# Patient Record
Sex: Male | Born: 1975 | Race: Black or African American | Hispanic: No | Marital: Single | Smoking: Never smoker
Health system: Southern US, Community
[De-identification: ages and names within clinical notes are randomized; demographics above are authoritative.]

## PROBLEM LIST (undated history)

## (undated) DIAGNOSIS — E119 Type 2 diabetes mellitus without complications: Secondary | ICD-10-CM

## (undated) DIAGNOSIS — K8020 Calculus of gallbladder without cholecystitis without obstruction: Secondary | ICD-10-CM

## (undated) DIAGNOSIS — E66813 Obesity, class 3: Secondary | ICD-10-CM

## (undated) DIAGNOSIS — F329 Major depressive disorder, single episode, unspecified: Secondary | ICD-10-CM

## (undated) DIAGNOSIS — F32A Depression, unspecified: Secondary | ICD-10-CM

## (undated) DIAGNOSIS — I10 Essential (primary) hypertension: Secondary | ICD-10-CM

## (undated) HISTORY — DX: Depression, unspecified: F32.A

## (undated) HISTORY — DX: Morbid (severe) obesity due to excess calories: E66.01

## (undated) HISTORY — DX: Calculus of gallbladder without cholecystitis without obstruction: K80.20

## (undated) HISTORY — DX: Obesity, class 3: E66.813

## (undated) HISTORY — DX: Essential (primary) hypertension: I10

## (undated) HISTORY — DX: Major depressive disorder, single episode, unspecified: F32.9

---

## 2007-06-15 ENCOUNTER — Emergency Department (HOSPITAL_COMMUNITY): Admission: EM | Admit: 2007-06-15 | Discharge: 2007-06-15 | Payer: Self-pay | Admitting: *Deleted

## 2012-09-20 ENCOUNTER — Encounter (HOSPITAL_COMMUNITY): Payer: Self-pay | Admitting: Emergency Medicine

## 2012-09-20 ENCOUNTER — Emergency Department (HOSPITAL_COMMUNITY)
Admission: EM | Admit: 2012-09-20 | Discharge: 2012-09-20 | Disposition: A | Discharge status: Home / Self Care | Payer: Self-pay | Source: Home / Self Care | Attending: Family Medicine | Admitting: Family Medicine

## 2012-09-20 DIAGNOSIS — IMO0001 Reserved for inherently not codable concepts without codable children: Secondary | ICD-10-CM

## 2012-09-20 DIAGNOSIS — J111 Influenza due to unidentified influenza virus with other respiratory manifestations: Secondary | ICD-10-CM

## 2012-09-20 MED ORDER — IPRATROPIUM BROMIDE 0.06 % NA SOLN: 2.0000 | Freq: Four times a day (QID) | NASAL | Status: DC

## 2012-09-20 MED ORDER — AZITHROMYCIN 250 MG PO TABS: ORAL_TABLET | ORAL | Status: DC

## 2012-09-20 NOTE — ED Notes (Signed)
Sore throat, hoarse, cough and no fever.  Light yellow phlegm with flakes of blood per patient.

## 2012-09-20 NOTE — ED Provider Notes (Signed)
History     CSN: [id number]  Arrival date & time 09/20/12  1538   First MD Initiated Contact with Patient 09/20/12 1551      Chief Complaint  Patient presents with  . Sore Throat    (Consider location/radiation/quality/duration/timing/severity/associated sxs/prior treatment) Patient is a 37 y.o. male presenting with pharyngitis. The history is provided by the patient.  Sore Throat This is a new problem. The current episode started 2 days ago. The problem has been gradually improving. Pertinent negatives include no chest pain and no abdominal pain. The symptoms are aggravated by swallowing (talking.).    History reviewed. No pertinent past medical history.  History reviewed. No pertinent past surgical history.  No family history on file.  History  Substance Use Topics  . Smoking status: Never Smoker   . Smokeless tobacco: Not on file  . Alcohol Use: Yes      Review of Systems  Constitutional: Negative.  Negative for fever.  HENT: Positive for congestion, sore throat, rhinorrhea, trouble swallowing and voice change. Negative for sinus pressure.   Respiratory: Negative for cough.   Cardiovascular: Negative for chest pain.  Gastrointestinal: Negative for abdominal pain.    Allergies  Keflex  Home Medications   Current Outpatient Rx  Name  Route  Sig  Dispense  Refill  . ibuprofen (ADVIL,MOTRIN) 200 MG tablet   Oral   Take 200 mg by mouth every 6 (six) hours as needed for pain.         Marland Kitchen OVER THE COUNTER MEDICATION      Throat lozenges         . azithromycin (ZITHROMAX Z-PAK) 250 MG tablet      Take as directed on pack   6 each   0   . ipratropium (ATROVENT) 0.06 % nasal spray   Nasal   Place 2 sprays into the nose 4 (four) times daily.   15 mL   1     BP 129/82  Pulse 66  Temp(Src) 98 F (36.7 C) (Oral)  Resp 20  SpO2 98%  Physical Exam  Nursing note and vitals reviewed. Constitutional: He is oriented to person, place, and time. He  appears well-developed and well-nourished.  HENT:  Head: Normocephalic.  Right Ear: External ear normal.  Left Ear: External ear normal.  Nose: Nose normal.  Mouth/Throat: Oropharynx is clear and moist.  Eyes: Conjunctivae are normal. Pupils are equal, round, and reactive to light.  Neck: Normal range of motion. Neck supple.  Lymphadenopathy:    He has no cervical adenopathy.  Neurological: He is alert and oriented to person, place, and time.  Skin: Skin is warm and dry.    ED Course  Procedures (including critical care time)  Labs Reviewed - No data to display No results found.   1. Influenza, laryngitis       MDM         Linna Hoff, MD 09/20/12 1735

## 2015-08-25 ENCOUNTER — Ambulatory Visit (INDEPENDENT_AMBULATORY_CARE_PROVIDER_SITE_OTHER): Payer: Managed Care, Other (non HMO)

## 2015-08-25 ENCOUNTER — Other Ambulatory Visit: Payer: Self-pay | Admitting: Family Medicine

## 2015-08-25 ENCOUNTER — Ambulatory Visit (INDEPENDENT_AMBULATORY_CARE_PROVIDER_SITE_OTHER): Payer: Managed Care, Other (non HMO) | Admitting: Family Medicine

## 2015-08-25 VITALS — BP 128/80 | HR 91 | Temp 98.8°F | Resp 16 | Ht 73.0 in | Wt 319.0 lb

## 2015-08-25 DIAGNOSIS — R1032 Left lower quadrant pain: Secondary | ICD-10-CM

## 2015-08-25 DIAGNOSIS — K5900 Constipation, unspecified: Secondary | ICD-10-CM

## 2015-08-25 DIAGNOSIS — E860 Dehydration: Secondary | ICD-10-CM

## 2015-08-25 LAB — POC MICROSCOPIC URINALYSIS (UMFC): MUCUS RE: ABSENT

## 2015-08-25 LAB — POCT CBC
GRANULOCYTE PERCENT: 67.1 % (ref 37–80)
HEMATOCRIT: 42.6 % — AB (ref 43.5–53.7)
Hemoglobin: 14.9 g/dL (ref 14.1–18.1)
Lymph, poc: 2.1 (ref 0.6–3.4)
MCH, POC: 29.7 pg (ref 27–31.2)
MCHC: 34.9 g/dL (ref 31.8–35.4)
MCV: 85.1 fL (ref 80–97)
MID (cbc): 0.6 (ref 0–0.9)
MPV: 7.1 fL (ref 0–99.8)
POC GRANULOCYTE: 5.4 (ref 2–6.9)
POC LYMPH %: 25.4 % (ref 10–50)
POC MID %: 7.5 % (ref 0–12)
Platelet Count, POC: 311 10*3/uL (ref 142–424)
RBC: 5 M/uL (ref 4.69–6.13)
RDW, POC: 13.9 %
WBC: 8.1 10*3/uL (ref 4.6–10.2)

## 2015-08-25 LAB — POCT URINALYSIS DIP (MANUAL ENTRY)
BILIRUBIN UA: NEGATIVE
Blood, UA: NEGATIVE
GLUCOSE UA: NEGATIVE
Leukocytes, UA: NEGATIVE
Nitrite, UA: NEGATIVE
Urobilinogen, UA: 0.2
pH, UA: 5.5

## 2015-08-25 LAB — POC HEMOCCULT BLD/STL (OFFICE/1-CARD/DIAGNOSTIC): FECAL OCCULT BLD: NEGATIVE

## 2015-08-25 MED ORDER — POLYETHYLENE GLYCOL 3350 17 GM/SCOOP PO POWD: 17.0000 g | Freq: Two times a day (BID) | ORAL | Status: DC

## 2015-08-25 NOTE — Patient Instructions (Addendum)
IF you received an x-ray today, you will receive an invoice from [facility]. Please contact [facility] at [phone] with questions or concerns regarding your invoice.   IF you received labwork today, you will receive an invoice from [employer]. Please contact [facility] at [phone] with questions or concerns regarding your invoice.   Our billing staff will not be able to assist you with questions regarding bills from these companies.  You will be contacted with the lab results as soon as they are available. The fastest way to get your results is to activate your My Chart account. Instructions are located on the last page of this paperwork. If you have not heard from [place] regarding the results in 2 weeks, please contact this office.  Dehydration, Adult Dehydration is a condition in which you do not have enough fluid or water in your body. It happens when you take in less fluid than you lose. Vital organs such as the kidneys, brain, and heart cannot function without a proper amount of fluids. Any loss of fluids from the body can cause dehydration.  Dehydration can range from mild to severe. This condition should be treated right away to help prevent it from becoming severe. CAUSES  This condition may be caused by:  Vomiting.  Diarrhea.  Excessive sweating, such as when exercising in hot or humid weather.  Not drinking enough fluid during strenuous exercise or during an illness.  Excessive urine output.  Fever.  Certain medicines. RISK FACTORS This condition is more likely to develop in:  People who are taking certain medicines that cause the body to lose excess fluid (diuretics).   People who have a chronic illness, such as diabetes, that may increase urination.  Older adults.   People who live at high altitudes.   People who participate in endurance sports.  SYMPTOMS  Mild Dehydration  Thirst.  Dry lips.  Slightly dry  mouth.  Dry, warm skin. Moderate Dehydration  Very dry mouth.   Muscle cramps.   Dark urine and decreased urine production.   Decreased tear production.   Headache.   Light-headedness, especially when you stand up from a sitting position.  Severe Dehydration  Changes in skin.   Cold and clammy skin.   Skin does not spring back quickly when lightly pinched and released.   Changes in body fluids.   Extreme thirst.   No tears.   Not able to sweat when body temperature is high, such as in hot weather.   Minimal urine production.   Changes in vital signs.   Rapid, weak pulse (more than 100 beats per minute when you are sitting still).   Rapid breathing.   Low blood pressure.   Other changes.   Sunken eyes.   Cold hands and feet.   Confusion.  Lethargy and difficulty being awakened.  Fainting (syncope).   Short-term weight loss.   Unconsciousness. DIAGNOSIS  This condition may be diagnosed based on your symptoms. You may also have tests to determine how severe your dehydration is. These tests may include:   Urine tests.   Blood tests.  TREATMENT  Treatment for this condition depends on the severity. Mild or moderate dehydration can often be treated at home. Treatment should be started right away. Do not wait until dehydration becomes severe. Severe dehydration needs to be treated at the hospital. Treatment for Mild Dehydration  Drinking plenty of water to replace the fluid you have lost.   Replacing minerals in your blood (  electrolytes) that you may have lost.  Treatment for Moderate Dehydration  Consuming oral rehydration solution (ORS). Treatment for Severe Dehydration  Receiving fluid through an IV tube.   Receiving electrolyte solution through a feeding tube that is passed through your nose and into your stomach (nasogastric tube or NG tube).  Correcting any abnormalities in electrolytes. HOME CARE INSTRUCTIONS    Drink enough fluid to keep your urine clear or pale yellow.   Drink water or fluid slowly by taking small sips. You can also try sucking on ice cubes.  Have food or beverages that contain electrolytes. Examples include bananas and sports drinks.  Take over-the-counter and prescription medicines only as told by your health care provider.   Prepare ORS according to the manufacturer's instructions. Take sips of ORS every 5 minutes until your urine returns to normal.  If you have vomiting or diarrhea, continue to try to drink water, ORS, or both.   If you have diarrhea, avoid:   Beverages that contain caffeine.   Fruit juice.   Milk.   Carbonated soft drinks.  Do not take salt tablets. This can lead to the condition of having too much sodium in your body (hypernatremia).  SEEK MEDICAL CARE IF:  You cannot eat or drink without vomiting.  You have had moderate diarrhea during a period of more than 24 hours.  You have a fever. SEEK IMMEDIATE MEDICAL CARE IF:   You have extreme thirst.  You have severe diarrhea.  You have not urinated in 6-8 hours, or you have urinated only a small amount of very dark urine.  You have shriveled skin.  You are dizzy, confused, or both.   This information is not intended to replace advice given to you by your health care provider. Make sure you discuss any questions you have with your health care provider.   Document Released: 06/02/2005 Document Revised: 02/21/2015 Document Reviewed: 10/18/2014 Elsevier Interactive Patient Education 2016 [employer]. Constipation, Adult Constipation is when a person has fewer than three bowel movements a week, has difficulty having a bowel movement, or has stools that are dry, hard, or larger than normal. As people grow older, constipation is more common. A low-fiber diet, not taking in enough fluids, and taking certain medicines may make constipation worse.  CAUSES   Certain medicines, such  as antidepressants, pain medicine, iron supplements, antacids, and water pills.   Certain diseases, such as diabetes, irritable bowel syndrome (IBS), thyroid disease, or depression.   Not drinking enough water.   Not eating enough fiber-rich foods.   Stress or travel.   Lack of physical activity or exercise.   Ignoring the urge to have a bowel movement.   Using laxatives too much.  SIGNS AND SYMPTOMS   Having fewer than three bowel movements a week.   Straining to have a bowel movement.   Having stools that are hard, dry, or larger than normal.   Feeling full or bloated.   Pain in the lower abdomen.   Not feeling relief after having a bowel movement.  DIAGNOSIS  Your health care provider will take a medical history and perform a physical exam. Further testing may be done for severe constipation. Some tests may include:  A barium enema X-ray to examine your rectum, colon, and, sometimes, your small intestine.   A sigmoidoscopy to examine your lower colon.   A colonoscopy to examine your entire colon. TREATMENT  Treatment will depend on the severity of your constipation and what is causing  it. Some dietary treatments include drinking more fluids and eating more fiber-rich foods. Lifestyle treatments may include regular exercise. If these diet and lifestyle recommendations do not help, your health care provider may recommend taking over-the-counter laxative medicines to help you have bowel movements. Prescription medicines may be prescribed if over-the-counter medicines do not work.  HOME CARE INSTRUCTIONS   Eat foods that have a lot of fiber, such as fruits, vegetables, whole grains, and beans.  Limit foods high in fat and processed sugars, such as french fries, hamburgers, cookies, candies, and soda.   A fiber supplement may be added to your diet if you cannot get enough fiber from foods.   Drink enough fluids to keep your urine clear or pale yellow.    Exercise regularly or as directed by your health care provider.   Go to the restroom when you have the urge to go. Do not hold it.   Only take over-the-counter or prescription medicines as directed by your health care provider. Do not take other medicines for constipation without talking to your health care provider first.  Estelle IF:   You have bright red blood in your stool.   Your constipation lasts for more than 4 days or gets worse.   You have abdominal or rectal pain.   You have thin, pencil-like stools.   You have unexplained weight loss. MAKE SURE YOU:   Understand these instructions.  Will watch your condition.  Will get help right away if you are not doing well or get worse.   This information is not intended to replace advice given to you by your health care provider. Make sure you discuss any questions you have with your health care provider.   Document Released: 02/29/2004 Document Revised: 06/23/2014 Document Reviewed: 03/14/2013 Elsevier Interactive Patient Education [employer].

## 2015-08-25 NOTE — Progress Notes (Signed)
Subjective:  By signing my name below, I, Marc Long, attest that this documentation has been prepared under the direction and in the presence of [name], MD.  Electronically Signed: [name], ED Scribe. 08/25/2015. 4:46 PM.   Patient ID: Marc Long, male    DOB: 1976-02-20, 40 y.o.   MRN: [id number]  HPI Chief Complaint  Patient presents with  . Abdominal Pain    x4 days  . Depression    HPI Comments: Marc Long is a 40 y.o. male who presents to the [facility] complaining of LLQ abdominal pain that began 4 days ago. He reports initially oily, now darker colored stools. He has associated left flank pain with deep breathes. Pt takes probiotic, multivitamins and a dietary supplement, but no medication. He denies hx of abdominal surgery,  He denies urinary symptoms, nausea, vomiting,  decrease appetite, indigestion, heart burn and constipation.    Past Medical History  Diagnosis Date  . Depression    History reviewed. No pertinent past surgical history. Allergies  Allergen Reactions  . Keflex [Cephalexin]   . Relafen [Nabumetone]     Blood in stool   Prior to Admission medications   Medication Sig Start Date End Date Taking? Authorizing Provider  ibuprofen (ADVIL,MOTRIN) 200 MG tablet Take 200 mg by mouth every 6 (six) hours as needed for pain.    Historical Provider, MD  OVER THE COUNTER MEDICATION Throat lozenges    Historical Provider, MD   Social History   Social History  . Marital Status: Single    Spouse Name: N/A  . Number of Children: N/A  . Years of Education: N/A   Occupational History  . Not on file.   Social History Main Topics  . Smoking status: Never Smoker   . Smokeless tobacco: Not on file  . Alcohol Use: Yes  . Drug Use: No  . Sexual Activity: Not on file   Other Topics Concern  . Not on file   Social History Narrative   Review of Systems  Constitutional: Positive for fatigue. Negative for fever, chills and appetite  change.  Respiratory: Positive for chest tightness. Negative for cough, shortness of breath and wheezing.   Gastrointestinal: Positive for abdominal pain. Negative for nausea, vomiting, diarrhea, constipation, blood in stool, abdominal distention, anal bleeding and rectal pain.  Genitourinary: Positive for flank pain. Negative for dysuria, urgency, frequency, hematuria, difficulty urinating and genital sores.  Skin: Negative for color change and rash.  Hematological: Negative for adenopathy.  Psychiatric/Behavioral: Negative for sleep disturbance.   Objective:   Physical Exam  Constitutional: He is oriented to person, place, and time. He appears well-developed and well-nourished. No distress.  HENT:  Head: Normocephalic and atraumatic.  Eyes: Conjunctivae and EOM are normal.  Neck: Neck supple.  Cardiovascular: Normal rate, regular rhythm and normal heart sounds.   Pulmonary/Chest: Effort normal.  Abdominal: Bowel sounds are normal. He exhibits mass. There is tenderness in the left upper quadrant and left lower quadrant.  Poorly defined mobile masses.   Genitourinary: Rectum normal and prostate normal. Rectal exam shows no external hemorrhoid, no tenderness and anal tone normal. Guaiac negative stool. Prostate is not enlarged and not tender.  Rectal exam normal. Normal prostates. No stool in vault. Normal rectal tone.   Musculoskeletal: Normal range of motion.  No CVA tenderness.   Neurological: He is alert and oriented to person, place, and time.  Skin: Skin is warm and dry.  Psychiatric: He has a normal  mood and affect. His behavior is normal.  Nursing note and vitals reviewed.  Filed Vitals:   08/25/15 1631  BP: 128/80  Pulse: 91  Temp: 98.8 F (37.1 C)  TempSrc: Oral  Resp: 16  Height: 6\' 1"  (1.854 m)  Weight: 319 lb (144.697 kg)  SpO2: 97%   Results for orders placed or performed in visit on 08/25/15  Comprehensive metabolic panel  Result Value Ref Range   Sodium 143  135 - 146 mmol/L   Potassium 4.6 3.5 - 5.3 mmol/L   Chloride 104 98 - 110 mmol/L   CO2 29 20 - 31 mmol/L   Glucose, Bld 80 65 - 99 mg/dL   BUN 12 7 - 25 mg/dL   Creat 1.29 0.60 - 1.35 mg/dL   Total Bilirubin 0.6 0.2 - 1.2 mg/dL   Alkaline Phosphatase 56 40 - 115 U/L   AST 22 10 - 40 U/L   ALT 29 9 - 46 U/L   Total Protein 7.7 6.1 - 8.1 g/dL   Albumin 4.3 3.6 - 5.1 g/dL   Calcium 9.6 8.6 - 10.3 mg/dL  POCT CBC  Result Value Ref Range   WBC 8.1 4.6 - 10.2 K/uL   Lymph, poc 2.1 0.6 - 3.4   POC LYMPH PERCENT 25.4 10 - 50 %L   MID (cbc) 0.6 0 - 0.9   POC MID % 7.5 0 - 12 %M   POC Granulocyte 5.4 2 - 6.9   Granulocyte percent 67.1 37 - 80 %G   RBC 5.00 4.69 - 6.13 M/uL   Hemoglobin 14.9 14.1 - 18.1 g/dL   HCT, POC 42.6 (A) 43.5 - 53.7 %   MCV 85.1 80 - 97 fL   MCH, POC 29.7 27 - 31.2 pg   MCHC 34.9 31.8 - 35.4 g/dL   RDW, POC 13.9 %   Platelet Count, POC 311 142 - 424 K/uL   MPV 7.1 0 - 99.8 fL  POCT urinalysis dipstick  Result Value Ref Range   Color, UA yellow yellow   Clarity, UA cloudy (A) clear   Glucose, UA negative negative   Bilirubin, UA Long (A) negative   Ketones, POC UA negative negative   Spec Grav, UA >=1.030    Blood, UA negative negative   pH, UA 5.5    Protein Ur, POC =30 (A) negative   Urobilinogen, UA 0.2    Nitrite, UA Negative Negative   Leukocytes, UA Negative Negative  POCT Microscopic Urinalysis ([facility])  Result Value Ref Range   WBC,UR,HPF,POC Few (A) None WBC/hpf   RBC,UR,HPF,POC None None RBC/hpf   Bacteria Few (A) None, Too numerous to count   Mucus Absent Absent   Epithelial Cells, UR Per Microscopy Few (A) None, Too numerous to count cells/hpf  POC Hemoccult Bld/Stl (1-Cd Office Dx)  Result Value Ref Range   Card #1 Date 08/25/2015    Fecal Occult Blood, POC Negative Negative   Dg Abd 1 View  08/25/2015  CLINICAL DATA:  Abdominal pain EXAM: ABDOMEN - 1 VIEW COMPARISON:  None. FINDINGS: Nonobstructive bowel gas pattern. Visualized  osseous structures are within normal limits. IMPRESSION: Unremarkable abdominal radiograph. Electronically Signed   By: Julian Hy M.D.   On: 08/25/2015 17:15    Assessment & Plan:   1. Abdominal pain, left lower quadrant   2. Constipation, unspecified constipation type   3. Dehydration   UA, HOC, cbc, cmp, and KUB all normal/neg.  Urine does appear very concentrated so I am suspicious  that symptoms maybe coming from some underlying constipation and dehydration. However, pt states he usually drinks much more water than he has today alone. Pt is taking an unknown supplement for weightloss which maybe contributing. Increase fluids and treat constipation, recheck in 3d, sooner if worse. If sxs cont, may need CT  Orders Placed This Encounter  Procedures  . Comprehensive metabolic panel  . POCT CBC  . POCT urinalysis dipstick  . POCT Microscopic Urinalysis ([facility])  . POC Hemoccult Bld/Stl (1-Cd Office Dx)    Meds ordered this encounter  Medications  . polyethylene glycol powder (GLYCOLAX/MIRALAX) powder    Sig: Take 17 g by mouth 2 (two) times daily.    Dispense:  225 g    Refill:  1    I personally performed the services described in this documentation, which was scribed in my presence. The recorded information has been reviewed and considered, and addended by me as needed.  [name], MD MPH

## 2015-08-26 LAB — COMPREHENSIVE METABOLIC PANEL
ALT: 29 U/L (ref 9–46)
AST: 22 U/L (ref 10–40)
Albumin: 4.3 g/dL (ref 3.6–5.1)
Alkaline Phosphatase: 56 U/L (ref 40–115)
BUN: 12 mg/dL (ref 7–25)
CHLORIDE: 104 mmol/L (ref 98–110)
CO2: 29 mmol/L (ref 20–31)
CREATININE: 1.29 mg/dL (ref 0.60–1.35)
Calcium: 9.6 mg/dL (ref 8.6–10.3)
GLUCOSE: 80 mg/dL (ref 65–99)
Potassium: 4.6 mmol/L (ref 3.5–5.3)
SODIUM: 143 mmol/L (ref 135–146)
Total Bilirubin: 0.6 mg/dL (ref 0.2–1.2)
Total Protein: 7.7 g/dL (ref 6.1–8.1)

## 2015-08-27 ENCOUNTER — Encounter: Payer: Self-pay | Admitting: Family Medicine

## 2015-08-28 ENCOUNTER — Ambulatory Visit (INDEPENDENT_AMBULATORY_CARE_PROVIDER_SITE_OTHER): Payer: Managed Care, Other (non HMO) | Admitting: Family Medicine

## 2015-08-28 ENCOUNTER — Ambulatory Visit (INDEPENDENT_AMBULATORY_CARE_PROVIDER_SITE_OTHER): Payer: Managed Care, Other (non HMO)

## 2015-08-28 VITALS — BP 126/82 | HR 82 | Temp 97.9°F | Resp 17 | Ht 72.0 in | Wt 324.0 lb

## 2015-08-28 DIAGNOSIS — R1032 Left lower quadrant pain: Secondary | ICD-10-CM

## 2015-08-28 DIAGNOSIS — R1013 Epigastric pain: Secondary | ICD-10-CM | POA: Diagnosis not present

## 2015-08-28 DIAGNOSIS — M25561 Pain in right knee: Secondary | ICD-10-CM | POA: Diagnosis not present

## 2015-08-28 LAB — POCT URINALYSIS DIP (MANUAL ENTRY)
BILIRUBIN UA: NEGATIVE
BILIRUBIN UA: NEGATIVE
Blood, UA: NEGATIVE
Glucose, UA: NEGATIVE
Leukocytes, UA: NEGATIVE
Nitrite, UA: NEGATIVE
PROTEIN UA: NEGATIVE
SPEC GRAV UA: 1.025
Urobilinogen, UA: 0.2
pH, UA: 5.5

## 2015-08-28 LAB — POC MICROSCOPIC URINALYSIS (UMFC): MUCUS RE: ABSENT

## 2015-08-28 MED ORDER — OMEPRAZOLE 40 MG PO CPDR: 40.0000 mg | DELAYED_RELEASE_CAPSULE | Freq: Every day | ORAL | Status: DC

## 2015-08-28 MED ORDER — GI COCKTAIL ~~LOC~~
30.0000 mL | Freq: Once | ORAL | Status: AC
  Administered 2015-08-28: 30 mL via ORAL

## 2015-08-28 MED ORDER — MELOXICAM 7.5 MG PO TABS: 7.5000 mg | ORAL_TABLET | Freq: Two times a day (BID) | ORAL | Status: DC | PRN

## 2015-08-28 NOTE — Patient Instructions (Addendum)
IF you received an x-ray today, you will receive an invoice from [facility]. Please contact [facility] at [phone] with questions or concerns regarding your invoice.   IF you received labwork today, you will receive an invoice from [employer]. Please contact [facility] at [phone] with questions or concerns regarding your invoice.   Our billing staff will not be able to assist you with questions regarding bills from these companies.  You will be contacted with the lab results as soon as they are available. The fastest way to get your results is to activate your My Chart account. Instructions are located on the last page of this paperwork. If you have not heard from [place] regarding the results in 2 weeks, please contact this office.  Full Liquid Diet A full liquid diet may be used:   To help you transition from a clear liquid diet to a soft diet.   When your body is healing and can only tolerate foods that are easy to digest.  Before or after certain a procedure, test, or surgery (such as stomach or intestinal surgeries).   If you have trouble swallowing or chewing.  A full liquid diet includes fluids and foods that are liquid or will become liquid at room temperature. The full liquid diet gives you the proteins, fluids, salts, and minerals that you need for energy. If you continue this diet for more than 72 hours, talk to your health care provider about how many calories you need to consume. If you continue the diet for more than 5 days, talk to your health care provider about taking a multivitamin or a nutritional supplement. WHAT DO I NEED TO KNOW ABOUT A FULL LIQUID DIET?  You may have any liquid.  You may have any food that becomes a liquid at room temperature. The food is considered a liquid if it can be poured off a spoon at room temperature.  Drink one serving of citrus or vitamin C-enriched fruit juice daily. WHAT FOODS CAN I  EAT? Grains Any grain food that can be pureed in soup (such as crackers, pasta, and rice). Hot cereal (such as farina or oatmeal) that has been blended. Talk to your health care provider or dietitian about these foods. Vegetables Pulp-free tomato or vegetable juice. Vegetables pureed in soup.  Fruits Fruit juice, including nectars and juices with pulp. Meats and Other Protein Sources Eggs in custard, eggnog mix, and eggs used in ice cream or pudding. Strained meats, like in baby food, may be allowed. Consult your health care provider.  Dairy Milk and milk-based beverages, including milk shakes and instant breakfast mixes. Smooth yogurt. Pureed cottage cheese. Avoid these foods if they are not well tolerated. Beverages All beverages, including liquid nutritional supplements. Ask your health care provider if you can have carbonated beverages. They may not be well tolerated. Condiments Iodized salt, pepper, spices, and flavorings. Cocoa powder. Vinegar, ketchup, yellow mustard, smooth sauces (such as hollandaise, cheese sauce, or white sauce), and soy sauce. Sweets and Desserts Custard, smooth pudding. Flavored gelatin. Tapioca, junket. Plain ice cream, sherbet, fruit ices. Frozen ice pops, frozen fudge pops, pudding pops, and other frozen bars with cream. Syrups, including chocolate syrup. Sugar, honey, jelly.  Fats and Oils Margarine, butter, cream, sour cream, and oils. Other Broth and cream soups. Strained, broth-based soups. The items listed above may not be a complete list of recommended foods or beverages. Contact your dietitian for more options.  WHAT FOODS CAN I NOT EAT? Grains  All breads. Grains are not allowed unless they are pureed into soup. Vegetables Vegetables are not allowed unless they are juiced, or cooked and pureed into soup. Fruits Fruits are not allowed unless they are juiced. Meats and Other Protein Sources Any meat or fish. Cooked or raw eggs. Nut butters.   Dairy Cheese.  Condiments Stone ground mustards. Fats and Oils Fats that are coarse or chunky. Sweets and Desserts Ice cream or other frozen desserts that have any solids in them or on top, such as nuts, chocolate chips, and pieces of cookies. Cakes. Cookies. Candy. Others Soups with chunks or pieces in them. The items listed above may not be a complete list of foods and beverages to avoid. Contact your dietitian for more information.   This information is not intended to replace advice given to you by your health care provider. Make sure you discuss any questions you have with your health care provider.   Document Released: 06/02/2005 Document Revised: 06/07/2013 Document Reviewed: 04/07/2013 Elsevier Interactive Patient Education 2016 Elsevier Inc. Low-Fiber Diet Fiber is found in fruits, vegetables, and whole grains. A low-fiber diet restricts fibrous foods that are not digested in the small intestine. A diet containing about 10-15 grams of fiber per day is considered low fiber. Low-fiber diets may be used to:  Promote healing and rest the bowel during intestinal flare-ups.  Prevent blockage of a partially obstructed or narrowed gastrointestinal tract.  Reduce fecal weight and volume.  Slow the movement of feces. You may be on a low-fiber diet as a transitional diet following surgery, after an injury (trauma), or because of a short (acute) or lifelong (chronic) illness. Your health care provider will determine the length of time you need to stay on this diet.  WHAT DO I NEED TO KNOW ABOUT A LOW-FIBER DIET? Always check the fiber content on the packaging's Nutrition Facts label, especially on foods from the grains list. Ask your dietitian if you have questions about specific foods that are related to your condition, especially if the food is not listed below. In general, a low-fiber food will have less than 2 g of fiber. WHAT FOODS CAN I EAT? Grains All breads and crackers made  with white flour. Sweet rolls, doughnuts, waffles, pancakes, [place] toast, bagels. Pretzels, Melba toast, zwieback. Well-cooked cereals, such as cornmeal, farina, or cream cereals. Dry cereals that do not contain whole grains, fruit, or nuts, such as refined corn, wheat, rice, and oat cereals. Potatoes prepared any way without skins, plain pastas and noodles, refined white rice. Use white flour for baking and making sauces. Use allowed list of grains for casseroles, dumplings, and puddings.  Vegetables Strained tomato and vegetable juices. Fresh lettuce, cucumber, spinach. Well-cooked (no skin or pulp) or canned vegetables, such as asparagus, bean sprouts, beets, carrots, green beans, mushrooms, potatoes, pumpkin, spinach, yellow squash, tomato sauce/puree, turnips, yams, and zucchini. Keep servings limited to  cup.  Fruits All fruit juices except prune juice. Cooked or canned fruits without skin and seeds, such as applesauce, apricots, cherries, fruit cocktail, grapefruit, grapes, mandarin oranges, melons, peaches, pears, pineapple, and plums. Fresh fruits without skin, such as apricots, avocados, bananas, melons, pineapple, nectarines, and peaches. Keep servings limited to  cup or 1 piece.  Meat and Other Protein Sources Ground or well-cooked tender beef, ham, veal, lamb, pork, or poultry. Eggs, plain cheese. Fish, oysters, shrimp, lobster, and other seafood. Liver, organ meats. Smooth nut butters. Dairy All milk products and alternative dairy substitutes, such as soy, rice,  almond, and coconut, not containing added whole nuts, seeds, or added fruit. Beverages Decaf coffee, fruit, and vegetable juices or smoothies (small amounts, with no pulp or skins, and with fruits from allowed list), sports drinks, herbal tea. Condiments Ketchup, mustard, vinegar, cream sauce, cheese sauce, cocoa powder. Spices in moderation, such as allspice, basil, bay leaves, celery powder or leaves, cinnamon, cumin powder,  curry powder, ginger, mace, marjoram, onion or garlic powder, oregano, paprika, parsley flakes, ground pepper, rosemary, sage, savory, tarragon, thyme, and turmeric. Sweets and Desserts Plain cakes and cookies, pie made with allowed fruit, pudding, custard, cream pie. Gelatin, fruit, ice, sherbet, frozen ice pops. Ice cream, ice milk without nuts. Plain hard candy, honey, jelly, molasses, syrup, sugar, chocolate syrup, gumdrops, marshmallows. Limit overall sugar intake.  Fats and Oil Margarine, butter, cream, mayonnaise, salad oils, plain salad dressings made from allowed foods. Choose healthy fats such as olive oil, canola oil, and omega-3 fatty acids (such as found in salmon or tuna) when possible.  Other Bouillon, broth, or cream soups made from allowed foods. Any strained soup. Casseroles or mixed dishes made with allowed foods. The items listed above may not be a complete list of recommended foods or beverages. Contact your dietitian for more options.  WHAT FOODS ARE NOT RECOMMENDED? Grains All whole wheat and whole grain breads and crackers. Multigrains, rye, bran seeds, nuts, or coconut. Cereals containing whole grains, multigrains, bran, coconut, nuts, raisins. Cooked or dry oatmeal, steel-cut oats. Coarse wheat cereals, granola. Cereals advertised as high fiber. Potato skins. Whole grain pasta, wild or brown rice. Popcorn. Coconut flour. Bran, buckwheat, corn bread, multigrains, rye, wheat germ.  Vegetables Fresh, cooked or canned vegetables, such as artichokes, asparagus, beet greens, broccoli, Brussels sprouts, cabbage, celery, cauliflower, corn, eggplant, kale, legumes or beans, okra, peas, and tomatoes. Avoid large servings of any vegetables, especially raw vegetables.  Fruits Fresh fruits, such as apples with or without skin, berries, cherries, figs, grapes, grapefruit, guavas, kiwis, mangoes, oranges, papayas, pears, persimmons, pineapple, and pomegranate. Prune juice and juices with  pulp, stewed or dried prunes. Dried fruits, dates, raisins. Fruit seeds or skins. Avoid large servings of all fresh fruits. Meats and Other Protein Sources Tough, fibrous meats with gristle. Chunky nut butter. Cheese made with seeds, nuts, or other foods not recommended. Nuts, seeds, legumes (beans, including baked beans), dried peas, beans, lentils.  Dairy Yogurt or cheese that contains nuts, seeds, or added fruit.  Beverages Fruit juices with high pulp, prune juice. Caffeinated coffee and teas.  Condiments Coconut, maple syrup, pickles, olives. Sweets and Desserts Desserts, cookies, or candies that contain nuts or coconut, chunky peanut butter, dried fruits. Jams, preserves with seeds, marmalade. Large amounts of sugar and sweets. Any other dessert made with fruits from the not recommended list.  Other Soups made from vegetables that are not recommended or that contain other foods not recommended.  The items listed above may not be a complete list of foods and beverages to avoid. Contact your dietitian for more information.   This information is not intended to replace advice given to you by your health care provider. Make sure you discuss any questions you have with your health care provider.   Document Released: 11/22/2001 Document Revised: 06/07/2013 Document Reviewed: 04/25/2013 Elsevier Interactive Patient Education [employer].

## 2015-08-28 NOTE — Progress Notes (Signed)
Subjective:    Patient ID: Marc Long, male    DOB: Mar 30, 1976, 40 y.o.   MRN: [id number] By signing my name below, I, Marc Long, attest that this documentation has been prepared under the direction and in the presence of [name], MD.  Electronically Signed: Zola Long, Medical Scribe. 08/28/2015. 3:56 PM.  Chief Complaint  Patient presents with  . Follow-up    Same pain from previously, feeling of fullness  . Knee Pain    Right, X 1 month    HPI HPI Comments: Marc Long is a 40 y.o. male who presents to the [facility] for a follow-up for abdominal pain/fullness. Patient was last seen by me 3 days ago for LLQ abdominal pain and oily, dark colored stools. The pain has improved some, but has not resolved. His stools are not watery anymore, but they are still abnormal, described as "light brown and squishy." He is currently feeling gassy. He ate some pizza yesterday and states he was still feeling full 7 hours later. Patient has also been belching and notes he was coughing up bile this morning. Patient denies abdominal surgeries.  Patient also complains of right knee pain that started about 1 month ago. He has had problems in the knee before, but notes the pain worsened after he jumped from a small cliff-like area. When he gets down, he feels like he is putting weight on a nerve in the knee. He reports having associated numbness/tingling. The pain does not shoot when he is standing. Patient denies weakness. He also denies prior knee injuries. The knee has not locked up or given out.   Past Medical History  Diagnosis Date  . Depression    No past surgical history on file. Current Outpatient Prescriptions on File Prior to Visit  Medication Sig Dispense Refill  . polyethylene glycol powder (GLYCOLAX/MIRALAX) powder Take 17 g by mouth 2 (two) times daily. 225 g 1   No current facility-administered medications on file prior to visit.   Allergies  Allergen Reactions   . Keflex [Cephalexin]   . Relafen [Nabumetone]     Blood in stool   No family history on file. Social History   Social History  . Marital Status: Single    Spouse Name: N/A  . Number of Children: N/A  . Years of Education: N/A   Social History Main Topics  . Smoking status: Never Smoker   . Smokeless tobacco: None  . Alcohol Use: Yes  . Drug Use: No  . Sexual Activity: Not Asked   Other Topics Concern  . None   Social History Narrative   Depression screen [facility] 2/9 08/25/2015  Decreased Interest 2  Down, Depressed, Hopeless 2  PHQ - 2 Score 4  Altered sleeping 0  Tired, decreased energy 2  Change in appetite 2  Feeling bad or failure about yourself  3  Trouble concentrating 0  Moving slowly or fidgety/restless 1  Suicidal thoughts 3  PHQ-9 Score 15  Difficult doing work/chores Not difficult at all      Review of Systems  Constitutional: Positive for activity change, appetite change and fatigue.  Respiratory: Positive for cough.   Gastrointestinal: Positive for nausea, vomiting, abdominal pain, diarrhea, constipation, blood in stool and abdominal distention. Negative for anal bleeding.  Genitourinary: Negative for dysuria, urgency, decreased urine volume, difficulty urinating and testicular pain.  Musculoskeletal: Positive for arthralgias.  Neurological: Positive for numbness. Negative for weakness.  Psychiatric/Behavioral: Positive for suicidal ideas and  dysphoric mood. Negative for sleep disturbance and decreased concentration. The patient is nervous/anxious.        Objective:  BP 126/82 mmHg  Pulse 82  Temp(Src) 97.9 F (36.6 C) (Oral)  Resp 17  Ht 6' (1.829 m)  Wt 324 lb (146.965 kg)  BMI 43.93 kg/m2  SpO2 97%  Physical Exam  Constitutional: He is oriented to person, place, and time. He appears well-developed and well-nourished. No distress.  HENT:  Head: Normocephalic and atraumatic.  Mouth/Throat: Oropharynx is clear and moist. No oropharyngeal  exudate.  Eyes: Pupils are equal, round, and reactive to light.  Neck: Neck supple.  Cardiovascular: Normal rate.   Pulmonary/Chest: Effort normal.  Abdominal: There is tenderness.  Bowel sounds normal except for RUQ, which sounds tympanitic. Tenderness to LLQ and epigastric area.  Musculoskeletal: He exhibits no edema.  No pain along medial joint line. Some mild pain at lateral joint line. Patella intact. No crepitus, full ROM.  Neurological: He is alert and oriented to person, place, and time. No cranial nerve deficit.  Skin: Skin is warm and dry. No rash noted.  Psychiatric: He has a normal mood and affect. His behavior is normal.  Nursing note and vitals reviewed.  Results for orders placed or performed in visit on 08/28/15  POCT urinalysis dipstick  Result Value Ref Range   Color, UA yellow yellow   Clarity, UA clear clear   Glucose, UA negative negative   Bilirubin, UA negative negative   Ketones, POC UA negative negative   Spec Grav, UA 1.025    Blood, UA negative negative   pH, UA 5.5    Protein Ur, POC negative negative   Urobilinogen, UA 0.2    Nitrite, UA Negative Negative   Leukocytes, UA Negative Negative  POCT Microscopic Urinalysis ([facility])  Result Value Ref Range   WBC,UR,HPF,POC None None WBC/hpf   RBC,UR,HPF,POC None None RBC/hpf   Bacteria Few (A) None, Too numerous to count   Mucus Absent Absent   Epithelial Cells, UR Per Microscopy Few (A) None, Too numerous to count cells/hpf   Dg Knee 1-2 Views Right  08/28/2015  CLINICAL DATA:  Pain over lateral joint line after jumping injury several months ago. EXAM: RIGHT KNEE - 1-2 VIEW COMPARISON:  None. FINDINGS: AP and lateral views. No acute fracture or dislocation. Joint spaces are maintained for age. No joint effusion. IMPRESSION: No acute osseous abnormality. Electronically Signed   By: Abigail Miyamoto M.D.   On: 08/28/2015 17:17   Dg Abd 1 View  08/28/2015  CLINICAL DATA:  Follow-up for left lower quadrant  abdominal pain. EXAM: ABDOMEN - 1 VIEW COMPARISON:  Plain film of the abdomen dated 08/25/2015. FINDINGS: Visualized bowel gas pattern is nonobstructive. No evidence of soft tissue mass or abnormal fluid collection. No evidence of free intraperitoneal air. No pathologic- appearing calcifications seen. Osseous structures are unremarkable. IMPRESSION: Negative. Electronically Signed   By: Franki Cabot M.D.   On: 08/28/2015 16:36   Dg Abd 1 View  08/25/2015  CLINICAL DATA:  Abdominal pain EXAM: ABDOMEN - 1 VIEW COMPARISON:  None. FINDINGS: Nonobstructive bowel gas pattern. Visualized osseous structures are within normal limits. IMPRESSION: Unremarkable abdominal radiograph. Electronically Signed   By: Julian Hy M.D.   On: 08/25/2015 17:15       Assessment & Plan:   1. Abdominal pain, left lower quadrant - minimal sx improvement with miralax cleanout despite it being technically successful.  Same with GI cocktail in office today. Need CT  scan asap to eval for poss diverticulitis as cause of sxs. In the meantime, cont on full liquid diet and may slowly progress to low residue diet if sxs resolve.  2. Abdominal pain, epigastric   3. Knee pain, acute, right - mild OA/overuse - placed in brace with meloxicam    PT admits to SI on routine questioning - I did not see this while pt was in the office - need to discuss at f/u.  Orders Placed This Encounter  Procedures  . DG Knee 1-2 Views Right    Standing Status: Future     Number of Occurrences: 1     Standing Expiration Date: 08/27/2016    Order Specific Question:  Reason for Exam (SYMPTOM  OR DIAGNOSIS REQUIRED)    Answer:  pain over lateral joint line after jumping off of a small wall sev mos ago    Order Specific Question:  Preferred imaging location?    Answer:  External  . DG Abd 1 View    Standing Status: Future     Number of Occurrences: 1     Standing Expiration Date: 08/27/2016    Order Specific Question:  Reason for Exam (SYMPTOM   OR DIAGNOSIS REQUIRED)    Answer:  continued early satiety, regurg, bloating    Order Specific Question:  Preferred imaging location?    Answer:  External  . CT Abdomen Pelvis W Contrast    [facility]-323/NOT DIAB/NO KIDNEY PROBLEMS/NO NEEDS/NKDA TO CT IV [facility],EPIC ORDER     Standing Status: Future     Number of Occurrences:      Standing Expiration Date: 11/27/2016    Order Specific Question:  If indicated for the ordered procedure, I authorize the administration of contrast media per Radiology protocol    Answer:  Yes    Order Specific Question:  Reason for Exam (SYMPTOM  OR DIAGNOSIS REQUIRED)    Answer:  persistent left lower quandrant abdominal pain    Order Specific Question:  Preferred imaging location?    Answer:  [facility]  . POCT urinalysis dipstick  . POCT Microscopic Urinalysis ([facility])    Meds ordered this encounter  Medications  . gi cocktail (Maalox,Lidocaine,Donnatal)    Sig:   . meloxicam (MOBIC) 7.5 MG tablet    Sig: Take 1 tablet (7.5 mg total) by mouth 2 (two) times daily as needed for pain.    Dispense:  60 tablet    Refill:  1  . omeprazole (PRILOSEC) 40 MG capsule    Sig: Take 1 capsule (40 mg total) by mouth daily.    Dispense:  30 capsule    Refill:  1    I personally performed the services described in this documentation, which was scribed in my presence. The recorded information has been reviewed and considered, and addended by me as needed.  [name], MD MPH

## 2015-08-30 ENCOUNTER — Telehealth: Payer: Self-pay

## 2015-08-30 NOTE — Telephone Encounter (Signed)
I am out of the office and will not have the papers done until Mon 3/27 a.m. - if they are needed before then, please see if perhaps one of my colleagues can complete them on my behalf, thanks.

## 2015-08-30 NOTE — Telephone Encounter (Signed)
Patient needs FMLA forms completed by Dr Brigitte Pulse. I have filled out what I can from the [place] notes and highlighted the areas that need to completed. I will place in your box on 08/30/15 please fill out and return to the FMLA/Disability box at the [employer] desk within 5-7 business days. Thank You!!

## 2015-08-31 NOTE — Telephone Encounter (Signed)
The 27th will be fine, the papers are in your box in a folder!!

## 2015-09-05 ENCOUNTER — Ambulatory Visit
Admission: RE | Admit: 2015-09-05 | Discharge: 2015-09-05 | Disposition: A | Discharge status: Home / Self Care | Payer: Managed Care, Other (non HMO) | Source: Ambulatory Visit | Attending: Family Medicine | Admitting: Family Medicine

## 2015-09-05 DIAGNOSIS — R1032 Left lower quadrant pain: Secondary | ICD-10-CM

## 2015-09-05 DIAGNOSIS — R1013 Epigastric pain: Secondary | ICD-10-CM

## 2015-09-05 MED ORDER — IOPAMIDOL (ISOVUE-300) INJECTION 61%
100.0000 mL | Freq: Once | INTRAVENOUS | Status: AC | PRN
  Administered 2015-09-05: 100 mL via INTRAVENOUS

## 2015-09-07 ENCOUNTER — Ambulatory Visit (INDEPENDENT_AMBULATORY_CARE_PROVIDER_SITE_OTHER): Payer: Managed Care, Other (non HMO) | Admitting: Family Medicine

## 2015-09-07 VITALS — BP 114/80 | HR 72 | Temp 97.6°F | Resp 16 | Ht 72.0 in | Wt 323.0 lb

## 2015-09-07 DIAGNOSIS — K5730 Diverticulosis of large intestine without perforation or abscess without bleeding: Secondary | ICD-10-CM | POA: Diagnosis not present

## 2015-09-07 DIAGNOSIS — K8021 Calculus of gallbladder without cholecystitis with obstruction: Secondary | ICD-10-CM | POA: Diagnosis not present

## 2015-09-07 DIAGNOSIS — R1032 Left lower quadrant pain: Secondary | ICD-10-CM

## 2015-09-07 LAB — POCT CBC
Granulocyte percent: 71 %G (ref 37–80)
HEMATOCRIT: 43.5 % (ref 43.5–53.7)
HEMOGLOBIN: 14.9 g/dL (ref 14.1–18.1)
LYMPH, POC: 1.6 (ref 0.6–3.4)
MCH: 29.7 pg (ref 27–31.2)
MCHC: 34.3 g/dL (ref 31.8–35.4)
MCV: 86.7 fL (ref 80–97)
MID (cbc): 0.4 (ref 0–0.9)
MPV: 7.6 fL (ref 0–99.8)
POC GRANULOCYTE: 5.1 (ref 2–6.9)
POC LYMPH PERCENT: 22.8 %L (ref 10–50)
POC MID %: 6.2 % (ref 0–12)
Platelet Count, POC: 302 10*3/uL (ref 142–424)
RBC: 5.02 M/uL (ref 4.69–6.13)
RDW, POC: 12.9 %
WBC: 7.2 10*3/uL (ref 4.6–10.2)

## 2015-09-07 LAB — POCT URINALYSIS DIP (MANUAL ENTRY)
Bilirubin, UA: NEGATIVE
Glucose, UA: NEGATIVE
NITRITE UA: NEGATIVE
PH UA: 5.5
RBC UA: NEGATIVE
Spec Grav, UA: 1.025
Urobilinogen, UA: 0.2

## 2015-09-07 LAB — POC MICROSCOPIC URINALYSIS (UMFC)

## 2015-09-07 LAB — POCT SEDIMENTATION RATE: POCT SED RATE: 36 mm/h — AB (ref 0–22)

## 2015-09-07 MED ORDER — TRAMADOL HCL 50 MG PO TABS
50.0000 mg | ORAL_TABLET | Freq: Four times a day (QID) | ORAL | Status: DC | PRN
Start: 2015-09-07 — End: 2015-09-15

## 2015-09-07 MED ORDER — CIPROFLOXACIN HCL 750 MG PO TABS: 750.0000 mg | ORAL_TABLET | Freq: Two times a day (BID) | ORAL | Status: DC

## 2015-09-07 MED ORDER — METRONIDAZOLE 500 MG PO TABS: 500.0000 mg | ORAL_TABLET | Freq: Four times a day (QID) | ORAL | Status: DC

## 2015-09-07 NOTE — Telephone Encounter (Signed)
Completed while pt is in office

## 2015-09-07 NOTE — Progress Notes (Signed)
Subjective:    Patient ID: Marc Long, male    DOB: 06-03-1976, 40 y.o.   MRN: [id number] By signing my name below, I, [name], attest that this documentation has been prepared under the direction and in the presence of [name], MD.  Electronically Signed: [name], Medical Scribe. 09/07/2015. 3:39 PM.  Chief Complaint  Patient presents with  . Abdominal Pain  . Follow-up    HPI HPI Comments: Marc Long is a 40 y.o. male who presents to the [facility] for a follow-up for intermittent, sharp, LLQ abdominal pain that began 17 days ago. The pain has not improved and is worse when sitting up or laying down. The pain is worse during the day than at night and sometimes radiates to his back. Episodes of pain can last for 1 minute up to 45-60 minutes (waxing and waning). The pain is improved after bowel movements. Patient had a CT Abdomen Pelvis 2 days ago which revealed cholelithiasis (3.7 cm lamellated single gallstone) and colon diverticulosis without acute diverticulitis. He is taking Miralax once a day now. Patient denies blood in stool, fever, chills, nausea, vomiting, and urinary problems. His stool has been a light brown color. He has been out of work since 3/12. His appetite has been fine.  Past Medical History  Diagnosis Date  . Depression    History reviewed. No pertinent past surgical history. Current Outpatient Prescriptions on File Prior to Visit  Medication Sig Dispense Refill  . meloxicam (MOBIC) 7.5 MG tablet Take 1 tablet (7.5 mg total) by mouth 2 (two) times daily as needed for pain. 60 tablet 1  . omeprazole (PRILOSEC) 40 MG capsule Take 1 capsule (40 mg total) by mouth daily. 30 capsule 1  . polyethylene glycol powder (GLYCOLAX/MIRALAX) powder Take 17 g by mouth 2 (two) times daily. 225 g 1   No current facility-administered medications on file prior to visit.   Allergies  Allergen Reactions  . Keflex [Cephalexin]   . Relafen [Nabumetone]       Blood in stool   History reviewed. No pertinent family history. Social History   Social History  . Marital Status: Single    Spouse Name: N/A  . Number of Children: N/A  . Years of Education: N/A   Social History Main Topics  . Smoking status: Never Smoker   . Smokeless tobacco: Never Used  . Alcohol Use: 0.0 oz/week    0 Standard drinks or equivalent per week  . Drug Use: No  . Sexual Activity: Not Asked   Other Topics Concern  . None   Social History Narrative    Review of Systems  Constitutional: Positive for activity change and appetite change. Negative for fever, chills, fatigue and unexpected weight change.  Respiratory: Negative for cough, chest tightness and shortness of breath.   Gastrointestinal: Positive for abdominal pain and constipation. Negative for nausea, vomiting, diarrhea, blood in stool, abdominal distention, anal bleeding and rectal pain.  Genitourinary: Negative.   Musculoskeletal: Positive for back pain.  Skin: Negative for rash and wound.  Allergic/Immunologic: Negative for immunocompromised state.  Neurological: Negative for dizziness, light-headedness and headaches.  Hematological: Negative for adenopathy.  Psychiatric/Behavioral: Positive for sleep disturbance.       Objective:  BP 114/80 mmHg  Pulse 72  Temp(Src) 97.6 F (36.4 C) (Oral)  Resp 16  Ht 6' (1.829 m)  Wt 323 lb (146.512 kg)  BMI 43.80 kg/m2  SpO2 98%  Physical Exam  Constitutional: He  is oriented to person, place, and time. He appears well-developed and well-nourished. No distress.  HENT:  Head: Normocephalic and atraumatic.  Mouth/Throat: Oropharynx is clear and moist. No oropharyngeal exudate.  Eyes: Pupils are equal, round, and reactive to light.  Neck: Neck supple.  Cardiovascular: Normal rate.   Pulmonary/Chest: Effort normal and breath sounds normal. No respiratory distress. He has no wheezes. He has no rales.  Clear to auscultation bilaterally. Good air  movement.   Abdominal: Bowel sounds are normal. There is tenderness in the left lower quadrant. There is no tenderness at McBurney's point and negative Murphy's sign.  TTP localizes to left lower quadrant.  Musculoskeletal: He exhibits no edema.  Neurological: He is alert and oriented to person, place, and time. No cranial nerve deficit.  Skin: Skin is warm and dry. No rash noted.  Psychiatric: He has a normal mood and affect. His behavior is normal.  Nursing note and vitals reviewed.       Assessment & Plan:   1. Calculus of gallbladder with biliary obstruction but without cholecystitis - refer to gen surg - unlikely that 3.7 cm gallstone has anything to do w/ pt's presenting and continuing c/o LLQ pain.    2. Abdominal pain, left lower quadrant - no etiology seen on CT scan so will refer to GI and start empiric trial for infectious colitis.  3. Diverticulosis of colon without hemorrhage     Orders Placed This Encounter  Procedures  . Urine culture  . Comprehensive metabolic panel  . Ambulatory referral to General Surgery    Referral Priority:  Urgent    Referral Type:  Surgical    Referral Reason:  Specialty Services Required    Requested Specialty:  General Surgery    Number of Visits Requested:  1  . POCT urinalysis dipstick  . POCT Microscopic Urinalysis ([facility])  . POCT CBC  . POCT SEDIMENTATION RATE    Meds ordered this encounter  Medications  . Probiotic Product (PROBIOTIC PO)    Sig: Take by mouth.  .  traMADol (ULTRAM) 50 MG tablet    Sig: Take 1 tablet (50 mg total) by mouth every 6 (six) hours as needed.    Dispense:  30 tablet    Refill:  1  . ciprofloxacin (CIPRO) 750 MG tablet    Sig: Take 1 tablet (750 mg total) by mouth 2 (two) times daily.    Dispense:  14 tablet    Refill:  0  . metroNIDAZOLE (FLAGYL) 500 MG tablet    Sig: Take 1 tablet (500 mg total) by mouth 4 (four) times daily.    Dispense:  28 tablet    Refill:  0    I personally performed  the services described in this documentation, which was scribed in my presence. The recorded information has been reviewed and considered, and addended by me as needed.  [name], MD MPH  Results for orders placed or performed in visit on 09/07/15  Urine culture  Result Value Ref Range   Colony Count NO GROWTH    Organism ID, Bacteria NO GROWTH   Comprehensive metabolic panel  Result Value Ref Range   Sodium 141 135 - 146 mmol/L   Potassium 4.3 3.5 - 5.3 mmol/L   Chloride 104 98 - 110 mmol/L   CO2 23 20 - 31 mmol/L   Glucose, Bld 91 65 - 99 mg/dL   BUN 10 7 - 25 mg/dL   Creat 1.19 0.60 - 1.35 mg/dL  Total Bilirubin 0.5 0.2 - 1.2 mg/dL   Alkaline Phosphatase 54 40 - 115 U/L   AST 29 10 - 40 U/L   ALT 35 9 - 46 U/L   Total Protein 7.6 6.1 - 8.1 g/dL   Albumin 4.3 3.6 - 5.1 g/dL   Calcium 9.0 8.6 - 10.3 mg/dL  POCT urinalysis dipstick  Result Value Ref Range   Color, UA yellow yellow   Clarity, UA hazy (A) clear   Glucose, UA negative negative   Bilirubin, UA negative negative   Ketones, POC UA trace (5) (A) negative   Spec Grav, UA 1.025    Blood, UA negative negative   pH, UA 5.5    Protein Ur, POC =30 (A) negative   Urobilinogen, UA 0.2    Nitrite, UA Negative Negative   Leukocytes, UA Trace (A) Negative  POCT Microscopic Urinalysis ([facility])  Result Value Ref Range   WBC,UR,HPF,POC Moderate (A) None WBC/hpf   RBC,UR,HPF,POC None None RBC/hpf   Bacteria Few (A) None, Too numerous to count   Mucus Present (A) Absent   Epithelial Cells, UR Per Microscopy Few (A) None, Too numerous to count cells/hpf  POCT CBC  Result Value Ref Range   WBC 7.2 4.6 - 10.2 K/uL   Lymph, poc 1.6 0.6 - 3.4   POC LYMPH PERCENT 22.8 10 - 50 %L   MID (cbc) 0.4 0 - 0.9   POC MID % 6.2 0 - 12 %M   POC Granulocyte 5.1 2 - 6.9   Granulocyte percent 71.0 37 - 80 %G   RBC 5.02 4.69 - 6.13 M/uL   Hemoglobin 14.9 14.1 - 18.1 g/dL   HCT, POC 43.5 43.5 - 53.7 %   MCV 86.7 80 - 97 fL   MCH, POC  29.7 27 - 31.2 pg   MCHC 34.3 31.8 - 35.4 g/dL   RDW, POC 12.9 %   Platelet Count, POC 302 142 - 424 K/uL   MPV 7.6 0 - 99.8 fL  POCT SEDIMENTATION RATE  Result Value Ref Range   POCT SED RATE 36 (A) 0 - 22 mm/hr

## 2015-09-07 NOTE — Patient Instructions (Addendum)
IF you received an x-ray today, you will receive an invoice from [facility]. Please contact [facility] at [phone] with questions or concerns regarding your invoice.   IF you received labwork today, you will receive an invoice from [employer]. Please contact [facility] at [phone] with questions or concerns regarding your invoice.   Our billing staff will not be able to assist you with questions regarding bills from these companies.  You will be contacted with the lab results as soon as they are available. The fastest way to get your results is to activate your My Chart account. Instructions are located on the last page of this paperwork. If you have not heard from [place] regarding the results in 2 weeks, please contact this office.    Cholelithiasis Cholelithiasis (also called gallstones) is a form of gallbladder disease in which gallstones form in your gallbladder. The gallbladder is an organ that stores bile made in the liver, which helps digest fats. Gallstones begin as small crystals and slowly grow into stones. Gallstone pain occurs when the gallbladder spasms and a gallstone is blocking the duct. Pain can also occur when a stone passes out of the duct.  RISK FACTORS  Being male.   Having multiple pregnancies. Health care providers sometimes advise removing diseased gallbladders before future pregnancies.   Being obese.  Eating a diet heavy in fried foods and fat.   Being older than 75 years and increasing age.   Prolonged use of medicines containing male hormones.   Having diabetes mellitus.   Rapidly losing weight.   Having a family history of gallstones (heredity).  SYMPTOMS  Nausea.   Vomiting.  Abdominal pain.   Yellowing of the skin (jaundice).   Sudden pain. It may persist from several minutes to several hours.  Fever.   Tenderness to the touch. In some cases, when gallstones do not move  into the bile duct, people have no pain or symptoms. These are called "silent" gallstones.  TREATMENT Silent gallstones do not need treatment. In severe cases, emergency surgery may be required. Options for treatment include:  Surgery to remove the gallbladder. This is the most common treatment.  Medicines. These do not always work and may take 6-12 months or more to work.  Shock wave treatment (extracorporeal biliary lithotripsy). In this treatment an ultrasound machine sends shock waves to the gallbladder to break gallstones into smaller pieces that can pass into the intestines or be dissolved by medicine. HOME CARE INSTRUCTIONS   Only take over-the-counter or prescription medicines for pain, discomfort, or fever as directed by your health care provider.   Follow a low-fat diet until seen again by your health care provider. Fat causes the gallbladder to contract, which can result in pain.   Follow up with your health care provider as directed. Attacks are almost always recurrent and surgery is usually required for permanent treatment.  SEEK IMMEDIATE MEDICAL CARE IF:   Your pain increases and is not controlled by medicines.   You have a fever or persistent symptoms for more than 2-3 days.   You have a fever and your symptoms suddenly get worse.   You have persistent nausea and vomiting.  MAKE SURE YOU:   Understand these instructions.  Will watch your condition.  Will get help right away if you are not doing well or get worse.   This information is not intended to replace advice given to you by your health care provider. Make sure you discuss any  questions you have with your health care provider.   Document Released: 05/29/2005 Document Revised: 02/02/2013 Document Reviewed: 11/24/2012 Elsevier Interactive Patient Education 2016 [place] Diet for Pancreatitis or Gallbladder Conditions A low-fat diet can be helpful if you have pancreatitis or a gallbladder  condition. With these conditions, your pancreas and gallbladder have trouble digesting fats. A healthy eating plan with less fat will help rest your pancreas and gallbladder and reduce your symptoms. WHAT DO I NEED TO KNOW ABOUT THIS DIET?  Eat a low-fat diet.  Reduce your fat intake to less than 20-30% of your total daily calories. This is less than 50-60 g of fat per day.  Remember that you need some fat in your diet. Ask your dietician what your daily goal should be.  Choose nonfat and low-fat healthy foods. Look for the words "nonfat," "low fat," or "fat free."  As a guide, look on the label and choose foods with less than 3 g of fat per serving. Eat only one serving.  Avoid alcohol.  Do not smoke. If you need help quitting, talk with your health care provider.  Eat small frequent meals instead of three large heavy meals. WHAT FOODS CAN I EAT? Grains Include healthy grains and starches such as potatoes, wheat bread, fiber-rich cereal, and brown rice. Choose whole grain options whenever possible. In adults, whole grains should account for 45-65% of your daily calories.  Fruits and Vegetables Eat plenty of fruits and vegetables. Fresh fruits and vegetables add fiber to your diet. Meats and Other Protein Sources Eat lean meat such as chicken and pork. Trim any fat off of meat before cooking it. Eggs, fish, and beans are other sources of protein. In adults, these foods should account for 10-35% of your daily calories. Dairy Choose low-fat milk and dairy options. Dairy includes fat and protein, as well as calcium.  Fats and Oils Limit high-fat foods such as fried foods, sweets, baked goods, sugary drinks.  Other Creamy sauces and condiments, such as mayonnaise, can add extra fat. Think about whether or not you need to use them, or use smaller amounts or low fat options. WHAT FOODS ARE NOT RECOMMENDED?  High fat foods, such as:  [employer].  Ice cream.  [place] toast.  Sweet  rolls.  Pizza.  Cheese bread.  Foods covered with batter, butter, creamy sauces, or cheese.  Fried foods.  Sugary drinks and desserts.  Foods that cause gas or bloating   This information is not intended to replace advice given to you by your health care provider. Make sure you discuss any questions you have with your health care provider.   Document Released: 06/07/2013 Document Reviewed: 06/07/2013 Elsevier Interactive Patient Education [employer].

## 2015-09-08 LAB — COMPREHENSIVE METABOLIC PANEL
ALK PHOS: 54 U/L (ref 40–115)
ALT: 35 U/L (ref 9–46)
AST: 29 U/L (ref 10–40)
Albumin: 4.3 g/dL (ref 3.6–5.1)
BILIRUBIN TOTAL: 0.5 mg/dL (ref 0.2–1.2)
BUN: 10 mg/dL (ref 7–25)
CALCIUM: 9 mg/dL (ref 8.6–10.3)
CO2: 23 mmol/L (ref 20–31)
Chloride: 104 mmol/L (ref 98–110)
Creat: 1.19 mg/dL (ref 0.60–1.35)
GLUCOSE: 91 mg/dL (ref 65–99)
POTASSIUM: 4.3 mmol/L (ref 3.5–5.3)
Sodium: 141 mmol/L (ref 135–146)
TOTAL PROTEIN: 7.6 g/dL (ref 6.1–8.1)

## 2015-09-08 LAB — URINE CULTURE
COLONY COUNT: NO GROWTH
Organism ID, Bacteria: NO GROWTH

## 2015-09-10 ENCOUNTER — Encounter: Payer: Self-pay | Admitting: Family Medicine

## 2015-09-11 NOTE — Telephone Encounter (Signed)
Paperwork completed scanned and faxed to company on 09/11/15

## 2015-09-15 ENCOUNTER — Ambulatory Visit (INDEPENDENT_AMBULATORY_CARE_PROVIDER_SITE_OTHER): Payer: Managed Care, Other (non HMO) | Admitting: Family Medicine

## 2015-09-15 VITALS — BP 120/83 | HR 87 | Temp 98.3°F | Resp 20 | Ht 72.0 in | Wt 316.0 lb

## 2015-09-15 DIAGNOSIS — R1032 Left lower quadrant pain: Secondary | ICD-10-CM

## 2015-09-15 DIAGNOSIS — R700 Elevated erythrocyte sedimentation rate: Secondary | ICD-10-CM

## 2015-09-15 DIAGNOSIS — R5383 Other fatigue: Secondary | ICD-10-CM | POA: Diagnosis not present

## 2015-09-15 LAB — C-REACTIVE PROTEIN: CRP: 0.7 mg/dL — ABNORMAL HIGH (ref <0.60)

## 2015-09-15 LAB — SEDIMENTATION RATE: Sed Rate: 11 mm/hr (ref 0–15)

## 2015-09-15 MED ORDER — DICYCLOMINE HCL 10 MG PO CAPS: 10.0000 mg | ORAL_CAPSULE | Freq: Three times a day (TID) | ORAL | Status: DC

## 2015-09-15 MED ORDER — HYDROCODONE-ACETAMINOPHEN 5-325 MG PO TABS: 1.0000 | ORAL_TABLET | Freq: Four times a day (QID) | ORAL | Status: DC | PRN

## 2015-09-15 NOTE — Progress Notes (Signed)
Subjective:  By signing my name below, I, Stann Ore, attest that this documentation has been prepared under the direction and in the presence of Norberto Sorenson, MD. Electronically Signed: Stann Ore, Scribe. 09/15/2015 , 2:44 PM .  Patient was seen in Room 12 .   Patient ID: Marc Long, male    DOB: 1975-07-14, 40 y.o.   MRN: [id number] Chief Complaint  Patient presents with  . Follow-up    Stomach Issues   HPI Marc Long is a 40 y.o. male who presents to [facility] for follow up for intermittent, sharp LLQ abdominal pain that started almost a month ago (08/21/15). 4th visit in past 3 weeks for his LLQ abdominal pain. It began 3/7 and was in clinic after discomfort for 4 days. Associated with darker stools and left flank pain exacerbated by deep breathing, CMP CBC was normal, urine was consistent with dehydration, hemoccult was negative, KUB was was unremarkable except large stool burden, suspected symptoms due to constipation and dehydration; however after starting miralax and pushing fluids, his symptoms were unchanged. His stool had returned to normal but he was complaining of abdominal distention and early satiety, increased belching and regurgitation of bile. Urine was normal, repeat KUB was still normal. Did not have any improvement with GI cocktail, so advised liquid only diet and sent for CT abdomen. Also started on prilosec. CT scan 1 week ago showed a large 3.7cm gall stone with diffused hepatic steatosis; also noted to have colon diverticulosis though no diverticulitis; CBC and CMP still remained normal. UA consistent with dehydration and mildly sed rate at 36. Patient was referred to general surgery for evaluation of the large gall stone. And put on a course of flagyl and cipro to see if any colon infection could be contributing.   Patient returns today stating the intermittent, sharp LLQ pain has not improved. He still feels an intermittent sharp pain daily, sometimes multiple episodes with  a "pulsing" sensation. He informs flagyl has made his stomach ache more. His stools have been dark-green to regular brown. He's also been feeling nauseated with decreased appetite. He denies any black tarry stools, blood in stools, or vomiting. He has surgery planned on 09/19/15.   He's also been complaining of fatigue and diarrhea.  Here w/ his mother again today.  Past Medical History  Diagnosis Date  . Depression    Prior to Admission medications   Medication Sig Start Date End Date Taking? Authorizing Provider  ciprofloxacin (CIPRO) 750 MG tablet Take 1 tablet (750 mg total) by mouth 2 (two) times daily. 09/07/15   Sherren Mocha, MD  meloxicam (MOBIC) 7.5 MG tablet Take 1 tablet (7.5 mg total) by mouth 2 (two) times daily as needed for pain. 08/28/15   Sherren Mocha, MD  metroNIDAZOLE (FLAGYL) 500 MG tablet Take 1 tablet (500 mg total) by mouth 4 (four) times daily. 09/07/15   Sherren Mocha, MD  omeprazole (PRILOSEC) 40 MG capsule Take 1 capsule (40 mg total) by mouth daily. 08/28/15   Sherren Mocha, MD  polyethylene glycol powder (GLYCOLAX/MIRALAX) powder Take 17 g by mouth 2 (two) times daily. 08/25/15   Sherren Mocha, MD  Probiotic Product (PROBIOTIC PO) Take by mouth.    Historical Provider, MD  traMADol (ULTRAM) 50 MG tablet Take 1 tablet (50 mg total) by mouth every 6 (six) hours as needed. 09/07/15   Sherren Mocha, MD   Allergies  Allergen Reactions  . Keflex [Cephalexin]   . Relafen [  Nabumetone]     Blood in stool    Review of Systems  Constitutional: Positive for activity change, appetite change and fatigue. Negative for fever, chills and unexpected weight change.  Respiratory: Negative for cough, shortness of breath and wheezing.   Gastrointestinal: Positive for nausea, abdominal pain and diarrhea. Negative for vomiting, constipation, blood in stool, abdominal distention and anal bleeding.  Genitourinary: Negative for dysuria, urgency, frequency and hematuria.  Skin: Negative for color change,  pallor and rash.  Hematological: Negative for adenopathy.  Psychiatric/Behavioral: Negative for sleep disturbance.       Objective:   Physical Exam  Constitutional: He is oriented to person, place, and time. He appears well-developed and well-nourished. No distress.  HENT:  Head: Normocephalic and atraumatic.  Eyes: EOM are normal. Pupils are equal, round, and reactive to light.  Neck: Neck supple.  Cardiovascular: Normal rate, regular rhythm, S1 normal, S2 normal and normal heart sounds.   No murmur heard. Pulmonary/Chest: Effort normal and breath sounds normal. No respiratory distress.  Abdominal: Soft. Bowel sounds are normal. He exhibits no distension. There is generalized tenderness. There is no rebound, no guarding and no CVA tenderness.  generalized tenderness to palpation mostly in LLQ, some in RUQ  Musculoskeletal: Normal range of motion.  Neurological: He is alert and oriented to person, place, and time.  Skin: Skin is warm and dry.  Psychiatric: He has a normal mood and affect. His behavior is normal.  Nursing note and vitals reviewed.   BP 120/83 mmHg  Pulse 87  Temp(Src) 98.3 F (36.8 C) (Oral)  Resp 20  Ht 6' (1.829 m)  Wt 316 lb (143.337 kg)  BMI 42.85 kg/m2  SpO2 97%    Assessment & Plan:   1. Abdominal pain, left lower quadrant   2. Elevated sed rate   3. Other fatigue   Has surgery eval in 4d to see if he is a candidate for acholecystectomy due to 3.7 cm gallstone with int RUQ pain - no colesystitis.  Large gallstone was found incidentally on CT which was obtained to eval pt's LLQ pain -CT was otherwise normal w/o any findings to explain his intermittent cramping LLQ pain.  Suspect cholelithiasis is likely unrelated to persistent LLQ pain.  LLQ pain is not sig changed after empiric course of flagyl and cipro last wk so refer to GI for further eval - may need colonscopy.  No sig lab abnormalities - ( has had sev normal UAs, cbc, cmp, neg HOC) other than  mildly elevated esr last wk so recheck inflammatory labs. Start therapeutic trial of bentyl.  Pt stating tramadol not adequately reducing pain and requests more potent pain med.  Orders Placed This Encounter  Procedures  . Sedimentation Rate  . C-reactive protein  . Ambulatory referral to Gastroenterology    Referral Priority:  Urgent    Referral Type:  Consultation    Referral Reason:  Specialty Services Required    Number of Visits Requested:  1  . POC Hemoccult Bld/Stl (3-Cd Home Screen)    Standing Status: Future     Number of Occurrences:      Standing Expiration Date: 09/14/2016    Meds ordered this encounter  Medications  . HYDROcodone-acetaminophen (NORCO/VICODIN) 5-325 MG tablet    Sig: Take 1 tablet by mouth every 6 (six) hours as needed for moderate pain.    Dispense:  20 tablet    Refill:  0  . dicyclomine (BENTYL) 10 MG capsule    Sig: Take  1 capsule (10 mg total) by mouth 4 (four) times daily -  before meals and at bedtime. As needed for bowel cramping    Dispense:  40 capsule    Refill:  0    I personally performed the services described in this documentation, which was scribed in my presence. The recorded information has been reviewed and considered, and addended by me as needed.  [name], MD MPH

## 2015-09-15 NOTE — Patient Instructions (Signed)

## 2015-09-19 ENCOUNTER — Encounter: Payer: Self-pay | Admitting: Family Medicine

## 2015-10-03 ENCOUNTER — Ambulatory Visit (INDEPENDENT_AMBULATORY_CARE_PROVIDER_SITE_OTHER): Payer: Managed Care, Other (non HMO) | Admitting: Family Medicine

## 2015-10-03 ENCOUNTER — Ambulatory Visit (INDEPENDENT_AMBULATORY_CARE_PROVIDER_SITE_OTHER): Payer: Managed Care, Other (non HMO) | Admitting: Gastroenterology

## 2015-10-03 ENCOUNTER — Encounter: Payer: Self-pay | Admitting: Gastroenterology

## 2015-10-03 VITALS — BP 136/90 | HR 69 | Temp 97.3°F | Resp 16 | Ht 72.0 in | Wt 314.0 lb

## 2015-10-03 VITALS — BP 122/88 | HR 72 | Ht 72.5 in | Wt 314.5 lb

## 2015-10-03 DIAGNOSIS — R1032 Left lower quadrant pain: Secondary | ICD-10-CM

## 2015-10-03 MED ORDER — NA SULFATE-K SULFATE-MG SULF 17.5-3.13-1.6 GM/177ML PO SOLN: 1.0000 | Freq: Once | ORAL | Status: DC

## 2015-10-03 MED ORDER — HYDROCODONE-ACETAMINOPHEN 5-325 MG PO TABS: 1.0000 | ORAL_TABLET | Freq: Four times a day (QID) | ORAL | Status: DC | PRN

## 2015-10-03 NOTE — Patient Instructions (Signed)
You will be set up for a colonoscopy for LLQ pains ([place]).

## 2015-10-03 NOTE — Progress Notes (Signed)
Subjective:  By signing my name below, I, Marc Long, attest that this documentation has been prepared under the direction and in the presence of Marc Cheadle, MD.  Electronically Signed: Thea Long, ED Scribe. 10/03/2015. 12:37 PM.   Patient ID: Marc Long, male    DOB: [birth year], 40 y.o.   MRN: [id number]  HPI   Chief Complaint  Patient presents with  . Follow-up    Blood pressure/ abdominal pain  . Diarrhea   HPI Comments: Marc Long is a 40 y.o. male who presents to the [facility] complaining of diarrhea. He developed acute onset of LLQ pain 5 weeks ago. He had stool changes initially seem to be due to constipation which was treated with miralax, however symptoms continued to worsen despite successful treatment of constipation as well as decreased appetite, abdominal distention, belching and regurgitation. He was sent for an abdominal CT which showed a large 3.7cm gallstone and hepatic steatosis as well as colon diverticulosis. Pain was persisting treated empirically for diverticulitis with cipro and flagyl. Once again there was no improvement with pain and continued having nausea and abdominal pain. He saw general surgery, pt was symptomatic to gallstone. He saw GI, Marc Long today. He has been out of work for over 5 weeks due to pain and over the past 2 weeks required narcotic treatment. He was advised to start low fat diet and lost 9 lbs in the past several weeks. Recommended colonoscopy for possible colitis vs IBS. He request narcotics from GI doctor but  was unwilling to prescribed this medication.   Pt reports new sporadic, watery loose that began a couple days ago.  He has 1-2 episodes of loose stools a day. He denies relief with bentyl and has not taken miralax. He still has intermittent dull abdominal pain but sharp pains with changing position. Pain tends to last 20 minutes.  He reports having 1-2 good days with very minimum pain. Pt has been taking tramadol but  will take Vicodin as needed when having severe abdominal pain. He has 1 pill of Vicodin left. He reports being consistent with low fat diet consisting of low fat waffle with honey and [place] sausage or an instant breakfast and  [place], bacon and egg sandwich on wheat toast. He eats 2-3 times a day. He reports hx of depression and anxiety, diagnosed about 10 years ago. It has been several years since he's met with a counselor. He had been on xanax, lithium and 2 other medications he is unable to recall but stopped medication on his own.  He denies nausea and emesis.  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  Colonoscopy scheduled for Monday, 5 days from today.  Pt is still taking Prilosec and has 1 pill left.   There are no active problems to display for this patient.  Past Medical History  Diagnosis Date  . Depression   . Gallstones   . Obesity, Class III, BMI 40-49.9 (morbid obesity) ([place])    History reviewed. No pertinent past surgical history. Allergies  Allergen Reactions  . Keflex [Cephalexin]   . Relafen [Nabumetone]     Blood in stool   Prior to Admission medications   Medication Sig Start Date End Date Taking? Authorizing Provider  dicyclomine (BENTYL) 10 MG capsule Take 1 capsule (10 mg total) by mouth 4 (four) times daily -  before meals and at bedtime. As needed for bowel cramping 09/15/15  Yes Harmon Pier  Ethlyn Daniels, MD  HYDROcodone-acetaminophen (NORCO/VICODIN) 5-325 MG tablet Take 1 tablet by mouth every 6 (six) hours as needed for moderate pain. 09/15/15  Yes [name], MD  polyethylene glycol powder (GLYCOLAX/MIRALAX) powder Take 17 g by mouth 2 (two) times daily. 08/25/15  Yes [name], MD  Probiotic Product (PROBIOTIC PO) Take by mouth.   Yes Historical Provider, MD  Na Sulfate-K Sulfate-Mg Sulf SOLN Take 1 kit by mouth once. Patient not taking: Reported on 10/03/2015 10/03/15   [name], MD  omeprazole (PRILOSEC) 40 MG capsule Take 1 capsule (40 mg total)  by mouth daily. Patient not taking: Reported on 10/03/2015 08/28/15   [name], MD   Family History  Problem Relation Age of Onset  . Heart attack Maternal Grandfather   . Lymphoma Maternal Grandmother   . Diabetes Maternal Aunt    Social History   Social History  . Marital Status: Single    Spouse Name: N/A  . Number of Children: 0  . Years of Education: N/A   Occupational History  . call center    Social History Main Topics  . Smoking status: Never Smoker   . Smokeless tobacco: Never Used  . Alcohol Use: 0.0 oz/week    0 Standard drinks or equivalent per week     Comment: once every 2 weeks. Wine coolers  . Drug Use: No  . Sexual Activity: Not Asked   Other Topics Concern  . None   Social History Narrative   Depression screen [facility] 2/9 10/12/2015 10/03/2015 08/25/2015  Decreased Interest 2 0 2  Down, Depressed, Hopeless 2 0 2  PHQ - 2 Score 4 0 4  Altered sleeping 2 - 0  Tired, decreased energy 2 - 2  Change in appetite 2 - 2  Feeling bad or failure about yourself  2 - 3  Trouble concentrating 0 - 0  Moving slowly or fidgety/restless 0 - 1  Suicidal thoughts 2 - 3  PHQ-9 Score 14 - 15  Difficult doing work/chores Very difficult - Not difficult at all     Review of Systems  Constitutional: Positive for diaphoresis, activity change, appetite change, fatigue and unexpected weight change. Negative for fever and chills.  Gastrointestinal: Positive for abdominal pain and diarrhea. Negative for nausea, vomiting, constipation, blood in stool, abdominal distention, anal bleeding and rectal pain.  Genitourinary: Negative for dysuria, frequency, hematuria, flank pain and difficulty urinating.  Psychiatric/Behavioral: Positive for suicidal ideas, sleep disturbance and dysphoric mood. Negative for self-injury and decreased concentration. The patient is not nervous/anxious.     Objective:   Physical Exam  Constitutional: He is oriented to person, place, and time. He appears  well-developed and well-nourished. No distress.  HENT:  Head: Normocephalic and atraumatic.  Eyes: Conjunctivae and EOM are normal.  Neck: Neck supple.  Cardiovascular: Normal rate.   Pulmonary/Chest: Effort normal.  Musculoskeletal: Normal range of motion.  Neurological: He is alert and oriented to person, place, and time.  Skin: Skin is warm and dry.  Psychiatric: He has a normal mood and affect. His behavior is normal.  Nursing note and vitals reviewed.   Filed Vitals:   10/03/15 1139  BP: 136/90  Pulse: 69  Temp: 97.3 F (36.3 C)  TempSrc: Oral  Resp: 16  Height: 6' (1.829 m)  Weight: 314 lb (142.429 kg)  SpO2: 96%    Assessment & Plan:   1. Abdominal pain, left lower quadrant   Has colonoscopy in a few days -  all other extensive w/u and imaging neg/normal and no sig improvement with prior empiric antibiotic trx so etiology of pain unknown.  Encouraged pt to consider seeing psych again as maybe would get improvement in IBS type sxs if mood is treated. Warned pt of dangers of chronic opiate use inc making his sx worse with constipation. Pt only using 1 hydrocodone/d and 1 tramadol qd and understands that he will need to stop using opiates for pain control if this turns out to be a chronic condition.   Meds ordered this encounter  Medications  . HYDROcodone-acetaminophen (NORCO/VICODIN) 5-325 MG tablet    Sig: Take 1 tablet by mouth every 6 (six) hours as needed for moderate pain.    Dispense:  20 tablet    Refill:  0    I personally performed the services described in this documentation, which was scribed in my presence. The recorded information has been reviewed and considered, and addended by me as needed.  Marc Cheadle, MD MPH

## 2015-10-03 NOTE — Progress Notes (Signed)
HPI: This is a  very pleasant 40 year old man    who was referred to me by [name], MD  to evaluate  left lower quadrant abdominal pains .    Chief complaint is left lower quadrant abdominal pains, recent loose stools  When standing up he has pain in left l quadrant, left groin.  The pain is a sharp then changes to dull.  Does not radiate.  The pains have been going on for past 6 weeks or so.  No preceding injury.    Eating does not effect it.  Sometimes having a BM will help.  Recently in past 2 days, very loose stools.  He's been taking vicodin for the pain for the past 2 weeks.  Prior to these changes, he had BM normally once daily without much issue.  No blood in his stools.  Nausea once recently, no vomiting.  Recently on low fat diet.  Weight in past year is up about 30 pounds, but lost 9 pounds in past few weeks.    CT scan 08/2015: Cholelithiasis, Colon diverticulosis without acute diverticulitis.  She was put on cipro/flagyl after the CT scan. Labs 2017: cbc. cmet were normal.  ESR normal, CRP was very slightly elevated.  He hurt his knee slightly before the abd pains.  Doesn't sound like abd injury at that time.  Review of systems: Pertinent positive and negative review of systems were noted in the above HPI section. Complete review of systems was performed and was otherwise normal.   Past Medical History  Diagnosis Date  . Depression   . Gallstones   . Obesity, Class III, BMI 40-49.9 (morbid obesity) ([place])     History reviewed. No pertinent past surgical history.  Current Outpatient Prescriptions  Medication Sig Dispense Refill  . dicyclomine (BENTYL) 10 MG capsule Take 1 capsule (10 mg total) by mouth 4 (four) times daily -  before meals and at bedtime. As needed for bowel cramping 40 capsule 0  . HYDROcodone-acetaminophen (NORCO/VICODIN) 5-325 MG tablet Take 1 tablet by mouth every 6 (six) hours as needed for moderate pain. 20 tablet 0  . Probiotic  Product (PROBIOTIC PO) Take by mouth.    Marland Kitchen omeprazole (PRILOSEC) 40 MG capsule Take 1 capsule (40 mg total) by mouth daily. (Patient not taking: Reported on 10/03/2015) 30 capsule 1  . polyethylene glycol powder (GLYCOLAX/MIRALAX) powder Take 17 g by mouth 2 (two) times daily. (Patient not taking: Reported on 10/03/2015) 225 g 1   No current facility-administered medications for this visit.    Allergies as of 10/03/2015 - Review Complete 10/03/2015  Allergen Reaction Noted  . Keflex [cephalexin]  09/20/2012  . Relafen [nabumetone]  08/25/2015    Family History  Problem Relation Age of Onset  . Heart attack Maternal Grandfather   . Lymphoma Maternal Grandmother   . Diabetes Maternal Aunt     Social History   Social History  . Marital Status: Single    Spouse Name: N/A  . Number of Children: 0  . Years of Education: N/A   Occupational History  . call center    Social History Main Topics  . Smoking status: Never Smoker   . Smokeless tobacco: Never Used  . Alcohol Use: 0.0 oz/week    0 Standard drinks or equivalent per week     Comment: once every 2 weeks. Wine coolers  . Drug Use: No  . Sexual Activity: Not on file   Other Topics Concern  . Not  on file   Social History Narrative     Physical Exam: BP 122/88 mmHg  Pulse 72  Ht 6' 0.5" (1.842 m)  Wt 314 lb 8 oz (142.656 kg)  BMI 42.04 kg/m2 Constitutional: Orally obese otherwise well-appearing Psychiatric: alert and oriented x3 Eyes: extraocular movements intact Mouth: oral pharynx moist, no lesions Neck: supple no lymphadenopathy Cardiovascular: heart regular rate and rhythm Lungs: clear to auscultation bilaterally Abdomen: soft, mild left lower quadrant tenderness, nondistended, no obvious ascites, no peritoneal signs, normal bowel sounds Extremities: no lower extremity edema bilaterally Skin: no lesions on visible extremities   Assessment and plan: 40 y.o. male with  left lower quadrant pains  Labs and  imaging studies have failed to show clear diagnosis. I recommended we proceed with colonoscopy since CT scan does not get a great evaluation of the colon lumen. He is also had some loose stools recently. Perhaps he has underlying mild colitis. Seems that this may be mild IBS as well. He asked for a note to excuse him from work until colonoscopy can be done, he also asked for more narcotic pain medicines. I am not willing to give either of those in this circumstance.   Owens Loffler, MD [facility] 10/03/2015, 9:16 AM  Cc: [name], MD

## 2015-10-03 NOTE — Patient Instructions (Signed)
     IF you received an x-ray today, you will receive an invoice from [facility]. Please contact [facility] at [phone] with questions or concerns regarding your invoice.   IF you received labwork today, you will receive an invoice from [facility]. Please contact [facility] at [phone] with questions or concerns regarding your invoice.   Our billing staff will not be able to assist you with questions regarding bills from these companies.  You will be contacted with the lab results as soon as they are available. The fastest way to get your results is to activate your My Chart account. Instructions are located on the last page of this paperwork. If you have not heard from us regarding the results in 2 weeks, please contact this office.      

## 2015-10-08 ENCOUNTER — Ambulatory Visit (AMBULATORY_SURGERY_CENTER): Payer: Managed Care, Other (non HMO) | Admitting: Gastroenterology

## 2015-10-08 ENCOUNTER — Encounter: Payer: Self-pay | Admitting: Gastroenterology

## 2015-10-08 VITALS — BP 116/76 | HR 81 | Temp 97.5°F | Resp 10 | Ht 72.0 in | Wt 314.0 lb

## 2015-10-08 DIAGNOSIS — R1032 Left lower quadrant pain: Secondary | ICD-10-CM

## 2015-10-08 DIAGNOSIS — K5730 Diverticulosis of large intestine without perforation or abscess without bleeding: Secondary | ICD-10-CM

## 2015-10-08 MED ORDER — SODIUM CHLORIDE 0.9 % IV SOLN: 500.0000 mL | INTRAVENOUS | Status: DC

## 2015-10-08 MED ORDER — HYOSCYAMINE SULFATE ER 0.375 MG PO TB12: 0.3750 mg | ORAL_TABLET | Freq: Two times a day (BID) | ORAL | Status: DC

## 2015-10-08 NOTE — Progress Notes (Signed)
Called to room to assist during endoscopic procedure.  Patient ID and intended procedure confirmed with present staff. Received instructions for my participation in the procedure from the performing physician.  

## 2015-10-08 NOTE — Op Note (Signed)
[place] Patient Name: Marc Long Procedure Date: 10/08/2015 2:14 PM MRN: [id number] Endoscopist: [name] , MD Age: 40 Date of Birth: 26-Feb-1976 Gender: Male Procedure:                Colonoscopy Indications:              Abdominal pain in the left lower quadrant, Diarrhea Medicines:                Monitored Anesthesia Care Procedure:                Pre-Anesthesia Assessment:                           - Prior to the procedure, a History and Physical                            was performed, and patient medications and                            allergies were reviewed. The patient's tolerance of                            previous anesthesia was also reviewed. The risks                            and benefits of the procedure and the sedation                            options and risks were discussed with the patient.                            All questions were answered, and informed consent                            was obtained. Prior Anticoagulants: The patient has                            taken no previous anticoagulant or antiplatelet                            agents. ASA Grade Assessment: II - A patient with                            mild systemic disease. After reviewing the risks                            and benefits, the patient was deemed in                            satisfactory condition to undergo the procedure.                           After obtaining informed consent, the colonoscope  was passed under direct vision. Throughout the                            procedure, the patient's blood pressure, pulse, and                            oxygen saturations were monitored continuously. The                            Model CF-HQ190L [id number]) scope was introduced                            through the anus and advanced to the the terminal                            ileum. The colonoscopy was performed without                           difficulty. The patient tolerated the procedure                            well. The quality of the bowel preparation was                            good. The terminal ileum, ileocecal valve,                            appendiceal orifice, and rectum were photographed. Scope In: 2:25:31 PM Scope Out: 2:34:13 PM Scope Withdrawal Time: 0 hours 6 minutes 57 seconds  Total Procedure Duration: 0 hours 8 minutes 42 seconds  Findings:                 The terminal ileum appeared normal.                           Multiple small and large-mouthed diverticula were                            found in the entire colon.                           The entire examined colon appeared otherwise                            normal. Biopsies for histology were taken randomly                            from the colon with a cold forceps for evaluation                            of microscopic colitis. Complications:            No immediate complications. Estimated blood loss:  None. Estimated Blood Loss:     Estimated blood loss: none. Impression:               - The examined portion of the ileum was normal.                           - Diverticulosis in the entire examined colon.                           - The entire examined colon is otherwise normal.                            Biopsied. Recommendation:           - Patient has a contact number available for                            emergencies. The signs and symptoms of potential                            delayed complications were discussed with the                            patient. Return to normal activities tomorrow.                            Written discharge instructions were provided to the                            patient.                           - Resume previous diet.                           - Continue present medications.                           - Await pathology results.                            - Repeat colonoscopy at age 33 for screening                            purposes.                           - Please try twice daily antispasm medicine; this                            MAY help your LLQ if they are related to your                            colon. This was called into your pharmacy today. [name], MD 10/08/2015 2:39:30 PM This report has been signed electronically.

## 2015-10-08 NOTE — Patient Instructions (Signed)
Discharge instructions given. Biopsies taken. Handouts on diverticulosis and a high fiber diet. Resume previous medications. YOU HAD AN ENDOSCOPIC PROCEDURE TODAY AT THE [facility]:   Refer to the procedure report that was given to you for any specific questions about what was found during the examination.  If the procedure report does not answer your questions, please call your gastroenterologist to clarify.  If you requested that your care partner not be given the details of your procedure findings, then the procedure report has been included in a sealed envelope for you to review at your convenience later.  YOU SHOULD EXPECT: Some feelings of bloating in the abdomen. Passage of more gas than usual.  Walking can help get rid of the air that was put into your GI tract during the procedure and reduce the bloating. If you had a lower endoscopy (such as a colonoscopy or flexible sigmoidoscopy) you may notice spotting of blood in your stool or on the toilet paper. If you underwent a bowel prep for your procedure, you may not have a normal bowel movement for a few days.  Please Note:  You might notice some irritation and congestion in your nose or some drainage.  This is from the oxygen used during your procedure.  There is no need for concern and it should clear up in a day or so.  SYMPTOMS TO REPORT IMMEDIATELY:   Following lower endoscopy (colonoscopy or flexible sigmoidoscopy):  Excessive amounts of blood in the stool  Significant tenderness or worsening of abdominal pains  Swelling of the abdomen that is new, acute  Fever of 100F or higher   For urgent or emergent issues, a gastroenterologist can be reached at any hour by calling [phone].   DIET: Your first meal following the procedure should be a small meal and then it is ok to progress to your normal diet. Heavy or fried foods are harder to digest and may make you feel nauseous or bloated.  Likewise, meals heavy in  dairy and vegetables can increase bloating.  Drink plenty of fluids but you should avoid alcoholic beverages for 24 hours.  ACTIVITY:  You should plan to take it easy for the rest of today and you should NOT DRIVE or use heavy machinery until tomorrow (because of the sedation medicines used during the test).    FOLLOW UP: Our staff will call the number listed on your records the next business day following your procedure to check on you and address any questions or concerns that you may have regarding the information given to you following your procedure. If we do not reach you, we will leave a message.  However, if you are feeling well and you are not experiencing any problems, there is no need to return our call.  We will assume that you have returned to your regular daily activities without incident.  If any biopsies were taken you will be contacted by phone or by letter within the next 1-3 weeks.  Please call us at [phone] if you have not heard about the biopsies in 3 weeks.    SIGNATURES/CONFIDENTIALITY: You and/or your care partner have signed paperwork which will be entered into your electronic medical record.  These signatures attest to the fact that that the information above on your After Visit Summary has been reviewed and is understood.  Full responsibility of the confidentiality of this discharge information lies with you and/or your care-partner.  

## 2015-10-08 NOTE — Progress Notes (Signed)
Report to PACU, RN, vss, BBS= Clear.  

## 2015-10-09 ENCOUNTER — Telehealth: Payer: Self-pay | Admitting: *Deleted

## 2015-10-09 NOTE — Telephone Encounter (Signed)
No answer. Number identifier. Message left to call if questions or concerns. 

## 2015-10-12 ENCOUNTER — Ambulatory Visit (INDEPENDENT_AMBULATORY_CARE_PROVIDER_SITE_OTHER): Payer: Managed Care, Other (non HMO) | Admitting: Family Medicine

## 2015-10-12 VITALS — BP 132/88 | HR 89 | Temp 98.0°F | Resp 20 | Ht 72.0 in | Wt 311.0 lb

## 2015-10-12 DIAGNOSIS — R1031 Right lower quadrant pain: Secondary | ICD-10-CM

## 2015-10-12 DIAGNOSIS — R197 Diarrhea, unspecified: Secondary | ICD-10-CM | POA: Diagnosis not present

## 2015-10-12 DIAGNOSIS — R1032 Left lower quadrant pain: Secondary | ICD-10-CM

## 2015-10-12 NOTE — Progress Notes (Signed)
   Subjective:    Patient ID: Marc Long, male    DOB: 1975/07/13, 40 y.o.   MRN: [id number]  HPI  Pt had colonoscopy early this week.  The cleanout was unremarkable and successful - initially did spike his pain.  Colonoscopy went well and pathology came back today showing it was normal.  He is asking for more detailed information for his disability insurance on why I kept him out of work from 4/21 to  on keeping him out of work 4/30.  He did have a colonoscopy on 4/24.  GI started him on hyoscyamine - had tried bentyl prior which also helped - pt states that this combined with the hydrocodone has helped control his symptoms to a tolerable point.  He is now  Having pain in the RLQ rather than the LLQ. He has restarted having some diarrhea that is occ - not everyday but still more freq than baseline.  He doesn't deswcribe pain as cramping.  Pain does not seem to vary based upon bowel movements. Poor appetite  - it is 5 pm and he has not eaten yet today.   For sometime now he has been thinking about seeing a psychiatrist. Has not been onmeds for about 13-15 years - was on risperidal, lithium, xanx, and something else from about 40 yo to 40 yo.  He doesn't remember anyitng in particular being helpful  Review of Systems     Objective:   Physical Exam        Assessment & Plan:

## 2015-10-12 NOTE — Patient Instructions (Addendum)
Try to wean yourself off of the levsin/bentyl as you are able and off the tramadol/hydrocodone as you are able - since we don't know what is going on, noticing your pain - its time course, severity, exacerbating and relieving factors in a diary along with your diet will be helpful.  Consider trying Altoids - sometimes mint (maybe spearamint is even better?) can help soothe IBS - mint tea can also help.  Consider trying the IB Guard supplement.    IF you received an x-ray today, you will receive an invoice from [facility]. Please contact [facility] Rad[facility] at [phone] with questions or concerns regarding your invoice.   IF you received labwork today, you will receive an invoice from [employer]. Please contact [facility] at [phone] with questions or concerns regarding your invoice.   Our billing staff will not be able to assist you with questions regarding bills from these companies.  You will be contacted with the lab results as soon as they are available. The fastest way to get your results is to activate your My Chart account. Instructions are located on the last page of this paperwork. If you have not heard from [place] regarding the results in 2 weeks, please contact this office.     Diet for Irritable Bowel Syndrome When you have irritable bowel syndrome (IBS), the foods you eat and your eating habits are very important. IBS may cause various symptoms, such as abdominal pain, constipation, or diarrhea. Choosing the right foods can help ease discomfort caused by these symptoms. Work with your health care provider and dietitian to find the best eating plan to help control your symptoms. WHAT GENERAL GUIDELINES DO I NEED TO FOLLOW?  Keep a food diary. This will help you identify foods that cause symptoms. Write down:  What you eat and when.  What symptoms you have.  When symptoms occur in relation to your meals.  Avoid foods that cause symptoms.  Talk with your dietitian about other ways to get the same nutrients that are in these foods.  Eat more foods that contain fiber. Take a fiber supplement if directed by your dietitian.  Eat your meals slowly, in a relaxed setting.  Aim to eat 5-6 small meals per day. Do not skip meals.  Drink enough fluids to keep your urine clear or pale yellow.  Ask your health care provider if you should take an over-the-counter probiotic during flare-ups to help restore healthy gut bacteria.  If you have cramping or diarrhea, try making your meals low in fat and high in carbohydrates. Examples of carbohydrates are pasta, rice, whole grain breads and cereals, fruits, and vegetables.  If dairy products cause your symptoms to flare up, try eating less of them. You might be able to handle yogurt better than other dairy products because it contains bacteria that help with digestion. WHAT FOODS ARE NOT RECOMMENDED? The following are some foods and drinks that may worsen your symptoms:  Fatty foods, such as [place] fries.  Milk products, such as cheese or ice cream.  Chocolate.  Alcohol.  Products with caffeine, such as coffee.  Carbonated drinks, such as soda. The items listed above may not be a complete list of foods and beverages to avoid. Contact your dietitian for more information. WHAT FOODS ARE GOOD SOURCES OF FIBER? Your health care provider or dietitian may recommend that you eat more foods that contain fiber. Fiber can help reduce constipation and other IBS symptoms. Add foods with fiber to your  diet a little at a time so that your body can get used to them. Too much fiber at once might cause gas and swelling of your abdomen. The following are some foods that are good sources of fiber:  Apples.  Peaches.  Pears.  Berries.  Figs.  Broccoli (raw).  Cabbage.  Carrots.  Raw peas.  Kidney beans.  Lima beans.  Whole grain bread.  Whole grain cereal. FOR MORE INFORMATION   International Foundation for Functional Gastrointestinal Disorders: www.iffgd.[employer] of Diabetes and Digestive and Kidney Diseases: NetworkAffair.co.za.aspx   This information is not intended to replace advice given to you by your health care provider. Make sure you discuss any questions you have with your health care provider.   Document Released: 08/23/2003 Document Revised: 06/23/2014 Document Reviewed: 09/02/2013 Elsevier Interactive Patient Education 2016 [place] - here is the list that I give most people. They are all good but for you I would recommend the first and the last that are highlighted in bold in particular.  You can call the 24-hour [facility] at [phone] or [phone] for immediate assistance. Among several different types of services, they offer an Intensive oupatient program for mood disorders - which is a group type setting Monday-Friday 9-noon.  You can schedule an assessment by calling the above numbers during which the costs for the program and insurance benefits will be reviewed.    No psychological or psychiatric services take physician referrals - they always want the patient to call. Some excellent private psychiatrists for individual counseling are:  [place]  [address], [place], [place] [address]  Phone:(336) [place] [address], [place], [place] [address]  Phone:(336) Greenlee Catlett [place], [place] Phone: [place]  [address] Carolynne Edouard [place], [place] [address]  Phone:(336) [place], [place], [place] [address], [place], [place] [address] Phone: [phone]  You can always go to [facility] if suicidal or there are walk-in crisis  services available at: [facility] [address] [place], [place] [address] [phone]  Hour of operations: Monday-Friday, 8:30-5 p.m.  They take medicare/medicaid and the patient can contact [facility] referral department at [phone] or [email or web address] for more information.

## 2015-10-15 ENCOUNTER — Encounter: Payer: Self-pay | Admitting: Gastroenterology

## 2015-11-02 ENCOUNTER — Ambulatory Visit (INDEPENDENT_AMBULATORY_CARE_PROVIDER_SITE_OTHER): Payer: Managed Care, Other (non HMO) | Admitting: Family Medicine

## 2015-11-02 VITALS — BP 132/80 | HR 85 | Temp 98.1°F | Resp 16 | Ht 72.0 in | Wt 309.0 lb

## 2015-11-02 DIAGNOSIS — R1032 Left lower quadrant pain: Secondary | ICD-10-CM | POA: Diagnosis not present

## 2015-11-02 MED ORDER — TRAMADOL HCL 50 MG PO TABS: 50.0000 mg | ORAL_TABLET | Freq: Four times a day (QID) | ORAL | Status: DC | PRN

## 2015-11-02 MED ORDER — HYDROCODONE-ACETAMINOPHEN 5-325 MG PO TABS: 1.0000 | ORAL_TABLET | Freq: Four times a day (QID) | ORAL | Status: DC | PRN

## 2015-11-02 NOTE — Progress Notes (Signed)
Subjective:  This chart was scribed for Marc Cheadle MD,  by Marc Long, at [facility].  This patient was seen in room  8 and the patient's care was started at 12:21 PM.    Patient ID: Marc Long, male    DOB: 10/03/1975, 40 y.o.   MRN: [id number] Chief Complaint  Patient presents with  . Forms    pt. needs to talk about forms e-mailed to Dr. Brigitte Long     HPI  HPI Comments: Marc Long is a 40 y.o. male who presents to the [facility] regarding paperwork. Patients work would like clarification on what his restrictions are for his job accomodation.  Patient has been back to work and has been taking his bathroom breaks as needed. He has been taking 4- 30 minute breaks per shift and goes into a quiet room where he relaxes and meditates. His abdominal pain has been unchanged.  He is still eating 3 meals per day and has lost 2 pounds since his last visit. He is still straining at times during his periods of constipation.  He denies any difficulty sleeping and has increased pain where he first wakes up. There was one day where he had to miss work due to the pain and noticed that the moment he ate something, it went away. Patient is following a food map and went to a psychologist on Wednesday. He has an appointment for the digestive health center at [facility] and plans on attending. He denies any abnormal urinary symptoms, nausea/ vomiting.    Patient would like a refill on his Vicodin and Ultram. He has stretched out the time period of his Vicodin use. He is compliant with his Hyoscyamine.    Past Medical History  Diagnosis Date  . Depression   . Gallstones   . Obesity, Class III, BMI 40-49.9 (morbid obesity) ([place])    Current Outpatient Prescriptions on File Prior to Visit  Medication Sig Dispense Refill  . HYDROcodone-acetaminophen (NORCO/VICODIN) 5-325 MG tablet Take 1 tablet by mouth every 6 (six) hours as needed for moderate pain. 20 tablet 0  .  hyoscyamine (LEVBID) 0.375 MG 12 hr tablet Take 1 tablet (0.375 mg total) by mouth 2 (two) times daily. 60 tablet 6  . Multiple Vitamins-Minerals (MULTIVITAMIN WITH MINERALS) tablet Take 1 tablet by mouth daily.    . Probiotic Product (PROBIOTIC PO) Take by mouth.    . traMADol (ULTRAM) 50 MG tablet TK 1 T PO Q 6 H PRN  1  . UNABLE TO FIND Take 1,500 mg by mouth. Med Name: cinnamon     No current facility-administered medications on file prior to visit.    Allergies  Allergen Reactions  . Keflex [Cephalexin]   . Relafen [Nabumetone]     Blood in stool     Review of Systems  Constitutional: Negative for fever and chills.  Eyes: Negative for pain, redness and itching.  Gastrointestinal: Positive for abdominal pain. Negative for nausea and vomiting.  Genitourinary: Negative for dysuria.  Musculoskeletal: Negative for gait problem, neck pain and neck stiffness.  Neurological: Negative for seizures, syncope and speech difficulty.       Objective:   Physical Exam  Constitutional: He is oriented to person, place, and time. No distress.  HENT:  Head: Normocephalic and atraumatic.  Eyes: Pupils are equal, round, and reactive to light.  Cardiovascular: Normal rate, regular rhythm, S1 normal, S2 normal and normal heart sounds.  Exam reveals no  gallop and no friction rub.   No murmur heard. Pulmonary/Chest: Effort normal and breath sounds normal. No respiratory distress. He has no wheezes. He has no rales.  Abdominal: Soft. He exhibits no distension. There is tenderness. There is guarding. There is no rebound.  Hypoactive bowel sounds.  Tenderness to palpation in suprapubic and bilateral lower quadrant- worse in left lower quadrant.     Neurological: He is alert and oriented to person, place, and time.  Skin: Skin is warm and dry.  Psychiatric: He has a normal mood and affect. His behavior is normal.   Filed Vitals:   11/02/15 1123  BP: 132/80  Long: 85  Temp: 98.1 F (36.7 C)    TempSrc: Oral  Resp: 16  Height: 6' (1.829 m)  Weight: 309 lb (140.161 kg)  SpO2: 96%       Assessment & Plan:   1. Abdominal pain, left lower quadrant   Has appt w/ [facility] in 1 wk for second opinion as to possible etiology of sudden onset of LLQ abd pain which started 2 mos ago - has been unresponsive to treatment for diverticulitis, IBS-type sxs, and abd wall strain.  Was out of work >`1 mo for this- now back at work but here today to get letter clarifying job restrictions needed for exacerbations of pain several times/d.    Cont to use pain meds very sparingly - pt knows he cannot cont this chronically. Cont FODMAP diet with prn levsin and altoids.  Pt has established with psychologist.  See letter below.  It is my medical opinion that Marc Long may return to light duty immediately with the following restrictions: He may need up to 4 additional 15 minute washroom breaks per shfit. Pt should be able to perform job functions when taking the sedating medications. He should not need to leave work when taking this but he may need to lay down for up to 45 minutes for up to 4 time per shift. He should not need to use the medication more than one time per shift. I do expect the need for these restrictions to be temporary. I will provide a release note when he is able to return to work full duty. Hopefully this will be around August 2017.  Meds ordered this encounter  Medications  . HYDROcodone-acetaminophen (NORCO/VICODIN) 5-325 MG tablet    Sig: Take 1 tablet by mouth every 6 (six) hours as needed for moderate pain.    Dispense:  20 tablet    Refill:  0  . traMADol (ULTRAM) 50 MG tablet    Sig: Take 1 tablet (50 mg total) by mouth every 6 (six) hours as needed.    Dispense:  30 tablet    Refill:  1   Over 25 min spent in face-to-face evaluation of and consultation with patient and coordination of care.  Over 50% of this time was spent counseling this patient.  I  personally performed the services described in this documentation, which was scribed in my presence. The recorded information has been reviewed and considered, and addended by me as needed.  Marc Cheadle, MD MPH

## 2015-11-07 DIAGNOSIS — K8020 Calculus of gallbladder without cholecystitis without obstruction: Secondary | ICD-10-CM | POA: Insufficient documentation

## 2015-11-07 DIAGNOSIS — K5730 Diverticulosis of large intestine without perforation or abscess without bleeding: Secondary | ICD-10-CM | POA: Insufficient documentation

## 2016-02-16 ENCOUNTER — Ambulatory Visit (HOSPITAL_COMMUNITY)
Admission: EM | Admit: 2016-02-16 | Discharge: 2016-02-16 | Disposition: A | Discharge status: Home / Self Care | Payer: Managed Care, Other (non HMO) | Attending: Family Medicine | Admitting: Family Medicine

## 2016-02-16 ENCOUNTER — Encounter (HOSPITAL_COMMUNITY): Payer: Self-pay | Admitting: Emergency Medicine

## 2016-02-16 DIAGNOSIS — R1030 Lower abdominal pain, unspecified: Secondary | ICD-10-CM

## 2016-02-16 LAB — POCT URINALYSIS DIP (DEVICE)
Bilirubin Urine: NEGATIVE
GLUCOSE, UA: NEGATIVE mg/dL
Hgb urine dipstick: NEGATIVE
KETONES UR: 15 mg/dL — AB
LEUKOCYTES UA: NEGATIVE
Nitrite: NEGATIVE
Protein, ur: NEGATIVE mg/dL
SPECIFIC GRAVITY, URINE: 1.02 (ref 1.005–1.030)
Urobilinogen, UA: 0.2 mg/dL (ref 0.0–1.0)
pH: 6 (ref 5.0–8.0)

## 2016-02-16 NOTE — Discharge Instructions (Signed)
THERE ARE NO SIGNS OF OBSTRUCTION OR INFECTION IN YOUR URINE TODAY  IF YOUR ABDOMINAL PAIN BECOMES MORE IN THE LEFT LOWER QUAD, YOU MAY NEED TO BE SEEN IN THE ER FOR EVALUATION OF DIVERTICULITIS. WHICH MAY CALL FOR CT SCAN OF YOUR ABDOMEN.  SUGGEST TYLENOL FOR DISCOMFORT AND A LIGHT DIET.

## 2016-02-16 NOTE — ED Triage Notes (Signed)
The patient presented to the [facility] with a complaint of abdominal pain x 1 day that he described as sharp. The patient stated that he had a fever yesterday.

## 2016-02-16 NOTE — ED Provider Notes (Signed)
CSN: [id number]     Arrival date & time 02/16/16  1656 History   None    Chief Complaint  Patient presents with  . Abdominal Pain   (Consider location/radiation/quality/duration/timing/severity/associated sxs/prior Treatment) HPI 40 y/o male with 2 day hx of sharp abdo pain. Some nausea, no vomiting. No diarrhea. Hx of diverticulosis  Past Medical History:  Diagnosis Date  . Depression   . Gallstones   . Obesity, Class III, BMI 40-49.9 (morbid obesity) ([place])    History reviewed. No pertinent surgical history. Family History  Problem Relation Age of Onset  . Heart attack Maternal Grandfather   . Lymphoma Maternal Grandmother   . Diabetes Maternal Aunt    Social History  Substance Use Topics  . Smoking status: Never Smoker  . Smokeless tobacco: Never Used  . Alcohol use 0.0 oz/week     Comment: once every 2 weeks. Wine coolers    Review of Systems  Denies: HEADACHE, NAUSEA,  CHEST PAIN, CONGESTION, DYSURIA, SHORTNESS OF BREATH  Allergies  Keflex [cephalexin] and Relafen [nabumetone]  Home Medications   Prior to Admission medications   Medication Sig Start Date End Date Taking? Authorizing Provider  lamoTRIgine (LAMICTAL) 100 MG tablet Take 100 mg by mouth daily.   Yes Historical Provider, MD  HYDROcodone-acetaminophen (NORCO/VICODIN) 5-325 MG tablet Take 1 tablet by mouth every 6 (six) hours as needed for moderate pain. 11/02/15   [name], MD  hyoscyamine (LEVBID) 0.375 MG 12 hr tablet Take 1 tablet (0.375 mg total) by mouth 2 (two) times daily. 10/08/15   [name], MD  Multiple Vitamins-Minerals (MULTIVITAMIN WITH MINERALS) tablet Take 1 tablet by mouth daily.    Historical Provider, MD  Probiotic Product (PROBIOTIC PO) Take by mouth.    Historical Provider, MD  traMADol (ULTRAM) 50 MG tablet Take 1 tablet (50 mg total) by mouth every 6 (six) hours as needed. 11/02/15   [name], MD  UNABLE TO FIND Take 1,500 mg by mouth. Med Name: cinnamon    Historical  Provider, MD   Meds Ordered and Administered this Visit  Medications - No data to display  BP 116/81 (BP Location: Left Arm)   Pulse 69   Temp 98.5 F (36.9 C) (Oral)   Resp 16   SpO2 97%  No data found.   Physical Exam NURSES NOTES AND VITAL SIGNS REVIEWED. CONSTITUTIONAL: Well developed, well nourished, no acute distress HEENT: normocephalic, atraumatic EYES: Conjunctiva normal NECK:normal ROM, supple, no adenopathy PULMONARY:No respiratory distress, normal effort ABDOMINAL: Soft, ND, NT BS+, No CVAT MUSCULOSKELETAL: Normal ROM of all extremities,  SKIN: warm and dry without rash PSYCHIATRIC: Mood and affect, behavior are normal  Urgent Care Course   Clinical Course    Procedures (including critical care time)  Labs Review Labs Reviewed  POCT URINALYSIS DIP (DEVICE) - Abnormal; Notable for the following:       Result Value   Ketones, ur 15 (*)    All other components within normal limits   Discussed with patient prior to discharge Imaging Review No results found.   Visual Acuity Review  Right Eye Distance:   Left Eye Distance:   Bilateral Distance:    Right Eye Near:   Left Eye Near:    Bilateral Near:         MDM   1. Lower abdominal pain     Patient is reassured that there are no issues that require transfer to higher level of care at this time or  additional tests. Patient is advised to continue home symptomatic treatment. Patient is advised that if there are new or worsening symptoms to attend the emergency department, contact primary care provider, or return to UC. Instructions of care provided discharged home in stable condition.    THIS NOTE WAS GENERATED USING A VOICE RECOGNITION SOFTWARE PROGRAM. ALL REASONABLE EFFORTS  WERE MADE TO PROOFREAD THIS DOCUMENT FOR ACCURACY.  I have verbally reviewed the discharge instructions with the patient. A printed AVS was given to the patient.  All questions were answered prior to discharge.       Konrad Felix, PA 02/16/16 2104

## 2016-02-21 ENCOUNTER — Ambulatory Visit (INDEPENDENT_AMBULATORY_CARE_PROVIDER_SITE_OTHER): Payer: Managed Care, Other (non HMO) | Admitting: Physician Assistant

## 2016-02-21 VITALS — BP 120/82 | HR 86 | Temp 98.4°F | Resp 17 | Ht 72.0 in | Wt 301.0 lb

## 2016-02-21 DIAGNOSIS — R39198 Other difficulties with micturition: Secondary | ICD-10-CM | POA: Diagnosis not present

## 2016-02-21 DIAGNOSIS — S61206A Unspecified open wound of right little finger without damage to nail, initial encounter: Secondary | ICD-10-CM

## 2016-02-21 DIAGNOSIS — S61219A Laceration without foreign body of unspecified finger without damage to nail, initial encounter: Secondary | ICD-10-CM

## 2016-02-21 DIAGNOSIS — Z23 Encounter for immunization: Secondary | ICD-10-CM

## 2016-02-21 DIAGNOSIS — S61209A Unspecified open wound of unspecified finger without damage to nail, initial encounter: Secondary | ICD-10-CM

## 2016-02-21 LAB — POCT URINALYSIS DIP (MANUAL ENTRY)
Bilirubin, UA: NEGATIVE
Glucose, UA: NEGATIVE
Ketones, POC UA: NEGATIVE
NITRITE UA: NEGATIVE
PH UA: 8.5
Spec Grav, UA: 1.015
UROBILINOGEN UA: 0.2

## 2016-02-21 LAB — POC MICROSCOPIC URINALYSIS (UMFC): MUCUS RE: ABSENT

## 2016-02-21 LAB — PSA: PSA: 1.2 ng/mL (ref <=4.0)

## 2016-02-21 NOTE — Patient Instructions (Addendum)
   I will contact you with lab results.  In the meantime, I have made a referral for urology.  Thank you for letting me participate in your health and well being.   IF you received an x-ray today, you will receive an invoice from [facility] Radiology. Please contact [facility] Radiology at [phone] with questions or concerns regarding your invoice.   IF you received labwork today, you will receive an invoice from [employer]. Please contact [facility] at [phone] with questions or concerns regarding your invoice.   Our billing staff will not be able to assist you with questions regarding bills from these companies.  You will be contacted with the lab results as soon as they are available. The fastest way to get your results is to activate your My Chart account. Instructions are located on the last page of this paperwork. If you have not heard from [place] regarding the results in 2 weeks, please contact this office.

## 2016-02-21 NOTE — Progress Notes (Signed)
Marc Long  MRN: [id number] DOB: [birth year]  Subjective:  Marc Long is a 40 y.o. male seen in office today for a chief complaint of difficulty urinating. Pt states that he become more aware of this problem one week ago when he had a stomach bug because his stomach was hurting so when he was having to push hard to urinate it was uncomfortable. But after thinking about it, he realized that he has been having to push harder to urinate for the past year.  Notes that he also has two streams when he pees and often has dribbling. Has associated urinary frequency, urinary urgency, and suprapubic pain. Denies hematuria, flank pain, burning with urination, testicular pain/swelling, penile discharge, and rectal pressure/pain. Pt is not currently sexually active. Last sexual intercourse was 11/2015.   Pt also has right pinky injury. He cut it yesterday with a clean knife while chopping food. It bled for 20 minutes but stopped after direct pressure was applied. Pt has cleaned the wound very well and had no issues with bleeding since. He denies any decreased ROM, pain, bruising, decreased sensation, numbness, or tingling. Pt does not know when his last tetanus was.   Review of Systems  Constitutional: Negative for fatigue and fever.  Gastrointestinal: Negative for diarrhea and vomiting.  Musculoskeletal: Negative for back pain.  Neurological: Negative for numbness.  Psychiatric/Behavioral: Negative for agitation.   There are no active problems to display for this patient.   Current Outpatient Prescriptions on File Prior to Visit  Medication Sig Dispense Refill  . lamoTRIgine (LAMICTAL) 100 MG tablet Take 100 mg by mouth daily.    . traMADol (ULTRAM) 50 MG tablet Take 1 tablet (50 mg total) by mouth every 6 (six) hours as needed. 30 tablet 1  . HYDROcodone-acetaminophen (NORCO/VICODIN) 5-325 MG tablet Take 1 tablet by mouth every 6 (six) hours as needed for moderate pain. (Patient not taking: Reported  on 02/21/2016) 20 tablet 0  . hyoscyamine (LEVBID) 0.375 MG 12 hr tablet Take 1 tablet (0.375 mg total) by mouth 2 (two) times daily. (Patient not taking: Reported on 02/21/2016) 60 tablet 6  . Multiple Vitamins-Minerals (MULTIVITAMIN WITH MINERALS) tablet Take 1 tablet by mouth daily.    . Probiotic Product (PROBIOTIC PO) Take by mouth.    Marland Kitchen UNABLE TO FIND Take 1,500 mg by mouth. Med Name: cinnamon     No current facility-administered medications on file prior to visit.     Allergies  Allergen Reactions  . Keflex [Cephalexin]   . Relafen [Nabumetone]     Blood in stool    Objective:  BP 120/82 (BP Location: Right Arm, Patient Position: Sitting, Cuff Size: Large)   Pulse 86   Temp 98.4 F (36.9 C) (Oral)   Resp 17   Ht 6' (1.829 m)   Wt (!) 301 lb (136.5 kg)   SpO2 97%   BMI 40.82 kg/m   Physical Exam  Constitutional: He is oriented to person, place, and time and well-developed, well-nourished, and in no distress.  HENT:  Head: Normocephalic and atraumatic.  Eyes: Conjunctivae are normal.  Neck: Normal range of motion.  Pulmonary/Chest: Effort normal and breath sounds normal.  Abdominal: Soft. Normal aorta and bowel sounds are normal. There is tenderness in the suprapubic area. There is no CVA tenderness.  Genitourinary:  Genitourinary Comments: Prostate exam deferred  Musculoskeletal:       Hands: Neurological: He is alert and oriented to person, place, and time. Gait normal.  Skin: Skin is  warm and dry.  Psychiatric: Affect normal.  Vitals reviewed.    Results for orders placed or performed in visit on 02/21/16 (from the past 24 hour(s))  POCT urinalysis dipstick     Status: Abnormal   Collection Time: 02/21/16  5:14 PM  Result Value Ref Range   Color, UA yellow yellow   Clarity, UA clear clear   Glucose, UA negative negative   Bilirubin, UA negative negative   Ketones, POC UA negative negative   Spec Grav, UA 1.015    Blood, UA trace-intact (A) negative   pH,  UA 8.5    Protein Ur, POC trace (A) negative   Urobilinogen, UA 0.2    Nitrite, UA Negative Negative   Leukocytes, UA Trace (A) Negative  POCT Microscopic Urinalysis ([facility])     Status: None   Collection Time: 02/21/16  5:22 PM  Result Value Ref Range   WBC,UR,HPF,POC None None WBC/hpf   RBC,UR,HPF,POC None None RBC/hpf   Bacteria None None, Too numerous to count   Mucus Absent Absent   Epithelial Cells, UR Per Microscopy None None, Too numerous to count cells/hpf     Assessment and Plan :  1. Difficulty urinating - PSA - POCT urinalysis dipstick - POCT Microscopic Urinalysis ([facility]) - GC/Chlamydia Probe Amp - Urine culture -Pt deferred prostate exam due to [place] giving him a referral to urology, he states he will wait for them to perform one - Ambulatory referral to Urology  2. Cut of finger -Healing well, no need for steri strips or sutures, recommend bandaid - Tdap vaccine greater than or equal to 7yo IM    Tenna Delaine PA-C  [facility] 02/21/2016 5:28 PM

## 2016-02-22 LAB — GC/CHLAMYDIA PROBE AMP
CT Probe RNA: NOT DETECTED
GC Probe RNA: NOT DETECTED

## 2016-02-22 LAB — URINE CULTURE: Organism ID, Bacteria: NO GROWTH

## 2016-03-12 ENCOUNTER — Encounter: Payer: Self-pay | Admitting: Physician Assistant

## 2016-03-12 ENCOUNTER — Ambulatory Visit (INDEPENDENT_AMBULATORY_CARE_PROVIDER_SITE_OTHER): Payer: Managed Care, Other (non HMO) | Admitting: Family Medicine

## 2016-03-12 VITALS — BP 132/80 | HR 96 | Temp 98.5°F | Resp 18 | Ht 72.0 in | Wt 301.0 lb

## 2016-03-12 DIAGNOSIS — R1032 Left lower quadrant pain: Secondary | ICD-10-CM | POA: Diagnosis not present

## 2016-03-12 MED ORDER — PB-HYOSCY-ATROPINE-SCOPOLAMINE 16.2 MG PO TABS: 1.0000 | ORAL_TABLET | Freq: Three times a day (TID) | ORAL | 1 refills | Status: DC | PRN

## 2016-03-12 MED ORDER — TRAMADOL HCL 50 MG PO TABS: 50.0000 mg | ORAL_TABLET | Freq: Four times a day (QID) | ORAL | 1 refills | Status: DC | PRN

## 2016-03-12 NOTE — Patient Instructions (Addendum)
I have refilled your medications today.    I'll fill out the [facility] paperwork as well.    It was good to meet you today!    IF you received an x-ray today, you will receive an invoice from [facility]. Please contact [facility] Radiology at [phone] with questions or concerns regarding your invoice.   IF you received labwork today, you will receive an invoice from [employer]. Please contact [facility] at [phone] with questions or concerns regarding your invoice.   Our billing staff will not be able to assist you with questions regarding bills from these companies.  You will be contacted with the lab results as soon as they are available. The fastest way to get your results is to activate your My Chart account. Instructions are located on the last page of this paperwork. If you have not heard from [place] regarding the results in 2 weeks, please contact this office.

## 2016-03-12 NOTE — Progress Notes (Signed)
Marc Long is a 40 y.o. male who presents to [facility] today for FMLA completion for recurrent diverticulitis:  1.  Recurrent diverticulitis: Patient diagnosed with this back in March of this year. Has fairly frequent attacks during which she describes left lower quadrant abdominal pain. Pain is sharp and stabbing but as it worsens or progresses to dull constant pain. Somewhat relieved with tramadol. He also takes Donnatal for relief.    Had 2 attacks this month. Most recently Sunday which lasted until yesterday. Better. Still with very mild pain today. During his attacks he describes no nausea vomiting but left lower quadrant pain as described above. Often accompanied with constipation but sometimes with diarrhea.  Last attack was back in June he has had several months without attack.  He has to miss work during these attacks and would like paper work completed to ensure he does not lose his job.  ROS as above.    PMH reviewed. Patient is a nonsmoker.   Past Medical History:  Diagnosis Date  . Depression   . Gallstones   . Obesity, Class III, BMI 40-49.9 (morbid obesity) ([place])    History reviewed. No pertinent surgical history.  Medications reviewed. Current Outpatient Prescriptions  Medication Sig Dispense Refill  . hyoscyamine (LEVBID) 0.375 MG 12 hr tablet Take 1 tablet (0.375 mg total) by mouth 2 (two) times daily. 60 tablet 6  . lamoTRIgine (LAMICTAL) 100 MG tablet Take 100 mg by mouth daily.    . Multiple Vitamins-Minerals (MULTIVITAMIN WITH MINERALS) tablet Take 1 tablet by mouth daily.    . Probiotic Product (PROBIOTIC PO) Take by mouth.    . traMADol (ULTRAM) 50 MG tablet Take 1 tablet (50 mg total) by mouth every 6 (six) hours as needed. 30 tablet 1  . UNABLE TO FIND Take 1,500 mg by mouth. Med Name: cinnamon    . belladona alk-PHENObarbital (DONNATAL) 16.2 MG tablet Take 1 tablet by mouth as needed.    Marland Kitchen HYDROcodone-acetaminophen (NORCO/VICODIN)  5-325 MG tablet Take 1 tablet by mouth every 6 (six) hours as needed for moderate pain. (Patient not taking: Reported on 03/12/2016) 20 tablet 0   No current facility-administered medications for this visit.      Physical Exam:  BP 132/80 (BP Location: Right Arm, Patient Position: Sitting, Cuff Size: Small)   Pulse 96   Temp 98.5 F (36.9 C) (Oral)   Resp 18   Ht 6' (1.829 m)   Wt (!) 301 lb (136.5 kg)   SpO2 97%   BMI 40.82 kg/m  Gen:  Alert, cooperative patient who appears stated age in no acute distress.  Vital signs reviewed. HEENT: EOMI,  MMM Pulm:  Clear to auscultation bilaterally with good air movement.  No wheezes or rales noted.   Cardiac:  Regular rate and rhythm without murmur auscultated.  Good S1/S2. Abd:  Soft/nondistended.  Minimal tenderness to left lower quadrant palpation. Exts: Non edematous BL  LE, warm and well perfused.   Assessment and Plan:  1.  FMLA paperwork: -Patient has chronic abdominal pain. Has episodes that occur anywhere from 1-2 times per month. He does have stretches where he does not have any pain. -I is followed by gastroenterology as well. -I have refilled his tramadol and Donnatal today.  #2. Questionable history of diverticulitis: -His white count is within normal in the past. Looks like he has not had a fever during any of his abdominal pain episodes.. He had a CT scan which showed  diverticulosis without clear indications for diverticulitis. -His ESR/sedimentation rate is always also been normal. -I wonder if this is not more irritable bowel or another perhaps inflammatory process. -Previously diagnosed with diverticulitis but above laboratories and imaging perhaps point to something else. - FU with PCP.   3.  Hematuria: - has appt scheduled with urology.  - States symptoms have resolved from prior visit.

## 2016-04-07 ENCOUNTER — Encounter: Payer: Self-pay | Admitting: Family Medicine

## 2016-04-07 ENCOUNTER — Ambulatory Visit (INDEPENDENT_AMBULATORY_CARE_PROVIDER_SITE_OTHER): Payer: Managed Care, Other (non HMO) | Admitting: Family Medicine

## 2016-04-07 VITALS — BP 124/90 | HR 78 | Temp 98.4°F | Resp 17 | Ht 72.0 in | Wt 303.0 lb

## 2016-04-07 DIAGNOSIS — K5730 Diverticulosis of large intestine without perforation or abscess without bleeding: Secondary | ICD-10-CM | POA: Diagnosis not present

## 2016-04-07 DIAGNOSIS — R1032 Left lower quadrant pain: Secondary | ICD-10-CM | POA: Diagnosis not present

## 2016-04-07 DIAGNOSIS — F321 Major depressive disorder, single episode, moderate: Secondary | ICD-10-CM

## 2016-04-07 NOTE — Progress Notes (Signed)
Chief Complaint  Patient presents with  . Diverticulosis    Flare up, needs note for work. PHQ9 score of 11    HPI   Pt has a history of diverticulosis He had a colonoscopy in April 2017 where he was evaluated for diarrhea and LLQ pain.  Colonoscopy showed divertulosis in the entire colon.  He reports that he gets intense pain in the LLQ that has been happening for 10/20 He reports that this is his 4th flare in the last couple of months He states that he took the Bentyl which did not help. He currently take Donnatal and Hycosamine He has changed his medications to include more fiber.  He denies blood per rectum, fevers or chills.   He would like to get a work note to get an accomodation at his job to allow him to get treatment during his flare up.    Pt reports that he is on Lamictal and recently had a dosage increase.  He is seeing Psychiatry and Psychology.  Depression screen [facility] 2/9 04/07/2016 03/12/2016 02/21/2016 11/02/2015 10/12/2015  Decreased Interest 3 0 0 1 2  Down, Depressed, Hopeless 2 0 0 3 2  PHQ - 2 Score 5 0 0 4 4  Altered sleeping 0 - - 0 2  Tired, decreased energy 1 - - 1 2  Change in appetite 1 - - 1 2  Feeling bad or failure about yourself  3 - - 1 2  Trouble concentrating 0 - - 0 0  Moving slowly or fidgety/restless 0 - - 0 0  Suicidal thoughts 1 - - 1 2  PHQ-9 Score 11 - - 8 14  Difficult doing work/chores - - - Not difficult at all Very difficult       Past Medical History:  Diagnosis Date  . Depression   . Gallstones   . Obesity, Class III, BMI 40-49.9 (morbid obesity) ([place])     Current Outpatient Prescriptions  Medication Sig Dispense Refill  . belladona alk-PHENObarbital (DONNATAL) 16.2 MG tablet Take 1 tablet by mouth every 8 (eight) hours as needed. 90 tablet 1  . lamoTRIgine (LAMICTAL) 100 MG tablet Take 100 mg by mouth daily.    . Multiple Vitamins-Minerals (MULTIVITAMIN WITH MINERALS) tablet Take 1 tablet by mouth daily.    . Probiotic  Product (PROBIOTIC PO) Take by mouth.    . traMADol (ULTRAM) 50 MG tablet Take 1 tablet (50 mg total) by mouth every 6 (six) hours as needed. 30 tablet 1  . UNABLE TO FIND Take 1,500 mg by mouth. Med Name: cinnamon    . HYDROcodone-acetaminophen (NORCO/VICODIN) 5-325 MG tablet Take 1 tablet by mouth every 6 (six) hours as needed for moderate pain. (Patient not taking: Reported on 04/07/2016) 20 tablet 0  . hyoscyamine (LEVBID) 0.375 MG 12 hr tablet Take 1 tablet (0.375 mg total) by mouth 2 (two) times daily. (Patient not taking: Reported on 04/07/2016) 60 tablet 6   No current facility-administered medications for this visit.     Allergies:  Allergies  Allergen Reactions  . Keflex [Cephalexin]   . Relafen [Nabumetone]     Blood in stool    No past surgical history on file.  Social History   Social History  . Marital status: Single    Spouse name: N/A  . Number of children: 0  . Years of education: N/A   Occupational History  . call center    Social History Main Topics  . Smoking status: Never Smoker  .  Smokeless tobacco: Never Used  . Alcohol use 0.0 oz/week     Comment: once every 2 weeks. Wine coolers  . Drug use: No  . Sexual activity: Not Asked   Other Topics Concern  . None   Social History Narrative  . None    ROS  Objective: Vitals:   04/07/16 1532  BP: 124/90  Pulse: 78  Resp: 17  Temp: 98.4 F (36.9 C)  TempSrc: Oral  SpO2: 98%  Weight: (!) 303 lb (137.4 kg)  Height: 6' (1.829 m)    Physical Exam  Constitutional: He is oriented to person, place, and time. He appears well-developed and well-nourished.  HENT:  Head: Atraumatic.  Eyes: Conjunctivae and EOM are normal.  Cardiovascular: Normal rate, regular rhythm and normal heart sounds.   No murmur heard. Pulmonary/Chest: Effort normal and breath sounds normal. No respiratory distress. He has no wheezes.  Abdominal: Normal appearance and bowel sounds are normal. He exhibits no distension.  There is tenderness in the left lower quadrant. There is no rigidity, no rebound, no guarding, no CVA tenderness, no tenderness at McBurney's point and negative Murphy's sign.  Neurological: He is alert and oriented to person, place, and time.  Psychiatric: He has a normal mood and affect. His behavior is normal. Judgment and thought content normal.     Assessment and Plan Marc Long was seen today for diverticulosis.  Diagnoses and all orders for this visit:  LLQ abdominal pain Diverticulosis of colon without hemorrhage -  Gave work note. Advised follow up if there is blood per rectum, fevers, worsening abdominal pain So far pt is improving   Moderate Depression - continue to follow up with Behavioral Health, improved on meds   [place]

## 2016-04-07 NOTE — Patient Instructions (Signed)
     IF you received an x-ray today, you will receive an invoice from [facility]. Please contact [facility] at [phone] with questions or concerns regarding your invoice.   IF you received labwork today, you will receive an invoice from [facility]. Please contact [facility] at [phone] with questions or concerns regarding your invoice.   Our billing staff will not be able to assist you with questions regarding bills from these companies.  You will be contacted with the lab results as soon as they are available. The fastest way to get your results is to activate your My Chart account. Instructions are located on the last page of this paperwork. If you have not heard from us regarding the results in 2 weeks, please contact this office.      

## 2016-04-14 ENCOUNTER — Ambulatory Visit (INDEPENDENT_AMBULATORY_CARE_PROVIDER_SITE_OTHER): Payer: Managed Care, Other (non HMO)

## 2016-04-14 ENCOUNTER — Ambulatory Visit (INDEPENDENT_AMBULATORY_CARE_PROVIDER_SITE_OTHER): Payer: Managed Care, Other (non HMO) | Admitting: Physician Assistant

## 2016-04-14 DIAGNOSIS — M25511 Pain in right shoulder: Secondary | ICD-10-CM | POA: Diagnosis not present

## 2016-04-14 DIAGNOSIS — M25519 Pain in unspecified shoulder: Secondary | ICD-10-CM | POA: Insufficient documentation

## 2016-04-14 NOTE — Progress Notes (Signed)
Patient ID: Marc Long, male    DOB: [birth year], 40 y.o.   MRN: [id number]  PCP: [name], MD  Subjective:   Chief Complaint  Patient presents with  . Fall    Down stairs this am and injured Rt shoulder  . Paper work filled out    For stomach issues    HPI Presents for evaluation of RIGHT shoulder pain.  This morning about 7:50 am he was descending the stairs when he over stepped the first step and his foot slid off. He tumbled to the bottom of the stairs amnd his RIGHT arm was behind him, pulled backward. The shoulder was painful (sore and tight) and so he applied ice, took ibuprofen and tramadol immediately, with some relief.    He notes a history of RIGHT shoulder subluxation and dislocation from boxing and martial arts injuries.  Mild swelling. No redness, increased warmth.  He is RIGHT hand dominant.    Review of Systems As above. No head injury or other injury. No numbness or tingling. No weakness.    Patient Active Problem List   Diagnosis Date Noted  . Pain in joint, shoulder region 04/14/2016     Prior to Admission medications   Medication Sig Start Date End Date Taking? Authorizing Provider  belladona alk-PHENObarbital (DONNATAL) 16.2 MG tablet Take 1 tablet by mouth every 8 (eight) hours as needed. 03/12/16  Yes Alveda Reasons, MD  lamoTRIgine (LAMICTAL) 100 MG tablet Take 100 mg by mouth daily.   Yes Historical Provider, MD  Multiple Vitamins-Minerals (MULTIVITAMIN WITH MINERALS) tablet Take 1 tablet by mouth daily.   Yes Historical Provider, MD  Probiotic Product (PROBIOTIC PO) Take by mouth.   Yes Historical Provider, MD  traMADol (ULTRAM) 50 MG tablet Take 1 tablet (50 mg total) by mouth every 6 (six) hours as needed. 03/12/16  Yes Alveda Reasons, MD  UNABLE TO FIND Take 1,500 mg by mouth. Med Name: cinnamon   Yes Historical Provider, MD  HYDROcodone-acetaminophen (NORCO/VICODIN) 5-325 MG tablet Take 1 tablet by mouth every 6 (six) hours as  needed for moderate pain. Patient not taking: Reported on 04/14/2016 11/02/15   [name], MD     Allergies  Allergen Reactions  . Bee Venom Swelling  . Keflex [Cephalexin]   . Relafen [Nabumetone]     Blood in stool  . Shellfish Allergy Nausea Only       Objective:  Physical Exam  Constitutional: He is oriented to person, place, and time. He appears well-developed and well-nourished. He is active and cooperative. No distress.  BP 110/86 (BP Location: Left Arm, Patient Position: Sitting, Cuff Size: Large)   Pulse 69   Temp 97.9 F (36.6 C) (Oral)   Resp 17   Ht 6' (1.829 m)   Wt (!) 304 lb (137.9 kg)   SpO2 97%   BMI 41.23 kg/m   HENT:  Head: Normocephalic and atraumatic.  Right Ear: Hearing normal.  Left Ear: Hearing normal.  Eyes: Conjunctivae are normal. No scleral icterus.  Neck: Normal range of motion. Neck supple. No thyromegaly present.  Cardiovascular: Normal rate, regular rhythm and normal heart sounds.   Pulses:      Radial pulses are 2+ on the right side, and 2+ on the left side.  Pulmonary/Chest: Effort normal and breath sounds normal.  Musculoskeletal:       Right shoulder: He exhibits tenderness, bony tenderness and pain. He exhibits normal range of motion, no swelling, no effusion, no crepitus, no  deformity, no laceration, no spasm, normal pulse and normal strength.       Left shoulder: Normal.       Right elbow: Normal.      Right wrist: Normal.  RIGHT shoulder is diffusely tender. FROM, but with pain.  Lymphadenopathy:       Head (right side): No tonsillar, no preauricular, no posterior auricular and no occipital adenopathy present.       Head (left side): No tonsillar, no preauricular, no posterior auricular and no occipital adenopathy present.    He has no cervical adenopathy.       Right: No supraclavicular adenopathy present.       Left: No supraclavicular adenopathy present.  Neurological: He is alert and oriented to person, place, and time. No  sensory deficit.  Skin: Skin is warm, dry and intact. No rash noted. No cyanosis or erythema. Nails show no clubbing.  Psychiatric: He has a normal mood and affect. His speech is normal and behavior is normal.      Dg Shoulder Right  Result Date: 04/14/2016 CLINICAL DATA:  Right shoulder pain.  History of prior dislocation. EXAM: RIGHT SHOULDER - 2+ VIEW COMPARISON:  None. FINDINGS: The joint spaces are maintained. No acute bony findings or bone lesion. No abnormal soft tissue calcifications. The visualized lung is clear and the visualized ribs are intact. IMPRESSION: Normal right shoulder radiographs. Electronically Signed   By: Marijo Sanes M.D.   On: 04/14/2016 17:13        Assessment & Plan:   1. Pain in joint of right shoulder Reassuring radiographs, but internal derangement still possible. Continue ibuprofen, Tramadol and ice. Rest. Once the acute injury is improved, re-evaluate to ascertain tendinous tear, etc. May warrant orthopedics referral. - DG Shoulder Right; Future   Fara Chute, PA-C Physician Assistant-Certified [facility]

## 2016-04-14 NOTE — Patient Instructions (Addendum)
You can use the ibuprofen and tramadol that you have. Rest your shoulder, and apply ice for the next several days. Once the acute pain has resolved, we'll see where you have persistent pain and determine the next step.    IF you received an x-ray today, you will receive an invoice from [facility]. Please contact [facility] at [phone] with questions or concerns regarding your invoice.   IF you received labwork today, you will receive an invoice from [facility]. Please contact [facility] at [phone] with questions or concerns regarding your invoice.   Our billing staff will not be able to assist you with questions regarding bills from these companies.  You will be contacted with the lab results as soon as they are available. The fastest way to get your results is to activate your My Chart account. Instructions are located on the last page of this paperwork. If you have not heard from [place] regarding the results in 2 weeks, please contact this office.

## 2016-04-14 NOTE — Progress Notes (Signed)
Subjective:    Patient ID: Marc Long, male    DOB: [birth year], 40 y.o.   MRN: [id number]  HPI: Presents for injury to right shoulder that occurred this morning around 7:50 am then he stepped too far off the first step and slid down the stairs. States he twisted and turned a few times (started out facing away from steps and ended up facing toward steps) and tried to use his right arm to stop him along the way which resulted in his right arm/shoulder being pulled backward behind him. Notes the right shoulder felt very sore and tight afterwards. Notes a little bit of swelling, denies redness or warmth. States he took Ultram and Ibuprofen and applied ice immediately after it happened which provided some relief. States he has a history of subluxation and dislocation of his right shoulder in the past several times with boxing and martial arts injuries.    Allergies  Allergen Reactions  . Bee Venom Swelling  . Keflex [Cephalexin]   . Relafen [Nabumetone]     Blood in stool  . Shellfish Allergy Nausea Only   Prior to Admission medications   Medication Sig Start Date End Date Taking? Authorizing Provider  belladona alk-PHENObarbital (DONNATAL) 16.2 MG tablet Take 1 tablet by mouth every 8 (eight) hours as needed. 03/12/16  Yes Alveda Reasons, MD  lamoTRIgine (LAMICTAL) 100 MG tablet Take 100 mg by mouth daily.   Yes Historical Provider, MD  Multiple Vitamins-Minerals (MULTIVITAMIN WITH MINERALS) tablet Take 1 tablet by mouth daily.   Yes Historical Provider, MD  Probiotic Product (PROBIOTIC PO) Take by mouth.   Yes Historical Provider, MD  traMADol (ULTRAM) 50 MG tablet Take 1 tablet (50 mg total) by mouth every 6 (six) hours as needed. 03/12/16  Yes Alveda Reasons, MD  UNABLE TO FIND Take 1,500 mg by mouth. Med Name: cinnamon   Yes Historical Provider, MD  HYDROcodone-acetaminophen (NORCO/VICODIN) 5-325 MG tablet Take 1 tablet by mouth every 6 (six) hours as needed for moderate pain. Patient  not taking: Reported on 04/14/2016 11/02/15   [name], MD   Patient Active Problem List   Diagnosis Date Noted  . Pain in joint, shoulder region 04/14/2016    Review of Systems  Constitutional: Negative for appetite change, chills, diaphoresis and fever.  HENT: Negative for congestion, ear pain, postnasal drip, rhinorrhea and sore throat.   Respiratory: Negative for cough, chest tightness and shortness of breath.   Cardiovascular: Negative for chest pain and palpitations.  Gastrointestinal: Negative for abdominal pain, blood in stool, constipation, diarrhea, nausea and vomiting.  Genitourinary: Negative for dysuria, frequency, hematuria and urgency.  Musculoskeletal: Negative for back pain.  Neurological: Negative for weakness and headaches.      Objective: Blood pressure 110/86, pulse 69, temperature 97.9 F (36.6 C), temperature source Oral, resp. rate 17, height 6' (1.829 m), weight (!) 304 lb (137.9 kg), SpO2 97 %.   Physical Exam  Constitutional: He is oriented to person, place, and time. He appears well-developed and well-nourished.  HENT:  Head: Normocephalic and atraumatic.  Neck: Normal range of motion. Neck supple. No tracheal deviation present. No thyromegaly present.  Cardiovascular: Intact distal pulses.   Musculoskeletal:       Right shoulder: He exhibits tenderness and pain. He exhibits normal range of motion, no bony tenderness, no swelling, no effusion, no deformity, normal pulse and normal strength.       Left shoulder: He exhibits normal range of motion, no tenderness, no  bony tenderness, no swelling, no effusion, no deformity, no laceration, no pain, normal pulse and normal strength.       Right elbow: Normal.      Left elbow: Normal.       Cervical back: Normal. He exhibits normal range of motion, no tenderness and no swelling.       Arms: Pain with overhead extension of right shoulder actively and against resistance.  No pain with or inability to perform  Apley scratch test. Pain in right shoulder with empty can test but able to perform appropriately.  Pain noted in right shoulder with elbow flexion and extension against resistance. Strength 4/5 in right shoulder compared to 5/5 in left shoulder due to pain with movements.   Neurological: He is alert and oriented to person, place, and time.  Skin: Skin is warm and dry.  Psychiatric: He has a normal mood and affect. His behavior is normal.   Dg Shoulder Right  Result Date: 04/14/2016 CLINICAL DATA:  Right shoulder pain.  History of prior dislocation. EXAM: RIGHT SHOULDER - 2+ VIEW COMPARISON:  None. FINDINGS: The joint spaces are maintained. No acute bony findings or bone lesion. No abnormal soft tissue calcifications. The visualized lung is clear and the visualized ribs are intact. IMPRESSION: Normal right shoulder radiographs. Electronically Signed   By: Marijo Sanes M.D.   On: 04/14/2016 17:13         Assessment & Plan:  1. Pain in joint of right shoulder Right shoulder x-ray without acute abnormalities. Continue using Ibuprofen and Tramadol you have as needed for the pain and inflammation in the right shoulder. Rest shoulder and apply ice as needed for pain and inflammation. RTC if persistent pain once acute pain has resolved and next steps will be determined from symptoms still present. - DG Shoulder Right; Future

## 2016-04-15 ENCOUNTER — Telehealth: Payer: Self-pay

## 2016-04-15 ENCOUNTER — Encounter: Payer: Self-pay | Admitting: Physician Assistant

## 2016-04-15 ENCOUNTER — Ambulatory Visit (INDEPENDENT_AMBULATORY_CARE_PROVIDER_SITE_OTHER): Payer: Managed Care, Other (non HMO) | Admitting: Physician Assistant

## 2016-04-15 VITALS — BP 118/84 | HR 66 | Temp 98.6°F | Resp 16 | Ht 71.75 in | Wt 302.4 lb

## 2016-04-15 DIAGNOSIS — R202 Paresthesia of skin: Secondary | ICD-10-CM

## 2016-04-15 DIAGNOSIS — R42 Dizziness and giddiness: Secondary | ICD-10-CM | POA: Diagnosis not present

## 2016-04-15 DIAGNOSIS — F321 Major depressive disorder, single episode, moderate: Secondary | ICD-10-CM | POA: Insufficient documentation

## 2016-04-15 LAB — COMPREHENSIVE METABOLIC PANEL
ALK PHOS: 50 U/L (ref 40–115)
ALT: 28 U/L (ref 9–46)
AST: 25 U/L (ref 10–40)
Albumin: 4.3 g/dL (ref 3.6–5.1)
BUN: 11 mg/dL (ref 7–25)
CHLORIDE: 106 mmol/L (ref 98–110)
CO2: 29 mmol/L (ref 20–31)
Calcium: 9.4 mg/dL (ref 8.6–10.3)
Creat: 1.31 mg/dL (ref 0.60–1.35)
GLUCOSE: 72 mg/dL (ref 65–99)
POTASSIUM: 4.2 mmol/L (ref 3.5–5.3)
Sodium: 143 mmol/L (ref 135–146)
Total Bilirubin: 0.4 mg/dL (ref 0.2–1.2)
Total Protein: 7.1 g/dL (ref 6.1–8.1)

## 2016-04-15 LAB — CBC WITH DIFFERENTIAL/PLATELET
BASOS PCT: 1 %
Basophils Absolute: 63 cells/uL (ref 0–200)
EOS ABS: 504 {cells}/uL — AB (ref 15–500)
Eosinophils Relative: 8 %
HCT: 43.4 % (ref 38.5–50.0)
Hemoglobin: 14.1 g/dL (ref 13.2–17.1)
LYMPHS PCT: 28 %
Lymphs Abs: 1764 cells/uL (ref 850–3900)
MCH: 28.5 pg (ref 27.0–33.0)
MCHC: 32.5 g/dL (ref 32.0–36.0)
MCV: 87.7 fL (ref 80.0–100.0)
MONOS PCT: 8 %
MPV: 9.5 fL (ref 7.5–12.5)
Monocytes Absolute: 504 cells/uL (ref 200–950)
Neutro Abs: 3465 cells/uL (ref 1500–7800)
Neutrophils Relative %: 55 %
PLATELETS: 339 10*3/uL (ref 140–400)
RBC: 4.95 MIL/uL (ref 4.20–5.80)
RDW: 14.1 % (ref 11.0–15.0)
WBC: 6.3 10*3/uL (ref 3.8–10.8)

## 2016-04-15 LAB — POCT URINALYSIS DIP (MANUAL ENTRY)
BILIRUBIN UA: NEGATIVE
Blood, UA: NEGATIVE
GLUCOSE UA: NEGATIVE
LEUKOCYTES UA: NEGATIVE
NITRITE UA: NEGATIVE
Protein Ur, POC: NEGATIVE
Spec Grav, UA: 1.025
Urobilinogen, UA: 0.2
pH, UA: 5.5

## 2016-04-15 LAB — TSH: TSH: 2.8 mIU/L (ref 0.40–4.50)

## 2016-04-15 NOTE — Patient Instructions (Addendum)
So far, everything is normal. This may be a combination of being out of lamotrigine (Lamictal) and taking the tramadol. Be sure to get plenty of rest and drink plenty of water. If your symptoms worsen, go to the emergency department.    IF you received an x-ray today, you will receive an invoice from [facility]. Please contact [facility] at [phone] with questions or concerns regarding your invoice.   IF you received labwork today, you will receive an invoice from [facility]. Please contact [facility] at [phone] with questions or concerns regarding your invoice.   Our billing staff will not be able to assist you with questions regarding bills from these companies.  You will be contacted with the lab results as soon as they are available. The fastest way to get your results is to activate your My Chart account. Instructions are located on the last page of this paperwork. If you have not heard from [place] regarding the results in 2 weeks, please contact this office.

## 2016-04-15 NOTE — Telephone Encounter (Signed)
Spoke with patient today. He clarified that the FMLA forms are related to the diverticulitis, not the shoulder injury. Please put new/blank forms in Dr. Cindra Presume box!

## 2016-04-15 NOTE — Progress Notes (Signed)
Patient ID: Marc Long, male    DOB: 1975/12/10, 40 y.o.   MRN: [id number]  PCP: [name], MD  Subjective:   Chief Complaint  Patient presents with  . Follow-up    RIGHT SHOULDER INJURY  . Depression    POSITIVE ANSWERS IN TRIAGE **SEE LAST ANSWER**    HPI Presents for evaluation of dizziness.  He was seen yesterday after a fall down stairs, in which he injured his RIGHT shoulder. Immediately following the injury, he applied ice, took ibuprofen and Tramadol.  He then presented here yesterday afternoon. At that time he reported no dizziness or HA, and denied having injured his head, lost consciousness, etc.  Today he reports that dizziness began shortly after he took the tramadol yesterday, and he took a nap before he came into the office. He reports that he didn't mention the symptom, thinking it would resolve.  When it remained today, he decided he should let us know, and we advised him to come back in for evaluation.  Earlier today he brought in FMLA forms to be completed regarding diverticulitis. He is employed at a local call center.  He reports milder dizziness with tramadol sometimes in the past.  He also notes that he has not taken lamotrigine in the past week, but has found the printed prescription he was given and plans to fill it today  He again denies injuring his head yesterday.  The dizziness is really a lightheadedness, a slightly off balance feeling. No associated HA, vision changes, CP, SOB. No nausea/vomiting/diarrhea. No urinary urgency, frequency, burning, hematuria. No increased thirst.  He does describe feeling tingling on the RIGHT side of his face, then on the LEFT side of the face during exam.  No weakness of the upper or lower extremities. The shoulder pain is essentially unchanged.    Review of Systems As above. His psychiatrist is treating his depression. Lamotrigine is being used as a mood stabilizer. He has suicidal thoughts, but no  plan.   Depression screen [facility] 2/9 04/15/2016 04/14/2016 04/07/2016 03/12/2016 02/21/2016  Decreased Interest '3 1 3 '$ 0 0  Down, Depressed, Hopeless '2 2 2 '$ 0 0  PHQ - 2 Score '5 3 5 '$ 0 0  Altered sleeping 0 0 0 - -  Tired, decreased energy '1 1 1 '$ - -  Change in appetite '2 1 1 '$ - -  Feeling bad or failure about yourself  '2 2 3 '$ - -  Trouble concentrating 0 0 0 - -  Moving slowly or fidgety/restless 0 0 0 - -  Suicidal thoughts '2 1 1 '$ - -  PHQ-9 Score '12 8 11 '$ - -  Difficult doing work/chores - Somewhat difficult - - -     Patient Active Problem List   Diagnosis Date Noted  . Moderate single current episode of major depressive disorder ([place]) 04/15/2016  . Pain in joint, shoulder region 04/14/2016     Prior to Admission medications   Medication Sig Start Date End Date Taking? Authorizing Provider  belladona alk-PHENObarbital (DONNATAL) 16.2 MG tablet Take 1 tablet by mouth every 8 (eight) hours as needed. 03/12/16  Yes Alveda Reasons, MD  lamoTRIgine (LAMICTAL) 100 MG tablet Take 100 mg by mouth daily.   Yes Historical Provider, MD  Multiple Vitamins-Minerals (MULTIVITAMIN WITH MINERALS) tablet Take 1 tablet by mouth daily.   Yes Historical Provider, MD  Probiotic Product (PROBIOTIC PO) Take by mouth.   Yes Historical Provider, MD  traMADol (ULTRAM) 50 MG tablet Take 1 tablet (50  mg total) by mouth every 6 (six) hours as needed. 03/12/16  Yes Alveda Reasons, MD  UNABLE TO FIND Take 1,500 mg by mouth. Med Name: cinnamon   Yes Historical Provider, MD  HYDROcodone-acetaminophen (NORCO/VICODIN) 5-325 MG tablet Take 1 tablet by mouth every 6 (six) hours as needed for moderate pain. Patient not taking: Reported on 04/15/2016 11/02/15   [name], MD     Allergies  Allergen Reactions  . Bee Venom Swelling  . Keflex [Cephalexin]   . Relafen [Nabumetone]     Blood in stool  . Shellfish Allergy Nausea Only       Objective:  Physical Exam  Constitutional: He is oriented to person, place, and  time. He appears well-developed and well-nourished. He is active and cooperative. No distress.  BP 118/84 (BP Location: Left Arm, Patient Position: Sitting, Cuff Size: Large)   Pulse 66   Temp 98.6 F (37 C) (Oral)   Resp 16   Ht 5' 11.75" (1.822 m)   Wt (!) 302 lb 6.4 oz (137.2 kg)   SpO2 96%   BMI 41.30 kg/m   HENT:  Head: Normocephalic and atraumatic.  Right Ear: Hearing normal.  Left Ear: Hearing normal.  Eyes: Conjunctivae are normal. No scleral icterus.  Neck: Normal range of motion. Neck supple. No thyromegaly present.  Cardiovascular: Normal rate, regular rhythm and normal heart sounds.   Pulses:      Radial pulses are 2+ on the right side, and 2+ on the left side.  Pulmonary/Chest: Effort normal and breath sounds normal.  Lymphadenopathy:       Head (right side): No tonsillar, no preauricular, no posterior auricular and no occipital adenopathy present.       Head (left side): No tonsillar, no preauricular, no posterior auricular and no occipital adenopathy present.    He has no cervical adenopathy.       Right: No supraclavicular adenopathy present.       Left: No supraclavicular adenopathy present.  Neurological: He is alert and oriented to person, place, and time. He has normal strength. No cranial nerve deficit or sensory deficit. He displays a negative Romberg sign.  Reflex Scores:      Patellar reflexes are 1+ on the right side and 1+ on the left side.      Achilles reflexes are 1+ on the right side and 1+ on the left side. Reports feeling as though he is leaning back when both sitting and standing  Skin: Skin is warm, dry and intact. No rash noted. No cyanosis or erythema. Nails show no clubbing.  Psychiatric: He has a normal mood and affect. His speech is normal and behavior is normal.    EKG reviewed with Dr. Tamala Julian. NSR. No arrhythmia, ischemia.   Orthostatic VS for the past 24 hrs:  BP- Lying Pulse- Lying BP- Sitting Pulse- Sitting BP- Standing at 0 minutes  Pulse- Standing at 0 minutes  04/15/16 1552 120/88 72 120/86 76 124/88 78    Results for orders placed or performed in visit on 04/15/16  POCT urinalysis dipstick  Result Value Ref Range   Color, UA yellow yellow   Clarity, UA clear clear   Glucose, UA negative negative   Bilirubin, UA negative negative   Ketones, POC UA trace (5) (A) negative   Spec Grav, UA 1.025    Blood, UA negative negative   pH, UA 5.5    Protein Ur, POC negative negative   Urobilinogen, UA 0.2  Nitrite, UA Negative Negative   Leukocytes, UA Negative Negative        Assessment & Plan:   1. Lightheadedness 2. Paresthesia Suspect that these symptoms represent a combination of being off the lamotrigine for the past week and taking the tramadol. Rest. Hydrate. Await remaining labs. EKG, orthostatics and UA dip today are reassuring. If symptoms persist, plan CT brain. - CBC with Differential/Platelet - Comprehensive metabolic panel - TSH - POCT urinalysis dipstick - Urine Microscopic - EKG 12-Lead - Orthostatic vital signs    Fara Chute, PA-C Physician Assistant-Certified [facility]

## 2016-04-15 NOTE — Telephone Encounter (Signed)
Ok that is fine if I need to shred those and get new blank ones I can I have them on file. Just let me know. Thank you

## 2016-04-15 NOTE — Telephone Encounter (Signed)
Patient asked about disability forms related to diverticulitis at his visit yesterday for shoulder pain related to a fall. I advised him to discuss the forms with the clerical staff who could advise him on the procedure for having these forms completed by Dr. Mingo Amber, who saw him to the diverticulitis.  It is of note that he called today reporting that he is still dizzy, though he did not report any dizziness at the visit yesterday. He was advised to RTC for evaluation of the dizziness, and is scheduled today at 2:15.  The FMLA papers will be addressed at that time.

## 2016-04-15 NOTE — Telephone Encounter (Signed)
I spoke to pt after consulting with Chelle and she said that she had not evaluated him for dizziness yesterday, that hadn't been a complaint. Pt reported that he thought the dizziness was due to the tramadol he took for pain, but now doesn't think it was that since he stopped the medication. Advised pt that he needs to come in for eval today and transferred to clerical to sch appt.

## 2016-04-15 NOTE — Telephone Encounter (Signed)
Patient needs FMLA forms completed by [facility] for his Right shoulder injury. I have completed what I could from the [place] notes and highlighted the areas that need to be completed. I will place the forms in your box on 04/15/16 if your could please return them to the FMLA/Disability box at the 102 checkout desk within 5-7 business days. Thank you!

## 2016-04-15 NOTE — Telephone Encounter (Signed)
Patient wanted to let Chelle know that his dizzy spells were not from his medicine, he stated that he has not taken any of the medication today or last night and he is still having them so he isn't sure what made him fall yesterday. He wants to know if he needs to come back in and see her again today or what she would like for him to do  His call back number is [phone]

## 2016-04-15 NOTE — Progress Notes (Signed)
Subjective:    Patient ID: Marc Long, male    DOB: Jul 05, 1975, 40 y.o.   MRN: [id number]  Chief Complaint  Patient presents with  . Follow-up    RIGHT SHOULDER INJURY  . Depression    POSITIVE ANSWERS IN TRIAGE **SEE LAST ANSWER**   HPI: Presents today for follow-up after he has noticed continued weakness, feeling unbalanced and dizziness after visit yesterday. Patient was seen in clinic yesterday after a fall down the steps where he injured his right shoulder. Denied dizziness, injury to head, or LOC at that time.   Pt states he had some weakness and lightheadedness he first noticed about 30 minutes after his fall yesterday but attributed this to the Tramadol he took immediately after his fall because he had felt a little "off" from that medication before. Today notes that after the fall he felt a little "off-balance" and "wobbly" but again, attributed this to the Tramadol. However, he states that on his way home after the visit yesterday, he continued to have these symptoms which made him realize it was not the medication that was causing it. States since leaving yesterday evening he has noticed his legs buckling more quickly.   Also states he had some tingling in his right cheek and face which he also first noticed yesterday evening and has had it off and on since then. Tingling moved to left side of face/cheek while I was in room. Denies pain, facial drooping, speech difficulty, or numbness. Denies headache, vision changes, blurry vision, or tingling in other parts of bodies except his face. Again denies injury to head or LOC yesterday during his fall and seems confident in this. States the steps he fell down were carpeted steps. Denies pre-syncopal events or syncope, hearing changes, trouble concentrating.   States he took his Tramadol again this morning around 8-8:30 am. Shoulder pain is the same as yesterday with some relief noted with Tramadol.  Currently seen by a psychiatrist for  his depression. States "because of a mix up" he ran out of his Lamictal and has been off of it for one week. Answered yes to several of the triage depression screening questions. However, denies current suicidal ideation or plan during interview.  Review of Systems  Constitutional: Positive for fatigue. Negative for appetite change, chills, diaphoresis and fever.  HENT: Negative for congestion, drooling, ear discharge, ear pain, hearing loss, nosebleeds, postnasal drip, rhinorrhea, sinus pressure, sneezing, sore throat, tinnitus, trouble swallowing and voice change.   Eyes: Negative for photophobia, pain, discharge, redness, itching and visual disturbance.  Respiratory: Negative for cough, chest tightness, shortness of breath and wheezing.   Cardiovascular: Negative for chest pain and palpitations.  Gastrointestinal: Negative for abdominal pain, blood in stool, constipation, diarrhea, nausea and vomiting.  Genitourinary: Negative for difficulty urinating, dysuria, frequency, hematuria and urgency.  Musculoskeletal: Positive for gait problem and myalgias. Negative for back pain, joint swelling, neck pain and neck stiffness.  Skin: Negative for rash.  Neurological: Positive for tremors and weakness. Negative for dizziness, seizures, syncope, facial asymmetry, speech difficulty, light-headedness, numbness and headaches.       Objective:   Physical Exam  Constitutional: He is oriented to person, place, and time. He appears well-developed and well-nourished.  HENT:  Head: Normocephalic and atraumatic.  Right Ear: External ear normal.  Left Ear: External ear normal.  Neck: Normal range of motion. Neck supple. No tracheal deviation present. No thyromegaly present.  Cardiovascular: Normal rate, regular rhythm, normal heart sounds and intact distal  pulses.  Exam reveals no gallop and no friction rub.   No murmur heard. Pulmonary/Chest: Effort normal and breath sounds normal. No respiratory distress.  He has no wheezes. He has no rales.  Musculoskeletal:       Arms: Lymphadenopathy:    He has no cervical adenopathy.  Neurological: He is alert and oriented to person, place, and time. He has normal strength. He is not disoriented. He displays no atrophy and no tremor. No cranial nerve deficit or sensory deficit. He exhibits normal muscle tone. He displays a negative Romberg sign. He displays no seizure activity. Coordination and gait normal. GCS eye subscore is 4. GCS verbal subscore is 5. GCS motor subscore is 6.  Reflex Scores:      Tricep reflexes are 1+ on the right side and 1+ on the left side.      Bicep reflexes are 1+ on the right side and 1+ on the left side.      Brachioradialis reflexes are 1+ on the right side and 1+ on the left side.      Patellar reflexes are 1+ on the right side and 1+ on the left side.      Achilles reflexes are 1+ on the right side and 1+ on the left side. Appropriate 3-word recall x 3. Facial sharp-dull sensation intact bilaterally. No TTP of both sides of face and scalp.  Cerebellar finger-to-nose, heel-to-shin, and alternating hand movement tests performed appropriately. Mildly unbalanced on left foot with one-foot stand test and with eyes closed and hands out in front of him.  Reflexes symmetric but diminished bilaterally.   Depression screen [facility] 2/9 04/15/2016 04/14/2016 04/07/2016 03/12/2016 02/21/2016  Decreased Interest 3 1 3  0 0  Down, Depressed, Hopeless 2 2 2  0 0  PHQ - 2 Score 5 3 5  0 0  Altered sleeping 0 0 0 - -  Tired, decreased energy 1 1 1  - -  Change in appetite 2 1 1  - -  Feeling bad or failure about yourself  2 2 3  - -  Trouble concentrating 0 0 0 - -  Moving slowly or fidgety/restless 0 0 0 - -  Suicidal thoughts 2 1 1  - -  PHQ-9 Score 12 8 11  - -  Difficult doing work/chores - Somewhat difficult - - -   Results for orders placed or performed in visit on 04/15/16  POCT urinalysis dipstick  Result Value Ref Range   Color, UA  yellow yellow   Clarity, UA clear clear   Glucose, UA negative negative   Bilirubin, UA negative negative   Ketones, POC UA trace (5) (A) negative   Spec Grav, UA 1.025    Blood, UA negative negative   pH, UA 5.5    Protein Ur, POC negative negative   Urobilinogen, UA 0.2    Nitrite, UA Negative Negative   Leukocytes, UA Negative Negative   Orthostatic VS for the past 24 hrs:  BP- Lying Pulse- Lying BP- Sitting Pulse- Sitting BP- Standing at 0 minutes Pulse- Standing at 0 minutes  04/15/16 1552 120/88 72 120/86 76 124/88 78   EKG: 64 bpm, normal sinus rhythm     Assessment & Plan:  1. Lightheadedness EKG unremarkable, orthostatic vitals normal, and UA only with trace ketones. TSH, CMP, and CBC still pending. Reviewed current results with patient and will update with pending lab results and change management as needed upon review of other labs. Advised rest and increased fluid intake. See below. Instructed patient to  go to the emergency department if symptoms are persistent or worsening and may need a head CT. - CBC with Differential/Platelet - Comprehensive metabolic panel - TSH - POCT urinalysis dipstick - Urine Microscopic - EKG 12-Lead - Orthostatic vital signs  2. Paresthesia Completely neurological exam unremarkable other than some mildly unsteady balance. EKG, orthostatic vitals, and UA within normal limits, other lab results pending. May be possible reaction to being off of Lamictal and taking Tramadol. Encouraged follow-up with psychiatrist regarding Lamictal refill. Advised rest and increased water intake. Instructed to RTC or go to emergency department if symptoms worsen or are not improving over the next few days.   Advised continued rest of shoulder and use of Tramadol with caution for pain relief, as well as ice as needed.

## 2016-04-16 LAB — URINALYSIS, MICROSCOPIC ONLY
BACTERIA UA: NONE SEEN [HPF]
CRYSTALS: NONE SEEN [HPF]
Casts: NONE SEEN [LPF]
RBC / HPF: NONE SEEN RBC/HPF (ref <=2)
SQUAMOUS EPITHELIAL / LPF: NONE SEEN [HPF] (ref <=5)
WBC UA: NONE SEEN WBC/HPF (ref <=5)
Yeast: NONE SEEN [HPF]

## 2016-04-21 ENCOUNTER — Telehealth: Payer: Self-pay

## 2016-04-21 DIAGNOSIS — Z0271 Encounter for disability determination: Secondary | ICD-10-CM

## 2016-04-21 NOTE — Telephone Encounter (Signed)
Patient needs FMLA forms completed by Dr Mingo Amber, for his diverticulitis. I have completed the forms the best I could from the [place] notes and highlighted the areas that need to be completed. I will place the forms in your box on 04/21/16 if you could please return them to the FMLA/Disability box at the 102 checkout desk with in 5-7 business days. Thank you

## 2016-04-22 NOTE — Telephone Encounter (Signed)
I will complete these when I'm back tomorrow.  Thanks, JW

## 2016-04-23 NOTE — Telephone Encounter (Signed)
I have completed this and Marc Long put it in the [facility] box.

## 2016-04-25 NOTE — Telephone Encounter (Signed)
Paperwork was scanned and faxed on 04/25/16

## 2016-07-21 ENCOUNTER — Ambulatory Visit (INDEPENDENT_AMBULATORY_CARE_PROVIDER_SITE_OTHER): Payer: Managed Care, Other (non HMO) | Admitting: Family Medicine

## 2016-07-21 VITALS — BP 134/92 | HR 83 | Temp 97.9°F | Resp 18 | Ht 71.75 in | Wt 325.0 lb

## 2016-07-21 DIAGNOSIS — R1084 Generalized abdominal pain: Secondary | ICD-10-CM

## 2016-07-21 LAB — POCT URINALYSIS DIP (MANUAL ENTRY)
Bilirubin, UA: NEGATIVE
GLUCOSE UA: NEGATIVE
Ketones, POC UA: NEGATIVE
Leukocytes, UA: NEGATIVE
NITRITE UA: NEGATIVE
PH UA: 6
Protein Ur, POC: NEGATIVE
RBC UA: NEGATIVE
SPEC GRAV UA: 1.015
UROBILINOGEN UA: 0.2

## 2016-07-21 MED ORDER — TRAMADOL HCL 50 MG PO TABS: 50.0000 mg | ORAL_TABLET | Freq: Four times a day (QID) | ORAL | 1 refills | Status: DC | PRN

## 2016-07-21 MED ORDER — OMEPRAZOLE 40 MG PO CPDR: 40.0000 mg | DELAYED_RELEASE_CAPSULE | Freq: Every day | ORAL | 1 refills | Status: DC

## 2016-07-21 NOTE — Progress Notes (Signed)
Patient ID: Marc Long, male    DOB: [birth year], 41 y.o.   MRN: [id number]  PCP: [name], MD  Chief Complaint  Patient presents with  . Abdominal Pain    SINCE FRIDAY   . Depression    Subjective:  HPI 41 year old male presents for evaluation of abdominal pain x 4 days. Past medical hx includes: depression and chronic intermittent abdominal pain related to a prior dx of diverticulitis in which he has been followed by Dr. Roney Mans of [facility]. Reports that he has experienced generalized mid abdominal pain and bloating. He has been taking over the counter anti-gas medication which hasn't relieved his symptoms. Denies diarrhea or loose stool. Patient reports increased fatigue although he feel this may be related to chronic depression disorder. He hasn't noticed any changes in his dietary intake. Pt is followed by psychiatry for major depression disorder and he has an upcoming appointment within 7 days. Reports no fever, nausea, or vomiting.  Social History   Social History  . Marital status: Single    Spouse name: N/A  . Number of children: 0  . Years of education: N/A   Occupational History  . call center    Social History Main Topics  . Smoking status: Never Smoker  . Smokeless tobacco: Never Used  . Alcohol use 0.0 oz/week     Comment: once every 2 weeks. Wine coolers  . Drug use: No  . Sexual activity: Yes    Birth control/ protection: None   Other Topics Concern  . Not on file   Social History Narrative  . No narrative on file    Family History  Problem Relation Age of Onset  . Hypertension Mother   . Depression Mother   . Heart attack Maternal Grandfather   . Lymphoma Maternal Grandmother   . Diabetes Maternal Aunt   . Cancer Maternal Aunt     Review of Systems See HPI Patient Active Problem List   Diagnosis Date Noted  . Moderate single current episode of major depressive disorder ([place]) 04/15/2016  . Pain in joint, shoulder region  04/14/2016    Allergies  Allergen Reactions  . Bee Venom Swelling  . Keflex [Cephalexin]   . Relafen [Nabumetone]     Blood in stool  . Shellfish Allergy Nausea Only    Prior to Admission medications   Medication Sig Start Date End Date Taking? Authorizing Provider  HYDROcodone-acetaminophen (NORCO/VICODIN) 5-325 MG tablet Take 1 tablet by mouth every 6 (six) hours as needed for moderate pain. 11/02/15  Yes [name], MD  lamoTRIgine (LAMICTAL) 100 MG tablet Take 100 mg by mouth daily.   Yes Historical Provider, MD  lithium 300 MG tablet Take 300 mg by mouth 3 (three) times daily.   Yes Historical Provider, MD  LORazepam (ATIVAN) 0.5 MG tablet Take 0.5 mg by mouth every 8 (eight) hours.   Yes Historical Provider, MD  Multiple Vitamins-Minerals (MULTIVITAMIN WITH MINERALS) tablet Take 1 tablet by mouth daily.   Yes Historical Provider, MD  Probiotic Product (PROBIOTIC PO) Take by mouth.   Yes Historical Provider, MD  UNABLE TO FIND Take 1,500 mg by mouth. Med Name: cinnamon   Yes Historical Provider, MD  belladona alk-PHENObarbital (DONNATAL) 16.2 MG tablet Take 1 tablet by mouth every 8 (eight) hours as needed. Patient not taking: Reported on 07/21/2016 03/12/16   Alveda Reasons, MD  traMADol (ULTRAM) 50 MG tablet Take 1 tablet (50 mg total) by mouth every 6 (  six) hours as needed. Patient not taking: Reported on 07/21/2016 03/12/16   Alveda Reasons, MD    Past Medical, Surgical Family and Social History reviewed and updated.    Objective:   Today's Vitals   07/21/16 1658  BP: (!) 134/92  Pulse: 83  Resp: 18  Temp: 97.9 F (36.6 C)  TempSrc: Oral  SpO2: 98%  Weight: (!) 325 lb (147.4 kg)  Height: 5' 11.75" (1.822 m)    Wt Readings from Last 3 Encounters:  07/21/16 (!) 325 lb (147.4 kg)  04/15/16 (!) 302 lb 6.4 oz (137.2 kg)  04/14/16 (!) 304 lb (137.9 kg)    Physical Exam  Constitutional: He is oriented to person, place, and time. He appears well-developed and  well-nourished.  HENT:  Head: Normocephalic and atraumatic.  Right Ear: External ear normal.  Left Ear: External ear normal.  Nose: Mucosal edema and rhinorrhea present.  Eyes: Pupils are equal, round, and reactive to light.  Cardiovascular: Normal rate, regular rhythm, normal heart sounds and intact distal pulses.   Pulmonary/Chest: Effort normal and breath sounds normal.  Musculoskeletal: Normal range of motion.  Neurological: He is alert and oriented to person, place, and time.  Skin: Skin is warm and dry.  Psychiatric: He has a normal mood and affect. His behavior is normal. Judgment and thought content normal.       Assessment & Plan:  1. Generalized abdominal pain -Tramadol for acute LLQ abdominal intermittent pain -Start Omeprazole 40 mg daily for acid reflux and bloating symptoms. -Resume probiotic for digestive health.  Return for follow-up if symptoms do not resolve.   Carroll Sage. Kenton Kingfisher, MSN, FNP-C [facility] [place]

## 2016-07-21 NOTE — Patient Instructions (Addendum)
Tramadol for acute LLQ abdominal pain. Start Omeprazole 40 mg daily for acid reflux symptoms. Resume probiotic for digestive health.   IF you received an x-ray today, you will receive an invoice from [facility]. Please contact [facility] at [phone] with questions or concerns regarding your invoice.   IF you received labwork today, you will receive an invoice from [place]. Please contact [facility] at [phone] with questions or concerns regarding your invoice.   Our billing staff will not be able to assist you with questions regarding bills from these companies.  You will be contacted with the lab results as soon as they are available. The fastest way to get your results is to activate your My Chart account. Instructions are located on the last page of this paperwork. If you have not heard from [place] regarding the results in 2 weeks, please contact this office.      Food Choices for Gastroesophageal Reflux Disease, Adult When you have gastroesophageal reflux disease (GERD), the foods you eat and your eating habits are very important. Choosing the right foods can help ease your discomfort. What guidelines do I need to follow?  Choose fruits, vegetables, whole grains, and low-fat dairy products.  Choose low-fat meat, fish, and poultry.  Limit fats such as oils, salad dressings, butter, nuts, and avocado.  Keep a food diary. This helps you identify foods that cause symptoms.  Avoid foods that cause symptoms. These may be different for everyone.  Eat small meals often instead of 3 large meals a day.  Eat your meals slowly, in a place where you are relaxed.  Limit fried foods.  Cook foods using methods other than frying.  Avoid drinking alcohol.  Avoid drinking large amounts of liquids with your meals.  Avoid bending over or lying down until 2-3 hours after eating. What foods are not recommended? These are some foods and drinks that may make your  symptoms worse: Vegetables  Tomatoes. Tomato juice. Tomato and spaghetti sauce. Chili peppers. Onion and garlic. Horseradish. Fruits  Oranges, grapefruit, and lemon (fruit and juice). Meats  High-fat meats, fish, and poultry. This includes hot dogs, ribs, ham, sausage, salami, and bacon. Dairy  Whole milk and chocolate milk. Sour cream. Cream. Butter. Ice cream. Cream cheese. Drinks  Coffee and tea. Bubbly (carbonated) drinks or energy drinks. Condiments  Hot sauce. Barbecue sauce. Sweets/Desserts  Chocolate and cocoa. Donuts. Peppermint and spearmint. Fats and Oils  High-fat foods. This includes [place] fries and potato chips. Other  Vinegar. Strong spices. This includes black pepper, white pepper, red pepper, cayenne, curry powder, cloves, ginger, and chili powder. The items listed above may not be a complete list of foods and drinks to avoid. Contact your dietitian for more information.  This information is not intended to replace advice given to you by your health care provider. Make sure you discuss any questions you have with your health care provider. Document Released: 12/02/2011 Document Revised: 11/08/2015 Document Reviewed: 04/06/2013 Elsevier Interactive Patient Education  2017 [employer].

## 2016-08-04 ENCOUNTER — Ambulatory Visit (INDEPENDENT_AMBULATORY_CARE_PROVIDER_SITE_OTHER): Payer: Managed Care, Other (non HMO) | Admitting: Emergency Medicine

## 2016-08-04 VITALS — BP 104/84 | HR 88 | Temp 97.8°F | Resp 18 | Ht 71.75 in | Wt 333.0 lb

## 2016-08-04 DIAGNOSIS — M791 Myalgia, unspecified site: Secondary | ICD-10-CM

## 2016-08-04 DIAGNOSIS — S76311A Strain of muscle, fascia and tendon of the posterior muscle group at thigh level, right thigh, initial encounter: Secondary | ICD-10-CM

## 2016-08-04 MED ORDER — METHOCARBAMOL 500 MG PO TABS: 500.0000 mg | ORAL_TABLET | Freq: Three times a day (TID) | ORAL | 0 refills | Status: AC

## 2016-08-04 MED ORDER — TRAMADOL HCL 50 MG PO TABS: 50.0000 mg | ORAL_TABLET | Freq: Four times a day (QID) | ORAL | 1 refills | Status: DC | PRN

## 2016-08-04 NOTE — Patient Instructions (Addendum)
IF you received an x-ray today, you will receive an invoice from [facility]. Please contact [facility] at [phone] with questions or concerns regarding your invoice.   IF you received labwork today, you will receive an invoice from [place]. Please contact [facility] at [phone] with questions or concerns regarding your invoice.   Our billing staff will not be able to assist you with questions regarding bills from these companies.  You will be contacted with the lab results as soon as they are available. The fastest way to get your results is to activate your My Chart account. Instructions are located on the last page of this paperwork. If you have not heard from [place] regarding the results in 2 weeks, please contact this office.      Hamstring Strain Introduction A hamstring strain is an injury that occurs when the hamstring muscles are overstretched or overloaded. The hamstring muscles are a group of muscles at the back of the thighs. These muscles are used in straightening the hips, bending the knees, and pulling back the legs. This type of injury is often called a pulled hamstring muscle. The severity of a muscle strain is rated in degrees. First-degree strains have the least amount of muscle fiber tearing and pain. Second-degree and third-degree strains have increasingly more tearing and pain. What are the causes? Hamstring strains occur when a sudden, violent force is placed on these muscles and stretches them too far. This often occurs during activities that involve running, jumping, kicking, or weight lifting. What increases the risk? Hamstring strains are especially common in athletes. Other things that can increase your risk for this injury include:  Having low strength, endurance, or flexibility of the hamstring muscles.  Performing high-impact physical activity.  Having poor physical fitness.  Having a previous leg injury.  Having fatigued  muscles.  Older age. What are the signs or symptoms?  Pain in the back of the thigh.  Bruising.  Swelling.  Muscle spasm.  Difficulty using the muscle because of pain or lack of normal function. For severe strains, you may have a popping or snapping feeling when the injury occurs. How is this diagnosed? Your health care provider will perform a physical exam and ask about your medical history. How is this treated? Often, the best treatment for a hamstring strain is protecting, resting, icing, applying compression, and elevating the injured area. This is referred to as the PRICE method of treatment. Your health care provider may also recommend medicines to help reduce pain or inflammation. Follow these instructions at home:  Use the PRICE method of treatment to promote muscle healing during the first 2-3 days after your injury. The PRICE method involves:  P-Protecting the muscle from being injured again.  R-Restricting your activity and resting the injured body part.  I-Icing your injury. To do this, put ice in a plastic bag. Place a towel between your skin and the bag. Then, apply the ice and leave it on for 20 minutes, 2-3 times per day. After the third day, switch to moist heat packs.  C-Applying compression to the injured area with an elastic bandage. Be careful not to wrap it too tightly. That may interfere with blood circulation or may increase swelling.  E-Elevating the injured body part above the level of your heart as often as you can. You can do this by putting a pillow under your thigh when you sit or lie down.  Take medicines only as directed by your health care  provider.  Begin exercising or stretching as directed by your health care provider.  Do not return to full activity level until your health care provider approves.  Keep all follow-up visits as directed by your health care provider. This is important. Contact a health care provider if:  You have increasing  pain or swelling in the injured area.  You have numbness, tingling, or a significant loss of strength in the injured area.  Your foot or your toes become cold or turn blue. This information is not intended to replace advice given to you by your health care provider. Make sure you discuss any questions you have with your health care provider. Document Released: 02/25/2001 Document Revised: 11/08/2015 Document Reviewed: 01/16/2014  2017 Elsevier

## 2016-08-04 NOTE — Progress Notes (Signed)
Marc Long 41 y.o.   Chief Complaint  Patient presents with  . Muscle Pain    pulled hamstring Saturday night     HISTORY OF PRESENT ILLNESS: This is a 41 y.o. male complaining of pulled right hamstring.  HPI   Prior to Admission medications   Medication Sig Start Date End Date Taking? Authorizing Provider  belladona alk-PHENObarbital (DONNATAL) 16.2 MG tablet Take 1 tablet by mouth every 8 (eight) hours as needed. Patient not taking: Reported on 07/21/2016 03/12/16   Alveda Reasons, MD  HYDROcodone-acetaminophen (NORCO/VICODIN) 5-325 MG tablet Take 1 tablet by mouth every 6 (six) hours as needed for moderate pain. Patient not taking: Reported on 08/04/2016 11/02/15   [name], MD  lamoTRIgine (LAMICTAL) 100 MG tablet Take 100 mg by mouth daily.    Historical Provider, MD  lithium 300 MG tablet Take 300 mg by mouth 3 (three) times daily.    Historical Provider, MD  LORazepam (ATIVAN) 0.5 MG tablet Take 0.5 mg by mouth every 8 (eight) hours.    Historical Provider, MD  methocarbamol (ROBAXIN) 500 MG tablet Take 1 tablet (500 mg total) by mouth 3 (three) times daily. 08/04/16 08/09/16  Horald Pollen, MD  Multiple Vitamins-Minerals (MULTIVITAMIN WITH MINERALS) tablet Take 1 tablet by mouth daily.    Historical Provider, MD  omeprazole (PRILOSEC) 40 MG capsule Take 1 capsule (40 mg total) by mouth daily. 07/21/16   Sedalia Muta, FNP  Probiotic Product (PROBIOTIC PO) Take by mouth.    Historical Provider, MD  traMADol (ULTRAM) 50 MG tablet Take 1 tablet (50 mg total) by mouth every 6 (six) hours as needed. 08/04/16   Horald Pollen, MD  UNABLE TO FIND Take 1,500 mg by mouth. Med Name: cinnamon    Historical Provider, MD    Allergies  Allergen Reactions  . Bee Venom Swelling  . Keflex [Cephalexin]   . Relafen [Nabumetone]     Blood in stool  . Shellfish Allergy Nausea Only    Patient Active Problem List   Diagnosis Date Noted  . Moderate single current  episode of major depressive disorder ([place]) 04/15/2016  . Pain in joint, shoulder region 04/14/2016    Past Medical History:  Diagnosis Date  . Depression   . Gallstones   . Obesity, Class III, BMI 40-49.9 (morbid obesity) ([place])     History reviewed. No pertinent surgical history.  Social History   Social History  . Marital status: Single    Spouse name: N/A  . Number of children: 0  . Years of education: N/A   Occupational History  . call center    Social History Main Topics  . Smoking status: Never Smoker  . Smokeless tobacco: Never Used  . Alcohol use 0.0 oz/week     Comment: once every 2 weeks. Wine coolers  . Drug use: No  . Sexual activity: Yes    Birth control/ protection: None   Other Topics Concern  . Not on file   Social History Narrative  . No narrative on file    Family History  Problem Relation Age of Onset  . Hypertension Mother   . Depression Mother   . Heart attack Maternal Grandfather   . Lymphoma Maternal Grandmother   . Diabetes Maternal Aunt   . Cancer Maternal Aunt      Review of Systems  Constitutional: Negative for chills and fever.  HENT: Negative.   Eyes: Negative.   Respiratory: Negative for cough.  Cardiovascular: Negative for chest pain, claudication and leg swelling.  Gastrointestinal: Negative for abdominal pain, nausea and vomiting.  Genitourinary: Negative for hematuria.  Musculoskeletal:       Pain to right hamstring.  Skin: Negative for rash.  Neurological: Negative for sensory change and focal weakness.  Endo/Heme/Allergies: Does not bruise/bleed easily.  All other systems reviewed and are negative.   Vitals:   08/04/16 1555  BP: 104/84  Pulse: 88  Resp: 18  Temp: 97.8 F (36.6 C)    Physical Exam  Constitutional: He is oriented to person, place, and time. He appears well-developed and well-nourished.  HENT:  Head: Normocephalic and atraumatic.  Mouth/Throat: Oropharynx is clear and moist. No  oropharyngeal exudate.  Eyes: Conjunctivae and EOM are normal. Pupils are equal, round, and reactive to light.  Neck: Normal range of motion. Neck supple.  Cardiovascular: Normal rate, regular rhythm and normal heart sounds.   Pulmonary/Chest: Effort normal and breath sounds normal.  Abdominal: Soft. Bowel sounds are normal. He exhibits no distension.  Musculoskeletal:  Right hamstring: +tenderness to deep palpation.  Neurological: He is alert and oriented to person, place, and time. He displays normal reflexes. No sensory deficit. He exhibits normal muscle tone.  Skin: Skin is warm and dry. Capillary refill takes less than 2 seconds.  Psychiatric: He has a normal mood and affect. His behavior is normal.  Vitals reviewed.    ASSESSMENT & PLAN: [name] was seen today for muscle pain.  Diagnoses and all orders for this visit:  Pulled hamstring, right, initial encounter  Muscle pain  Other orders -     methocarbamol (ROBAXIN) 500 MG tablet; Take 1 tablet (500 mg total) by mouth 3 (three) times daily. -     traMADol (ULTRAM) 50 MG tablet; Take 1 tablet (50 mg total) by mouth every 6 (six) hours as needed.    Patient Instructions       IF you received an x-ray today, you will receive an invoice from [facility]. Please contact [facility] at [phone] with questions or concerns regarding your invoice.   IF you received labwork today, you will receive an invoice from [place]. Please contact [facility] at [phone] with questions or concerns regarding your invoice.   Our billing staff will not be able to assist you with questions regarding bills from these companies.  You will be contacted with the lab results as soon as they are available. The fastest way to get your results is to activate your My Chart account. Instructions are located on the last page of this paperwork. If you have not heard from [place] regarding the results in 2 weeks, please contact this  office.      Hamstring Strain Introduction A hamstring strain is an injury that occurs when the hamstring muscles are overstretched or overloaded. The hamstring muscles are a group of muscles at the back of the thighs. These muscles are used in straightening the hips, bending the knees, and pulling back the legs. This type of injury is often called a pulled hamstring muscle. The severity of a muscle strain is rated in degrees. First-degree strains have the least amount of muscle fiber tearing and pain. Second-degree and third-degree strains have increasingly more tearing and pain. What are the causes? Hamstring strains occur when a sudden, violent force is placed on these muscles and stretches them too far. This often occurs during activities that involve running, jumping, kicking, or weight lifting. What increases the risk? Hamstring strains are especially common in  athletes. Other things that can increase your risk for this injury include:  Having low strength, endurance, or flexibility of the hamstring muscles.  Performing high-impact physical activity.  Having poor physical fitness.  Having a previous leg injury.  Having fatigued muscles.  Older age. What are the signs or symptoms?  Pain in the back of the thigh.  Bruising.  Swelling.  Muscle spasm.  Difficulty using the muscle because of pain or lack of normal function. For severe strains, you may have a popping or snapping feeling when the injury occurs. How is this diagnosed? Your health care provider will perform a physical exam and ask about your medical history. How is this treated? Often, the best treatment for a hamstring strain is protecting, resting, icing, applying compression, and elevating the injured area. This is referred to as the PRICE method of treatment. Your health care provider may also recommend medicines to help reduce pain or inflammation. Follow these instructions at home:  Use the PRICE method  of treatment to promote muscle healing during the first 2-3 days after your injury. The PRICE method involves:  P-Protecting the muscle from being injured again.  R-Restricting your activity and resting the injured body part.  I-Icing your injury. To do this, put ice in a plastic bag. Place a towel between your skin and the bag. Then, apply the ice and leave it on for 20 minutes, 2-3 times per day. After the third day, switch to moist heat packs.  C-Applying compression to the injured area with an elastic bandage. Be careful not to wrap it too tightly. That may interfere with blood circulation or may increase swelling.  E-Elevating the injured body part above the level of your heart as often as you can. You can do this by putting a pillow under your thigh when you sit or lie down.  Take medicines only as directed by your health care provider.  Begin exercising or stretching as directed by your health care provider.  Do not return to full activity level until your health care provider approves.  Keep all follow-up visits as directed by your health care provider. This is important. Contact a health care provider if:  You have increasing pain or swelling in the injured area.  You have numbness, tingling, or a significant loss of strength in the injured area.  Your foot or your toes become cold or turn blue. This information is not intended to replace advice given to you by your health care provider. Make sure you discuss any questions you have with your health care provider. Document Released: 02/25/2001 Document Revised: 11/08/2015 Document Reviewed: 01/16/2014  2017 Elsevier      Agustina Caroli, MD [facility]

## 2016-08-21 ENCOUNTER — Ambulatory Visit (INDEPENDENT_AMBULATORY_CARE_PROVIDER_SITE_OTHER): Payer: Self-pay | Admitting: Physician Assistant

## 2016-08-21 VITALS — BP 118/80 | HR 92 | Temp 98.1°F | Resp 18 | Ht 71.75 in | Wt 330.6 lb

## 2016-08-21 DIAGNOSIS — F3170 Bipolar disorder, currently in remission, most recent episode unspecified: Secondary | ICD-10-CM

## 2016-08-21 NOTE — Progress Notes (Signed)
Pt did not need an OV - he needed a DMV form filled out but it had already been filled out by his psychiatrist.

## 2016-08-21 NOTE — Patient Instructions (Signed)
     IF you received an x-ray today, you will receive an invoice from [facility]. Please contact [facility] at [phone] with questions or concerns regarding your invoice.   IF you received labwork today, you will receive an invoice from [facility]. Please contact [facility] at [phone] with questions or concerns regarding your invoice.   Our billing staff will not be able to assist you with questions regarding bills from these companies.  You will be contacted with the lab results as soon as they are available. The fastest way to get your results is to activate your My Chart account. Instructions are located on the last page of this paperwork. If you have not heard from us regarding the results in 2 weeks, please contact this office.     

## 2016-09-10 ENCOUNTER — Ambulatory Visit (INDEPENDENT_AMBULATORY_CARE_PROVIDER_SITE_OTHER): Payer: Managed Care, Other (non HMO) | Admitting: Physician Assistant

## 2016-09-10 VITALS — BP 134/83 | HR 94 | Temp 98.5°F | Resp 18 | Ht 71.75 in | Wt 331.4 lb

## 2016-09-10 DIAGNOSIS — H547 Unspecified visual loss: Secondary | ICD-10-CM

## 2016-09-10 DIAGNOSIS — Z973 Presence of spectacles and contact lenses: Secondary | ICD-10-CM

## 2016-09-10 DIAGNOSIS — F0630 Mood disorder due to known physiological condition, unspecified: Secondary | ICD-10-CM

## 2016-09-10 DIAGNOSIS — Z024 Encounter for examination for driving license: Secondary | ICD-10-CM

## 2016-09-10 NOTE — Progress Notes (Signed)
Marc Long  MRN: [id number] DOB: 01/28/1976  PCP: [name], MD  Subjective:  Pt is a 41 year old male who presents to clinic for here for DMV forms.  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  According to his paperwork, he has disability of "eye problem" and "mood disorder". Pt has a therapist who is filling out his behavioral health and substance abuse section. He suffers from MDD.  Wears glasses - last opthalmologist exam was about 10 years ago. Has not had a new prescription since that time  No complaints today.   Review of Systems  Eyes: Positive for visual disturbance (wears glasses).  Respiratory: Negative.   Cardiovascular: Negative.   Endocrine: Negative.   Musculoskeletal: Negative.   Neurological: Negative.   Psychiatric/Behavioral: Positive for behavioral problems and dysphoric mood.    Patient Active Problem List   Diagnosis Date Noted  . Moderate single current episode of major depressive disorder ([place]) 04/15/2016  . Pain in joint, shoulder region 04/14/2016    Current Outpatient Prescriptions on File Prior to Visit  Medication Sig Dispense Refill  . ARIPiprazole (ABILIFY PO) Take by mouth.    Marland Kitchen HYDROcodone-acetaminophen (NORCO/VICODIN) 5-325 MG tablet Take 1 tablet by mouth every 6 (six) hours as needed for moderate pain. 20 tablet 0  . lamoTRIgine (LAMICTAL) 100 MG tablet Take 100 mg by mouth daily.    Marland Kitchen lithium 300 MG tablet Take 300 mg by mouth 3 (three) times daily.    Marland Kitchen LORazepam (ATIVAN) 0.5 MG tablet Take 0.5 mg by mouth every 8 (eight) hours.    . Multiple Vitamins-Minerals (MULTIVITAMIN WITH MINERALS) tablet Take 1 tablet by mouth daily.    Marland Kitchen omeprazole (PRILOSEC) 40 MG capsule Take 1 capsule (40 mg total) by mouth daily. 90 capsule 1  . Probiotic Product (PROBIOTIC PO) Take by mouth.    . traMADol (ULTRAM) 50 MG tablet Take 1 tablet (50 mg total) by mouth every 6 (six) hours as needed. 15 tablet 1  . UNABLE TO  FIND Take 1,500 mg by mouth. Med Name: cinnamon    . belladona alk-PHENObarbital (DONNATAL) 16.2 MG tablet Take 1 tablet by mouth every 8 (eight) hours as needed. (Patient not taking: Reported on 09/10/2016) 90 tablet 1   No current facility-administered medications on file prior to visit.     Allergies  Allergen Reactions  . Bee Venom Swelling  . Keflex [Cephalexin]   . Relafen [Nabumetone]     Blood in stool  . Shellfish Allergy Nausea Only     Objective:  BP 134/83   Pulse 94   Temp 98.5 F (36.9 C) (Oral)   Resp 18   Ht 5' 11.75" (1.822 m)   Wt (!) 331 lb 6.4 oz (150.3 kg)   SpO2 97%   BMI 45.26 kg/m   Physical Exam  Constitutional: He is oriented to person, place, and time and well-developed, well-nourished, and in no distress. No distress.  Obese   Cardiovascular: Normal rate, regular rhythm and normal heart sounds.   Pulmonary/Chest: Effort normal. No respiratory distress.  Neurological: He is alert and oriented to person, place, and time. GCS score is 15.  Skin: Skin is warm and dry.  Psychiatric: Mood, memory, affect and judgment normal.  Vitals reviewed.   Assessment and Plan :  1. Encounter for examination for driving license 2. Mood disorder in conditions classified elsewhere 3. Decreased visual acuity 4. Wears glasses - Pt presents to have forms  filled out for [place] [employer] [facility] [employer] license application. This is my initial encounter with this patient. As per his previous visits here and my exam of pt today, he appears to be healthy enough to fulfill driving requirements. He is cleared to drive. His therapist will fill out mental health and substance abuse section. Advised pt to make appt with opthalmologist for current eye exam, as he has not had one in 10 years. He understands.   Mercer Pod, PA-C  [facility] 09/10/2016 3:01 PM

## 2016-09-10 NOTE — Patient Instructions (Signed)
     IF you received an x-ray today, you will receive an invoice from [facility]. Please contact [facility] at [phone] with questions or concerns regarding your invoice.   IF you received labwork today, you will receive an invoice from [facility]. Please contact [facility] at [phone] with questions or concerns regarding your invoice.   Our billing staff will not be able to assist you with questions regarding bills from these companies.  You will be contacted with the lab results as soon as they are available. The fastest way to get your results is to activate your My Chart account. Instructions are located on the last page of this paperwork. If you have not heard from us regarding the results in 2 weeks, please contact this office.     

## 2016-12-10 ENCOUNTER — Ambulatory Visit (INDEPENDENT_AMBULATORY_CARE_PROVIDER_SITE_OTHER): Payer: Managed Care, Other (non HMO) | Admitting: Physician Assistant

## 2016-12-10 ENCOUNTER — Encounter: Payer: Self-pay | Admitting: Physician Assistant

## 2016-12-10 VITALS — BP 142/90 | HR 102 | Temp 98.3°F | Resp 17 | Ht 72.0 in | Wt 323.0 lb

## 2016-12-10 DIAGNOSIS — R7309 Other abnormal glucose: Secondary | ICD-10-CM

## 2016-12-10 DIAGNOSIS — R350 Frequency of micturition: Secondary | ICD-10-CM

## 2016-12-10 DIAGNOSIS — Z794 Long term (current) use of insulin: Secondary | ICD-10-CM | POA: Insufficient documentation

## 2016-12-10 DIAGNOSIS — E119 Type 2 diabetes mellitus without complications: Secondary | ICD-10-CM

## 2016-12-10 DIAGNOSIS — Z79899 Other long term (current) drug therapy: Secondary | ICD-10-CM | POA: Insufficient documentation

## 2016-12-10 DIAGNOSIS — R030 Elevated blood-pressure reading, without diagnosis of hypertension: Secondary | ICD-10-CM | POA: Diagnosis not present

## 2016-12-10 DIAGNOSIS — Z13220 Encounter for screening for lipoid disorders: Secondary | ICD-10-CM | POA: Diagnosis not present

## 2016-12-10 DIAGNOSIS — R5383 Other fatigue: Secondary | ICD-10-CM | POA: Diagnosis not present

## 2016-12-10 LAB — POCT CBC
Granulocyte percent: 76.5 % (ref 37–80)
HCT, POC: 44.2 % (ref 43.5–53.7)
Hemoglobin: 16.1 g/dL (ref 14.1–18.1)
Lymph, poc: 1.9 (ref 0.6–3.4)
MCH, POC: 30.7 pg (ref 27–31.2)
MCHC: 36.5 g/dL — AB (ref 31.8–35.4)
MCV: 84.1 fL (ref 80–97)
MID (cbc): 0.8 (ref 0–0.9)
MPV: 8.3 fL (ref 0–99.8)
POC Granulocyte: 9 — AB (ref 2–6.9)
POC LYMPH PERCENT: 16.6 % (ref 10–50)
POC MID %: 6.9 %M (ref 0–12)
Platelet Count, POC: 326 10*3/uL (ref 142–424)
RBC: 5.25 M/uL (ref 4.69–6.13)
RDW, POC: 13.8 %
WBC: 11.7 10*3/uL — AB (ref 4.6–10.2)

## 2016-12-10 LAB — POCT URINALYSIS DIP (MANUAL ENTRY)
Bilirubin, UA: NEGATIVE
Glucose, UA: >=1000 mg/dL — AB
Leukocytes, UA: NEGATIVE
Nitrite, UA: NEGATIVE
Spec Grav, UA: <=1.005 — AB (ref 1.010–1.025)
Urobilinogen, UA: 0.2 U/dL
pH, UA: 5 (ref 5.0–8.0)

## 2016-12-10 LAB — POC MICROSCOPIC URINALYSIS (UMFC): Mucus: ABSENT

## 2016-12-10 LAB — POCT GLYCOSYLATED HEMOGLOBIN (HGB A1C): Hemoglobin A1C: 10.8

## 2016-12-10 MED ORDER — METFORMIN HCL 500 MG PO TABS: 500.0000 mg | ORAL_TABLET | Freq: Two times a day (BID) | ORAL | 3 refills | Status: DC

## 2016-12-10 NOTE — Patient Instructions (Addendum)
Your hemoglobin A1C is 10.8. This is very high and is considered diabetes. We need to get this level down. Please fill your metformin at your pharmacy and start taking it according to the schedule below:    Metformin Dosing (to be taken with food) Week 1: take 1/2 tablet twice a day. Week 2: take 1 tablet in the morning, 1/2 tablet at night. Week 3: take 2 tablets twice a day.  Please schedule a follow-up appointment with me in 3-4 weeks. At that time we will recheck your blood sugar, make sure you are feeling well on Metformin. We will discuss home monitoring of your blood sugar.  Please see the information below. It is very important to maintain a healthy diet and get regular exercise if you have diabetes. [email or web address] is a Microbiologist. If you need additional help, I can refer you to a nutritionist to help change your diet to lower your blood sugar.  Thank you for coming in today. I hope you feel we met your needs.  Feel free to call [facility] if you have any questions or further requests.  Please consider signing up for MyChart if you do not already have it, as this is a great way to communicate with me.  Best,  Whitney McVey, PA-C,th  Carbohydrate Counting for Diabetes Mellitus, Adult Carbohydrate counting is a method for keeping track of how many carbohydrates you eat. Eating carbohydrates naturally increases the amount of sugar (glucose) in the blood. Counting how many carbohydrates you eat helps keep your blood glucose within normal limits, which helps you manage your diabetes (diabetes mellitus). It is important to know how many carbohydrates you can safely have in each meal. This is different for every person. A diet and nutrition specialist (registered dietitian) can help you make a meal plan and calculate how many carbohydrates you should have at each meal and snack. Carbohydrates are found in the following foods:  Grains, such as breads and cereals.  Dried beans and soy  products.  Starchy vegetables, such as potatoes, peas, and corn.  Fruit and fruit juices.  Milk and yogurt.  Sweets and snack foods, such as cake, cookies, candy, chips, and soft drinks.  How do I count carbohydrates? There are two ways to count carbohydrates in food. You can use either of the methods or a combination of both. Reading "Nutrition Facts" on packaged food The "Nutrition Facts" list is included on the labels of almost all packaged foods and beverages in the [place] It includes:  The serving size.  Information about nutrients in each serving, including the grams (g) of carbohydrate per serving.  To use the "Nutrition Facts":  Decide how many servings you will have.  Multiply the number of servings by the number of carbohydrates per serving.  The resulting number is the total amount of carbohydrates that you will be having.  Learning standard serving sizes of other foods When you eat foods containing carbohydrates that are not packaged or do not include "Nutrition Facts" on the label, you need to measure the servings in order to count the amount of carbohydrates:  Measure the foods that you will eat with a food scale or measuring cup, if needed.  Decide how many standard-size servings you will eat.  Multiply the number of servings by 15. Most carbohydrate-rich foods have about 15 g of carbohydrates per serving. ? For example, if you eat 8 oz (170 g) of strawberries, you will have eaten 2 servings and 30 g of carbohydrates (  2 servings x 15 g = 30 g).  For foods that have more than one food mixed, such as soups and casseroles, you must count the carbohydrates in each food that is included.  The following list contains standard serving sizes of common carbohydrate-rich foods. Each of these servings has about 15 g of carbohydrates:   hamburger bun or  English muffin.   oz (15 mL) syrup.   oz (14 g) jelly.  1 slice of bread.  1 six-inch tortilla.  3 oz (85 g)  cooked rice or pasta.  4 oz (113 g) cooked dried beans.  4 oz (113 g) starchy vegetable, such as peas, corn, or potatoes.  4 oz (113 g) hot cereal.  4 oz (113 g) mashed potatoes or  of a large baked potato.  4 oz (113 g) canned or frozen fruit.  4 oz (120 mL) fruit juice.  4-6 crackers.  6 chicken nuggets.  6 oz (170 g) unsweetened dry cereal.  6 oz (170 g) plain fat-free yogurt or yogurt sweetened with artificial sweeteners.  8 oz (240 mL) milk.  8 oz (170 g) fresh fruit or one small piece of fruit.  24 oz (680 g) popped popcorn.  Example of carbohydrate counting Sample meal  3 oz (85 g) chicken breast.  6 oz (170 g) brown rice.  4 oz (113 g) corn.  8 oz (240 mL) milk.  8 oz (170 g) strawberries with sugar-free whipped topping. Carbohydrate calculation 1. Identify the foods that contain carbohydrates: ? Rice. ? Corn. ? Milk. ? Strawberries. 2. Calculate how many servings you have of each food: ? 2 servings rice. ? 1 serving corn. ? 1 serving milk. ? 1 serving strawberries. 3. Multiply each number of servings by 15 g: ? 2 servings rice x 15 g = 30 g. ? 1 serving corn x 15 g = 15 g. ? 1 serving milk x 15 g = 15 g. ? 1 serving strawberries x 15 g = 15 g. 4. Add together all of the amounts to find the total grams of carbohydrates eaten: ? 30 g + 15 g + 15 g + 15 g = 75 g of carbohydrates total. This information is not intended to replace advice given to you by your health care provider. Make sure you discuss any questions you have with your health care provider. Document Released: 06/02/2005 Document Revised: 12/21/2015 Document Reviewed: 11/14/2015 Elsevier Interactive Patient Education  2018 [employer].  IF you received an x-ray today, you will receive an invoice from [facility]. Please contact [facility] at [phone] with questions or concerns regarding your invoice.   IF you received labwork today, you will receive an invoice  from [facility]. Please contact [facility] at [phone] with questions or concerns regarding your invoice.   Our billing staff will not be able to assist you with questions regarding bills from these companies.  You will be contacted with the lab results as soon as they are available. The fastest way to get your results is to activate your My Chart account. Instructions are located on the last page of this paperwork. If you have not heard from [place] regarding the results in 2 weeks, please contact this office.

## 2016-12-10 NOTE — Progress Notes (Signed)
Marc Long  MRN: [id number] DOB: 03/03/1976  PCP: [name], MD  Subjective:  Pt is a 41 year old male who presents to clinic for increased urine frequency and fatigue 1 week. He is here today with his mother.  He wakes up 1-2 times an hour during the night to urinate. He is thirsty all the time. C/u dry tongue.  Denies n/t of extremities, polyphagia, abdominal pain, changes in vision, chest pain, palpitations, blood in urine.  Does not eat a healthy diet. Does not exercise.  FHx DM - maternal aunt, PGGM and MGGM  H/o mood disorder - Recently increased Lithium and added Abilify 2 weeks ago. [facility] to have [facility]ium level drawn. Next appt mid July 2018. He would like level drawn today and have it faxed to Dr. Sharlynn Oliphant     Review of Systems  Constitutional: Positive for fatigue. Negative for diaphoresis, fever and unexpected weight change.  Gastrointestinal: Negative for abdominal pain, nausea and vomiting.  Endocrine: Positive for polydipsia and polyuria. Negative for polyphagia.  Genitourinary: Positive for frequency.  Neurological: Negative for numbness.  Psychiatric/Behavioral: Positive for sleep disturbance.    Patient Active Problem List   Diagnosis Date Noted  . Moderate single current episode of major depressive disorder ([place]) 04/15/2016  . Pain in joint, shoulder region 04/14/2016  . Diverticulosis of large intestine without hemorrhage 11/07/2015  . Calculus of gallbladder without cholecystitis 11/07/2015    Current Outpatient Prescriptions on File Prior to Visit  Medication Sig Dispense Refill  . ARIPiprazole (ABILIFY PO) Take by mouth.    [place] Rocker alk-PHENObarbital (DONNATAL) 16.2 MG tablet Take 1 tablet by mouth every 8 (eight) hours as needed. 90 tablet 1  . HYDROcodone-acetaminophen (NORCO/VICODIN) 5-325 MG tablet Take 1 tablet by mouth every 6 (six) hours as needed for moderate pain. 20  tablet 0  . lamoTRIgine (LAMICTAL) 100 MG tablet Take 100 mg by mouth daily.    Marland Kitchen lithium 300 MG tablet Take 300 mg by mouth 3 (three) times daily.    Marland Kitchen LORazepam (ATIVAN) 0.5 MG tablet Take 0.5 mg by mouth every 8 (eight) hours.    . Multiple Vitamins-Minerals (MULTIVITAMIN WITH MINERALS) tablet Take 1 tablet by mouth daily.    Marland Kitchen omeprazole (PRILOSEC) 40 MG capsule Take 1 capsule (40 mg total) by mouth daily. 90 capsule 1  . Probiotic Product (PROBIOTIC PO) Take by mouth.    . traMADol (ULTRAM) 50 MG tablet Take 1 tablet (50 mg total) by mouth every 6 (six) hours as needed. 15 tablet 1  . UNABLE TO FIND Take 1,500 mg by mouth. Med Name: cinnamon     No current facility-administered medications on file prior to visit.     Allergies  Allergen Reactions  . Bee Venom Swelling  . Keflex [Cephalexin]   . Relafen [Nabumetone]     Blood in stool  . Shellfish Allergy Nausea Only     Objective:  BP (!) 151/108   Pulse (!) 102   Temp 98.3 F (36.8 C) (Oral)   Resp 17   Ht 6' (1.829 m)   Wt (!) 323 lb (146.5 kg)   SpO2 98%   BMI 43.81 kg/m   Physical Exam  Constitutional: He is oriented to person, place, and time and well-developed, well-nourished, and in no distress. No distress.  Neck: Normal range of motion. Neck supple.  Cardiovascular: Normal rate, regular rhythm and normal heart sounds.  Neurological: He is alert and oriented to person, place, and time. GCS score is 15.  Skin: Skin is warm and dry.  No ulcerations, skin changes or breakdown b/l feet.   Psychiatric: Mood, memory, affect and judgment normal.  Vitals reviewed.  Results for orders placed or performed in visit on 12/10/16  POCT CBC  Result Value Ref Range   WBC 11.7 (A) 4.6 - 10.2 K/uL   Lymph, poc 1.9 0.6 - 3.4   POC LYMPH PERCENT 16.6 10 - 50 %L   MID (cbc) 0.8 0 - 0.9   POC MID % 6.9 0 - 12 %M   POC Granulocyte 9.0 (A) 2 - 6.9   Granulocyte percent 76.5 37 - 80 %G   RBC 5.25 4.69 - 6.13 M/uL    Hemoglobin 16.1 14.1 - 18.1 g/dL   HCT, POC 01.7 24.1 - 53.7 %   MCV 84.1 80 - 97 fL   MCH, POC 30.7 27 - 31.2 pg   MCHC 36.5 (A) 31.8 - 35.4 g/dL   RDW, POC 95.4 %   Platelet Count, POC 326 142 - 424 K/uL   MPV 8.3 0 - 99.8 fL  POCT urinalysis dipstick  Result Value Ref Range   Color, UA yellow yellow   Clarity, UA clear clear   Glucose, UA >=1,000 (A) negative mg/dL   Bilirubin, UA negative negative   Ketones, POC UA large (80) (A) negative mg/dL   Spec Grav, UA <=2.481 (A) 1.010 - 1.025   Blood, UA moderate (A) negative   pH, UA 5.0 5.0 - 8.0   Protein Ur, POC trace (A) negative mg/dL   Urobilinogen, UA 0.2 0.2 or 1.0 E.U./dL   Nitrite, UA Negative Negative   Leukocytes, UA Negative Negative  POCT Microscopic Urinalysis ([facility])  Result Value Ref Range   WBC,UR,HPF,POC None None WBC/hpf   RBC,UR,HPF,POC Moderate (A) None RBC/hpf   Bacteria None None, Too numerous to count   Mucus Absent Absent   Epithelial Cells, UR Per Microscopy None None, Too numerous to count cells/hpf  POCT glycosylated hemoglobin (Hb A1C)  Result Value Ref Range   Hemoglobin A1C 10.8     Assessment and Plan :  1. Type 2 diabetes mellitus without complication, without long-term current use of insulin (HCC) 2. Elevated hemoglobin A1c 3. Increased urinary frequency - metFORMIN (GLUCOPHAGE) 500 MG tablet; Take 1 tablet (500 mg total) by mouth 2 (two) times daily with a meal. Wk1: 1/2 tab 2x/day; W 2: 1 tab in am, 1/2 tab pm; Wk3: 2 tabs 2x/day.  Dispense: 180 tablet; Refill: 3 - CMP14+EGFR - POCT urinalysis dipstick - POCT Microscopic Urinalysis ([facility]) - Urine Culture - POCT glycosylated hemoglobin (Hb A1C) - Elevated A1C level suggestive of DM. Discussed at length with pt need to control blood sugar levels. Will initiate Metformin today. Encouraged improved diet and starting regular exercise. He will consider referral to DM nutrition. RTC in 4-6 weeks for recheck. Will make medications adjustments  accordingly. Consider referral to DM nutritionist. Will educate pt on home sugar checks at that time. He understands and agrees with plan.   4. Fatigue, unspecified type - POCT CBC - No sign of anemia.   5. Lithium use - Lithium level - Will check lithium level for pt and have it faxed to Dr. Quintella Reichert.   6. Elevated blood pressure reading - Recheck vitals  7. Screening, lipid - Lipid panel - Labs pending.   Marco Collie, PA-C  Primary [facility] at [facility] 12/10/2016 4:17 PM

## 2016-12-11 ENCOUNTER — Telehealth: Payer: Self-pay | Admitting: Physician Assistant

## 2016-12-11 ENCOUNTER — Emergency Department (HOSPITAL_COMMUNITY)
Admission: EM | Admit: 2016-12-11 | Discharge: 2016-12-11 | Disposition: A | Discharge status: Home / Self Care | Payer: Managed Care, Other (non HMO) | Attending: Emergency Medicine | Admitting: Emergency Medicine

## 2016-12-11 ENCOUNTER — Encounter (HOSPITAL_COMMUNITY): Payer: Self-pay | Admitting: Emergency Medicine

## 2016-12-11 DIAGNOSIS — Z7984 Long term (current) use of oral hypoglycemic drugs: Secondary | ICD-10-CM | POA: Diagnosis not present

## 2016-12-11 DIAGNOSIS — Z79899 Other long term (current) drug therapy: Secondary | ICD-10-CM | POA: Insufficient documentation

## 2016-12-11 DIAGNOSIS — R739 Hyperglycemia, unspecified: Secondary | ICD-10-CM

## 2016-12-11 DIAGNOSIS — E1165 Type 2 diabetes mellitus with hyperglycemia: Secondary | ICD-10-CM | POA: Insufficient documentation

## 2016-12-11 HISTORY — DX: Type 2 diabetes mellitus without complications: E11.9

## 2016-12-11 LAB — CBG MONITORING, ED
GLUCOSE-CAPILLARY: 396 mg/dL — AB (ref 65–99)
Glucose-Capillary: 376 mg/dL — ABNORMAL HIGH (ref 65–99)

## 2016-12-11 LAB — BLOOD GAS, VENOUS
Acid-base deficit: 4.8 mmol/L — ABNORMAL HIGH (ref 0.0–2.0)
Bicarbonate: 20.2 mmol/L (ref 20.0–28.0)
FIO2: 21
O2 Saturation: 63.8 %
PCO2 VEN: 39.3 mmHg — AB (ref 44.0–60.0)
PH VEN: 7.332 (ref 7.250–7.430)
Patient temperature: 98.6
pO2, Ven: 36.6 mmHg (ref 32.0–45.0)

## 2016-12-11 LAB — I-STAT CG4 LACTIC ACID, ED
LACTIC ACID, VENOUS: 2.23 mmol/L — AB (ref 0.5–1.9)
Lactic Acid, Venous: 1.98 mmol/L (ref 0.5–1.9)

## 2016-12-11 LAB — CMP14+EGFR
ALT: 44 IU/L (ref 0–44)
AST: 32 IU/L (ref 0–40)
Albumin/Globulin Ratio: 1.6 (ref 1.2–2.2)
Albumin: 5 g/dL (ref 3.5–5.5)
Alkaline Phosphatase: 141 IU/L — ABNORMAL HIGH (ref 39–117)
BUN/Creatinine Ratio: 16 (ref 9–20)
BUN: 29 mg/dL — ABNORMAL HIGH (ref 6–20)
Bilirubin Total: 0.3 mg/dL (ref 0.0–1.2)
CO2: 18 mmol/L — ABNORMAL LOW (ref 20–29)
Calcium: 10.7 mg/dL — ABNORMAL HIGH (ref 8.7–10.2)
Chloride: 85 mmol/L — ABNORMAL LOW (ref 96–106)
Creatinine, Ser: 1.82 mg/dL — ABNORMAL HIGH (ref 0.76–1.27)
GFR calc Af Amer: 53 mL/min/{1.73_m2} — ABNORMAL LOW (ref >59)
GFR calc non Af Amer: 46 mL/min/{1.73_m2} — ABNORMAL LOW (ref >59)
Globulin, Total: 3.2 g/dL (ref 1.5–4.5)
Glucose: 743 mg/dL (ref 65–99)
Potassium: 5.6 mmol/L — ABNORMAL HIGH (ref 3.5–5.2)
Sodium: 128 mmol/L — ABNORMAL LOW (ref 134–144)
Total Protein: 8.2 g/dL (ref 6.0–8.5)

## 2016-12-11 LAB — URINALYSIS, ROUTINE W REFLEX MICROSCOPIC
BACTERIA UA: NONE SEEN
Bilirubin Urine: NEGATIVE
Glucose, UA: >=500 mg/dL — AB
Ketones, ur: 80 mg/dL — AB
Leukocytes, UA: NEGATIVE
Nitrite: NEGATIVE
PROTEIN: 100 mg/dL — AB
Specific Gravity, Urine: 1.028 (ref 1.005–1.030)
pH: 5 (ref 5.0–8.0)

## 2016-12-11 LAB — BASIC METABOLIC PANEL
ANION GAP: 17 — AB (ref 5–15)
ANION GAP: 15 (ref 5–15)
BUN: 26 mg/dL — ABNORMAL HIGH (ref 6–20)
BUN: 22 mg/dL — ABNORMAL HIGH (ref 6–20)
CALCIUM: 10 mg/dL (ref 8.9–10.3)
CO2: 20 mmol/L — AB (ref 22–32)
CO2: 16 mmol/L — AB (ref 22–32)
Calcium: 9 mg/dL (ref 8.9–10.3)
Chloride: 94 mmol/L — ABNORMAL LOW (ref 101–111)
Chloride: 100 mmol/L — ABNORMAL LOW (ref 101–111)
Creatinine, Ser: 1.78 mg/dL — ABNORMAL HIGH (ref 0.61–1.24)
Creatinine, Ser: 1.45 mg/dL — ABNORMAL HIGH (ref 0.61–1.24)
GFR calc Af Amer: 54 mL/min — ABNORMAL LOW (ref >60)
GFR calc Af Amer: >60 mL/min (ref >60)
GFR calc non Af Amer: 46 mL/min — ABNORMAL LOW (ref >60)
GFR calc non Af Amer: 59 mL/min — ABNORMAL LOW (ref >60)
GLUCOSE: 442 mg/dL — AB (ref 65–99)
GLUCOSE: 369 mg/dL — AB (ref 65–99)
POTASSIUM: 4.3 mmol/L (ref 3.5–5.1)
Potassium: 4.9 mmol/L (ref 3.5–5.1)
Sodium: 131 mmol/L — ABNORMAL LOW (ref 135–145)
Sodium: 131 mmol/L — ABNORMAL LOW (ref 135–145)

## 2016-12-11 LAB — LIPID PANEL
Chol/HDL Ratio: 15.5 ratio — ABNORMAL HIGH (ref 0.0–5.0)
Cholesterol, Total: 309 mg/dL — ABNORMAL HIGH (ref 100–199)
HDL: 20 mg/dL — ABNORMAL LOW (ref >39)
Triglycerides: 2277 mg/dL (ref 0–149)

## 2016-12-11 LAB — HEPATIC FUNCTION PANEL
ALK PHOS: 91 U/L (ref 38–126)
ALT: 45 U/L (ref 17–63)
AST: 40 U/L (ref 15–41)
Albumin: 4.6 g/dL (ref 3.5–5.0)
BILIRUBIN DIRECT: 0.2 mg/dL (ref 0.1–0.5)
BILIRUBIN INDIRECT: 1 mg/dL — AB (ref 0.3–0.9)
Total Bilirubin: 1.2 mg/dL (ref 0.3–1.2)
Total Protein: 8.3 g/dL — ABNORMAL HIGH (ref 6.5–8.1)

## 2016-12-11 LAB — CBC
HEMATOCRIT: 46 % (ref 39.0–52.0)
HEMOGLOBIN: 16.6 g/dL (ref 13.0–17.0)
MCH: 29.7 pg (ref 26.0–34.0)
MCHC: 36.1 g/dL — AB (ref 30.0–36.0)
MCV: 82.4 fL (ref 78.0–100.0)
Platelets: 330 10*3/uL (ref 150–400)
RBC: 5.58 MIL/uL (ref 4.22–5.81)
RDW: 13 % (ref 11.5–15.5)
WBC: 12.2 10*3/uL — ABNORMAL HIGH (ref 4.0–10.5)

## 2016-12-11 LAB — LITHIUM LEVEL: Lithium Lvl: 0.5 mmol/L — ABNORMAL LOW (ref 0.6–1.2)

## 2016-12-11 LAB — I-STAT TROPONIN, ED: Troponin i, poc: 0.02 ng/mL (ref 0.00–0.08)

## 2016-12-11 MED ORDER — RANITIDINE HCL 150 MG/10ML PO SYRP
300.0000 mg | ORAL_SOLUTION | Freq: Once | ORAL | Status: AC
  Administered 2016-12-11: 300 mg via ORAL
  Filled 2016-12-11: qty 20

## 2016-12-11 MED ORDER — LACTATED RINGERS IV BOLUS (SEPSIS)
1000.0000 mL | Freq: Once | INTRAVENOUS | Status: AC
  Administered 2016-12-11: 1000 mL via INTRAVENOUS

## 2016-12-11 MED ORDER — SODIUM CHLORIDE 0.9 % IV BOLUS (SEPSIS)
1000.0000 mL | Freq: Once | INTRAVENOUS | Status: AC
  Administered 2016-12-11: 1000 mL via INTRAVENOUS

## 2016-12-11 NOTE — Telephone Encounter (Signed)
Advised mother that Demarlo need to go straight to the ED given elevated sugar and AKI.  Mother will coordinate transport for him. Philis Fendt, MS, PA-C 2:11 PM, 12/11/2016

## 2016-12-11 NOTE — ED Notes (Signed)
Pt in no acute distress, family at bedside

## 2016-12-11 NOTE — ED Notes (Addendum)
Pt given mouth wash and swabs per request. Pt has approx half of NS infusion left.

## 2016-12-11 NOTE — ED Notes (Signed)
Pt has a fourth of NS bolus to complete.

## 2016-12-11 NOTE — Discharge Instructions (Signed)
Please call [facility] tomorrow to schedule a follow-up visit and let them know that you were seen in the emergency department tonight. Please ask for a recommendation about which medication to start for diabetes in place of metformin. I have attached information along with her discharge paperwork on nutrition for diabetic patients. Please continue to drink plenty of water. If you develop new or worsening symptoms, including urinary frequency, numbness or tingling, or dry mouth, please return to the emergency department for reevaluation.

## 2016-12-11 NOTE — ED Notes (Signed)
Pt not in room at present time.

## 2016-12-11 NOTE — ED Notes (Signed)
Pt in room with lights dimmed, resting, family at bedside. NS continues to infuse. Pt denies pain.

## 2016-12-11 NOTE — ED Notes (Signed)
Lactic given to RN and MD.   

## 2016-12-11 NOTE — ED Triage Notes (Signed)
Pt was recently diagnosed as diabetic and placed on metformin.  Pt reports his PCP called him and told him to come in because his sugar was >700 and his electrolyte panel was abnormal.  Pt denies pain at this time.  Endorses increase in thirst and urinary frequency.  Pt ambulatory and A&O x4 during triage.

## 2016-12-11 NOTE — ED Provider Notes (Signed)
[facility] DEPT Provider Note   CSN: [id number] Arrival date & time: 12/11/16  1437     History   Chief Complaint Chief Complaint  Patient presents with  . Hyperglycemia  . Abnormal Labs    HPI Marc Long is a 42 y.o. male who presents to the Emergency Department Who presents to the emergency department after receiving a call earlier today from his primary care provider because his blood sugar was 743 when it was checked at the office yesterday. He reports 1 week of polyuria, polydipsia, xerostomia, intermittent dry heaves, intermittent right flank pain, and blurred vision. He reports that he has been voiding every 30-45 minutes for the last week and has been feeling weak and tired. He states that he was diagnosed with diabetes type 2 and started on metformin yesterday. He denies dysuria, chest pain, dyspnea, abdominal pain   PMH includes bipolar disorder. Current medications include Lithium, Abilify, Lamictal, and Ativan PRN. He reports that in May his Lithium was changed from 900 mg to 600 mg, but was recently changed back to 900 mg about 2-3 weeks ago. He reports the Abilify was also d/ced in May, but was added back to the patient's medication regimen in May when his Lithium was increased. No other recent medication changes. His Lithium level was checked on 6/27 and was slightly decreased at 0.5.   The history is provided by the patient and a relative. No language interpreter was used.    Past Medical History:  Diagnosis Date  . Depression   . Diabetes mellitus without complication ([place])   . Gallstones   . Obesity, Class III, BMI 40-49.9 (morbid obesity) ([place])     Patient Active Problem List   Diagnosis Date Noted  . Type 2 diabetes mellitus without complication, without long-term current use of insulin ([place]) 12/10/2016  . Lithium use 12/10/2016  . Moderate single current episode of major depressive disorder ([place]) 04/15/2016  . Pain in joint, shoulder region 04/14/2016    . Diverticulosis of large intestine without hemorrhage 11/07/2015  . Calculus of gallbladder without cholecystitis 11/07/2015    History reviewed. No pertinent surgical history.     Home Medications    Prior to Admission medications   Medication Sig Start Date End Date Taking? Authorizing Provider  ARIPiprazole (ABILIFY) 5 MG tablet TK 1 T PO QD 11/25/16  Yes [provider]  lamoTRIgine (LAMICTAL) 200 MG tablet TK 1 T PO QD 11/25/16  Yes [provider]  lithium carbonate (LITHOBID) 300 MG CR tablet Take 3 tablets by mouth daily 11/25/16  Yes [provider]  LORazepam (ATIVAN) 0.5 MG tablet Take 0.5 mg by mouth every 8 (eight) hours as needed for anxiety.    Yes [provider]  metFORMIN (GLUCOPHAGE) 500 MG tablet Take 1 tablet (500 mg total) by mouth 2 (two) times daily with a meal. Wk1: 1/2 tab 2x/day; W 2: 1 tab in am, 1/2 tab pm; Wk3: 2 tabs 2x/day. Patient taking differently: Take 250-500 mg by mouth 2 (two) times daily with a meal. Wk1: 1/2 tab 2x/day; W 2: 1 tab in am, 1/2 tab pm; Wk3: 2 tabs 2x/day. 12/10/16  Yes McVey, Gelene Mink, PA-C  Multiple Vitamins-Minerals (MULTIVITAMIN WITH MINERALS) tablet Take 1 tablet by mouth daily.   Yes [provider]    Family History Family History  Problem Relation Age of Onset  . Hypertension Mother   . Depression Mother   . Heart attack Maternal Grandfather   . Lymphoma Maternal  Grandmother   . Diabetes Maternal Aunt   . Cancer Maternal Aunt     Social History Social History  Substance Use Topics  . Smoking status: Never Smoker  . Smokeless tobacco: Never Used  . Alcohol use 0.0 oz/week     Comment: once every 2 weeks. Wine coolers     Allergies   Bee venom; Keflex [cephalexin]; Relafen [nabumetone]; and Shellfish allergy   Review of Systems Review of Systems  Constitutional: Negative for activity change.  HENT:       Xerostomia.   Eyes: Positive for visual  disturbance (blurred).  Respiratory: Negative for shortness of breath.   Cardiovascular: Negative for chest pain.  Gastrointestinal: Positive for abdominal pain. Negative for diarrhea, nausea and vomiting.  Endocrine: Positive for polydipsia and polyuria.  Genitourinary: Positive for flank pain. Negative for dysuria and hematuria.  Musculoskeletal: Negative for back pain.  Skin: Negative for rash.  Allergic/Immunologic: Positive for immunocompromised state.  Neurological: Negative for weakness, light-headedness, numbness and headaches.   Physical Exam Updated Vital Signs BP (!) 154/92   Pulse 84   Temp 97.5 F (36.4 C) (Oral)   Resp (!) 24   Ht 6' (1.829 m)   Wt (!) 146.5 kg (323 lb)   SpO2 94%   BMI 43.81 kg/m   Physical Exam  Constitutional: He appears well-developed.  HENT:  Head: Normocephalic.  Eyes: Conjunctivae are normal.  Neck: Neck supple.  Cardiovascular: Regular rhythm, normal heart sounds and intact distal pulses.  Tachycardia present.  Exam reveals no friction rub.   No murmur heard. Pulmonary/Chest: Effort normal and breath sounds normal. No respiratory distress. He has no wheezes. He has no rales. He exhibits no tenderness.  Abdominal: Soft. Bowel sounds are normal. He exhibits no distension and no mass. There is no tenderness. There is no rebound and no guarding.  Abdomen and bilateral flanks are nontender to palpation.  Musculoskeletal: Normal range of motion. He exhibits no edema, tenderness or deformity.  Neurological: He is alert.  Cranial nerves 2-12 intact. 5/5 motor strength of the bilateral upper and lower extremities. Moves all four extremities. Ambulatory without difficulty. NVI.   Skin: Skin is warm and dry. Capillary refill takes less than 2 seconds.  Psychiatric: His behavior is normal.  Nursing note and vitals reviewed.  ED Treatments / Results  Labs (all labs ordered are listed, but only abnormal results are displayed) Labs Reviewed  BASIC  METABOLIC PANEL - Abnormal; Notable for the following:       Result Value   Sodium 131 (*)    Chloride 94 (*)    CO2 20 (*)    Glucose, Bld 442 (*)    BUN 26 (*)    Creatinine, Ser 1.78 (*)    GFR calc non Af Amer 46 (*)    GFR calc Af Amer 54 (*)    Anion gap 17 (*)    All other components within normal limits  CBC - Abnormal; Notable for the following:    WBC 12.2 (*)    MCHC 36.1 (*)    All other components within normal limits  URINALYSIS, ROUTINE W REFLEX MICROSCOPIC - Abnormal; Notable for the following:    Glucose, UA >=500 (*)    Hgb urine dipstick MODERATE (*)    Ketones, ur 80 (*)    Protein, ur 100 (*)    Squamous Epithelial / LPF 0-5 (*)    All other components within normal limits  HEPATIC FUNCTION PANEL - Abnormal; Notable  for the following:    Total Protein 8.3 (*)    Indirect Bilirubin 1.0 (*)    All other components within normal limits  BASIC METABOLIC PANEL - Abnormal; Notable for the following:    Sodium 131 (*)    Chloride 100 (*)    CO2 16 (*)    Glucose, Bld 369 (*)    BUN 22 (*)    Creatinine, Ser 1.45 (*)    GFR calc non Af Amer 59 (*)    All other components within normal limits  BLOOD GAS, VENOUS - Abnormal; Notable for the following:    pCO2, Ven 39.3 (*)    Acid-base deficit 4.8 (*)    All other components within normal limits  CBG MONITORING, ED - Abnormal; Notable for the following:    Glucose-Capillary 396 (*)    All other components within normal limits  I-STAT CG4 LACTIC ACID, ED - Abnormal; Notable for the following:    Lactic Acid, Venous 2.23 (*)    All other components within normal limits  I-STAT CG4 LACTIC ACID, ED - Abnormal; Notable for the following:    Lactic Acid, Venous 1.98 (*)    All other components within normal limits  CBG MONITORING, ED - Abnormal; Notable for the following:    Glucose-Capillary 376 (*)    All other components within normal limits  I-STAT TROPOININ, ED  I-STAT TROPOININ, ED    EKG  EKG  Interpretation  Date/Time:  Thursday December 11 2016 18:07:38 EDT Ventricular Rate:  91 PR Interval:    QRS Duration: 85 QT Interval:  278 QTC Calculation: 342 R Axis:   4 Text Interpretation:  Sinus rhythm Abnormal R-wave progression, early transition Confirmed by Hazle Coca [phone]) on 12/11/2016 6:21:22 PM       Radiology No results found.  Procedures Procedures (including critical care time)  Medications Ordered in ED Medications  sodium chloride 0.9 % bolus 1,000 mL (0 mLs Intravenous Stopped 12/11/16 1938)  lactated ringers bolus 1,000 mL (0 mLs Intravenous Stopped 12/11/16 2122)  ranitidine (ZANTAC) 150 MG/10ML syrup 300 mg (300 mg Oral Given 12/11/16 1905)     Initial Impression / Assessment and Plan / ED Course  I have reviewed the triage vital signs and the nursing notes.  Pertinent labs & imaging results that were available during my care of the patient were reviewed by me and considered in my medical decision making (see chart for details).  18:41- Re-evaluated the patient with Dr. Ralene Bathe, attending physician. He reports that he has been taking prilosec previously for GERD, but has not taken the medication recently and is c/o of acid reflux. Will order ranitidine.      Patient presenting to the emergency department with hyperglycemia. Patient is well appearing at this time with no evidence of DKA or HHS. Patient with CBG of 743 at PCPs office yesterday, improving to 442 today prior to fluid resuscitation and 369 after IV fluids. Baseline Cr 1.2; creatinine improving from 1.82 to 1.45. Patient was hyperkalemic at 5.6, improving to 4.9 today. Discussed and evaluated the patient with Dr. Ralene Bathe, attending physician. Lactate originally 2.23, improving to 1.98. After fluid administration, the patient's well-appearing with no complaints at this time. Discussed considering metformin and follow up with his primary care provider to start a new medication for his diabetes. At this time the  patient feels that he is stable for discharge and would like to go home to continue fluid resuscitation. The patient seems reliable and is  motivated to make changes to his diet. The patient was provided information regarding suggested diets for diabetic patients. Vital signs and continued to improve. Strict return precautions given. Will discharge the patient home at this time.   Final Clinical Impressions(s) / ED Diagnoses   Final diagnoses:  Hyperglycemia    New Prescriptions Discharge Medication List as of 12/11/2016 11:12 PM       Joanne Gavel, PA-C 12/12/16 0256    Quintella Reichert, MD 12/12/16 1452

## 2016-12-11 NOTE — ED Notes (Signed)
Pt complaint of dry mouth, urinary frequency, and dry heaving for past week.

## 2016-12-12 ENCOUNTER — Telehealth: Payer: Self-pay

## 2016-12-12 ENCOUNTER — Ambulatory Visit (INDEPENDENT_AMBULATORY_CARE_PROVIDER_SITE_OTHER): Payer: Managed Care, Other (non HMO) | Admitting: Family Medicine

## 2016-12-12 ENCOUNTER — Encounter: Payer: Self-pay | Admitting: Family Medicine

## 2016-12-12 ENCOUNTER — Observation Stay (HOSPITAL_COMMUNITY)
Admission: AD | Admit: 2016-12-12 | Discharge: 2016-12-15 | Disposition: A | Discharge status: Home / Self Care | Payer: Managed Care, Other (non HMO) | Source: Ambulatory Visit | Attending: Internal Medicine | Admitting: Internal Medicine

## 2016-12-12 ENCOUNTER — Encounter (HOSPITAL_COMMUNITY): Payer: Self-pay | Admitting: Internal Medicine

## 2016-12-12 VITALS — BP 135/88 | HR 97 | Temp 98.7°F | Resp 16 | Ht 71.0 in | Wt 322.2 lb

## 2016-12-12 DIAGNOSIS — Z6841 Body Mass Index (BMI) 40.0 and over, adult: Secondary | ICD-10-CM | POA: Diagnosis not present

## 2016-12-12 DIAGNOSIS — F3130 Bipolar disorder, current episode depressed, mild or moderate severity, unspecified: Secondary | ICD-10-CM

## 2016-12-12 DIAGNOSIS — E1110 Type 2 diabetes mellitus with ketoacidosis without coma: Secondary | ICD-10-CM | POA: Diagnosis present

## 2016-12-12 DIAGNOSIS — E119 Type 2 diabetes mellitus without complications: Secondary | ICD-10-CM

## 2016-12-12 DIAGNOSIS — N179 Acute kidney failure, unspecified: Secondary | ICD-10-CM | POA: Insufficient documentation

## 2016-12-12 DIAGNOSIS — E860 Dehydration: Secondary | ICD-10-CM | POA: Diagnosis not present

## 2016-12-12 DIAGNOSIS — IMO0002 Reserved for concepts with insufficient information to code with codable children: Secondary | ICD-10-CM

## 2016-12-12 DIAGNOSIS — Z91030 Bee allergy status: Secondary | ICD-10-CM | POA: Diagnosis not present

## 2016-12-12 DIAGNOSIS — E1165 Type 2 diabetes mellitus with hyperglycemia: Secondary | ICD-10-CM | POA: Diagnosis not present

## 2016-12-12 DIAGNOSIS — K625 Hemorrhage of anus and rectum: Secondary | ICD-10-CM | POA: Insufficient documentation

## 2016-12-12 DIAGNOSIS — F319 Bipolar disorder, unspecified: Secondary | ICD-10-CM | POA: Insufficient documentation

## 2016-12-12 DIAGNOSIS — R45851 Suicidal ideations: Secondary | ICD-10-CM | POA: Diagnosis not present

## 2016-12-12 DIAGNOSIS — Z91013 Allergy to seafood: Secondary | ICD-10-CM | POA: Diagnosis not present

## 2016-12-12 DIAGNOSIS — Z888 Allergy status to other drugs, medicaments and biological substances status: Secondary | ICD-10-CM | POA: Insufficient documentation

## 2016-12-12 DIAGNOSIS — E876 Hypokalemia: Secondary | ICD-10-CM | POA: Diagnosis not present

## 2016-12-12 DIAGNOSIS — Z794 Long term (current) use of insulin: Secondary | ICD-10-CM | POA: Insufficient documentation

## 2016-12-12 DIAGNOSIS — Z881 Allergy status to other antibiotic agents status: Secondary | ICD-10-CM | POA: Insufficient documentation

## 2016-12-12 DIAGNOSIS — E871 Hypo-osmolality and hyponatremia: Secondary | ICD-10-CM | POA: Diagnosis not present

## 2016-12-12 DIAGNOSIS — E1122 Type 2 diabetes mellitus with diabetic chronic kidney disease: Secondary | ICD-10-CM | POA: Diagnosis not present

## 2016-12-12 DIAGNOSIS — Z79899 Other long term (current) drug therapy: Secondary | ICD-10-CM | POA: Insufficient documentation

## 2016-12-12 HISTORY — DX: Reserved for concepts with insufficient information to code with codable children: IMO0002

## 2016-12-12 HISTORY — DX: Type 2 diabetes mellitus with hyperglycemia: E11.65

## 2016-12-12 LAB — GLUCOSE, CAPILLARY
GLUCOSE-CAPILLARY: 363 mg/dL — AB (ref 65–99)
GLUCOSE-CAPILLARY: 258 mg/dL — AB (ref 65–99)
GLUCOSE-CAPILLARY: 278 mg/dL — AB (ref 65–99)
GLUCOSE-CAPILLARY: 256 mg/dL — AB (ref 65–99)
GLUCOSE-CAPILLARY: 247 mg/dL — AB (ref 65–99)
GLUCOSE-CAPILLARY: 236 mg/dL — AB (ref 65–99)
Glucose-Capillary: 213 mg/dL — ABNORMAL HIGH (ref 65–99)
Glucose-Capillary: 192 mg/dL — ABNORMAL HIGH (ref 65–99)

## 2016-12-12 LAB — COMPREHENSIVE METABOLIC PANEL
ALBUMIN: 4.1 g/dL (ref 3.5–5.0)
ALK PHOS: 72 U/L (ref 38–126)
ALT: 46 U/L (ref 17–63)
ANION GAP: 17 — AB (ref 5–15)
AST: 39 U/L (ref 15–41)
BILIRUBIN TOTAL: 0.9 mg/dL (ref 0.3–1.2)
BUN: 21 mg/dL — AB (ref 6–20)
CALCIUM: 9.1 mg/dL (ref 8.9–10.3)
CO2: 15 mmol/L — AB (ref 22–32)
Chloride: 98 mmol/L — ABNORMAL LOW (ref 101–111)
Creatinine, Ser: 1.57 mg/dL — ABNORMAL HIGH (ref 0.61–1.24)
GFR calc Af Amer: >60 mL/min (ref >60)
GFR calc non Af Amer: 54 mL/min — ABNORMAL LOW (ref >60)
GLUCOSE: 396 mg/dL — AB (ref 65–99)
Potassium: 4.6 mmol/L (ref 3.5–5.1)
SODIUM: 130 mmol/L — AB (ref 135–145)
TOTAL PROTEIN: 7.4 g/dL (ref 6.5–8.1)

## 2016-12-12 LAB — BASIC METABOLIC PANEL
ANION GAP: 11 (ref 5–15)
BUN / CREAT RATIO: 13 (ref 9–20)
BUN: 21 mg/dL — AB (ref 6–20)
BUN: 18 mg/dL (ref 6–20)
CALCIUM: 9.4 mg/dL (ref 8.7–10.2)
CALCIUM: 8.6 mg/dL — AB (ref 8.9–10.3)
CHLORIDE: 94 mmol/L — AB (ref 96–106)
CO2: 15 mmol/L — AB (ref 20–29)
CO2: 19 mmol/L — AB (ref 22–32)
CREATININE: 1.6 mg/dL — AB (ref 0.76–1.27)
Chloride: 105 mmol/L (ref 101–111)
Creatinine, Ser: 1.39 mg/dL — ABNORMAL HIGH (ref 0.61–1.24)
GFR calc Af Amer: 62 mL/min/{1.73_m2} (ref >59)
GFR calc non Af Amer: 53 mL/min/{1.73_m2} — ABNORMAL LOW (ref >59)
GLUCOSE: 437 mg/dL — AB (ref 65–99)
Glucose, Bld: 255 mg/dL — ABNORMAL HIGH (ref 65–99)
Potassium: 5 mmol/L (ref 3.5–5.2)
Potassium: 4.1 mmol/L (ref 3.5–5.1)
SODIUM: 135 mmol/L (ref 135–145)
Sodium: 130 mmol/L — ABNORMAL LOW (ref 134–144)

## 2016-12-12 LAB — CBC
HEMATOCRIT: 41.7 % (ref 39.0–52.0)
HEMOGLOBIN: 15 g/dL (ref 13.0–17.0)
MCH: 30 pg (ref 26.0–34.0)
MCHC: 36 g/dL (ref 30.0–36.0)
MCV: 83.4 fL (ref 78.0–100.0)
Platelets: 284 10*3/uL (ref 150–400)
RBC: 5 MIL/uL (ref 4.22–5.81)
RDW: 13.4 % (ref 11.5–15.5)
WBC: 9.8 10*3/uL (ref 4.0–10.5)

## 2016-12-12 LAB — GLUCOSE, POCT (MANUAL RESULT ENTRY): POC Glucose: 404 mg/dl — AB (ref 70–99)

## 2016-12-12 LAB — POCT URINALYSIS DIP (MANUAL ENTRY)
Bilirubin, UA: NEGATIVE
Glucose, UA: 500 mg/dL — AB
LEUKOCYTES UA: NEGATIVE
Nitrite, UA: NEGATIVE
SPEC GRAV UA: 1.015 (ref 1.010–1.025)
UROBILINOGEN UA: 0.2 U/dL
pH, UA: 5.5 (ref 5.0–8.0)

## 2016-12-12 LAB — LACTIC ACID, PLASMA
LACTATE: 12.5 mg/dL (ref 4.8–25.7)
Lactic Acid, Venous: 1.7 mmol/L (ref 0.5–1.9)

## 2016-12-12 LAB — MRSA PCR SCREENING: MRSA BY PCR: NEGATIVE

## 2016-12-12 MED ORDER — DEXTROSE-NACL 5-0.45 % IV SOLN
INTRAVENOUS | Status: DC
  Administered 2016-12-12 – 2016-12-13 (×2): via INTRAVENOUS

## 2016-12-12 MED ORDER — ENOXAPARIN SODIUM 60 MG/0.6ML ~~LOC~~ SOLN: 60.0000 mg | SUBCUTANEOUS | Status: DC

## 2016-12-12 MED ORDER — ADULT MULTIVITAMIN W/MINERALS CH
1.0000 | ORAL_TABLET | Freq: Every day | ORAL | Status: DC
  Administered 2016-12-12 – 2016-12-15 (×4): 1 via ORAL
  Filled 2016-12-12 (×5): qty 1

## 2016-12-12 MED ORDER — LITHIUM CARBONATE ER 450 MG PO TBCR
900.0000 mg | EXTENDED_RELEASE_TABLET | Freq: Every day | ORAL | Status: DC
  Administered 2016-12-12 – 2016-12-15 (×4): 900 mg via ORAL
  Filled 2016-12-12 (×5): qty 2

## 2016-12-12 MED ORDER — SODIUM CHLORIDE 0.9 % IV BOLUS (SEPSIS): 1000.0000 mL | Freq: Once | INTRAVENOUS | Status: DC

## 2016-12-12 MED ORDER — LAMOTRIGINE 100 MG PO TABS
200.0000 mg | ORAL_TABLET | Freq: Every day | ORAL | Status: DC
  Administered 2016-12-12 – 2016-12-15 (×4): 200 mg via ORAL
  Filled 2016-12-12 (×4): qty 2

## 2016-12-12 MED ORDER — ENOXAPARIN SODIUM 60 MG/0.6ML ~~LOC~~ SOLN
60.0000 mg | SUBCUTANEOUS | Status: DC
  Administered 2016-12-12 – 2016-12-13 (×2): 60 mg via SUBCUTANEOUS
  Filled 2016-12-12 (×4): qty 0.6

## 2016-12-12 MED ORDER — SODIUM CHLORIDE 0.9 % IV SOLN
INTRAVENOUS | Status: DC
  Administered 2016-12-12: 17:00:00 via INTRAVENOUS

## 2016-12-12 MED ORDER — LORAZEPAM 0.5 MG PO TABS: 0.5000 mg | ORAL_TABLET | Freq: Three times a day (TID) | ORAL | Status: DC | PRN

## 2016-12-12 MED ORDER — POLYETHYLENE GLYCOL 3350 17 G PO PACK
17.0000 g | PACK | Freq: Every day | ORAL | Status: DC
  Administered 2016-12-14 – 2016-12-15 (×2): 17 g via ORAL
  Filled 2016-12-12 (×3): qty 1

## 2016-12-12 MED ORDER — ALBUTEROL SULFATE (2.5 MG/3ML) 0.083% IN NEBU: 2.5000 mg | INHALATION_SOLUTION | RESPIRATORY_TRACT | Status: DC | PRN

## 2016-12-12 MED ORDER — ACETAMINOPHEN 650 MG RE SUPP: 650.0000 mg | Freq: Four times a day (QID) | RECTAL | Status: DC | PRN

## 2016-12-12 MED ORDER — SODIUM CHLORIDE 0.9 % IV SOLN: INTRAVENOUS | Status: DC

## 2016-12-12 MED ORDER — ACETAMINOPHEN 325 MG PO TABS: 650.0000 mg | ORAL_TABLET | Freq: Four times a day (QID) | ORAL | Status: DC | PRN

## 2016-12-12 MED ORDER — SODIUM CHLORIDE 0.9 % IV SOLN
INTRAVENOUS | Status: DC
  Administered 2016-12-12: 3 [IU]/h via INTRAVENOUS
  Filled 2016-12-12: qty 1

## 2016-12-12 MED ORDER — ARIPIPRAZOLE 5 MG PO TABS
5.0000 mg | ORAL_TABLET | Freq: Every day | ORAL | Status: DC
  Administered 2016-12-12 – 2016-12-15 (×4): 5 mg via ORAL
  Filled 2016-12-12 (×5): qty 1

## 2016-12-12 MED ORDER — LIVING WELL WITH DIABETES BOOK
Freq: Once | Status: AC
  Administered 2016-12-12: 23:00:00
  Filled 2016-12-12: qty 1

## 2016-12-12 MED ORDER — POTASSIUM CHLORIDE 10 MEQ/100ML IV SOLN
10.0000 meq | INTRAVENOUS | Status: AC
  Administered 2016-12-12 (×2): 10 meq via INTRAVENOUS
  Filled 2016-12-12 (×2): qty 100

## 2016-12-12 MED ORDER — SODIUM CHLORIDE 0.9 % IV SOLN: INTRAVENOUS | Status: AC

## 2016-12-12 NOTE — Patient Instructions (Addendum)
You will need to go to the hospital and have this further evaluated and treated. You are not a good candidate for oral diabetic medications with your kidney function and with the acids building up in your system, and I think you will need placed on insulin. He need some further IV fluids in the meanwhile.  It is my opinion that your diabetes would probably be best followed by a diabetic specialist (endocrinologist)  Follow-up per what ever they recommended on discharge.    SPOKE WITH THE HOSPITALIST: You are to go to [facility] , I spoke with Dr. Candiss Norse. They will admit you to begin treatment with insulin.    IF you received an x-ray today, you will receive an invoice from [facility]. Please contact [facility] at [phone] with questions or concerns regarding your invoice.   IF you received labwork today, you will receive an invoice from [facility]. Please contact [facility] at [phone] with questions or concerns regarding your invoice.   Our billing staff will not be able to assist you with questions regarding bills from these companies.  You will be contacted with the lab results as soon as they are available. The fastest way to get your results is to activate your My Chart account. Instructions are located on the last page of this paperwork. If you have not heard from [place] regarding the results in 2 weeks, please contact this office.

## 2016-12-12 NOTE — Progress Notes (Signed)
Patient ID: Marc Long, male    DOB: 21-May-1976  Age: 41 y.o. MRN: [id number]  Chief Complaint  Patient presents with  . Diabetic consult    concerns of taking Metformin due to lactic acid, seen at [facility] ED on 12/11/16, per pt diabetic foot exam performed 1 to 2 days ago     Subjective:   41 year old male who was diagnosed couple of days ago as being diabetic and begun on metformin. He was feeling bad yesterday and went to the emergency room at [facility] and the initial blood sugar was quite high, greater than 700. He was treated with a couple of liters of IV fluid. They did a number of blood tests on him, and the BUN and creatinine were little bit elevated as was the lactic acid level. He was instructed to come back here today and not to take the metformin. He returns today, still not feeling well, In fact he feels a little worse than yesterday, and having difficulty with his vision. He denies excessive thirst or urination. He is not having any chest pain or shortness of breath or palpitations. No major GI or GU symptoms. He is alert and oriented. He is on psychotropic drugs, has been on lithium since late last year. The level was recently reduced. Do not have a recent set of labs that shows sugars and kidney function prior to a couple of days ago.  Current allergies, medications, problem list, past/family and social histories reviewed.  Objective:  BP 135/88   Pulse 97   Temp 98.7 F (37.1 C) (Oral)   Resp 16   Ht 5\' 11"  (1.803 m)   Wt (!) 322 lb 3.2 oz (146.1 kg)   SpO2 96%   BMI 44.94 kg/m   Overweight male, alert and oriented, currently by his mother. Throat is clear and mucous membranes moist. Neck supple without nodes. Chest is clear to [place]. Heart regular without murmurs, gallops, or arrhythmias. Abdomen is soft with some mild lower abdominal tenderness. He says he has had diverticulitis in that region before.No [place] respirations.  Results for orders placed  or performed in visit on 12/12/16  POCT glucose (manual entry)  Result Value Ref Range   POC Glucose 404 (A) 70 - 99 mg/dl  POCT urinalysis dipstick  Result Value Ref Range   Color, UA yellow yellow   Clarity, UA clear clear   Glucose, UA =500 (A) negative mg/dL   Bilirubin, UA negative negative   Ketones, POC UA >= (160) (A) negative mg/dL   Spec Grav, UA 1.015 1.010 - 1.025   Blood, UA trace-intact (A) negative   pH, UA 5.5 5.0 - 8.0   Protein Ur, POC =30 (A) negative mg/dL   Urobilinogen, UA 0.2 0.2 or 1.0 E.U./dL   Nitrite, UA Negative Negative   Leukocytes, UA Negative Negative     Assessment & Plan:   Assessment: 1. Type 2 diabetes mellitus with chronic kidney disease, without long-term current use of insulin, unspecified CKD stage ([place])   2. Type 2 diabetes mellitus with ketoacidosis without coma, without long-term current use of insulin ([facility])       Plan: Check the sugar and urine dip. We cannot do in-house lactic acid or kidney function testing, but will send those out stat.  Abilify is known to cause some people to have increased blood sugars.  I will contact the hospitalist and see if they can directly admitted you.  Note: I spoke with Dr. Candiss Norse and  he has accepted the patient for admission.   Orders Placed This Encounter  Procedures  . Basic metabolic panel  . Lactic Acid, Plasma  . POCT glucose (manual entry)  . POCT urinalysis dipstick    No orders of the defined types were placed in this encounter.        Patient Instructions   You will need to go to the hospital and have this further evaluated and treated. You are not a good candidate for oral diabetic medications with your kidney function and with the acids building up in your system, and I think you will need placed on insulin. He need some further IV fluids in the meanwhile.  It is my opinion that your diabetes would probably be best followed by a diabetic specialist  (endocrinologist)  Follow-up per what ever they recommended on discharge.    IF you received an x-ray today, you will receive an invoice from [facility]. Please contact [facility] at [phone] with questions or concerns regarding your invoice.   IF you received labwork today, you will receive an invoice from [facility]. Please contact [facility] at [phone] with questions or concerns regarding your invoice.   Our billing staff will not be able to assist you with questions regarding bills from these companies.  You will be contacted with the lab results as soon as they are available. The fastest way to get your results is to activate your My Chart account. Instructions are located on the last page of this paperwork. If you have not heard from [place] regarding the results in 2 weeks, please contact this office.        No Follow-up on file.   HOPPER,DAVID, MD 12/12/2016

## 2016-12-12 NOTE — Progress Notes (Signed)
Inpatient Diabetes Program Recommendations  AACE/ADA: New Consensus Statement on Inpatient Glycemic Control (2015)  Target Ranges:  Prepandial:   less than 140 mg/dL      Peak postprandial:   less than 180 mg/dL (1-2 hours)      Critically ill patients:  140 - 180 mg/dL   Lab Results  Component Value Date   GLUCAP 278 (H) 12/12/2016   HGBA1C 10.8 12/10/2016    Review of Glycemic Control  Diabetes history: New-onset DM Outpatient Diabetes medications: recently started on metformin 500 mg bid Current orders for Inpatient glycemic control: IV insulin per DKA orders  Inpatient Diabetes Program Recommendations:    Continue insulin drip until criteria met to transition to basal-bolus insulin. Give Lantus 2 hours prior to discontinuation of drip. Will order Living Well book, diabetes videos and OP Diabetes Education consult.  Thank you. Lorenda Peck, RD, LDN, CDE Inpatient Diabetes Coordinator [phone]

## 2016-12-12 NOTE — Telephone Encounter (Signed)
Called pt to make aware that he has a bed on 5E at [facility] for hospital admission today.

## 2016-12-12 NOTE — H&P (Signed)
History and Physical    Marc Long JSH:[id number] DOB: [birth year] DOA: 12/12/2016  PCP: [name], MD   I have briefly reviewed patients previous medical reports in [facility].  Patient coming from: Home.  Chief Complaint: Frequent urination, thirst, dry mouth, blurred vision and high blood sugars.  HPI: Marc Long is a 41 year old single male, lives with a roommate, IADL, PMH of newly diagnosed DM 2, bipolar disorder, morbid obesity, presented to the ED from PCPs office with about complaints. He gets approximately 1 week history of polyuria, polydipsia, dry mouth, generalized weakness, blurred vision and was seen by his PCP on 12/10/16, labs were drawn, he was given a prescription for metformin and sent home. He subsequently received a call at home advising him to come to the ED due to blood sugar 743 mg per DL. In the ED, he was hydrated with IV fluids and his blood sugar came down to 369, creatinine improved from 1.82 to 1.45, hyperkalemia resolved, lactate normalized and he was advised to follow-up with his PCP today which he did. Patient continues to have weakness, dry mouth, blurred vision but states that his urinary frequency has decreased. He was sent to the hospital as a direct admission for further evaluation and management.   ED Course: Patient came to the medical floor as a direct admission and did not come through the ED today.  Review of Systems:  Patient reports recent changes in his psychiatric medications in April when lithium dose was reduced and Abilify was discontinued. However due to ongoing symptoms, patient went back to his prior dose of lithium and resumed Abilify 3 weeks ago but he updated his psychiatrist about the change. He reports ongoing suicidal ideations at least once a day. He reports that he tries to hide away things that he is scared of hurting himself with. For example he has given his large knife to his roommate to hideaway in the gun safe. He keeps his  kitchen knives high up on a closet where he cannot easily see them and states that "out of sight, out of mind". All other systems reviewed and apart from HPI, are negative.  Past Medical History:  Diagnosis Date  . Depression   . Diabetes mellitus without complication ([place])   . Gallstones   . Obesity, Class III, BMI 40-49.9 (morbid obesity) ([place])     History reviewed. No pertinent surgical history.  Social History  reports that he has never smoked. He has never used smokeless tobacco. He reports that he drinks alcohol. He reports that he does not use drugs.  Allergies  Allergen Reactions  . Bee Venom Swelling  . Keflex [Cephalexin]   . Relafen [Nabumetone]     Blood in stool  . Shellfish Allergy Nausea Only    Family History  Problem Relation Age of Onset  . Hypertension Mother   . Depression Mother   . Heart attack Maternal Grandfather   . Lymphoma Maternal Grandmother   . Diabetes Maternal Aunt   . Cancer Maternal Aunt      Prior to Admission medications   Medication Sig Start Date End Date Taking? Authorizing Provider  ARIPiprazole (ABILIFY) 5 MG tablet TK 1 T PO QD 11/25/16   [provider]  lamoTRIgine (LAMICTAL) 200 MG tablet TK 1 T PO QD 11/25/16   [provider]  lithium carbonate (LITHOBID) 300 MG CR tablet Take 3 tablets by mouth daily 11/25/16   [provider]  LORazepam (ATIVAN) 0.5 MG tablet  Take 0.5 mg by mouth every 8 (eight) hours as needed for anxiety.     [provider]  metFORMIN (GLUCOPHAGE) 500 MG tablet Take 1 tablet (500 mg total) by mouth 2 (two) times daily with a meal. Wk1: 1/2 tab 2x/day; W 2: 1 tab in am, 1/2 tab pm; Wk3: 2 tabs 2x/day. Patient not taking: Reported on 12/12/2016 12/10/16   McVey, Gelene Mink, PA-C  Multiple Vitamins-Minerals (MULTIVITAMIN WITH MINERALS) tablet Take 1 tablet by mouth daily.    [provider]    Physical Exam: Vitals:   12/12/16 1440  BP: (!) 144/93  Pulse:  93  Resp: (!) 22  Temp: 97.9 F (36.6 C)  TempSrc: Oral  SpO2: 96%      Constitutional: Pleasant young male, moderately built and morbidly obese, lying comfortably supine in bed. Eyes: PERTLA, lids and conjunctivae normal ENMT: Mucous membranes are dry. Posterior pharynx clear of any exudate or lesions. Normal dentition.  Neck: supple, no masses, no thyromegaly Respiratory: clear to auscultation bilaterally, no wheezing, no crackles. Normal respiratory effort. No accessory muscle use.  Cardiovascular: S1 & S2 heard, regular rate and rhythm, no murmurs / rubs / gallops. No extremity edema. 2+ pedal pulses. No carotid bruits.  Abdomen: No distension, no tenderness, no masses palpated. No hepatosplenomegaly. Bowel sounds normal.  Musculoskeletal: no clubbing / cyanosis. No joint deformity upper and lower extremities. Good ROM, no contractures. Normal muscle tone.  Skin: no rashes, lesions, ulcers. No induration Neurologic: CN 2-12 grossly intact. Sensation intact, DTR normal. Strength 5/5 in all 4 limbs.  Psychiatric: Normal judgment and insight. Alert and oriented x 3. Normal mood. Flat affect.    Labs on Admission: I have personally reviewed following labs and imaging studies  CBC:  Recent Labs Lab 12/10/16 1703 12/11/16 1456 12/12/16 1431  WBC 11.7* 12.2* 9.8  HGB 16.1 16.6 15.0  HCT 44.2 46.0 41.7  MCV 84.1 82.4 83.4  PLT  --  330 914   Basic Metabolic Panel:  Recent Labs Lab 12/10/16 1636 12/11/16 1456 12/11/16 2043 12/12/16 1201  NA 128* 131* 131* 130*  K 5.6* 4.9 4.3 5.0  CL 85* 94* 100* 94*  CO2 18* 20* 16* 15*  GLUCOSE 743* 442* 369* 437*  BUN 29* 26* 22* 21*  CREATININE 1.82* 1.78* 1.45* 1.60*  CALCIUM 10.7* 10.0 9.0 9.4   Liver Function Tests:  Recent Labs Lab 12/10/16 1636 12/11/16 1456  AST 32 40  ALT 44 45  ALKPHOS 141* 91  BILITOT 0.3 1.2  PROT 8.2 8.3*  ALBUMIN 5.0 4.6   HbA1C:  Recent Labs  12/10/16 1707  HGBA1C 10.8    CBG:  Recent Labs Lab 12/11/16 1454 12/11/16 2216  GLUCAP 396* 376*   Urine analysis:    Component Value Date/Time   COLORURINE YELLOW 12/11/2016 1455   APPEARANCEUR CLEAR 12/11/2016 1455   LABSPEC 1.028 12/11/2016 1455   PHURINE 5.0 12/11/2016 1455   GLUCOSEU >=500 (A) 12/11/2016 1455   HGBUR MODERATE (A) 12/11/2016 1455   BILIRUBINUR negative 12/12/2016 1205   KETONESUR >= (160) (A) 12/12/2016 1205   KETONESUR 80 (A) 12/11/2016 1455   PROTEINUR =30 (A) 12/12/2016 1205   PROTEINUR 100 (A) 12/11/2016 1455   UROBILINOGEN 0.2 12/12/2016 1205   UROBILINOGEN 0.2 02/16/2016 1840   NITRITE Negative 12/12/2016 1205   NITRITE NEGATIVE 12/11/2016 1455   LEUKOCYTESUR Negative 12/12/2016 1205       Assessment/Plan Principal Problem:   DKA, type 2, not at goal [facility]) Active  Problems:   Lithium use   DM (diabetes mellitus), type 2, uncontrolled (HCC)   Dehydration   Suicidal ideations   Obesity, Class III, BMI 40-49.9 (morbid obesity) (HCC)   Bipolar disorder (manic depression) (HCC)   Hyperglycemia due to type 2 diabetes mellitus ([place])   AKI (acute kidney injury) ([place])     1. DKA type II, not at goal: Ongoing symptoms for approximately last 1 week. A1c: 10.8. Treated in ED day prior to admission with IV fluids. Recently started on metformin of which she has only taken one dose. Seen by PCP on day of admission and accepted to medical floor (labs were pending). At the time of this dictation, labs sent from PCPs office have just returned and indicate DKA. Repeat labs from the hospital are pending. Thereby will transfer patient from medical floor to stepdown unit for close monitoring and management. Treat per DKA protocol with aggressive IV fluids, insulin drip, frequent BMP monitoring. After DKA resolves, transitioned to Lantus and SSI. Diabetes education. 2. Dehydration with hyponatremia: Secondary to hyperglycemia from DM. Also may be related to lithium. IV fluids.  3. Acute  kidney injury: Patient may have chronic kidney disease. His creatinine in October 2017 was 1.3. Creatinine on 6/27:1.82. Today's creatinine is 1.6. IV fluids and follow BMP closely. 4. Bipolar disorder with suicidal ideations: Suicide precautions. Suicide sitter. Psychiatric consulted. Resume home medications after pharmacy has reconciled. Patient states that he sees Dr. Sharlynn Oliphant, Psychiatrist 5. Morbid obesity/There is no height or weight on file to calculate BMI. Patient needs to lose weight.    DVT prophylaxis: Lovenox  Code Status: Full  Family Communication: Discussed in detail with patient's mother at bedside.  Disposition Plan: DC home when medically improved and stable.  Consults called: Diabetes coordinator  Admission status: Observation, stepdown unit.   Severity of Illness: The appropriate patient status for this patient is OBSERVATION. Observation status is judged to be reasonable and necessary in order to provide the required intensity of service to ensure the patient's safety. The patient's presenting symptoms, physical exam findings, and initial radiographic and laboratory data in the context of their medical condition is felt to place them at decreased risk for further clinical deterioration. Furthermore, it is anticipated that the patient will be medically stable for discharge from the hospital within 2 midnights of admission. The following factors support the patient status of observation.   " The patient's presenting symptoms include polyuria, polydipsia, dry mouth, blurred vision, generalized weakness, suicidal ideations. " The physical exam findings include dry mucous membranes. " The initial radiographic and laboratory data are suggestive of DKA.Marland Kitchen       [facility] MD [facility] Pager [phone]  If 7PM-7AM, please contact night-coverage [email or web address] Password [email or web address]  12/12/2016, 3:06 PM

## 2016-12-12 NOTE — Care Management Note (Signed)
Case Management Note  Patient Details  Name: Marc Long MRN: [id number] Date of Birth: [birth year]  Subjective/Objective: 41 y/o m admitted w/DM. From home.                   Action/Plan:d/c plan home.   Expected Discharge Date:                  Expected Discharge Plan:  Home/Self Care  In-House Referral:     Discharge planning Services  CM Consult  Post Acute Care Choice:    Choice offered to:     DME Arranged:    DME Agency:     HH Arranged:    HH Agency:     Status of Service:  In process, will continue to follow  If discussed at Long Length of Stay Meetings, dates discussed:    Additional Comments:  Dessa Phi, RN 12/12/2016, 3:01 PM

## 2016-12-13 DIAGNOSIS — E860 Dehydration: Secondary | ICD-10-CM | POA: Diagnosis not present

## 2016-12-13 DIAGNOSIS — F3130 Bipolar disorder, current episode depressed, mild or moderate severity, unspecified: Secondary | ICD-10-CM | POA: Diagnosis not present

## 2016-12-13 DIAGNOSIS — N179 Acute kidney failure, unspecified: Secondary | ICD-10-CM | POA: Diagnosis not present

## 2016-12-13 DIAGNOSIS — E1165 Type 2 diabetes mellitus with hyperglycemia: Secondary | ICD-10-CM

## 2016-12-13 DIAGNOSIS — E1110 Type 2 diabetes mellitus with ketoacidosis without coma: Secondary | ICD-10-CM | POA: Diagnosis not present

## 2016-12-13 LAB — URINALYSIS, ROUTINE W REFLEX MICROSCOPIC
BILIRUBIN URINE: NEGATIVE
Bacteria, UA: NONE SEEN
HGB URINE DIPSTICK: NEGATIVE
Ketones, ur: 80 mg/dL — AB
LEUKOCYTES UA: NEGATIVE
NITRITE: NEGATIVE
PROTEIN: 100 mg/dL — AB
SPECIFIC GRAVITY, URINE: 1.024 (ref 1.005–1.030)
pH: 5 (ref 5.0–8.0)

## 2016-12-13 LAB — BASIC METABOLIC PANEL
ANION GAP: 9 (ref 5–15)
Anion gap: 9 (ref 5–15)
Anion gap: 9 (ref 5–15)
BUN: 16 mg/dL (ref 6–20)
BUN: 16 mg/dL (ref 6–20)
BUN: 14 mg/dL (ref 6–20)
CALCIUM: 8.5 mg/dL — AB (ref 8.9–10.3)
CALCIUM: 8.5 mg/dL — AB (ref 8.9–10.3)
CHLORIDE: 104 mmol/L (ref 101–111)
CO2: 20 mmol/L — ABNORMAL LOW (ref 22–32)
CO2: 22 mmol/L (ref 22–32)
CO2: 20 mmol/L — AB (ref 22–32)
CREATININE: 1.27 mg/dL — AB (ref 0.61–1.24)
Calcium: 8.5 mg/dL — ABNORMAL LOW (ref 8.9–10.3)
Chloride: 107 mmol/L (ref 101–111)
Chloride: 105 mmol/L (ref 101–111)
Creatinine, Ser: 1.2 mg/dL (ref 0.61–1.24)
Creatinine, Ser: 1.25 mg/dL — ABNORMAL HIGH (ref 0.61–1.24)
GFR calc Af Amer: >60 mL/min (ref >60)
GFR calc Af Amer: >60 mL/min (ref >60)
GFR calc Af Amer: >60 mL/min (ref >60)
GFR calc non Af Amer: >60 mL/min (ref >60)
GLUCOSE: 335 mg/dL — AB (ref 65–99)
GLUCOSE: 164 mg/dL — AB (ref 65–99)
GLUCOSE: 173 mg/dL — AB (ref 65–99)
POTASSIUM: 3.9 mmol/L (ref 3.5–5.1)
POTASSIUM: 3.2 mmol/L — AB (ref 3.5–5.1)
Potassium: 3.3 mmol/L — ABNORMAL LOW (ref 3.5–5.1)
SODIUM: 133 mmol/L — AB (ref 135–145)
SODIUM: 136 mmol/L (ref 135–145)
SODIUM: 136 mmol/L (ref 135–145)

## 2016-12-13 LAB — GLUCOSE, CAPILLARY
GLUCOSE-CAPILLARY: 135 mg/dL — AB (ref 65–99)
GLUCOSE-CAPILLARY: 155 mg/dL — AB (ref 65–99)
GLUCOSE-CAPILLARY: 169 mg/dL — AB (ref 65–99)
GLUCOSE-CAPILLARY: 174 mg/dL — AB (ref 65–99)
GLUCOSE-CAPILLARY: 194 mg/dL — AB (ref 65–99)
GLUCOSE-CAPILLARY: 333 mg/dL — AB (ref 65–99)
Glucose-Capillary: 131 mg/dL — ABNORMAL HIGH (ref 65–99)
Glucose-Capillary: 186 mg/dL — ABNORMAL HIGH (ref 65–99)
Glucose-Capillary: 176 mg/dL — ABNORMAL HIGH (ref 65–99)
Glucose-Capillary: 193 mg/dL — ABNORMAL HIGH (ref 65–99)
Glucose-Capillary: 343 mg/dL — ABNORMAL HIGH (ref 65–99)
Glucose-Capillary: 286 mg/dL — ABNORMAL HIGH (ref 65–99)

## 2016-12-13 LAB — HIV ANTIBODY (ROUTINE TESTING W REFLEX): HIV Screen 4th Generation wRfx: NONREACTIVE

## 2016-12-13 LAB — MAGNESIUM: MAGNESIUM: 2.2 mg/dL (ref 1.7–2.4)

## 2016-12-13 MED ORDER — INSULIN GLARGINE 100 UNIT/ML ~~LOC~~ SOLN
10.0000 [IU] | Freq: Every day | SUBCUTANEOUS | Status: DC
  Administered 2016-12-13: 10 [IU] via SUBCUTANEOUS
  Filled 2016-12-13: qty 0.1

## 2016-12-13 MED ORDER — POTASSIUM CHLORIDE 10 MEQ/100ML IV SOLN
10.0000 meq | INTRAVENOUS | Status: AC
  Administered 2016-12-13 (×3): 10 meq via INTRAVENOUS
  Filled 2016-12-13 (×3): qty 100

## 2016-12-13 MED ORDER — INSULIN ASPART 100 UNIT/ML ~~LOC~~ SOLN
0.0000 [IU] | Freq: Three times a day (TID) | SUBCUTANEOUS | Status: DC
  Administered 2016-12-13: 4 [IU] via SUBCUTANEOUS

## 2016-12-13 MED ORDER — INSULIN ASPART 100 UNIT/ML ~~LOC~~ SOLN
0.0000 [IU] | Freq: Three times a day (TID) | SUBCUTANEOUS | Status: DC
Start: 2016-12-13 — End: 2016-12-15
  Administered 2016-12-13 (×2): 11 [IU] via SUBCUTANEOUS
  Administered 2016-12-14: 5 [IU] via SUBCUTANEOUS
  Administered 2016-12-14: 8 [IU] via SUBCUTANEOUS
  Administered 2016-12-14: 15 [IU] via SUBCUTANEOUS
  Administered 2016-12-15: 8 [IU] via SUBCUTANEOUS
  Administered 2016-12-15: 15 [IU] via SUBCUTANEOUS

## 2016-12-13 MED ORDER — INSULIN GLARGINE 100 UNIT/ML ~~LOC~~ SOLN
10.0000 [IU] | Freq: Once | SUBCUTANEOUS | Status: AC
  Administered 2016-12-13: 10 [IU] via SUBCUTANEOUS
  Filled 2016-12-13 (×2): qty 0.1

## 2016-12-13 MED ORDER — INSULIN GLARGINE 100 UNIT/ML ~~LOC~~ SOLN
20.0000 [IU] | Freq: Every day | SUBCUTANEOUS | Status: DC
  Filled 2016-12-13: qty 0.2

## 2016-12-13 MED ORDER — POTASSIUM CHLORIDE IN NACL 40-0.9 MEQ/L-% IV SOLN
INTRAVENOUS | Status: DC
  Administered 2016-12-13 – 2016-12-14 (×2): 100 mL/h via INTRAVENOUS
  Filled 2016-12-13 (×3): qty 1000

## 2016-12-13 MED ORDER — INSULIN ASPART 100 UNIT/ML ~~LOC~~ SOLN
4.0000 [IU] | Freq: Once | SUBCUTANEOUS | Status: AC
  Administered 2016-12-13: 4 [IU] via SUBCUTANEOUS

## 2016-12-13 MED ORDER — INSULIN ASPART 100 UNIT/ML ~~LOC~~ SOLN
0.0000 [IU] | Freq: Every day | SUBCUTANEOUS | Status: DC
Start: 2016-12-13 — End: 2016-12-15
  Administered 2016-12-13: 3 [IU] via SUBCUTANEOUS
  Administered 2016-12-14: 5 [IU] via SUBCUTANEOUS

## 2016-12-13 MED ORDER — INSULIN ASPART 100 UNIT/ML ~~LOC~~ SOLN
4.0000 [IU] | Freq: Three times a day (TID) | SUBCUTANEOUS | Status: DC
  Administered 2016-12-13 – 2016-12-14 (×4): 4 [IU] via SUBCUTANEOUS

## 2016-12-13 MED ORDER — POTASSIUM CHLORIDE IN NACL 40-0.9 MEQ/L-% IV SOLN
INTRAVENOUS | Status: DC
  Administered 2016-12-13: 100 mL/h via INTRAVENOUS
  Filled 2016-12-13: qty 1000

## 2016-12-13 NOTE — Progress Notes (Addendum)
PROGRESS NOTE   Marc Long  FTD:[id number]    DOB: 1975/10/04    DOA: 12/12/2016  PCP: [name], MD   I have briefly reviewed patients previous medical records in [facility].  Brief Narrative:  41 year old male, PMH of newly diagnosed DM (diagnosed earlier the week of admission), bipolar disorder, morbid obesity, recently seen in ED for hyperglycemia (CBG >700), admitted from PCPs office for DKA, dehydration, acute kidney injury and ongoing suicidal ideations. DKA resolved. Transitioned to Lantus and SSI. Transfer to medical bed. Psychiatry input pending.   Assessment & Plan:   Principal Problem:   DKA, type 2, not at goal [facility]) Active Problems:   Lithium use   DM (diabetes mellitus), type 2, uncontrolled ([place])   Dehydration   Suicidal ideations   Obesity, Class III, BMI 40-49.9 (morbid obesity) (HCC)   Bipolar disorder (manic depression) (HCC)   Hyperglycemia due to type 2 diabetes mellitus ([place])   AKI (acute kidney injury) ([place])   1. DKA type II, not at goal: A1c 10.8. Recently started on metformin and has taken only one dose thus far. Admitted to stepdown. Treated per protocol with aggressive IV fluids, insulin drip, frequent BMP monitoring. DKA resolved and transitioned to Lantus and SSI. Diabetes coordinator input appreciated. Adjusted insulins (increased Lantus to 20 units subcutaneously daily, changed SSI to moderate with bedtime scale). May need further adjustment. Diabetes education. 2. Dehydration with hyponatremia: Improved. Hyponatremia resolved. Continue additional day of IV fluid hydration. 3. Acute kidney injury: Creatinine has improved from 1.6-1.25. Unsure if he has some degree of baseline chronic kidney disease. Continue IV fluid hydration and follow BMP in a.m. 4. Bipolar disorder with suicidal ideations: Continue suicide precautions and [employer] cabin [employer]. Psychiatric consulted and input pending. Abilify can contribute to hyperglycemia> Psych to see if this can  be changed or dose reduced. 5. Morbid obesity/Body mass index is 44.92 kg/m.  6. Hypokalemia: Replace and follow.   DVT prophylaxis: Lovenox Code Status: Full Family Communication: None at bedside Disposition: DC home when medically stable and cleared by psychiatry. Stable for transfer to medical bed.   Consultants:  Psychiatry-pending   Procedures:  None  Antimicrobials:  None    Subjective: Feels better. Mouth still dry. Decreased urinary frequency. No pain reported. Wants to know about carb counting. Reports some new dysuria. Not sexually active for the last 6 months. No penile discharge or lesions reported.  ROS: No dizziness or lightheadedness. As per RN, no acute issues.  Objective:  Vitals:   12/13/16 0400 12/13/16 0600 12/13/16 0800 12/13/16 1000  BP: (!) 141/87 132/82 (!) 144/107 136/78  Pulse: 84 77 87 96  Resp: (!) 22 (!) 22 (!) 23 15  Temp:   97.6 F (36.4 C)   TempSrc:   Oral   SpO2: 94% 94% 92% 95%  Weight:      Height:        Examination:  General exam: Young male, moderately built and obese, lying comfortably in bed Respiratory system: Clear to auscultation. Respiratory effort normal. Cardiovascular system: S1 & S2 heard, RRR. No JVD, murmurs, rubs, gallops or clicks. No pedal edema. Telemetry: Sinus rhythm. Gastrointestinal system: Abdomen is nondistended, soft and nontender. No organomegaly or masses felt. Normal bowel sounds heard. Central nervous system: Alert and oriented. No focal neurological deficits. Extremities: Symmetric 5 x 5 power. Skin: No rashes, lesions or ulcers Psychiatry: Judgement and insight appear normal. Mood & affect flat.     Data Reviewed: I have personally reviewed  following labs and imaging studies  CBC:  Recent Labs Lab 12/10/16 1703 12/11/16 1456 12/12/16 1431  WBC 11.7* 12.2* 9.8  HGB 16.1 16.6 15.0  HCT 44.2 46.0 41.7  MCV 84.1 82.4 83.4  PLT  --  330 299   Basic Metabolic Panel:  Recent Labs Lab  12/12/16 1201 12/12/16 1431 12/12/16 2025 12/13/16 0004 12/13/16 0411  NA 130* 130* 135 136 136  K 5.0 4.6 4.1 3.2* 3.3*  CL 94* 98* 105 107 105  CO2 15* 15* 19* 20* 22  GLUCOSE 437* 396* 255* 164* 173*  BUN 21* 21* 18 16 14   CREATININE 1.60* 1.57* 1.39* 1.20 1.25*  CALCIUM 9.4 9.1 8.6* 8.5* 8.5*   Liver Function Tests:  Recent Labs Lab 12/10/16 1636 12/11/16 1456 12/12/16 1431  AST 32 40 39  ALT 44 45 46  ALKPHOS 141* 91 72  BILITOT 0.3 1.2 0.9  PROT 8.2 8.3* 7.4  ALBUMIN 5.0 4.6 4.1   HbA1C:  Recent Labs  12/10/16 1707  HGBA1C 10.8   CBG:  Recent Labs Lab 12/13/16 0418 12/13/16 0522 12/13/16 0621 12/13/16 0647 12/13/16 0749  GLUCAP 174* 194* 186* 176* 193*    Recent Results (from the past 240 hour(s))  Urine Culture     Status: None (Preliminary result)   Collection Time: 12/10/16  4:36 PM  Result Value Ref Range Status   Urine Culture, Routine Preliminary report  Preliminary   Organism ID, Bacteria Comment  Preliminary    Comment: Microbiological testing to rule out the presence of possible pathogens is in progress. 25,000-50,000 colony forming units per mL   MRSA PCR Screening     Status: None   Collection Time: 12/12/16  5:16 PM  Result Value Ref Range Status   MRSA by PCR NEGATIVE NEGATIVE Final    Comment:        The GeneXpert MRSA Assay (FDA approved for NASAL specimens only), is one component of a comprehensive MRSA colonization surveillance program. It is not intended to diagnose MRSA infection nor to guide or monitor treatment for MRSA infections.          Radiology Studies: No results found.      Scheduled Meds: . ARIPiprazole  5 mg Oral Daily  . enoxaparin (LOVENOX) injection  60 mg Subcutaneous Q24H  . insulin aspart  0-15 Units Subcutaneous TID WC  . insulin aspart  0-5 Units Subcutaneous QHS  . insulin glargine  10 Units Subcutaneous Once  . [START ON 12/14/2016] insulin glargine  20 Units Subcutaneous Daily  .  lamoTRIgine  200 mg Oral Daily  . lithium carbonate  900 mg Oral Daily  . multivitamin with minerals  1 tablet Oral Daily  . polyethylene glycol  17 g Oral Daily   Continuous Infusions: . 0.9 % NaCl with KCl 40 mEq / L 100 mL/hr (12/13/16 0944)     LOS: 1 day     HONGALGI,ANAND, MD, FACP, FHM. [facility] Pager [phone]  If 7PM-7AM, please contact night-coverage [email or web address] Password [email or web address] 12/13/2016, 11:21 AM

## 2016-12-13 NOTE — Progress Notes (Signed)
Writer received report from ICU nurse at this time. Pt will be transferred from ICU to room 1507.

## 2016-12-13 NOTE — Progress Notes (Signed)
On call diabetes coordinator received consult. Noted that patient is new onset, has been on IV insulin. Had been given Metformin by PCP just a few days ago.    Recommend increasing Lantus dose to 25 units daily (0.2 units/kg X 146 kg = 29.2 units) and continuing a Novolog MODERATE correction scale TID & HS and adding Novolog 4 units TID with meals if patient eats at least 50% of meal.  Since Lantus 10 units has already been given, consider adding Lantus 15 units to the dose before HS.  Will have staff RNs teach patient to give insulin and to review DM survival skills before discharge. Will need to see PCP for follow up.   Harvel Ricks RN BSN CDE Diabetes Coordinator Pager: [phone]  8am-5pm

## 2016-12-13 NOTE — Progress Notes (Signed)
Pt arrived to room 1507 from ICU with sitter.

## 2016-12-14 DIAGNOSIS — N179 Acute kidney failure, unspecified: Secondary | ICD-10-CM | POA: Diagnosis not present

## 2016-12-14 DIAGNOSIS — E860 Dehydration: Secondary | ICD-10-CM | POA: Diagnosis not present

## 2016-12-14 DIAGNOSIS — F319 Bipolar disorder, unspecified: Secondary | ICD-10-CM

## 2016-12-14 DIAGNOSIS — Z818 Family history of other mental and behavioral disorders: Secondary | ICD-10-CM

## 2016-12-14 DIAGNOSIS — Z79899 Other long term (current) drug therapy: Secondary | ICD-10-CM

## 2016-12-14 DIAGNOSIS — E1110 Type 2 diabetes mellitus with ketoacidosis without coma: Secondary | ICD-10-CM | POA: Diagnosis not present

## 2016-12-14 DIAGNOSIS — E1165 Type 2 diabetes mellitus with hyperglycemia: Secondary | ICD-10-CM | POA: Diagnosis not present

## 2016-12-14 LAB — BASIC METABOLIC PANEL
Anion gap: 9 (ref 5–15)
BUN: 12 mg/dL (ref 6–20)
CALCIUM: 8.4 mg/dL — AB (ref 8.9–10.3)
CO2: 21 mmol/L — ABNORMAL LOW (ref 22–32)
CREATININE: 1.23 mg/dL (ref 0.61–1.24)
Chloride: 108 mmol/L (ref 101–111)
Glucose, Bld: 270 mg/dL — ABNORMAL HIGH (ref 65–99)
Potassium: 4.5 mmol/L (ref 3.5–5.1)
SODIUM: 138 mmol/L (ref 135–145)

## 2016-12-14 LAB — CBC
HCT: 37.6 % — ABNORMAL LOW (ref 39.0–52.0)
Hemoglobin: 13.1 g/dL (ref 13.0–17.0)
MCH: 29.2 pg (ref 26.0–34.0)
MCHC: 34.8 g/dL (ref 30.0–36.0)
MCV: 83.9 fL (ref 78.0–100.0)
Platelets: 235 10*3/uL (ref 150–400)
RBC: 4.48 MIL/uL (ref 4.22–5.81)
RDW: 13.7 % (ref 11.5–15.5)
WBC: 6.6 10*3/uL (ref 4.0–10.5)

## 2016-12-14 LAB — GLUCOSE, CAPILLARY
GLUCOSE-CAPILLARY: 256 mg/dL — AB (ref 65–99)
GLUCOSE-CAPILLARY: 402 mg/dL — AB (ref 65–99)
GLUCOSE-CAPILLARY: 233 mg/dL — AB (ref 65–99)
Glucose-Capillary: 364 mg/dL — ABNORMAL HIGH (ref 65–99)

## 2016-12-14 LAB — URINE CULTURE

## 2016-12-14 MED ORDER — INSULIN GLARGINE 100 UNIT/ML ~~LOC~~ SOLN
25.0000 [IU] | Freq: Every day | SUBCUTANEOUS | Status: DC
  Administered 2016-12-14: 25 [IU] via SUBCUTANEOUS
  Filled 2016-12-14: qty 0.25

## 2016-12-14 MED ORDER — INSULIN GLARGINE 100 UNIT/ML ~~LOC~~ SOLN
5.0000 [IU] | Freq: Once | SUBCUTANEOUS | Status: AC
  Administered 2016-12-14: 5 [IU] via SUBCUTANEOUS
  Filled 2016-12-14: qty 0.05

## 2016-12-14 MED ORDER — INSULIN ASPART 100 UNIT/ML ~~LOC~~ SOLN
6.0000 [IU] | Freq: Three times a day (TID) | SUBCUTANEOUS | Status: DC
  Administered 2016-12-14 – 2016-12-15 (×3): 6 [IU] via SUBCUTANEOUS

## 2016-12-14 MED ORDER — INSULIN GLARGINE 100 UNIT/ML ~~LOC~~ SOLN
30.0000 [IU] | Freq: Every day | SUBCUTANEOUS | Status: DC
  Filled 2016-12-14: qty 0.3

## 2016-12-14 MED ORDER — DOCUSATE SODIUM 100 MG PO CAPS
200.0000 mg | ORAL_CAPSULE | Freq: Two times a day (BID) | ORAL | Status: DC
  Administered 2016-12-14 – 2016-12-15 (×2): 200 mg via ORAL
  Filled 2016-12-14 (×2): qty 2

## 2016-12-14 NOTE — Progress Notes (Signed)
Alerted MD to pt having blood in stool. Lovenox d/c.

## 2016-12-14 NOTE — Progress Notes (Signed)
PROGRESS NOTE   Marc Long  ZLD:[id number]    DOB: 1975-09-21    DOA: 12/12/2016  PCP: [name], MD   I have briefly reviewed patients previous medical records in [facility].  Brief Narrative:  41 year old male, PMH of newly diagnosed DM (diagnosed earlier the week of admission), bipolar disorder, morbid obesity, recently seen in ED for hyperglycemia (CBG >700), admitted from PCPs office for DKA, dehydration, acute kidney injury and ongoing suicidal ideations. DKA resolved. Transitioned to Lantus and SSI. Transfer to medical bed. Psychiatry input pending.   Assessment & Plan:   Principal Problem:   DKA, type 2, not at goal [facility]) Active Problems:   Lithium use   DM (diabetes mellitus), type 2, uncontrolled ([place])   Dehydration   Suicidal ideations   Obesity, Class III, BMI 40-49.9 (morbid obesity) ([facility])   Bipolar disorder (manic depression) (H[facility])   Hyperglycemia due to type 2 diabetes mellitus ([place])   AKI (acute kidney injury) ([place])   1. DKA type II, not at goal: A1c 10.8. Recently started on metformin and has taken only one dose thus far. Admitted to stepdown. Treated per protocol with aggressive IV fluids, insulin drip, frequent BMP monitoring. DKA resolved and transitioned to Lantus and SSI. Diabetes coordinator input appreciated. Adjusted insulins (increased Lantus to 30 units subcutaneously daily, changed SSI to moderate with bedtime scale and increased mealtime NovoLog 6 units 3 times a day). May need further adjustment. Diabetes education. CBG since yesterday ranging between 256-402. 2. Dehydration with hyponatremia: Dehydration resolved. 3. Acute kidney injury: Acute kidney injury resolved. Baseline creatinine probably in the 1.2 range. 4. Bipolar disorder with suicidal ideations: Placed on suicide precautions, safety sitter and psychiatry was consulted who saw him today and indicated that there is no evidence of imminent risk to himself or others, does not meet criteria  for psychiatric inpatient admission and recommend patient follow-up with his primary psychiatrist for medication management. DC suicide precautions and sitter. 5. Morbid obesity/Body mass index is 44.92 kg/m.  6. Hypokalemia: Replaced 7. Rectal bleeding 1: As per RN, patient had an episode of bright red blood in toilet bowl associated with hard stools but no rectal pain or abdominal pain. He reported history of prior diverticulitis and? Hemorrhoids. Discontinued Lovenox. Monitor closely and if he has subsequent episodes then consider transitioning to nothing by mouth or clear liquids and getting GI consultation. Otherwise add stool softeners, outpatient follow-up and monitor CBC.   DVT prophylaxis: SCD's Code Status: Full Family Communication: None at bedside Disposition: DC home when medically stable and cleared by psychiatry. Stable for transfer to medical bed.   Consultants:  Psychiatry-pending   Procedures:  None  Antimicrobials:  None    Subjective: Continues to feel better. Some increased urinary frequency overnight. Dry mouth resolved.  ROS: No dizziness or lightheadedness. As per RN, no acute issues.  Objective:  Vitals:   12/13/16 1409 12/13/16 2040 12/14/16 0500 12/14/16 1303  BP: 140/83 128/89 133/83 131/78  Pulse: (!) 104 92 84 91  Resp: 18 18 17 18   Temp: 97.6 F (36.4 C) 97.9 F (36.6 C) 97.5 F (36.4 C) 98.4 F (36.9 C)  TempSrc: Oral Oral Oral Oral  SpO2: 98% 97% 99% 97%  Weight:      Height:        Examination:  General exam: Young male, moderately built and obese, lying comfortably in bed Respiratory system: Clear to auscultation. Respiratory effort normal.Stable without change. Cardiovascular system: S1 & S2 heard, RRR. No  JVD, murmurs, rubs, gallops or clicks. No pedal edema.  Gastrointestinal system: Abdomen is nondistended, soft and nontender. No organomegaly or masses felt. Normal bowel sounds heard. Stable without change. Central nervous  system: Alert and oriented. No focal neurological deficits. Extremities: Symmetric 5 x 5 power. Skin: No rashes, lesions or ulcers Psychiatry: Judgement and insight appear normal. Mood & affect flat.     Data Reviewed: I have personally reviewed following labs and imaging studies  CBC:  Recent Labs Lab 12/10/16 1703 12/11/16 1456 12/12/16 1431  WBC 11.7* 12.2* 9.8  HGB 16.1 16.6 15.0  HCT 44.2 46.0 41.7  MCV 84.1 82.4 83.4  PLT  --  330 295   Basic Metabolic Panel:  Recent Labs Lab 12/12/16 2025 12/13/16 0004 12/13/16 0411 12/13/16 1058 12/14/16 0704  NA 135 136 136 133* 138  K 4.1 3.2* 3.3* 3.9 4.5  CL 105 107 105 104 108  CO2 19* 20* 22 20* 21*  GLUCOSE 255* 164* 173* 335* 270*  BUN 18 16 14 16 12   CREATININE 1.39* 1.20 1.25* 1.27* 1.23  CALCIUM 8.6* 8.5* 8.5* 8.5* 8.4*  MG  --   --   --  2.2  --    Liver Function Tests:  Recent Labs Lab 12/10/16 1636 12/11/16 1456 12/12/16 1431  AST 32 40 39  ALT 44 45 46  ALKPHOS 141* 91 72  BILITOT 0.3 1.2 0.9  PROT 8.2 8.3* 7.4  ALBUMIN 5.0 4.6 4.1   HbA1C: No results for input(s): HGBA1C in the last 72 hours. CBG:  Recent Labs Lab 12/13/16 1159 12/13/16 1647 12/13/16 2038 12/14/16 0730 12/14/16 1120  GLUCAP 343* 333* 286* 256* 402*    Recent Results (from the past 240 hour(s))  Urine Culture     Status: None   Collection Time: 12/10/16  4:36 PM  Result Value Ref Range Status   Urine Culture, Routine Final report  Final   Organism ID, Bacteria Comment  Final    Comment: Mixed urogenital flora 25,000-50,000 colony forming units per mL   MRSA PCR Screening     Status: None   Collection Time: 12/12/16  5:16 PM  Result Value Ref Range Status   MRSA by PCR NEGATIVE NEGATIVE Final    Comment:        The GeneXpert MRSA Assay (FDA approved for NASAL specimens only), is one component of a comprehensive MRSA colonization surveillance program. It is not intended to diagnose MRSA infection nor to  guide or monitor treatment for MRSA infections.          Radiology Studies: No results found.      Scheduled Meds: . ARIPiprazole  5 mg Oral Daily  . insulin aspart  0-15 Units Subcutaneous TID WC  . insulin aspart  0-5 Units Subcutaneous QHS  . insulin aspart  6 Units Subcutaneous TID WC  . [START ON 12/15/2016] insulin glargine  30 Units Subcutaneous Daily  . insulin glargine  5 Units Subcutaneous Once  . lamoTRIgine  200 mg Oral Daily  . lithium carbonate  900 mg Oral Daily  . multivitamin with minerals  1 tablet Oral Daily  . polyethylene glycol  17 g Oral Daily   Continuous Infusions: . 0.9 % NaCl with KCl 40 mEq / L 100 mL/hr (12/14/16 0400)     LOS: 1 day     Georgianna Band, MD, FACP, FHM. [facility] Pager [phone]  If 7PM-7AM, please contact night-coverage [email or web address] Password [facility] 12/14/2016, 3:43 PM

## 2016-12-14 NOTE — Consult Note (Signed)
[facility] Consult   Reason for Consult:  Suicidal thoughts Referring Physician:  Dr. Conard Novak Patient Identification: Marc Long MRN:  [id number] Principal Diagnosis: DKA, type 2, not at goal [facility]) Diagnosis:   Patient Active Problem List   Diagnosis Date Noted  . DM (diabetes mellitus), type 2, uncontrolled ([place]) [E11.65] 12/12/2016  . Dehydration [E86.0] 12/12/2016  . Suicidal ideations [R45.851] 12/12/2016  . Bipolar disorder (manic depression) ([place]) [F31.9] 12/12/2016  . Hyperglycemia due to type 2 diabetes mellitus ([place]) [E11.65] 12/12/2016  . AKI (acute kidney injury) ([place]) [N17.9] 12/12/2016  . DKA, type 2, not at goal [facility]) [E11.10] 12/12/2016  . Obesity, Class III, BMI 40-49.9 (morbid obesity) ([place]) [E66.01]   . Type 2 diabetes mellitus without complication, without long-term current use of insulin ([place]) [E11.9] 12/10/2016  . Lithium use [Z79.899] 12/10/2016  . Moderate single current episode of major depressive disorder ([place]) [F32.1] 04/15/2016  . Pain in joint, shoulder region [M25.519] 04/14/2016  . Diverticulosis of large intestine without hemorrhage [K57.30] 11/07/2015  . Calculus of gallbladder without cholecystitis [K80.20] 11/07/2015    Total Time spent with patient: 1 hour  Subjective:   Marc Long is a 41 y.o. male patient admitted with blurred vision, dry mouth, frequent urination and high blood sugar  HPI: Patient reports history of  Bipolar manic depression, newly diagnosed Diabetes Mellitus and Morbid obesity. He presented to the hospital with elevated level of blood sugar (CBG >700). Patient reports that he was deemed stable on his Bipolar medications few months ago by his psychiatrist at [place] [facility]-Dr. Jeanette Caprice who reduced Lithium dose and weaned him off Abilify. However, shortly after that he started having suicidal thoughts on a daily basis. As a result, he went back to Dr. Jeanette Caprice who has since began to titrate up the dose of the  Lithium and Abilify. Patient states that he was stabilized on those medications as well as Lamictal in the past and does not think Abilify is responsible for his blood sugar elevation. He wants Dr. Donata Clay to be responsible for his medication management. Patient reports family history of Diabetes -Maternal Aunt, [place] grand father and mother. He denies prior history of suicide attempt. Today, he denies SI/HI, Delusions or psychosis.  Past Psychiatric History: as above  Risk to Self: Is patient at risk for suicide?: not presently. Risk to Others:  denies Prior Inpatient Therapy:   Prior Outpatient Therapy:  [facility], [place], [place]  Past Medical History:  Past Medical History:  Diagnosis Date  . Depression   . Diabetes mellitus without complication ([place])   . Gallstones   . Obesity, Class III, BMI 40-49.9 (morbid obesity) ([place])    History reviewed. No pertinent surgical history. Family History:  Family History  Problem Relation Age of Onset  . Hypertension Mother   . Depression Mother   . Heart attack Maternal Grandfather   . Lymphoma Maternal Grandmother   . Diabetes Maternal Aunt   . Cancer Maternal Aunt    Family Psychiatric  History:  Social History:  History  Alcohol Use  . 0.0 oz/week    Comment: once every 2 weeks. Wine coolers     History  Drug Use No    Social History   Social History  . Marital status: Single    Spouse name: N/A  . Number of children: 0  . Years of education: N/A   Occupational History  . call center    Social History Main Topics  . Smoking status: Never  Smoker  . Smokeless tobacco: Never Used  . Alcohol use 0.0 oz/week     Comment: once every 2 weeks. Wine coolers  . Drug use: No  . Sexual activity: Yes    Birth control/ protection: None   Other Topics Concern  . None   Social History Narrative  . None   Additional Social History:    Allergies:   Allergies  Allergen Reactions  . Bee Venom Swelling  . Keflex  [Cephalexin]   . Relafen [Nabumetone]     Blood in stool  . Shellfish Allergy Nausea Only    Labs:  Results for orders placed or performed during the hospital encounter of 12/12/16 (from the past 48 hour(s))  CBC     Status: None   Collection Time: 12/12/16  2:31 PM  Result Value Ref Range   WBC 9.8 4.0 - 10.5 K/uL   RBC 5.00 4.22 - 5.81 MIL/uL   Hemoglobin 15.0 13.0 - 17.0 g/dL   HCT 41.7 39.0 - 52.0 %   MCV 83.4 78.0 - 100.0 fL   MCH 30.0 26.0 - 34.0 pg   MCHC 36.0 30.0 - 36.0 g/dL   RDW 13.4 11.5 - 15.5 %   Platelets 284 150 - 400 K/uL  Comprehensive metabolic panel     Status: Abnormal   Collection Time: 12/12/16  2:31 PM  Result Value Ref Range   Sodium 130 (L) 135 - 145 mmol/L   Potassium 4.6 3.5 - 5.1 mmol/L   Chloride 98 (L) 101 - 111 mmol/L   CO2 15 (L) 22 - 32 mmol/L   Glucose, Bld 396 (H) 65 - 99 mg/dL   BUN 21 (H) 6 - 20 mg/dL   Creatinine, Ser 1.57 (H) 0.61 - 1.24 mg/dL   Calcium 9.1 8.9 - 10.3 mg/dL   Total Protein 7.4 6.5 - 8.1 g/dL   Albumin 4.1 3.5 - 5.0 g/dL   AST 39 15 - 41 U/L   ALT 46 17 - 63 U/L   Alkaline Phosphatase 72 38 - 126 U/L   Total Bilirubin 0.9 0.3 - 1.2 mg/dL   GFR calc non Af Amer 54 (L) >60 mL/min   GFR calc Af Amer >60 >60 mL/min    Comment: (NOTE) The eGFR has been calculated using the CKD EPI equation. This calculation has not been validated in all clinical situations. eGFR's persistently <60 mL/min signify possible Chronic Kidney Disease.    Anion gap 17 (H) 5 - 15  HIV antibody (Routine Testing)     Status: None   Collection Time: 12/12/16  3:11 PM  Result Value Ref Range   HIV Screen 4th Generation wRfx Non Reactive Non Reactive    Comment: (NOTE) Performed At: [facility] [address] [place], [place] [id number] Lindon Romp MD SE:[id number]   Lactic acid, plasma     Status: None   Collection Time: 12/12/16  3:30 PM  Result Value Ref Range   Lactic Acid, Venous 1.7 0.5 - 1.9 mmol/L  Glucose, capillary      Status: Abnormal   Collection Time: 12/12/16  4:09 PM  Result Value Ref Range   Glucose-Capillary 363 (H) 65 - 99 mg/dL  MRSA PCR Screening     Status: None   Collection Time: 12/12/16  5:16 PM  Result Value Ref Range   MRSA by PCR NEGATIVE NEGATIVE    Comment:        The GeneXpert MRSA Assay (FDA approved for NASAL specimens only), is one  component of a comprehensive MRSA colonization surveillance program. It is not intended to diagnose MRSA infection nor to guide or monitor treatment for MRSA infections.   Glucose, capillary     Status: Abnormal   Collection Time: 12/12/16  5:16 PM  Result Value Ref Range   Glucose-Capillary 258 (H) 65 - 99 mg/dL   Comment 1 Notify RN    Comment 2 Document in Chart   Glucose, capillary     Status: Abnormal   Collection Time: 12/12/16  6:23 PM  Result Value Ref Range   Glucose-Capillary 278 (H) 65 - 99 mg/dL   Comment 1 Notify RN    Comment 2 Document in Chart   Glucose, capillary     Status: Abnormal   Collection Time: 12/12/16  7:23 PM  Result Value Ref Range   Glucose-Capillary 256 (H) 65 - 99 mg/dL  Glucose, capillary     Status: Abnormal   Collection Time: 12/12/16  8:23 PM  Result Value Ref Range   Glucose-Capillary 247 (H) 65 - 99 mg/dL   Comment 1 Notify RN   Basic metabolic panel     Status: Abnormal   Collection Time: 12/12/16  8:25 PM  Result Value Ref Range   Sodium 135 135 - 145 mmol/L   Potassium 4.1 3.5 - 5.1 mmol/L   Chloride 105 101 - 111 mmol/L   CO2 19 (L) 22 - 32 mmol/L   Glucose, Bld 255 (H) 65 - 99 mg/dL   BUN 18 6 - 20 mg/dL   Creatinine, Ser 1.39 (H) 0.61 - 1.24 mg/dL   Calcium 8.6 (L) 8.9 - 10.3 mg/dL   GFR calc non Af Amer >60 >60 mL/min   GFR calc Af Amer >60 >60 mL/min    Comment: (NOTE) The eGFR has been calculated using the CKD EPI equation. This calculation has not been validated in all clinical situations. eGFR's persistently <60 mL/min signify possible Chronic Kidney Disease.    Anion  gap 11 5 - 15  Glucose, capillary     Status: Abnormal   Collection Time: 12/12/16  9:31 PM  Result Value Ref Range   Glucose-Capillary 236 (H) 65 - 99 mg/dL   Comment 1 Notify RN    Comment 2 Document in Chart   Glucose, capillary     Status: Abnormal   Collection Time: 12/12/16 10:24 PM  Result Value Ref Range   Glucose-Capillary 213 (H) 65 - 99 mg/dL   Comment 1 Notify RN    Comment 2 Document in Chart   Glucose, capillary     Status: Abnormal   Collection Time: 12/12/16 11:21 PM  Result Value Ref Range   Glucose-Capillary 192 (H) 65 - 99 mg/dL   Comment 1 Notify RN    Comment 2 Document in Chart   Basic metabolic panel     Status: Abnormal   Collection Time: 12/13/16 12:04 AM  Result Value Ref Range   Sodium 136 135 - 145 mmol/L   Potassium 3.2 (L) 3.5 - 5.1 mmol/L    Comment: DELTA CHECK NOTED   Chloride 107 101 - 111 mmol/L   CO2 20 (L) 22 - 32 mmol/L   Glucose, Bld 164 (H) 65 - 99 mg/dL   BUN 16 6 - 20 mg/dL   Creatinine, Ser 1.20 0.61 - 1.24 mg/dL   Calcium 8.5 (L) 8.9 - 10.3 mg/dL   GFR calc non Af Amer >60 >60 mL/min   GFR calc Af Amer >60 >60 mL/min  Comment: (NOTE) The eGFR has been calculated using the CKD EPI equation. This calculation has not been validated in all clinical situations. eGFR's persistently <60 mL/min signify possible Chronic Kidney Disease.    Anion gap 9 5 - 15  Glucose, capillary     Status: Abnormal   Collection Time: 12/13/16 12:15 AM  Result Value Ref Range   Glucose-Capillary 155 (H) 65 - 99 mg/dL  Glucose, capillary     Status: Abnormal   Collection Time: 12/13/16  1:31 AM  Result Value Ref Range   Glucose-Capillary 131 (H) 65 - 99 mg/dL  Glucose, capillary     Status: Abnormal   Collection Time: 12/13/16  2:33 AM  Result Value Ref Range   Glucose-Capillary 135 (H) 65 - 99 mg/dL  Glucose, capillary     Status: Abnormal   Collection Time: 12/13/16  3:25 AM  Result Value Ref Range   Glucose-Capillary 169 (H) 65 - 99 mg/dL   Basic metabolic panel     Status: Abnormal   Collection Time: 12/13/16  4:11 AM  Result Value Ref Range   Sodium 136 135 - 145 mmol/L   Potassium 3.3 (L) 3.5 - 5.1 mmol/L   Chloride 105 101 - 111 mmol/L   CO2 22 22 - 32 mmol/L   Glucose, Bld 173 (H) 65 - 99 mg/dL   BUN 14 6 - 20 mg/dL   Creatinine, Ser 1.25 (H) 0.61 - 1.24 mg/dL   Calcium 8.5 (L) 8.9 - 10.3 mg/dL   GFR calc non Af Amer >60 >60 mL/min   GFR calc Af Amer >60 >60 mL/min    Comment: (NOTE) The eGFR has been calculated using the CKD EPI equation. This calculation has not been validated in all clinical situations. eGFR's persistently <60 mL/min signify possible Chronic Kidney Disease.    Anion gap 9 5 - 15  Glucose, capillary     Status: Abnormal   Collection Time: 12/13/16  4:18 AM  Result Value Ref Range   Glucose-Capillary 174 (H) 65 - 99 mg/dL  Glucose, capillary     Status: Abnormal   Collection Time: 12/13/16  5:22 AM  Result Value Ref Range   Glucose-Capillary 194 (H) 65 - 99 mg/dL  Glucose, capillary     Status: Abnormal   Collection Time: 12/13/16  6:21 AM  Result Value Ref Range   Glucose-Capillary 186 (H) 65 - 99 mg/dL  Glucose, capillary     Status: Abnormal   Collection Time: 12/13/16  6:47 AM  Result Value Ref Range   Glucose-Capillary 176 (H) 65 - 99 mg/dL  Glucose, capillary     Status: Abnormal   Collection Time: 12/13/16  7:49 AM  Result Value Ref Range   Glucose-Capillary 193 (H) 65 - 99 mg/dL   Comment 1 Notify RN   Urinalysis, Routine w reflex microscopic     Status: Abnormal   Collection Time: 12/13/16  8:41 AM  Result Value Ref Range   Color, Urine YELLOW YELLOW   APPearance HAZY (A) CLEAR   Specific Gravity, Urine 1.024 1.005 - 1.030   pH 5.0 5.0 - 8.0   Glucose, UA >=500 (A) NEGATIVE mg/dL   Hgb urine dipstick NEGATIVE NEGATIVE   Bilirubin Urine NEGATIVE NEGATIVE   Ketones, ur 80 (A) NEGATIVE mg/dL   Protein, ur 100 (A) NEGATIVE mg/dL   Nitrite NEGATIVE NEGATIVE   Leukocytes,  UA NEGATIVE NEGATIVE   RBC / HPF 0-5 0 - 5 RBC/hpf   WBC, UA 0-5 0 -  5 WBC/hpf   Bacteria, UA NONE SEEN NONE SEEN   Squamous Epithelial / LPF 0-5 (A) NONE SEEN   Mucous PRESENT   Basic metabolic panel     Status: Abnormal   Collection Time: 12/13/16 10:58 AM  Result Value Ref Range   Sodium 133 (L) 135 - 145 mmol/L   Potassium 3.9 3.5 - 5.1 mmol/L   Chloride 104 101 - 111 mmol/L   CO2 20 (L) 22 - 32 mmol/L   Glucose, Bld 335 (H) 65 - 99 mg/dL   BUN 16 6 - 20 mg/dL   Creatinine, Ser 1.27 (H) 0.61 - 1.24 mg/dL   Calcium 8.5 (L) 8.9 - 10.3 mg/dL   GFR calc non Af Amer >60 >60 mL/min   GFR calc Af Amer >60 >60 mL/min    Comment: (NOTE) The eGFR has been calculated using the CKD EPI equation. This calculation has not been validated in all clinical situations. eGFR's persistently <60 mL/min signify possible Chronic Kidney Disease.    Anion gap 9 5 - 15  Magnesium     Status: None   Collection Time: 12/13/16 10:58 AM  Result Value Ref Range   Magnesium 2.2 1.7 - 2.4 mg/dL  Glucose, capillary     Status: Abnormal   Collection Time: 12/13/16 11:59 AM  Result Value Ref Range   Glucose-Capillary 343 (H) 65 - 99 mg/dL   Comment 1 Notify RN   Glucose, capillary     Status: Abnormal   Collection Time: 12/13/16  4:47 PM  Result Value Ref Range   Glucose-Capillary 333 (H) 65 - 99 mg/dL   Comment 1 Notify RN   Glucose, capillary     Status: Abnormal   Collection Time: 12/13/16  8:38 PM  Result Value Ref Range   Glucose-Capillary 286 (H) 65 - 99 mg/dL  Basic metabolic panel     Status: Abnormal   Collection Time: 12/14/16  7:04 AM  Result Value Ref Range   Sodium 138 135 - 145 mmol/L   Potassium 4.5 3.5 - 5.1 mmol/L   Chloride 108 101 - 111 mmol/L   CO2 21 (L) 22 - 32 mmol/L   Glucose, Bld 270 (H) 65 - 99 mg/dL   BUN 12 6 - 20 mg/dL   Creatinine, Ser 1.23 0.61 - 1.24 mg/dL   Calcium 8.4 (L) 8.9 - 10.3 mg/dL   GFR calc non Af Amer >60 >60 mL/min   GFR calc Af Amer >60 >60 mL/min     Comment: (NOTE) The eGFR has been calculated using the CKD EPI equation. This calculation has not been validated in all clinical situations. eGFR's persistently <60 mL/min signify possible Chronic Kidney Disease.    Anion gap 9 5 - 15  Glucose, capillary     Status: Abnormal   Collection Time: 12/14/16  7:30 AM  Result Value Ref Range   Glucose-Capillary 256 (H) 65 - 99 mg/dL  Glucose, capillary     Status: Abnormal   Collection Time: 12/14/16 11:20 AM  Result Value Ref Range   Glucose-Capillary 402 (H) 65 - 99 mg/dL    Current Facility-Administered Medications  Medication Dose Route Frequency Provider Last Rate Last Dose  . 0.9 % NaCl with KCl 40 mEq / L  infusion   Intravenous Continuous Vernell Leep D, MD 100 mL/hr at 12/14/16 0400 100 mL/hr at 12/14/16 0400  . acetaminophen (TYLENOL) tablet 650 mg  650 mg Oral Q6H PRN Modena Jansky, MD  Or  . acetaminophen (TYLENOL) suppository 650 mg  650 mg Rectal Q6H PRN Hongalgi, Anand D, MD      . albuterol (PROVENTIL) (2.5 MG/3ML) 0.083% nebulizer solution 2.5 mg  2.5 mg Nebulization Q2H PRN Hongalgi, Anand D, MD      . ARIPiprazole (ABILIFY) tablet 5 mg  5 mg Oral Daily Modena Jansky, MD   5 mg at 12/14/16 1110  . insulin aspart (novoLOG) injection 0-15 Units  0-15 Units Subcutaneous TID WC Modena Jansky, MD   15 Units at 12/14/16 1241  . insulin aspart (novoLOG) injection 0-5 Units  0-5 Units Subcutaneous QHS Modena Jansky, MD   3 Units at 12/13/16 2153  . insulin aspart (novoLOG) injection 4 Units  4 Units Subcutaneous TID WC Modena Jansky, MD   4 Units at 12/14/16 1240  . insulin glargine (LANTUS) injection 25 Units  25 Units Subcutaneous Daily Modena Jansky, MD   25 Units at 12/14/16 1110  . lamoTRIgine (LAMICTAL) tablet 200 mg  200 mg Oral Daily Vernell Leep D, MD   200 mg at 12/14/16 1110  . lithium carbonate (ESKALITH) CR tablet 900 mg  900 mg Oral Daily Modena Jansky, MD   900 mg at 12/14/16  1110  . LORazepam (ATIVAN) tablet 0.5 mg  0.5 mg Oral Q8H PRN Hongalgi, Anand D, MD      . multivitamin with minerals tablet 1 tablet  1 tablet Oral Daily Modena Jansky, MD   1 tablet at 12/14/16 1110  . polyethylene glycol (MIRALAX / GLYCOLAX) packet 17 g  17 g Oral Daily Modena Jansky, MD   17 g at 12/14/16 1109    Musculoskeletal: Strength & Muscle Tone: within normal limits Gait & Station: normal Patient leans: N/A  Psychiatric Specialty Exam: Physical Exam  Psychiatric: He has a normal mood and affect. His speech is normal and behavior is normal. Judgment and thought content normal. Cognition and memory are normal.    Review of Systems  Constitutional: Negative.   HENT: Negative.   Respiratory: Negative.   Cardiovascular: Negative.   Gastrointestinal: Negative.   Genitourinary: Positive for frequency and urgency.  Musculoskeletal: Negative.   Skin: Negative.   Endo/Heme/Allergies: Negative.   Psychiatric/Behavioral: Negative.     Blood pressure 131/78, pulse 91, temperature 98.4 F (36.9 C), temperature source Oral, resp. rate 18, height '5\' 11"'$  (1.803 m), weight (!) 146.1 kg (322 lb 1.5 oz), SpO2 97 %.Body mass index is 44.92 kg/m.  General Appearance: Casual  Eye Contact:  Good  Speech:  Clear and Coherent  Volume:  Normal  Mood:  Euthymic  Affect:  Appropriate  Thought Process:  Coherent and Descriptions of Associations: Intact  Orientation:  Full (Time, Place, and Person)  Thought Content:  Logical  Suicidal Thoughts:  No  Homicidal Thoughts:  No  Memory:  Immediate;   Good Recent;   Good Remote;   Good  Judgement:  Intact  Insight:  Fair  Psychomotor Activity:  Normal  Concentration:  Concentration: Good and Attention Span: Good  Recall:  Good  Fund of Knowledge:  Good  Language:  Good  Akathisia:  No  Handed:  Right  AIMS (if indicated):     Assets:  Communication Skills Desire for Improvement Social Support  ADL's:  Intact  Cognition:  WNL   Sleep:   fair     Treatment Plan Summary: Diagnosis: Bipolar affective disorder by history Plan: -Continue current medication regimen. -Refer patient to Dr.  Akers at [facility] treatment center for medication management upon discharge.  Disposition: No evidence of imminent risk to self or others at present.   Patient does not meet criteria for psychiatric inpatient admission.  Corena Pilgrim, MD 12/14/2016 1:31 PM

## 2016-12-15 DIAGNOSIS — K625 Hemorrhage of anus and rectum: Secondary | ICD-10-CM

## 2016-12-15 DIAGNOSIS — F3130 Bipolar disorder, current episode depressed, mild or moderate severity, unspecified: Secondary | ICD-10-CM | POA: Diagnosis not present

## 2016-12-15 DIAGNOSIS — N179 Acute kidney failure, unspecified: Secondary | ICD-10-CM | POA: Diagnosis not present

## 2016-12-15 DIAGNOSIS — R45851 Suicidal ideations: Secondary | ICD-10-CM | POA: Diagnosis not present

## 2016-12-15 DIAGNOSIS — E860 Dehydration: Secondary | ICD-10-CM | POA: Diagnosis not present

## 2016-12-15 DIAGNOSIS — E1110 Type 2 diabetes mellitus with ketoacidosis without coma: Secondary | ICD-10-CM | POA: Diagnosis not present

## 2016-12-15 LAB — CBC
HCT: 39.1 % (ref 39.0–52.0)
Hemoglobin: 13.3 g/dL (ref 13.0–17.0)
MCH: 28.9 pg (ref 26.0–34.0)
MCHC: 34 g/dL (ref 30.0–36.0)
MCV: 84.8 fL (ref 78.0–100.0)
PLATELETS: 243 10*3/uL (ref 150–400)
RBC: 4.61 MIL/uL (ref 4.22–5.81)
RDW: 13.8 % (ref 11.5–15.5)
WBC: 7 10*3/uL (ref 4.0–10.5)

## 2016-12-15 LAB — GLUCOSE, CAPILLARY
GLUCOSE-CAPILLARY: 285 mg/dL — AB (ref 65–99)
Glucose-Capillary: 355 mg/dL — ABNORMAL HIGH (ref 65–99)
Glucose-Capillary: 311 mg/dL — ABNORMAL HIGH (ref 65–99)

## 2016-12-15 MED ORDER — INSULIN GLARGINE 100 UNIT/ML SOLOSTAR PEN: 40.0000 [IU] | PEN_INJECTOR | Freq: Every day | SUBCUTANEOUS | 0 refills | Status: DC

## 2016-12-15 MED ORDER — INSULIN STARTER KIT- PEN NEEDLES (ENGLISH)
1.0000 | Freq: Once | Status: AC
  Administered 2016-12-15: 1
  Filled 2016-12-15: qty 1

## 2016-12-15 MED ORDER — INSULIN ASPART 100 UNIT/ML FLEXPEN: 0.0000 [IU] | PEN_INJECTOR | Freq: Three times a day (TID) | SUBCUTANEOUS | 0 refills | Status: DC

## 2016-12-15 MED ORDER — INSULIN GLARGINE 100 UNIT/ML ~~LOC~~ SOLN
35.0000 [IU] | Freq: Every day | SUBCUTANEOUS | Status: DC
  Administered 2016-12-15: 35 [IU] via SUBCUTANEOUS
  Filled 2016-12-15: qty 0.35

## 2016-12-15 MED ORDER — BLOOD GLUCOSE METER KIT: PACK | 0 refills | Status: DC

## 2016-12-15 MED ORDER — DOCUSATE SODIUM 100 MG PO CAPS: 200.0000 mg | ORAL_CAPSULE | Freq: Two times a day (BID) | ORAL | 0 refills | Status: DC

## 2016-12-15 MED ORDER — "PEN NEEDLES 3/16"" 31G X 5 MM MISC": 0 refills | Status: DC

## 2016-12-15 MED ORDER — METFORMIN HCL 500 MG PO TABS: 500.0000 mg | ORAL_TABLET | Freq: Two times a day (BID) | ORAL | Status: DC

## 2016-12-15 MED ORDER — INSULIN LISPRO 100 UNIT/ML (KWIKPEN): 0.0000 [IU] | PEN_INJECTOR | Freq: Three times a day (TID) | SUBCUTANEOUS | 0 refills | Status: DC

## 2016-12-15 MED ORDER — POLYETHYLENE GLYCOL 3350 17 G PO PACK: 17.0000 g | PACK | Freq: Every day | ORAL | 0 refills | Status: DC

## 2016-12-15 NOTE — Plan of Care (Signed)
Problem: Food- and Nutrition-Related Knowledge Deficit (NB-1.1) Goal: Nutrition education Formal process to instruct or train a patient/client in a skill or to impart knowledge to help patients/clients voluntarily manage or modify food choices and eating behavior to maintain or improve health. Outcome: Completed/Met Date Met: 12/15/16  RD consulted for nutrition education regarding diabetes.   Lab Results  Component Value Date   HGBA1C 10.8 12/10/2016    RD provided "Carbohydrate Counting for People with Diabetes" handout from the Academy of Nutrition and Dietetics. Discussed different food groups and their effects on blood sugar, emphasizing carbohydrate-containing foods. Provided list of carbohydrates and recommended serving sizes of common foods.  Discussed importance of controlled and consistent carbohydrate intake throughout the day. Provided examples of ways to balance meals/snacks and encouraged intake of high-fiber, whole grain complex carbohydrates. Teach back method used.  Expect good compliance. Pt with support from family.  Body mass index is 44.92 kg/m. Pt meets criteria for morbid obesity based on current BMI.  Current diet order is CHO mod, patient is consuming approximately 100% of meals at this time. Labs and medications reviewed. No further nutrition interventions warranted at this time. If additional nutrition issues arise, please re-consult RD.  Clayton Bibles, MS, RD, LDN Pager: [phone] After Hours Pager: [phone]

## 2016-12-15 NOTE — Consult Note (Signed)
Consultation  Referring Provider:  Dr. Algis Liming    Primary Care Physician:  [name], MD Primary Gastroenterologist: Dr. [name]       Reason for Consultation:  BRBPR, Constipation         HPI:   Marc Long is a 41 y.o. male with a past medical history of depression and obesity, who originally presented to the ED on 12/12/16 for a complaint of frequent urination, thirst, dry mouth and blurred vision with high blood sugars. He was a direct admission from his PCP due to a blood sugar found to be 743 on 12/10/16 labs. He was diagnosed with DKA, Dehydration, AKI, morbid obesity and Bipolar disorder with suicidal ideations. We are consulted today in regards to the fact that the patient had one episode of BRBPR in the setting of constipation on 12/13/16.   Today, the patient explains that he has had some scant brb on the toilet paper after wiping very occasionally in the past and typically this is associated with times of constipation or "hard balls of stool". Patient tells me he has MiraLAX at home and he uses this occasionally, maybe once a month or so. He explains that he had not had a bowel movement since Wednesday of last week, 12/10/16 and had his first bowel movement here in the hospital was on Saturday 12/13/16. He does tell me that this was "hard balls of stool"and "one was larger than the others", and when wiping he then saw some bright red blood on the toilet paper. The patient has not had a bowel movement since then, but did see some "very scant" amount of bright red blood on the towel after taking a shower today. He has seen no more blood in the toilet. He denies any abdominal or rectal pain.   Patient reminds me that he did have a colonoscopy as below with Dr. [name] and he was told this was normal other than diverticula. He has no family history of colon cancer.   Patient denies fever, chills, melena, dizziness or syncope.   Past GI History: 10/08/26- Colonoscopy, Dr. [name]: Impression:  examined ileum was normal, Diverticulosis of the entire examined colon, otherwise normal; Recommendations: BID antispasmodic, repeat colo at 51 10/03/15- OV, Dr. [name]:  Seen for LLQ abdominal pain and loose stool; Recommendations:Colonoscopy 08/2015- CT Abd and Pelvis with contrast: Cholelithiasis, Colon diverticulosis without acute diverticulitis- patient was started on Cipro and Flagyl  Past Medical History:  Diagnosis Date  . Depression   . Diabetes mellitus without complication ([place])   . Gallstones   . Obesity, Class III, BMI 40-49.9 (morbid obesity) ([place])     History reviewed. No pertinent surgical history.  Family History  Problem Relation Age of Onset  . Hypertension Mother   . Depression Mother   . Heart attack Maternal Grandfather   . Lymphoma Maternal Grandmother   . Diabetes Maternal Aunt   . Cancer Maternal Aunt     Social History  Substance Use Topics  . Smoking status: Never Smoker  . Smokeless tobacco: Never Used  . Alcohol use 0.0 oz/week     Comment: once every 2 weeks. Wine coolers    Prior to Admission medications   Medication Sig Start Date End Date Taking? Authorizing Provider  ARIPiprazole (ABILIFY) 5 MG tablet TK 1 T PO QD 11/25/16  Yes [provider]  lamoTRIgine (LAMICTAL) 200 MG tablet TK 1 T PO QD 11/25/16  Yes [provider]  lithium carbonate (LITHOBID) 300  MG CR tablet Take 3 tablets by mouth daily 11/25/16  Yes [provider]  LORazepam (ATIVAN) 0.5 MG tablet Take 0.5 mg by mouth every 8 (eight) hours as needed for anxiety.    Yes [provider]  Multiple Vitamins-Minerals (MULTIVITAMIN WITH MINERALS) tablet Take 1 tablet by mouth daily.   Yes [provider]  metFORMIN (GLUCOPHAGE) 500 MG tablet Take 1 tablet (500 mg total) by mouth 2 (two) times daily with a meal. Wk1: 1/2 tab 2x/day; W 2: 1 tab in am, 1/2 tab pm; Wk3: 2 tabs 2x/day. Patient not taking: Reported on 12/12/2016 12/10/16   McVey,  Gelene Mink, PA-C    Current Facility-Administered Medications  Medication Dose Route Frequency Provider Last Rate Last Dose  . acetaminophen (TYLENOL) tablet 650 mg  650 mg Oral Q6H PRN Hongalgi, Lenis Dickinson, MD       Or  . acetaminophen (TYLENOL) suppository 650 mg  650 mg Rectal Q6H PRN Hongalgi, Anand D, MD      . albuterol (PROVENTIL) (2.5 MG/3ML) 0.083% nebulizer solution 2.5 mg  2.5 mg Nebulization Q2H PRN Hongalgi, Anand D, MD      . ARIPiprazole (ABILIFY) tablet 5 mg  5 mg Oral Daily Modena Jansky, MD   5 mg at 12/14/16 1110  . docusate sodium (COLACE) capsule 200 mg  200 mg Oral BID Modena Jansky, MD   200 mg at 12/14/16 2137  . insulin aspart (novoLOG) injection 0-15 Units  0-15 Units Subcutaneous TID WC Modena Jansky, MD   5 Units at 12/14/16 1759  . insulin aspart (novoLOG) injection 0-5 Units  0-5 Units Subcutaneous QHS Modena Jansky, MD   5 Units at 12/14/16 2137  . insulin aspart (novoLOG) injection 6 Units  6 Units Subcutaneous TID WC Modena Jansky, MD   6 Units at 12/14/16 1758  . insulin glargine (LANTUS) injection 35 Units  35 Units Subcutaneous Daily Hongalgi, Anand D, MD      . lamoTRIgine (LAMICTAL) tablet 200 mg  200 mg Oral Daily Modena Jansky, MD   200 mg at 12/14/16 1110  . lithium carbonate (ESKALITH) CR tablet 900 mg  900 mg Oral Daily Modena Jansky, MD   900 mg at 12/14/16 1110  . LORazepam (ATIVAN) tablet 0.5 mg  0.5 mg Oral Q8H PRN Hongalgi, Anand D, MD      . multivitamin with minerals tablet 1 tablet  1 tablet Oral Daily Modena Jansky, MD   1 tablet at 12/14/16 1110  . polyethylene glycol (MIRALAX / GLYCOLAX) packet 17 g  17 g Oral Daily Modena Jansky, MD   17 g at 12/14/16 1109    Allergies as of 12/12/2016 - Review Complete 12/12/2016  Allergen Reaction Noted  . Bee venom Swelling 04/14/2016  . Keflex [cephalexin]  09/20/2012  . Relafen [nabumetone]  08/25/2015  . Shellfish allergy Nausea Only 04/14/2016      Review of Systems:    Constitutional: No weight loss, fever or chills Skin: No rash  Cardiovascular: No chest pain Respiratory: No SOB  Gastrointestinal: See HPI and otherwise negative Genitourinary: Change in urine stream, "It sprays everywhere" Neurological: No headache Musculoskeletal: No new muscle or joint pain Hematologic: No bruising Psychiatric: History of bipolar disorder   Physical Exam:  Vital signs in last 24 hours: Temp:  [97.7 F (36.5 C)-99.1 F (37.3 C)] 97.7 F (36.5 C) (07/02 0528) Pulse Rate:  [75-95] 75 (07/02 0528) Resp:  [18] 18 (07/02 0528)  BP: (121-135)/(78-99) 126/82 (07/02 0617) SpO2:  [97 %-99 %] 98 % (07/02 0528) Last BM Date: 12/14/16 General:   Pleasant Obese  African American male appears to be in NAD, Well developed, Well nourished, alert and cooperative Head:  Normocephalic and atraumatic. Eyes:   PEERL, EOMI. No icterus. Conjunctiva pink. Ears:  Normal auditory acuity. Neck:  Supple Throat: Oral cavity and pharynx without inflammation, swelling or lesion. Teeth in good condition. Lungs: Respirations even and unlabored. Lungs clear to auscultation bilaterally.   No wheezes, crackles, or rhonchi.  Heart: Normal S1, S2. No MRG. Regular rate and rhythm. No peripheral edema, cyanosis or pallor.  Abdomen:  Soft, nondistended, nontender. No rebound or guarding. Normal bowel sounds. No appreciable masses or hepatomegaly. Rectal:  External: No hemorrhoids, tags or fissure; Internal: Rectal fullness, no ttp, brown stool residue with no blood Msk:  Symmetrical without gross deformities. Peripheral pulses intact.  Extremities:  Without edema, no deformity or joint abnormality. Neurologic:  Alert and  oriented x4;  grossly normal neurologically.  Skin:   Dry and intact without significant lesions or rashes. Psychiatric:  Demonstrates good judgement and reason without abnormal affect or behaviors.   LAB RESULTS:  Recent Labs  12/12/16 1431  12/14/16 1707 12/15/16 0538  WBC 9.8 6.6 7.0  HGB 15.0 13.1 13.3  HCT 41.7 37.6* 39.1  PLT 284 235 243   BMET  Recent Labs  12/13/16 0411 12/13/16 1058 12/14/16 0704  NA 136 133* 138  K 3.3* 3.9 4.5  CL 105 104 108  CO2 22 20* 21*  GLUCOSE 173* 335* 270*  BUN 14 16 12   CREATININE 1.25* 1.27* 1.23  CALCIUM 8.5* 8.5* 8.4*   LFT  Recent Labs  12/12/16 1431  PROT 7.4  ALBUMIN 4.1  AST 39  ALT 46  ALKPHOS 72  BILITOT 0.9    PREVIOUS ENDOSCOPIES:            See HPI   Impression / Plan:   Impression: 1. Rectal bleeding:Patient had 1 episode of rectal bleeding with constipated stool on 12/13/16, he has not had a bowel movement since then, did see a small amount of bright red blood on the towel after taking a shower today; this most likely represents internal hemorrhoids, external exam was normal today, no bleeding at time of exam today, hemoglobin has remained stable, colonoscopy last year show diverticula and was otherwise normal 2. DKA type II 3. AKI 4. Morbid obesity  Plan: 1. At this time no need for endoscopic procedures, especially since the patient had one last year with Dr. [name] which was normal. Did discuss that if the patient sees any further bleeding when he returns home or has an increase in bleeding or starts having abdominal or rectal pain, recommend that he call our office for a visit. 2. Recommend the patient continue his daily stool softeners and start MiraLAX at least once daily. He does have this at home. 3. From a GI standpoint this patient is stable to be discharged. 4. Did discuss this case with Dr. [name], he will review and make any further recommendations.  Thank you for your kind consultation, we will continue to follow.  Lavone Nian Lemmon  12/15/2016, 9:10 AM Pager #: [phone]   ________________________________________________________________________  Velora Heckler GI MD note:  I personally examined the patient, reviewed the data and  agree with the assessment and plan described above.  Very minor rectal bleeding in setting of constipation, lovenox.  No hemorrhoids on exam but I suspect  benign anorectal process such as hemorrhoids (can be intermittent) given colonoscopy just about a year ago.  The bleeding has not recurred.  No need for GI testing, he is safe for d/c from my standpoint when the DM is under control.    Please call or page with any further questions or concerns.     Owens Loffler, MD [facility] Pager [phone]

## 2016-12-15 NOTE — Discharge Instructions (Signed)
Fingerstick blood sugar goals for home:  Before meals 80-130 mg/dl and 2-hours after meals less than 180 mg/dl  Call MD if you have sugars >300 mg/dl all day.  Treat low blood sugars (less than 80 mg/dl with symptoms) with 1/2 cup fruit juice or regular soda- recheck blood sugar in 15 minutes to make sure blood sugar has come up   Additional discharge instructions:  Please get your medications reviewed and adjusted by your Primary MD.  Please request your Primary MD to go over all Hospital Tests and Procedure/Radiological results at the follow up, please get all Hospital records sent to your Prim MD by signing hospital release before you go home.  If you had Pneumonia of Lung problems at the Hospital: Please get a 2 view Chest X ray done in 6-8 weeks after hospital discharge or sooner if instructed by your Primary MD.  If you have Congestive Heart Failure: Please call your Cardiologist or Primary MD anytime you have any of the following symptoms:  1) 3 pound weight gain in 24 hours or 5 pounds in 1 week  2) shortness of breath, with or without a dry hacking cough  3) swelling in the hands, feet or stomach  4) if you have to sleep on extra pillows at night in order to breathe  Follow cardiac low salt diet and 1.5 lit/day fluid restriction.  If you have diabetes Accuchecks 4 times/day, Once in AM empty stomach and then before each meal. Log in all results and show them to your primary doctor at your next visit. If any glucose reading is under 80 or above 300 call your primary MD immediately.  If you have Seizure/Convulsions/Epilepsy: Please do not drive, operate heavy machinery, participate in activities at heights or participate in high speed sports until you have seen by Primary MD or a Neurologist and advised to do so again.  If you had Gastrointestinal Bleeding: Please ask your Primary MD to check a complete blood count within one week of discharge or at your next visit. Your  endoscopic/colonoscopic biopsies that are pending at the time of discharge, will also need to followed by your Primary MD.  Get Medicines reviewed and adjusted. Please take all your medications with you for your next visit with your Primary MD  Please request your Primary MD to go over all hospital tests and procedure/radiological results at the follow up, please ask your Primary MD to get all Hospital records sent to his/her office.  If you experience worsening of your admission symptoms, develop shortness of breath, life threatening emergency, suicidal or homicidal thoughts you must seek medical attention immediately by calling 911 or calling your MD immediately  if symptoms less severe.  You must read complete instructions/literature along with all the possible adverse reactions/side effects for all the Medicines you take and that have been prescribed to you. Take any new Medicines after you have completely understood and accpet all the possible adverse reactions/side effects.   Do not drive or operate heavy machinery when taking Pain medications.   Do not take more than prescribed Pain, Sleep and Anxiety Medications  Special Instructions: If you have smoked or chewed Tobacco  in the last 2 yrs please stop smoking, stop any regular Alcohol  and or any Recreational drug use.  Wear Seat belts while driving.  Please note You were cared for by a hospitalist during your hospital stay. If you have any questions about your discharge medications or the care you received while you  were in the hospital after you are discharged, you can call the unit and asked to speak with the hospitalist on call if the hospitalist that took care of you is not available. Once you are discharged, your primary care physician will handle any further medical issues. Please note that NO REFILLS for any discharge medications will be authorized once you are discharged, as it is imperative that you return to your primary care  physician (or establish a relationship with a primary care physician if you do not have one) for your aftercare needs so that they can reassess your need for medications and monitor your lab values.  You can reach the hospitalist office at phone [phone] or fax [phone]   If you do not have a primary care physician, you can call [phone] for a physician referral.

## 2016-12-15 NOTE — Discharge Summary (Signed)
Physician Discharge Summary  Marc Long DOB: May 21, 1976  PCP: [name], MD  Admit date: 12/12/2016 Discharge date: 12/15/2016  Recommendations for Outpatient Follow-up:  1. Dr. [name], PCP on 12/18/16 at 3:40 PM. 2. Dr. Sharlynn Oliphant, Psychiatry in 1 week. Patient advised to discuss regarding changing or reducing the dose of Abilify which may contribute to uncontrolled diabetes.  Home Health: None Equipment/Devices: None    Discharge Condition: Improved and stable  CODE STATUS: Full  Diet recommendation: Heart healthy & diabetic diet.  Discharge Diagnoses:  Principal Problem:   DKA, type 2, not at goal [facility]) Active Problems:   Lithium use   DM (diabetes mellitus), type 2, uncontrolled ([place])   Dehydration   Suicidal ideations   Obesity, Class III, BMI 40-49.9 (morbid obesity) (HCC)   Bipolar disorder (manic depression) (HCC)   Hyperglycemia due to type 2 diabetes mellitus (HCC)   AKI (acute kidney injury) ([place])   Brief Summary: 41 year old male, PMH of newly diagnosed DM (diagnosed earlier the week of admission), bipolar disorder, morbid obesity, recently seen in ED for hyperglycemia (CBG >700), admitted from PCPs office for DKA, dehydration, acute kidney injury and ongoing suicidal ideations.    Assessment & Plan:   1. DKA type II, not at goal: A1c 10.8. Recently started on metformin and has taken only one dose prior to admission. Admitted to stepdown. Treated per protocol with aggressive IV fluids, insulin drip, frequent BMP monitoring. DKA resolved and transitioned to Lantus and SSI. Extensive diabetes education provided by multiple providers including MD, nursing and diabetes coordinator. Lantus and NovoLog were adjusted during the course of hospitalization. CBGs are better but not optimal. Patient will be discharged on insulin's and doses as indicated below but will need further adjustment as outpatient. Advised to continue metformin. He had initially been  discharged on NovoLog flex pen but his pharmacy advised that this would not be covered by his insurance. I personally called the pharmacist at his pharmacy and changed this to [place] which is covered by his insurance. Patient has a close outpatient follow-up appointment with his medications can further be adjusted. 2. Dehydration with hyponatremia: resolved. 3. Acute kidney injury: Acute kidney injury resolved. Baseline creatinine probably in the 1.2 range. 4. Bipolar disorder with suicidal ideations: Placed on suicide precautions, safety sitter and psychiatry was consulted who saw him and indicated that there is no evidence of imminent risk to himself or others, does not meet criteria for psychiatric inpatient admission and recommended patient follow-up with his primary psychiatrist for medication management. DC suicide precautions and sitter. I discussed with patient and advised him to follow up with his psychiatrist regarding changes to his Abilify. He verbalized understanding. 5. Morbid obesity/Body mass index is 44.92 kg/m.  patient advised to eat healthy, exercise and lose weight. 6. Hypokalemia: Replaced 7. Rectal bleeding 1: As per RN, on day prior to discharge, patient had an episode of bright red blood in toilet bowl associated with hard stools but no rectal pain or abdominal pain. No recurrence of same. [facility] consulted. They indicated that he had very minor rectal bleeding in the setting of constipation. No hemorrhoids on exam but suspect benign anorectal process such as hemorrhoids given colonoscopy just about a year ago. Colonoscopy at that time had showed diverticula and was otherwise normal. GI cleared him for discharge and recommended daily stool softeners and MiraLAX. He was advised to seek immediate medical attention for recurrent rectal bleeding or worsening.  Consultants:  Psychiatry [facility]  Procedures:  None   Discharge Instructions  Discharge  Instructions    Ambulatory referral to Nutrition and Diabetic Education    Complete by:  As directed    New-onset DM   Call MD for:  extreme fatigue    Complete by:  As directed    Call MD for:  persistant dizziness or light-headedness    Complete by:  As directed    Diet - low sodium heart healthy    Complete by:  As directed    Diet Carb Modified    Complete by:  As directed    Increase activity slowly    Complete by:  As directed        Medication List    TAKE these medications   ARIPiprazole 5 MG tablet Commonly known as:  ABILIFY TK 1 T PO QD   blood glucose meter kit and supplies Dispense based on patient and insurance preference. Use up to four times daily as directed. (FOR ICD-9 250.00, 250.01).   docusate sodium 100 MG capsule Commonly known as:  COLACE Take 2 capsules (200 mg total) by mouth 2 (two) times daily.   Insulin Glargine 100 UNIT/ML Solostar Pen Commonly known as:  LANTUS SOLOSTAR Inject 40 Units into the skin daily. Start taking on:  12/16/2016   insulin lispro 100 UNIT/ML KiwkPen Commonly known as:  HUMALOG KWIKPEN Inject 0-0.15 mLs (0-15 Units total) into the skin 3 (three) times daily with meals. CBG < 70: eat or drink something sweet and recheck. CBG 70 - 120: 0 units CBG 121 - 150: 2 units CBG 151 - 200: 3 units CBG 201 - 250: 5 units CBG 251 - 300: 8 units CBG 301 - 350: 11 units CBG 351 - 400: 15 units CBG > 400: call MD.   lamoTRIgine 200 MG tablet Commonly known as:  LAMICTAL TK 1 T PO QD   lithium carbonate 300 MG CR tablet Commonly known as:  LITHOBID Take 3 tablets by mouth daily   LORazepam 0.5 MG tablet Commonly known as:  ATIVAN Take 0.5 mg by mouth every 8 (eight) hours as needed for anxiety.   metFORMIN 500 MG tablet Commonly known as:  GLUCOPHAGE Take 1 tablet (500 mg total) by mouth 2 (two) times daily with a meal. What changed:  additional instructions   multivitamin with minerals tablet Take 1 tablet by mouth daily.    Pen Needles 3/16" 31G X 5 MM Misc Use as per instructions 3 times a day.   polyethylene glycol packet Commonly known as:  MIRALAX / GLYCOLAX Take 17 g by mouth daily. Start taking on:  12/16/2016      Follow-up Information    [name], MD Follow up.   Specialty:  Family Medicine Why:  appt on 12/18/2016 at 3:40 pm , please arrive to your appointment 10-15 minutes prior. your location will be at [address] information: [place] [address] [phone]        Dr. Tilford Pillar. Schedule an appointment as soon as possible for a visit in 1 week(s).   Why:  Please discuss regarding changing or reducing the dose of your Abilify which may contribute to uncontrolled diabetes.         Allergies  Allergen Reactions  . Bee Venom Swelling  . Keflex [Cephalexin]   . Relafen [Nabumetone]     Blood in stool  . Shellfish Allergy Nausea Only      Procedures/Studies: No results found.  Subjective: Denied complaints. No further rectal bleeding. Reported constipation and hard stools during episode of rectal bleed yesterday. No rectal or abdominal pain. Reports colonoscopy may be a year ago. Denies any other complaints. No suicidal or homicidal ideations reported.  Discharge Exam:  Vitals:   12/14/16 2116 12/15/16 0528 12/15/16 0617 12/15/16 1444  BP: 135/81 (!) 121/99 126/82 130/79  Pulse: 95 75  95  Resp: '18 18  18  '$ Temp: 99.1 F (37.3 C) 97.7 F (36.5 C)  98.4 F (36.9 C)  TempSrc: Oral Oral  Oral  SpO2: 99% 98%  99%  Weight:      Height:        General exam: Young male, moderately built and obese, seen ambulating comfortably in the hall. Respiratory system: Clear to auscultation. Respiratory effort normal. Cardiovascular system: S1 & S2 heard, RRR. No JVD, murmurs, rubs, gallops or clicks. No pedal edema.  Gastrointestinal system: Abdomen is nondistended, soft and nontender. No organomegaly or masses felt. Normal bowel  sounds heard.  Central nervous system: Alert and oriented. No focal neurological deficits. Extremities: Symmetric 5 x 5 power. Skin: No rashes, lesions or ulcers Psychiatry: Judgement and insight appear normal. Mood & affect flat.     The results of significant diagnostics from this hospitalization (including imaging, microbiology, ancillary and laboratory) are listed below for reference.     Microbiology: Recent Results (from the past 240 hour(s))  Urine Culture     Status: None   Collection Time: 12/10/16  4:36 PM  Result Value Ref Range Status   Urine Culture, Routine Final report  Final   Organism ID, Bacteria Comment  Final    Comment: Mixed urogenital flora 25,000-50,000 colony forming units per mL   MRSA PCR Screening     Status: None   Collection Time: 12/12/16  5:16 PM  Result Value Ref Range Status   MRSA by PCR NEGATIVE NEGATIVE Final    Comment:        The GeneXpert MRSA Assay (FDA approved for NASAL specimens only), is one component of a comprehensive MRSA colonization surveillance program. It is not intended to diagnose MRSA infection nor to guide or monitor treatment for MRSA infections.      Labs: CBC:  Recent Labs Lab 12/10/16 1703 12/11/16 1456 12/12/16 1431 12/14/16 1707 12/15/16 0538  WBC 11.7* 12.2* 9.8 6.6 7.0  HGB 16.1 16.6 15.0 13.1 13.3  HCT 44.2 46.0 41.7 37.6* 39.1  MCV 84.1 82.4 83.4 83.9 84.8  PLT  --  330 284 235 818   Basic Metabolic Panel:  Recent Labs Lab 12/12/16 2025 12/13/16 0004 12/13/16 0411 12/13/16 1058 12/14/16 0704  NA 135 136 136 133* 138  K 4.1 3.2* 3.3* 3.9 4.5  CL 105 107 105 104 108  CO2 19* 20* 22 20* 21*  GLUCOSE 255* 164* 173* 335* 270*  BUN '18 16 14 16 12  '$ CREATININE 1.39* 1.20 1.25* 1.27* 1.23  CALCIUM 8.6* 8.5* 8.5* 8.5* 8.4*  MG  --   --   --  2.2  --    Liver Function Tests:  Recent Labs Lab 12/10/16 1636 12/11/16 1456 12/12/16 1431  AST 32 40 39  ALT 44 45 46  ALKPHOS 141* 91 72   BILITOT 0.3 1.2 0.9  PROT 8.2 8.3* 7.4  ALBUMIN 5.0 4.6 4.1   CBG:  Recent Labs Lab 12/14/16 1629 12/14/16 2125 12/15/16 0704 12/15/16 1202 12/15/16 1522  GLUCAP 233* 364* 285* 355* 311*   Urinalysis  Component Value Date/Time   COLORURINE YELLOW 12/13/2016 0841   APPEARANCEUR HAZY (A) 12/13/2016 0841   LABSPEC 1.024 12/13/2016 0841   PHURINE 5.0 12/13/2016 0841   GLUCOSEU >=500 (A) 12/13/2016 0841   HGBUR NEGATIVE 12/13/2016 0841   BILIRUBINUR NEGATIVE 12/13/2016 0841   BILIRUBINUR negative 12/12/2016 1205   KETONESUR 80 (A) 12/13/2016 0841   PROTEINUR 100 (A) 12/13/2016 0841   UROBILINOGEN 0.2 12/12/2016 1205   UROBILINOGEN 0.2 02/16/2016 1840   NITRITE NEGATIVE 12/13/2016 0841   LEUKOCYTESUR NEGATIVE 12/13/2016 0841      Time coordinating discharge: Over 30 minutes  SIGNED:  Vernell Leep, MD, FACP, FHM. [facility] Pager [phone]  If 7PM-7AM, please contact night-coverage [email or web address] Password [facility] 12/15/2016, 6:48 PM

## 2016-12-15 NOTE — Progress Notes (Signed)
Inpatient Diabetes Program Recommendations  AACE/ADA: New Consensus Statement on Inpatient Glycemic Control (2015)  Target Ranges:  Prepandial:   less than 140 mg/dL      Peak postprandial:   less than 180 mg/dL (1-2 hours)      Critically ill patients:  140 - 180 mg/dL   Results for MATTIAS, WALMSLEY (MRN [id number]) as of 12/15/2016 12:45  Ref. Range 12/14/2016 07:30 12/14/2016 11:20 12/14/2016 16:29 12/14/2016 21:25  Glucose-Capillary Latest Ref Range: 65 - 99 mg/dL 256 (H) 402 (H) 233 (H) 364 (H)   Results for ARIN, PERAL (MRN [id number]) as of 12/15/2016 12:45  Ref. Range 12/15/2016 07:04 12/15/2016 12:02  Glucose-Capillary Latest Ref Range: 65 - 99 mg/dL 285 (H) 355 (H)    Admit with: New diagnosis of DM  History: Bipolar disorder, Morbid obesity  Home DM Meds: Metformin 500 mg BID (just started one day prior to admission)  Current Insulin Orders: Lantus 35 units daily       Novolog Moderate Correction Scale/ SSI (0-15 units) TID AC + HS      Novolog 6 units TID with meals     Spoke with pt and pt's mother about new diagnosis.  Discussed A1C results with them and explained what an A1C is, basic pathophysiology of DM Type 2, basic home care, basic diabetes diet nutrition principles, importance of checking CBGs and maintaining good CBG control to prevent long-term and short-term complications.  Reviewed signs and symptoms of hyperglycemia and hypoglycemia and how to treat hyperglycemia and hypoglycemia at home.  Also reviewed blood sugar goals and A1c goals for home.    RNs to provide ongoing basic DM education at bedside with this patient.  Have ordered educational booklet, insulin starter kit, and DM videos.  Have also placed RD consult for DM diet education for this patient.  Educated patient on insulin pen use at home.  Reviewed all steps of insulin pen including attachment of needle, 2-unit air shot, dialing up dose, giving injection, removing needle, disposal of sharps, storage of unused  insulin, disposal of insulin etc.  Patient able to provide successful return demonstration.  Reviewed troubleshooting with insulin pen.  Have asked RNs caring for patient to please allow patient to give all injections here in hospital as much as possible for practice.      MD- Patient unsure whether he wants to use insulin pens or vial and syringe at home.  Please make sure to ask him which he prefers before entering Rxs for discharge.  Please also consider the following insulin adjustments:  Increase Lantus to 45 units daily (~0.3 units/kg dosing based on weight of 146 kg)  Increase Novolog SSI to Resistant scale (0-20 units) TID AC + HS    For discharge, please ask pt if he wants vial and syringe or insulin pens:  Lantus insulin pen- Order # [id number]  Novolog insulin pen- Order # [id number]  Insulin Pen needles- Order # [id number]  CBG Meter and supplies- Order # [id number]  OR  Lantus vial- Order# [id number]  Novolog vial- Order # [id number]  Insulin Syringes- Order # [id number]    --Will follow patient during hospitalization--  Wyn Quaker RN, MSN, CDE Diabetes Coordinator Inpatient Glycemic Control Team Team Pager: [phone] (8a-5p)

## 2016-12-15 NOTE — Care Management Note (Signed)
Case Management Note  Patient Details  Name: Marc Long MRN: [id number] Date of Birth: July 02, 1975  Subjective/Objective:       DKA,  Lithium Use, DM, SI, AKI            Action/Plan: Discharge Planning: NCM spoke to pt and states he see Dr Brigitte Pulse at [facility]. Appt scheduled for 12/18/2016 at 3:40 pm. Pt states he can make the appt. Provided pt with Lantus and Novolog Copay Savings Cards. Lantus will be $0 copay. And Novolog $25 copay. Mother at home to assist with care as needed. Pt states he does work full-time.   PCP [name] MD  Expected Discharge Date:  12/15/16               Expected Discharge Plan:  Home/Self Care  In-House Referral:  NA  Discharge planning Services  CM Consult, Follow-up appt scheduled, Medication Assistance  Post Acute Care Choice:  NA Choice offered to:  NA  DME Arranged:  N/A DME Agency:  NA  HH Arranged:  NA HH Agency:  NA  Status of Service:  Completed, signed off  If discussed at [place] of Stay Meetings, dates discussed:    Additional Comments:  Marc Rasher, RN 12/15/2016, 5:12 PM

## 2016-12-18 ENCOUNTER — Encounter: Payer: Self-pay | Admitting: Family Medicine

## 2016-12-18 ENCOUNTER — Ambulatory Visit (INDEPENDENT_AMBULATORY_CARE_PROVIDER_SITE_OTHER): Payer: Managed Care, Other (non HMO) | Admitting: Family Medicine

## 2016-12-18 VITALS — BP 141/88 | HR 83 | Temp 98.1°F | Resp 18 | Ht 71.0 in | Wt 329.0 lb

## 2016-12-18 DIAGNOSIS — F321 Major depressive disorder, single episode, moderate: Secondary | ICD-10-CM | POA: Diagnosis not present

## 2016-12-18 DIAGNOSIS — E1165 Type 2 diabetes mellitus with hyperglycemia: Secondary | ICD-10-CM | POA: Diagnosis not present

## 2016-12-18 DIAGNOSIS — Z794 Long term (current) use of insulin: Secondary | ICD-10-CM

## 2016-12-18 DIAGNOSIS — N179 Acute kidney failure, unspecified: Secondary | ICD-10-CM | POA: Diagnosis not present

## 2016-12-18 DIAGNOSIS — E119 Type 2 diabetes mellitus without complications: Secondary | ICD-10-CM

## 2016-12-18 DIAGNOSIS — E876 Hypokalemia: Secondary | ICD-10-CM

## 2016-12-18 DIAGNOSIS — R030 Elevated blood-pressure reading, without diagnosis of hypertension: Secondary | ICD-10-CM

## 2016-12-18 LAB — POCT URINALYSIS DIP (MANUAL ENTRY)
Bilirubin, UA: NEGATIVE
Ketones, POC UA: NEGATIVE mg/dL
Leukocytes, UA: NEGATIVE
NITRITE UA: NEGATIVE
PH UA: 5.5 (ref 5.0–8.0)
PROTEIN UA: NEGATIVE mg/dL
RBC UA: NEGATIVE
SPEC GRAV UA: 1.01 (ref 1.010–1.025)
UROBILINOGEN UA: 0.2 U/dL

## 2016-12-18 LAB — GLUCOSE, POCT (MANUAL RESULT ENTRY): POC Glucose: 341 mg/dl — AB (ref 70–99)

## 2016-12-18 MED ORDER — INSULIN GLARGINE 100 UNIT/ML SOLOSTAR PEN
50.0000 [IU] | PEN_INJECTOR | Freq: Every day | SUBCUTANEOUS | 2 refills | Status: DC
Start: 2016-12-18 — End: 2017-02-05

## 2016-12-18 MED ORDER — METFORMIN HCL 1000 MG PO TABS: 1000.0000 mg | ORAL_TABLET | Freq: Two times a day (BID) | ORAL | 1 refills | Status: DC

## 2016-12-18 MED ORDER — "PEN NEEDLES 3/16"" 31G X 5 MM MISC": 11 refills | Status: DC

## 2016-12-18 MED ORDER — LOSARTAN POTASSIUM 25 MG PO TABS: 25.0000 mg | ORAL_TABLET | Freq: Every day | ORAL | 0 refills | Status: DC

## 2016-12-18 MED ORDER — INSULIN LISPRO 100 UNIT/ML (KWIKPEN): 0.0000 [IU] | PEN_INJECTOR | Freq: Three times a day (TID) | SUBCUTANEOUS | 2 refills | Status: DC

## 2016-12-18 NOTE — Progress Notes (Signed)
Subjective:    Patient ID: Marc Long, male    DOB: [birth year], 41 y.o.   MRN: [id number]  Chief Complaint  Patient presents with  . Diabetes    states he has no issues  . Follow-up    HPI  Family history contributing.   He is feeling fine now. The wk prior to his hospitalization he had had a rough week. He had urinary freq every 30-45 min and very thirsty and vision bad.   He started the abilify and other meds after November.   12:46 272  He is checking cbgs around 7 am before breakfast Then around noon he checks cbgs between 11-1  Before lunch has had to use 5-11u Then in evening around 6-8 pm before dinner   Doing lantus 40u qam. Tolerating metformin well.  No hypoglycemia.  Lowest he has seen is 22   Mother thinks lisinopril is made of snake venom.    Past Medical History:  Diagnosis Date  . Depression   . Diabetes mellitus without complication ([place])   . Gallstones   . Obesity, Class III, BMI 40-49.9 (morbid obesity) ([place])    No past surgical history on file. Current Outpatient Prescriptions on File Prior to Visit  Medication Sig Dispense Refill  . ARIPiprazole (ABILIFY) 5 MG tablet TK 1 T PO QD  1  . blood glucose meter kit and supplies Dispense based on patient and insurance preference. Use up to four times daily as directed. (FOR ICD-9 250.00, 250.01). 1 each 0  . docusate sodium (COLACE) 100 MG capsule Take 2 capsules (200 mg total) by mouth 2 (two) times daily. 120 capsule 0  . lamoTRIgine (LAMICTAL) 200 MG tablet TK 1 T PO QD  1  . lithium carbonate (LITHOBID) 300 MG CR tablet Take 3 tablets by mouth daily  1  . LORazepam (ATIVAN) 0.5 MG tablet Take 0.5 mg by mouth every 8 (eight) hours as needed for anxiety.     . Multiple Vitamins-Minerals (MULTIVITAMIN WITH MINERALS) tablet Take 1 tablet by mouth daily.    . polyethylene glycol (MIRALAX / GLYCOLAX) packet Take 17 g by mouth daily. 14 each 0   No current facility-administered medications on file prior  to visit.    Allergies  Allergen Reactions  . Bee Venom Swelling  . Keflex [Cephalexin]   . Relafen [Nabumetone]     Blood in stool  . Shellfish Allergy Nausea Only   Family History  Problem Relation Age of Onset  . Hypertension Mother   . Depression Mother   . Heart attack Maternal Grandfather   . Lymphoma Maternal Grandmother   . Diabetes Maternal Aunt   . Cancer Maternal Aunt    Social History   Social History  . Marital status: Single    Spouse name: N/A  . Number of children: 0  . Years of education: N/A   Occupational History  . call center    Social History Main Topics  . Smoking status: Never Smoker  . Smokeless tobacco: Never Used  . Alcohol use 0.0 oz/week     Comment: once every 2 weeks. Wine coolers  . Drug use: No  . Sexual activity: Yes    Birth control/ protection: None   Other Topics Concern  . None   Social History Narrative  . None   Depression screen [facility] 2/9 12/10/2016 09/10/2016 08/21/2016 08/04/2016 07/21/2016  Decreased Interest 0 0 0 3 2  Down, Depressed, Hopeless 0 0 0 - 2  PHQ -  2 Score 0 0 0 3 4  Altered sleeping - - - - 2  Tired, decreased energy - - - - 2  Change in appetite - - - - 2  Feeling bad or failure about yourself  - - - - 1  Trouble concentrating - - - - 0  Moving slowly or fidgety/restless - - - - 0  Suicidal thoughts - - - - 2  PHQ-9 Score - - - - 13  Difficult doing work/chores - - - - -  Some recent data might be hidden    Review of Systems See hpi    Objective:   Physical Exam  Constitutional: He is oriented to person, place, and time. He appears well-developed and well-nourished. No distress.  HENT:  Head: Normocephalic and atraumatic.  Eyes: Conjunctivae are normal. Pupils are equal, round, and reactive to light. No scleral icterus.  Neck: Normal range of motion. Neck supple. No thyromegaly present.  Cardiovascular: Normal rate, regular rhythm, normal heart sounds and intact distal pulses.   Pulmonary/Chest:  Effort normal and breath sounds normal. No respiratory distress.  Musculoskeletal: He exhibits no edema.  Lymphadenopathy:    He has no cervical adenopathy.  Neurological: He is alert and oriented to person, place, and time.  Skin: Skin is warm and dry. He is not diaphoretic.  Psychiatric: He has a normal mood and affect. His behavior is normal.    BP (!) 141/88   Pulse 83   Temp 98.1 F (36.7 C) (Oral)   Resp 18   Ht _0  (1.803 m)   Wt (!) 329 lb (149.2 kg)   SpO2 95%   BMI 45.89 kg/m   Results for orders placed or performed in visit on 12/18/16  POCT glucose (manual entry)  Result Value Ref Range   POC Glucose 341 (A) 70 - 99 mg/dl  POCT urinalysis dipstick  Result Value Ref Range   Color, UA yellow yellow   Clarity, UA clear clear   Glucose, UA >=1,000 (A) negative mg/dL   Bilirubin, UA negative negative   Ketones, POC UA negative negative mg/dL   Spec Grav, UA 1.010 1.010 - 1.025   Blood, UA negative negative   pH, UA 5.5 5.0 - 8.0   Protein Ur, POC negative negative mg/dL   Urobilinogen, UA 0.2 0.2 or 1.0 E.U./dL   Nitrite, UA Negative Negative   Leukocytes, UA Negative Negative       Assessment & Plan:  optho exam - done earlier this year at [facility]. Pneumovax-23  1. Uncontrolled type 2 diabetes mellitus with hyperglycemia, with long-term current use of insulin ([place])   2. AKI (acute kidney injury) ([place])   3. Moderate single current episode of major depressive disorder (HCC)   4. Obesity, Class III, BMI 40-49.9 (morbid obesity) ([place])   5. Hypokalemia   6. Type 2 diabetes mellitus without complication, without long-term current use of insulin (HCC)   7. Elevated blood pressure reading     Orders Placed This Encounter  Procedures  . Comprehensive metabolic panel  . Microalbumin/Creatinine Ratio, Urine  . TSH  . Glutamic acid decarboxylase auto abs  . Anti-islet cell antibody  . C-peptide  . Care order/instruction:    Scheduling Instructions:       Complete orders, AVS and go.  Marland Kitchen POCT glucose (manual entry)  . POCT urinalysis dipstick    Meds ordered this encounter  Medications  . Insulin Glargine (LANTUS SOLOSTAR) 100 UNIT/ML Solostar Pen  Sig: Inject 50 Units into the skin daily.    Dispense:  15 mL    Refill:  2  . insulin lispro (HUMALOG KWIKPEN) 100 UNIT/ML KiwkPen    Sig: Inject 0-0.15 mLs (0-15 Units total) into the skin 3 (three) times daily with meals. CBG < 70: eat or drink something sweet and recheck.    Dispense:  15 mL    Refill:  2  . Insulin Pen Needle (PEN NEEDLES 3/16") 31G X 5 MM MISC    Sig: Use as per instructions 3 times a day.    Dispense:  200 each    Refill:  11  . metFORMIN (GLUCOPHAGE) 1000 MG tablet    Sig: Take 1 tablet (1,000 mg total) by mouth 2 (two) times daily with a meal.    Dispense:  180 tablet    Refill:  1  . losartan (COZAAR) 25 MG tablet    Sig: Take 1 tablet (25 mg total) by mouth daily.    Dispense:  30 tablet    Refill:  0     [name], M.D.  Primary Care at [facility] [address] [place], [place] [address] [phone] phone [phone] fax  12/20/16 11:40 PM

## 2016-12-18 NOTE — Patient Instructions (Addendum)
  CBG 70 - 120: 0 units CBG 121 - 150: 2 units CBG 151 - 200: 3 units CBG 201 - 250: 5 units CBG 251 - 300: 8 units CBG 301 - 350: 11 units CBG 351 - 400: 15 units CBG > 400: call MD.    IF you received an x-ray today, you will receive an invoice from [facility]. Please contact [facility] at [phone] with questions or concerns regarding your invoice.   IF you received labwork today, you will receive an invoice from [facility]. Please contact [facility] at [phone] with questions or concerns regarding your invoice.   Our billing staff will not be able to assist you with questions regarding bills from these companies.  You will be contacted with the lab results as soon as they are available. The fastest way to get your results is to activate your My Chart account. Instructions are located on the last page of this paperwork. If you have not heard from [place] regarding the results in 2 weeks, please contact this office.

## 2016-12-19 LAB — COMPREHENSIVE METABOLIC PANEL
ALBUMIN: 4.1 g/dL (ref 3.5–5.5)
ALK PHOS: 70 IU/L (ref 39–117)
ALT: 60 IU/L — ABNORMAL HIGH (ref 0–44)
AST: 47 IU/L — ABNORMAL HIGH (ref 0–40)
Albumin/Globulin Ratio: 1.7 (ref 1.2–2.2)
BUN/Creatinine Ratio: 6 — ABNORMAL LOW (ref 9–20)
BUN: 9 mg/dL (ref 6–20)
Bilirubin Total: 0.3 mg/dL (ref 0.0–1.2)
CALCIUM: 9.8 mg/dL (ref 8.7–10.2)
CO2: 25 mmol/L (ref 20–29)
CREATININE: 1.43 mg/dL — AB (ref 0.76–1.27)
Chloride: 105 mmol/L (ref 96–106)
GFR calc Af Amer: 71 mL/min/{1.73_m2} (ref >59)
GFR, EST NON AFRICAN AMERICAN: 61 mL/min/{1.73_m2} (ref >59)
GLUCOSE: 342 mg/dL — AB (ref 65–99)
Globulin, Total: 2.4 g/dL (ref 1.5–4.5)
Potassium: 4.7 mmol/L (ref 3.5–5.2)
Sodium: 144 mmol/L (ref 134–144)
TOTAL PROTEIN: 6.5 g/dL (ref 6.0–8.5)

## 2016-12-19 LAB — GLUTAMIC ACID DECARBOXYLASE AUTO ABS

## 2016-12-19 LAB — C-PEPTIDE: C-Peptide: 3.8 ng/mL (ref 1.1–4.4)

## 2016-12-19 LAB — ANTI-ISLET CELL ANTIBODY: Islet Cell Ab: NEGATIVE

## 2016-12-19 LAB — MICROALBUMIN / CREATININE URINE RATIO
CREATININE, UR: 126.4 mg/dL
Microalb/Creat Ratio: 20.1 mg/g creat (ref 0.0–30.0)
Microalbumin, Urine: 25.4 ug/mL

## 2016-12-19 LAB — TSH: TSH: 2.51 u[IU]/mL (ref 0.450–4.500)

## 2017-01-01 ENCOUNTER — Ambulatory Visit: Payer: Self-pay

## 2017-01-08 ENCOUNTER — Encounter: Payer: Self-pay | Admitting: Family Medicine

## 2017-01-08 ENCOUNTER — Ambulatory Visit (INDEPENDENT_AMBULATORY_CARE_PROVIDER_SITE_OTHER): Payer: Managed Care, Other (non HMO) | Admitting: Family Medicine

## 2017-01-08 ENCOUNTER — Ambulatory Visit: Payer: Self-pay

## 2017-01-08 VITALS — BP 130/81 | HR 89 | Temp 97.9°F | Resp 18 | Ht 71.0 in | Wt 324.0 lb

## 2017-01-08 DIAGNOSIS — E119 Type 2 diabetes mellitus without complications: Secondary | ICD-10-CM | POA: Diagnosis not present

## 2017-01-08 LAB — GLUCOSE, POCT (MANUAL RESULT ENTRY): POC Glucose: 84 mg/dl (ref 70–99)

## 2017-01-08 NOTE — Patient Instructions (Addendum)
Decrease your Lantus to 40 units a day. If your blood sugars continue to look so great after another 2 weeks, then decrease your Lantus to 30 units.   If at any point your blood sugars start increasing, I would recommend [place] switching you to sleep quite rather than increasing back her Lantus since that medication has the Lixisenatide combined with the Lantus that is going to help you lose more weight as well as maintain more stable blood sugars over time so we can you off of insulin.    IF you received an x-ray today, you will receive an invoice from [facility]. Please contact [facility] at [phone] with questions or concerns regarding your invoice.   IF you received labwork today, you will receive an invoice from [facility]. Please contact [facility] at [phone] with questions or concerns regarding your invoice.   Our billing staff will not be able to assist you with questions regarding bills from these companies.  You will be contacted with the lab results as soon as they are available. The fastest way to get your results is to activate your My Chart account. Instructions are located on the last page of this paperwork. If you have not heard from [place] regarding the results in 2 weeks, please contact this office.

## 2017-01-08 NOTE — Progress Notes (Signed)
Subjective:    Patient ID: Marc Long, male    DOB: [birth year], 41 y.o.   MRN: [id number] Chief Complaint  Patient presents with  . Diabetes  . Follow-up    HPI  Saw psych yesterday who noted that the Abilify might have pushed him over the edge for weight gain so she recommended starting on a more weight neutral antipsychotic at the end of this month - possible Latuda or another.  THinking about following up here - likely about a month   Past Medical History:  Diagnosis Date  . Depression   . Diabetes mellitus without complication ([place])   . Gallstones   . Obesity, Class III, BMI 40-49.9 (morbid obesity) ([place])    History reviewed. No pertinent surgical history. Current Outpatient Prescriptions on File Prior to Visit  Medication Sig Dispense Refill  . ARIPiprazole (ABILIFY) 5 MG tablet TK 1 T PO QD  1  . blood glucose meter kit and supplies Dispense based on patient and insurance preference. Use up to four times daily as directed. (FOR ICD-9 250.00, 250.01). 1 each 0  . docusate sodium (COLACE) 100 MG capsule Take 2 capsules (200 mg total) by mouth 2 (two) times daily. 120 capsule 0  . Insulin Glargine (LANTUS SOLOSTAR) 100 UNIT/ML Solostar Pen Inject 50 Units into the skin daily. 15 mL 2  . insulin lispro (HUMALOG KWIKPEN) 100 UNIT/ML KiwkPen Inject 0-0.15 mLs (0-15 Units total) into the skin 3 (three) times daily with meals. CBG < 70: eat or drink something sweet and recheck. 15 mL 2  . Insulin Pen Needle (PEN NEEDLES 3/16") 31G X 5 MM MISC Use as per instructions 3 times a day. 200 each 11  . lamoTRIgine (LAMICTAL) 200 MG tablet TK 1 T PO QD  1  . lithium carbonate (LITHOBID) 300 MG CR tablet Take 3 tablets by mouth daily  1  . LORazepam (ATIVAN) 0.5 MG tablet Take 0.5 mg by mouth every 8 (eight) hours as needed for anxiety.     Marland Kitchen losartan (COZAAR) 25 MG tablet Take 1 tablet (25 mg total) by mouth daily. 30 tablet 0  . metFORMIN (GLUCOPHAGE) 1000 MG tablet Take 1 tablet (1,000  mg total) by mouth 2 (two) times daily with a meal. 180 tablet 1  . Multiple Vitamins-Minerals (MULTIVITAMIN WITH MINERALS) tablet Take 1 tablet by mouth daily.    . polyethylene glycol (MIRALAX / GLYCOLAX) packet Take 17 g by mouth daily. 14 each 0   No current facility-administered medications on file prior to visit.    Allergies  Allergen Reactions  . Bee Venom Swelling  . Keflex [Cephalexin]   . Relafen [Nabumetone]     Blood in stool  . Shellfish Allergy Nausea Only   Family History  Problem Relation Age of Onset  . Hypertension Mother   . Depression Mother   . Heart attack Maternal Grandfather   . Lymphoma Maternal Grandmother   . Diabetes Maternal Aunt   . Cancer Maternal Aunt    Social History   Social History  . Marital status: Single    Spouse name: N/A  . Number of children: 0  . Years of education: N/A   Occupational History  . call center    Social History Main Topics  . Smoking status: Never Smoker  . Smokeless tobacco: Never Used  . Alcohol use 0.0 oz/week     Comment: once every 2 weeks. Wine coolers  . Drug use: No  . Sexual activity: Yes  Birth control/ protection: None   Other Topics Concern  . None   Social History Narrative  . None   Depression screen [facility] 2/9 01/08/2017 12/10/2016 09/10/2016 08/21/2016 08/04/2016  Decreased Interest 0 0 0 0 3  Down, Depressed, Hopeless 0 0 0 0 -  PHQ - 2 Score 0 0 0 0 3  Altered sleeping - - - - -  Tired, decreased energy - - - - -  Change in appetite - - - - -  Feeling bad or failure about yourself  - - - - -  Trouble concentrating - - - - -  Moving slowly or fidgety/restless - - - - -  Suicidal thoughts - - - - -  PHQ-9 Score - - - - -  Difficult doing work/chores - - - - -  Some recent data might be hidden    Review of Systems See hpi    Objective:   Physical Exam  Constitutional: He is oriented to person, place, and time. He appears well-developed and well-nourished. No distress.  HENT:    Head: Normocephalic and atraumatic.  Eyes: Pupils are equal, round, and reactive to light. Conjunctivae are normal. No scleral icterus.  Neck: Normal range of motion. Neck supple. No thyromegaly present.  Cardiovascular: Normal rate, regular rhythm, normal heart sounds and intact distal pulses.   Pulmonary/Chest: Effort normal and breath sounds normal. No respiratory distress.  Musculoskeletal: He exhibits no edema.  Lymphadenopathy:    He has no cervical adenopathy.  Neurological: He is alert and oriented to person, place, and time.  Skin: Skin is warm and dry. He is not diaphoretic.  Psychiatric: He has a normal mood and affect. His behavior is normal.   BP 130/81   Pulse 89   Temp 97.9 F (36.6 C) (Oral)   Resp 18   Ht _0  (1.803 m)   Wt (!) 324 lb (147 kg)   SpO2 97%   BMI 45.19 kg/m      Assessment & Plan:   1. Type 2 diabetes mellitus without complication, without long-term current use of insulin (H[facility])   DOING AMAZING!!! Very detailed log reviewed. Decrease lantus form 50u to 40u. Cont humalog ss before meals (has not needed at all!!) Consider changing to soliqua - will def aim towards getting off insulin - should be able to get onto oral meds. Reassess after abilify is changed.   Orders Placed This Encounter  Procedures  . Fructosamine  . Comprehensive metabolic panel  . POCT glucose (manual entry)    [name], M.D.  Primary Care at [facility] [address] [place],  [address] [phone] phone [phone] fax  01/10/17 11:45 PM  Results for orders placed or performed in visit on 01/08/17  Fructosamine  Result Value Ref Range   Fructosamine 269 0 - 285 umol/L  Comprehensive metabolic panel  Result Value Ref Range   Glucose 83 65 - 99 mg/dL   BUN 12 6 - 20 mg/dL   Creatinine, Ser 1.52 (H) 0.76 - 1.27 mg/dL   GFR calc non Af Amer 57 (L) >59 mL/min/1.73   GFR calc Af Amer 66 >59 mL/min/1.73   BUN/Creatinine Ratio 8 (L) 9 - 20    Sodium 144 134 - 144 mmol/L   Potassium 4.7 3.5 - 5.2 mmol/L   Chloride 104 96 - 106 mmol/L   CO2 22 20 - 29 mmol/L   Calcium 9.9 8.7 - 10.2 mg/dL   Total Protein 7.4 6.0 -  8.5 g/dL   Albumin 4.8 3.5 - 5.5 g/dL   Globulin, Total 2.6 1.5 - 4.5 g/dL   Albumin/Globulin Ratio 1.8 1.2 - 2.2   Bilirubin Total 0.3 0.0 - 1.2 mg/dL   Alkaline Phosphatase 66 39 - 117 IU/L   AST 28 0 - 40 IU/L   ALT 36 0 - 44 IU/L  POCT glucose (manual entry)  Result Value Ref Range   POC Glucose 84 70 - 99 mg/dl

## 2017-01-09 LAB — COMPREHENSIVE METABOLIC PANEL
A/G RATIO: 1.8 (ref 1.2–2.2)
ALT: 36 IU/L (ref 0–44)
AST: 28 IU/L (ref 0–40)
Albumin: 4.8 g/dL (ref 3.5–5.5)
Alkaline Phosphatase: 66 IU/L (ref 39–117)
BILIRUBIN TOTAL: 0.3 mg/dL (ref 0.0–1.2)
BUN/Creatinine Ratio: 8 — ABNORMAL LOW (ref 9–20)
BUN: 12 mg/dL (ref 6–20)
CALCIUM: 9.9 mg/dL (ref 8.7–10.2)
CHLORIDE: 104 mmol/L (ref 96–106)
CO2: 22 mmol/L (ref 20–29)
Creatinine, Ser: 1.52 mg/dL — ABNORMAL HIGH (ref 0.76–1.27)
GFR calc Af Amer: 66 mL/min/{1.73_m2} (ref >59)
GFR calc non Af Amer: 57 mL/min/{1.73_m2} — ABNORMAL LOW (ref >59)
Globulin, Total: 2.6 g/dL (ref 1.5–4.5)
Glucose: 83 mg/dL (ref 65–99)
POTASSIUM: 4.7 mmol/L (ref 3.5–5.2)
Sodium: 144 mmol/L (ref 134–144)
Total Protein: 7.4 g/dL (ref 6.0–8.5)

## 2017-01-09 LAB — FRUCTOSAMINE: FRUCTOSAMINE: 269 umol/L (ref 0–285)

## 2017-01-15 ENCOUNTER — Other Ambulatory Visit: Payer: Self-pay | Admitting: Family Medicine

## 2017-01-15 ENCOUNTER — Ambulatory Visit: Payer: Self-pay

## 2017-01-16 ENCOUNTER — Other Ambulatory Visit: Payer: Self-pay

## 2017-02-05 ENCOUNTER — Encounter: Payer: Self-pay | Admitting: Family Medicine

## 2017-02-05 ENCOUNTER — Ambulatory Visit (INDEPENDENT_AMBULATORY_CARE_PROVIDER_SITE_OTHER): Payer: Managed Care, Other (non HMO) | Admitting: Family Medicine

## 2017-02-05 VITALS — BP 113/74 | HR 92 | Temp 98.4°F | Resp 16 | Ht 72.05 in | Wt 323.0 lb

## 2017-02-05 DIAGNOSIS — E1165 Type 2 diabetes mellitus with hyperglycemia: Secondary | ICD-10-CM | POA: Diagnosis not present

## 2017-02-05 DIAGNOSIS — E781 Pure hyperglyceridemia: Secondary | ICD-10-CM | POA: Insufficient documentation

## 2017-02-05 DIAGNOSIS — Z5181 Encounter for therapeutic drug level monitoring: Secondary | ICD-10-CM | POA: Diagnosis not present

## 2017-02-05 DIAGNOSIS — Z794 Long term (current) use of insulin: Secondary | ICD-10-CM

## 2017-02-05 MED ORDER — ARIPIPRAZOLE 5 MG PO TABS: ORAL_TABLET | ORAL | 0 refills | Status: DC

## 2017-02-05 MED ORDER — EXENATIDE ER 2 MG/0.85ML ~~LOC~~ AUIJ: 2.0000 mg | AUTO-INJECTOR | SUBCUTANEOUS | 5 refills | Status: DC

## 2017-02-05 MED ORDER — INSULIN GLARGINE 100 UNIT/ML SOLOSTAR PEN
20.0000 [IU] | PEN_INJECTOR | Freq: Every day | SUBCUTANEOUS | 0 refills | Status: DC
Start: 2017-02-05 — End: 2017-12-10

## 2017-02-05 NOTE — Patient Instructions (Addendum)
[email or web address]/[email or web address]-[email or web address]  GO to the website above to fill out a form so that you can get card so that you can get the bydureon FREE every month.  In the meantime, take the coupon that we gave you today to get your first month free then you can enroll in the above.  Decrease her Lantus to 20 units a day want to start the Bydureon. You may have high blood sugars during the first week. After the first week if your blood sugars are greater than 150 for 3 mornings in the retro-increase her Lantus by 5 units. If your blood sugars in the morning continued to be greater than 150 for another 3 days in a row, then increase your Lantus by another 5 units (so you would be back up to 30u.) If you have any low blood sugars, decrease your Lantus by 2 units the following day and continue at the new lower dose. Any time you have another low blood sugar, decrease your Lantus by another 2 units permanently, etc.   IF you received an x-ray today, you will receive an invoice from [facility]. Please contact [facility] at [phone] with questions or concerns regarding your invoice.   IF you received labwork today, you will receive an invoice from [facility]. Please contact [facility] at [phone] with questions or concerns regarding your invoice.   Our billing staff will not be able to assist you with questions regarding bills from these companies.  You will be contacted with the lab results as soon as they are available. The fastest way to get your results is to activate your My Chart account. Instructions are located on the last page of this paperwork. If you have not heard from [place] regarding the results in 2 weeks, please contact this office.     Diabetes Mellitus and Standards of Medical Care Managing diabetes (diabetes mellitus) can be complicated. Your diabetes treatment may be managed by a team of health care providers, including:  A diet and nutrition  specialist (registered dietitian).  A nurse.  A certified diabetes educator (CDE).  A diabetes specialist (endocrinologist).  An eye doctor.  A primary care provider.  A dentist.  Your health care providers follow a schedule in order to help you get the best quality of care. The following schedule is a general guideline for your diabetes management plan. Your health care providers may also give you more specific instructions. HbA1c ( hemoglobin A1c) test This test provides information about blood sugar (glucose) control over the previous 2-3 months. It is used to check whether your diabetes management plan needs to be adjusted.  If you are meeting your treatment goals, this test is done at least 2 times a year.  If you are not meeting treatment goals or if your treatment goals have changed, this test is done 4 times a year.  Blood pressure test  This test is done at every routine medical visit. For most people, the goal is less than 130/80. Ask your health care provider what your goal blood pressure should be. Dental and eye exams  Visit your dentist two times a year.  If you have type 1 diabetes, get an eye exam 3-5 years after you are diagnosed, and then once a year after your first exam. ? If you were diagnosed with type 1 diabetes as a child, get an eye exam when you are age 30 or older and have had diabetes for 3-5 years. After the first exam, you should get an  eye exam once a year.  If you have type 2 diabetes, have an eye exam as soon as you are diagnosed, and then once a year after your first exam. Foot care exam  Visual foot exams are done at every routine medical visit. The exams check for cuts, bruises, redness, blisters, sores, or other problems with the feet.  A complete foot exam is done by your health care provider once a year. This exam includes an inspection of the structure and skin of your feet, and a check of the pulses and sensation in your feet. ? Type 1  diabetes: Get your first exam 3-5 years after diagnosis. ? Type 2 diabetes: Get your first exam as soon as you are diagnosed.  Check your feet every day for cuts, bruises, redness, blisters, or sores. If you have any of these or other problems that are not healing, contact your health care provider. Kidney function test ( urine microalbumin)  This test is done once a year. ? Type 1 diabetes: Get your first test 5 years after diagnosis. ? Type 2 diabetes: Get your first test as soon as you are diagnosed.  If you have chronic kidney disease (CKD), get a serum creatinine and estimated glomerular filtration rate (eGFR) test once a year. Lipid profile (cholesterol, HDL, LDL, triglycerides)  This test should be done when you are diagnosed with diabetes, and every 5 years after the first test. If you are on medicines to lower your cholesterol, you may need to get this test done every year. ? The goal for LDL is less than 100 mg/dL (5.5 mmol/L). If you are at high risk, the goal is less than 70 mg/dL (3.9 mmol/L). ? The goal for HDL is 40 mg/dL (2.2 mmol/L) for men and 50 mg/dL(2.8 mmol/L) for women. An HDL cholesterol of 60 mg/dL (3.3 mmol/L) or higher gives some protection against heart disease. ? The goal for triglycerides is less than 150 mg/dL (8.3 mmol/L). Immunizations  The yearly flu (influenza) vaccine is recommended for everyone 6 months or older who has diabetes.  The pneumonia (pneumococcal) vaccine is recommended for everyone 2 years or older who has diabetes. If you are 21 or older, you may get the pneumonia vaccine as a series of two separate shots.  The hepatitis B vaccine is recommended for adults shortly after they have been diagnosed with diabetes.  The Tdap (tetanus, diphtheria, and pertussis) vaccine should be given: ? According to normal childhood vaccination schedules, for children. ? Every 10 years, for adults who have diabetes.  The shingles vaccine is recommended for  people who have had chicken pox and are 50 years or older. Mental and emotional health  Screening for symptoms of eating disorders, anxiety, and depression is recommended at the time of diagnosis and afterward as needed. If your screening shows that you have symptoms (you have a positive screening result), you may need further evaluation and be referred to a mental health care provider. Diabetes self-management education  Education about how to manage your diabetes is recommended at diagnosis and ongoing as needed. Treatment plan  Your treatment plan will be reviewed at every medical visit. Summary  Managing diabetes (diabetes mellitus) can be complicated. Your diabetes treatment may be managed by a team of health care providers.  Your health care providers follow a schedule in order to help you get the best quality of care.  Standards of care including having regular physical exams, blood tests, blood pressure monitoring, immunizations, screening tests, and  education about how to manage your diabetes.  Your health care providers may also give you more specific instructions based on your individual health. This information is not intended to replace advice given to you by your health care provider. Make sure you discuss any questions you have with your health care provider. Document Released: 03/30/2009 Document Revised: 02/29/2016 Document Reviewed: 02/29/2016 Elsevier Interactive Patient Education  [employer].

## 2017-02-05 NOTE — Progress Notes (Addendum)
Subjective:    Patient ID: Marc Long, male    DOB: [birth year], 41 y.o.   MRN: [id number]   Chief Complaint  Patient presents with  . Diabetes    1 month follow-up    HPI  DmII:initially diagnosed with very acute episode of hyperglycemia requiring hospitalization and started on high-dose insulin immediately.  Now on metformin 1 g twice a day and has CBGs have been coming down quickly with lifestyle change as well as his psychiatrist changing his atypical antidepressant he has been able to wean off the high doses of Lantus inside his scale Humalog he was initially on. Microalbumin normal July 2018. He did agree to start on losartan 25 after last visit. Ophtho visit was March 2018 at [facility]. Lab Results  Component Value Date   HGBA1C 10.8 12/10/2016  fructosamineLevel one month prior was 269 with a normal range of up to 285. Healthy subjects range 205 -285 with uncontrolled diabetics at 228-563 with mean of 396. Unfortunately we have seen his creatinine rise over this time as well last eGFR was still 66. Lab Results  Component Value Date   CREATININE 1.52 (H) 01/08/2017   CREATININE 1.43 (H) 12/18/2016   CREATININE 1.23 12/14/2016   He has been taking 3 times a day and he knows when it spikes in the morning he often ate something he shouldn't have. Rare times when cbgs <70 but always feels fine whether high or low. Has still been taking lantus 40u but not using the humalog at all. Tol metformin well with rare diarrhea.   His psych dr had talked about switching to a more weight neutral med - he had an appt that she cancelled yest and hasn't been rescheduled yet.   Hypertriglyceridemia: Last total chol 309 with triglycerides 2277 - pt was sent to hosp after these labs. He was not started on a statin. LFTs nml.  Is not fasting today.   Past Medical History:  Diagnosis Date  . Depression   . Diabetes mellitus without complication ([place])   . Gallstones   . Obesity, Class III,  BMI 40-49.9 (morbid obesity) ([place])    History reviewed. No pertinent surgical history. Current Outpatient Prescriptions on File Prior to Visit  Medication Sig Dispense Refill  . blood glucose meter kit and supplies Dispense based on patient and insurance preference. Use up to four times daily as directed. (FOR ICD-9 250.00, 250.01). 1 each 0  . docusate sodium (COLACE) 100 MG capsule Take 2 capsules (200 mg total) by mouth 2 (two) times daily. 120 capsule 0  . insulin lispro (HUMALOG KWIKPEN) 100 UNIT/ML KiwkPen Inject 0-0.15 mLs (0-15 Units total) into the skin 3 (three) times daily with meals. CBG < 70: eat or drink something sweet and recheck. 15 mL 2  . Insulin Pen Needle (PEN NEEDLES 3/16") 31G X 5 MM MISC Use as per instructions 3 times a day. 200 each 11  . lamoTRIgine (LAMICTAL) 200 MG tablet TK 1 T PO QD  1  . lithium carbonate (LITHOBID) 300 MG CR tablet Take 3 tablets by mouth daily  1  . LORazepam (ATIVAN) 0.5 MG tablet Take 0.5 mg by mouth every 8 (eight) hours as needed for anxiety.     Marland Kitchen losartan (COZAAR) 25 MG tablet TAKE 1 TABLET(25 MG) BY MOUTH DAILY 30 tablet 0  . metFORMIN (GLUCOPHAGE) 1000 MG tablet Take 1 tablet (1,000 mg total) by mouth 2 (two) times daily with a meal. 180 tablet 1  . Multiple  Vitamins-Minerals (MULTIVITAMIN WITH MINERALS) tablet Take 1 tablet by mouth daily.    . polyethylene glycol (MIRALAX / GLYCOLAX) packet Take 17 g by mouth daily. 14 each 0   No current facility-administered medications on file prior to visit.    Allergies  Allergen Reactions  . Bee Venom Swelling  . Keflex [Cephalexin]   . Relafen [Nabumetone]     Blood in stool  . Shellfish Allergy Nausea Only   Family History  Problem Relation Age of Onset  . Hypertension Mother   . Depression Mother   . Heart attack Maternal Grandfather   . Lymphoma Maternal Grandmother   . Diabetes Maternal Aunt   . Cancer Maternal Aunt    Social History   Social History  . Marital status:  Single    Spouse name: N/A  . Number of children: 0  . Years of education: N/A   Occupational History  . call center    Social History Main Topics  . Smoking status: Never Smoker  . Smokeless tobacco: Never Used  . Alcohol use 0.0 oz/week     Comment: once every 2 weeks. Wine coolers  . Drug use: No  . Sexual activity: Yes    Birth control/ protection: None   Other Topics Concern  . None   Social History Narrative  . None   Depression screen [facility] 2/9 02/05/2017 01/08/2017 12/10/2016 09/10/2016 08/21/2016  Decreased Interest 0 0 0 0 0  Down, Depressed, Hopeless 0 0 0 0 0  PHQ - 2 Score 0 0 0 0 0  Altered sleeping - - - - -  Tired, decreased energy - - - - -  Change in appetite - - - - -  Feeling bad or failure about yourself  - - - - -  Trouble concentrating - - - - -  Moving slowly or fidgety/restless - - - - -  Suicidal thoughts - - - - -  PHQ-9 Score - - - - -  Difficult doing work/chores - - - - -  Some recent data might be hidden    Review of Systems See hpi    Objective:   Physical Exam  Constitutional: He is oriented to person, place, and time. He appears well-developed and well-nourished. No distress.  HENT:  Head: Normocephalic and atraumatic.  Eyes: Pupils are equal, round, and reactive to light. Conjunctivae are normal. No scleral icterus.  Neck: Normal range of motion. Neck supple. No thyromegaly present.  Cardiovascular: Normal rate, regular rhythm, normal heart sounds and intact distal pulses.   Pulmonary/Chest: Effort normal and breath sounds normal. No respiratory distress.  Musculoskeletal: He exhibits no edema.  Lymphadenopathy:    He has no cervical adenopathy.  Neurological: He is alert and oriented to person, place, and time.  Skin: Skin is warm and dry. He is not diaphoretic.  Psychiatric: He has a normal mood and affect. His behavior is normal.         BP 113/74   Pulse 92   Temp 98.4 F (36.9 C) (Oral)   Resp 16   Ht 6' 0.05" (1.83 m)    Wt (!) 323 lb (146.5 kg)   SpO2 95%   BMI 43.75 kg/m   Assessment & Plan:  Bmp Pneumovax-23, flu - declines these today but will be willing to do in the future.   1. Uncontrolled type 2 diabetes mellitus with hyperglycemia, with long-term current use of insulin ([place]) - start bydureon. Decrease lantus from 40 to 20u. Pt  doing amazing at monitoring blood sugars fasting before each meal so see AVS with instructions to adjust lantus after bydureon kicks in in another 2 wks. Recheck in 1 mo.  2. Obesity, Class III, BMI 40-49.9 (morbid obesity) ([place])   3. Medication monitoring encounter   4. Hypertriglyceridemia   5.      Elev Cr - prior baseline Cr 1.2-1.3 which pt did return to on the last day of his hospitalization for acute severe hyperglycemia with ARF - since then though new baseline Cr 1.4-1.5. eGFR still 66-70. No sig change in Cr since pt was started on losartan 25 on 7/5. Cont. Neg/normal microalb/Cr ratio 12/18/16.  Orders Placed This Encounter  Procedures  . Comprehensive metabolic panel    Meds ordered this encounter  Medications  . ARIPiprazole (ABILIFY) 5 MG tablet    Sig: TK 1 T PO QD    Dispense:  30 tablet    Refill:  0  . Exenatide ER (BYDUREON BCISE) 2 MG/0.85ML AUIJ    Sig: Inject 2 mg into the skin once a week.    Dispense:  4 pen    Refill:  5  . Insulin Glargine (LANTUS SOLOSTAR) 100 UNIT/ML Solostar Pen    Sig: Inject 20 Units into the skin daily.    Dispense:  10 mL    Refill:  0    [name], M.D.  Primary Care at [facility] [address] [place], [place] [address] [phone] phone [phone] fax  02/08/17 5:34 AM

## 2017-02-06 LAB — COMPREHENSIVE METABOLIC PANEL
A/G RATIO: 1.8 (ref 1.2–2.2)
ALT: 29 IU/L (ref 0–44)
AST: 23 IU/L (ref 0–40)
Albumin: 4.6 g/dL (ref 3.5–5.5)
Alkaline Phosphatase: 58 IU/L (ref 39–117)
BILIRUBIN TOTAL: 0.3 mg/dL (ref 0.0–1.2)
BUN/Creatinine Ratio: 8 — ABNORMAL LOW (ref 9–20)
BUN: 12 mg/dL (ref 6–20)
CHLORIDE: 105 mmol/L (ref 96–106)
CO2: 23 mmol/L (ref 20–29)
Calcium: 9.6 mg/dL (ref 8.7–10.2)
Creatinine, Ser: 1.48 mg/dL — ABNORMAL HIGH (ref 0.76–1.27)
GFR calc Af Amer: 68 mL/min/{1.73_m2} (ref >59)
GFR calc non Af Amer: 59 mL/min/{1.73_m2} — ABNORMAL LOW (ref >59)
Globulin, Total: 2.6 g/dL (ref 1.5–4.5)
Glucose: 94 mg/dL (ref 65–99)
POTASSIUM: 4.4 mmol/L (ref 3.5–5.2)
Sodium: 144 mmol/L (ref 134–144)
Total Protein: 7.2 g/dL (ref 6.0–8.5)

## 2017-02-09 ENCOUNTER — Encounter: Payer: Self-pay | Admitting: *Deleted

## 2017-02-17 ENCOUNTER — Other Ambulatory Visit: Payer: Self-pay | Admitting: Family Medicine

## 2017-02-24 ENCOUNTER — Other Ambulatory Visit: Payer: Self-pay | Admitting: Family Medicine

## 2017-03-05 ENCOUNTER — Encounter: Payer: Self-pay | Admitting: Family Medicine

## 2017-03-05 ENCOUNTER — Ambulatory Visit (INDEPENDENT_AMBULATORY_CARE_PROVIDER_SITE_OTHER): Payer: Self-pay | Admitting: Family Medicine

## 2017-03-05 VITALS — BP 128/90 | HR 79 | Temp 97.9°F | Resp 18 | Ht 72.5 in | Wt 326.0 lb

## 2017-03-05 DIAGNOSIS — E781 Pure hyperglyceridemia: Secondary | ICD-10-CM

## 2017-03-05 DIAGNOSIS — E119 Type 2 diabetes mellitus without complications: Secondary | ICD-10-CM

## 2017-03-05 DIAGNOSIS — N179 Acute kidney failure, unspecified: Secondary | ICD-10-CM

## 2017-03-05 DIAGNOSIS — E66813 Obesity, class 3: Secondary | ICD-10-CM

## 2017-03-05 LAB — POCT GLYCOSYLATED HEMOGLOBIN (HGB A1C): Hemoglobin A1C: 6.5

## 2017-03-05 MED ORDER — DAPAGLIFLOZIN PROPANEDIOL 10 MG PO TABS: 10.0000 mg | ORAL_TABLET | Freq: Every day | ORAL | 5 refills | Status: DC

## 2017-03-05 NOTE — Patient Instructions (Addendum)
Decrease your Lantus to 15 units at night.  When you start the first week, try to stop the Lantus.  If your sugar spikes greater than 200, you can use 10 units of Humalog to bring it down.     IF you received an x-ray today, you will receive an invoice from [facility]. Please contact [facility] at [phone] with questions or concerns regarding your invoice.   IF you received labwork today, you will receive an invoice from [facility]. Please contact [facility] at [phone] with questions or concerns regarding your invoice.   Our billing staff will not be able to assist you with questions regarding bills from these companies.  You will be contacted with the lab results as soon as they are available. The fastest way to get your results is to activate your My Chart account. Instructions are located on the last page of this paperwork. If you have not heard from [place] regarding the results in 2 weeks, please contact this office.     Correction Insulin Correction insulin, also called corrective insulin or a supplemental dose, is a small amount of insulin that can be used to lower your blood sugar (glucose) if it is too high. You may be instructed to check your blood glucose at certain times of the day and to use correction insulin as needed to lower your blood glucose to your target range. Correction insulin is primarily used as part of diabetes management. It may also be prescribed for people who do not have diabetes. What is a correction scale? A correction scale, also called a sliding scale, is prescribed by your health care provider to help you determine when you need correction insulin. Your correction scale is based on your individual treatment goals, and it has two parts:  Ranges of blood glucose levels.  How much correction insulin to give yourself if your blood sugar falls within a certain range.  If your blood glucose is in your desired range, you will not need  correction insulin and you should take your normal insulin dose. What type of insulin do I need? Your health care provider may prescribe rapid-acting or short-acting insulin for you to use as correction insulin. Rapid-acting insulin:  Starts working quickly, in as little as 5 minutes.  Can last for 3-6 hours.  Works well when taken right before a meal to quickly lower blood glucose. Short-acting insulin:  Starts working in about 30 minutes.  Can last for 6-8 hours.  Should be taken about 30 minutes before you start eating a meal. Talk with your health care provider or pharmacist about which type of correction insulin to take and when to take it. If you use insulin to control your diabetes, you should use correction insulin in addition to the longer-acting (basal) insulin that you normally use. How do I manage my blood glucose with correction insulin? Giving a correction dose  Check your blood glucose as directed by your health care provider.  Use your correction scale to find the range that your blood glucose is in.  Identify the units of insulin that match your blood glucose range.  Make sure you have food available that you can eat in the next 15-30 minutes, after your correction dose.  Give yourself the dose of correction insulin that your health care provider has prescribed in your correction scale. Always make sure you are using the right type of insulin. ? If your correction insulin is rapid-acting, start eating a meal within 15 minutes after  your correction dose to keep your blood glucose from getting too low. ? If your correction insulin is short-acting, start eating a meal within 30 minutes after your correction dose to keep your blood glucose from getting too low. Keeping a blood glucose log  Write down your blood glucose test results and the amount of insulin that you give yourself. Do this every time you check blood glucose or take insulin. Bring this log with you to  your medical visits. This information will help your health care provider to manage your medicines.  Note anything that may affect your blood glucose, such as: ? Changes in normal exercise or activity. ? Changes in your normal schedule, such as changes in your sleep routine, going on vacation, changing your diet, or holidays. ? New over-the-counter or prescription medicines. ? Illness, stress, or anxiety. ? Changes in the time that you took your medicine or insulin. ? Changes in your meals, such as skipping a meal, having a late meal, or dining out. ? Eating things that may affect blood glucose, such as snacks, meal portions that are larger than normal, drinks that contain sugar, or eating less than usual. What do I need to know about hyperglycemia and hypoglycemia? What is hyperglycemia? Hyperglycemia, also called high blood glucose, occurs when blood glucose is too high. Make sure you know the early signs of hyperglycemia, such as:  Increased thirst.  Hunger.  Feeling very tired.  Needing to urinate more often than usual.  Blurry vision.  What is hypoglycemia? Hypoglycemia is also called low blood glucose. Be aware of "stacking" your insulin doses. This happens when you correct a high blood glucose by giving yourself extra insulin too soon after a previous correction dose or mealtime dose. This may cause you to have too much insulin in your body and may put you at risk for hypoglycemia. Hypoglycemia occurs with a blood glucose level at or below 70 mg/dL (3.9 mmol/L). It is important to know the symptoms of hypoglycemia and treat it right away. Always have a 15-gram rapid-acting carbohydrate snack with you to treat low blood glucose. Family members and close friends should also know the symptoms and should understand how to treat hypoglycemia, in case you are not able to treat yourself. What are the symptoms of hypoglycemia? Hypoglycemia symptoms can  include:  Hunger.  Anxiety.  Sweating and feeling clammy.  Confusion.  Dizziness or light-headedness.  Sleepiness.  Nausea.  Increased heart rate.  Headache.  Blurry vision.  Jerky movements that you cannot control (seizure).  Nightmares.  Tingling or numbness around the mouth, lips, or tongue.  A change in speech.  Decreased ability to concentrate.  A change in coordination.  Restless sleep.  Tremors or shakes.  Fainting.  Irritability.  How do I treat hypoglycemia? If you are alert and able to swallow safely, follow the 15:15 rule:  Take 15 grams of a rapid-acting carbohydrate. Rapid-acting options include: ? 1 tube of glucose gel. ? 3 glucose pills. ? 6-8 pieces of hard candy. ? 4 oz (120 mL) of fruit juice. ? 4 oz (120 mL) of regular (not diet) soda.  Check your blood glucose 15 minutes after you take the carbohydrate. ? If the repeat blood glucose level is still at or below 70 mg/dL (3.9 mmol/L), take 15 grams of a carbohydrate again. ? If your blood glucose level does not increase above 70 mg/dL (3.9 mmol/L) after 3 tries, seek emergency medical care.  After your blood glucose level returns to  normal, eat a meal or a snack within 1 hour.  How do I treat severe hypoglycemia? Severe hypoglycemia is when your blood glucose level is at or below 54 mg/dL (3 mmol/L). Severe hypoglycemia is an emergency. Do not wait to see if the symptoms will go away. Get medical help right away. Call your local emergency services (911 in the [place] Do not drive yourself to the hospital. If you have severe hypoglycemia and you cannot eat or drink, you may need an injection of glucagon. A family member or close friend should learn how to check your blood glucose and how to give you a glucagon injection. Ask your health care provider if you need to have an emergency glucagon injection kit available. Severe hypoglycemia may need to be treated in a hospital. The treatment may  include getting glucose through an IV tube. You may also need treatment for the cause of your hypoglycemia. Why do I need correction insulin if I do not have diabetes? If you do not have diabetes, your health care provider may prescribe insulin because:  Keeping your blood glucose in the target range is important for your overall health.  You are taking medicines that cause your blood glucose to be higher than normal.  Contact a health care provider if:  You develop low blood glucose that you are not able to treat yourself.  You have high blood glucose that you are not able to correct with correction insulin.  Your blood glucose is often too low.  You used emergency glucagon to treat low blood glucose. Get help right away if:  You become unresponsive. If this happens, someone else should call emergency services (911 in the [place] right away.  Your blood glucose is lower than 54 mg/dL (3.0 mmol/L).  You become confused or you have trouble thinking clearly.  You have difficulty breathing. Summary  Correction insulin is a small amount of insulin that can be used to lower your blood sugar (glucose) if it is too high.  Talk with your health care provider or pharmacist about which type of correction insulin to take and when to take it. If you use insulin to control your diabetes, you should use correction insulin in addition to the longer-acting (basal) insulin that you normally use.  You may be instructed to check your blood glucose at certain times of the day and to use correction insulin as needed to lower your blood glucose to your target range. Always keep a log of your blood glucose values and the amount of insulin that you used.  It is important to know the symptoms of hypoglycemia and treat it right away. Always have a 15-gram rapid-acting carbohydrate snack with you to treat low blood glucose. Family members and close friends should also know the symptoms and should understand how  to treat hypoglycemia, in case you are not able to treat yourself. This information is not intended to replace advice given to you by your health care provider. Make sure you discuss any questions you have with your health care provider. Document Released: 10/24/2010 Document Revised: 02/29/2016 Document Reviewed: 02/29/2016 Elsevier Interactive Patient Education  [employer].

## 2017-03-05 NOTE — Progress Notes (Signed)
Subjective:    Patient ID: Marc Long, male    DOB: 03-31-1976, 41 y.o.   MRN: [id number]   Chief Complaint  Patient presents with  . Diabetes    Pt check sugars at home and states they have been good.  . Follow-up    HPI   DMII: Diagnosed .   Lab Results  Component Value Date   HGBA1C 10.8 12/10/2016   CBGs: fasting a.m. ; after meal  ; No hypoglycemic episodes.  Meter type:  Diet:  Exercising:  DM Med Regimen: Prior changes:   eGFR:  Baseline Cr:  Last checked . Microalb: Done . Normal. On acei Lipids:  LDL ,  non-HDL .  Last levels done  - were improving from prior. On statin. Taking asa 81 qd.  Optho: Seen annually by - last exam  Feet: Monofilament exam done . Denies any no problems.  Not seen by podiatry prior.  Immunizations:  Influenza:  Pneumovax-23:   DmII:initially diagnosed with very acute episode of hyperglycemia requiring hospitalization and started on high-dose insulin immediately.  Now on metformin 1 g twice a day and has CBGs have been coming down quickly with lifestyle change as well as his psychiatrist changing his atypical antidepressant he has been able to wean off the high doses of Lantus inside his scale Humalog he was initially on. Microalbumin normal July 2018. He did agree to start on losartan 25  7/5 Ophtho visit was March 2018 at [facility]. Lab Results  Component Value Date   HGBA1C 10.8 12/10/2016  fructosamineLevel one month prior was 269 with a normal range of up to 285. Healthy subjects range 205 -285 with uncontrolled diabetics at 228-563 with mean of 396. Unfortunately we have seen his creatinine rise over this time as well last eGFR was still 66. Lab Results  Component Value Date   CREATININE 1.52 (H) 01/08/2017   CREATININE 1.43 (H) 12/18/2016   CREATININE 1.23 12/14/2016   He has been taking 3 times a day and he knows when it spikes in the morning he often ate something he shouldn't have. Rare times when cbgs <70 but  always feels fine whether high or low. Has still been taking lantus 20u but not using the humalog at all. Tol metformin and Bydureon well with rare diarrhea.  Feels like since he started Bydureon and we decrease lantus from 40 to 20 his cbgs have been a little her but still around 80 in the morning and never sees a cbg >120.   His psych dr (Dr. Jeanette Caprice) has switched him to the more weight neutral Vraylar which seems to be working better - off of Abilify.   Hypertriglyceridemia: Last total chol 309 with triglycerides 2277 - pt was sent to hosp after these labs. He was not started on a statin. LFTs nml.  Is fasting today.   Past Medical History:  Diagnosis Date  . Depression   . Diabetes mellitus without complication ([place])   . Gallstones   . Obesity, Class III, BMI 40-49.9 (morbid obesity) ([place])    No past surgical history on file. Current Outpatient Prescriptions on File Prior to Visit  Medication Sig Dispense Refill  . ACCU-CHEK GUIDE test strip USE UP TO 4 TIMES EVERY DAY AS DIRECTED 200 each 0  . ARIPiprazole (ABILIFY) 5 MG tablet TK 1 T PO QD 30 tablet 0  . blood glucose meter kit and supplies Dispense based on patient and insurance preference. Use up to four times daily as directed. (  FOR ICD-9 250.00, 250.01). 1 each 0  . docusate sodium (COLACE) 100 MG capsule Take 2 capsules (200 mg total) by mouth 2 (two) times daily. 120 capsule 0  . Exenatide ER (BYDUREON BCISE) 2 MG/0.85ML AUIJ Inject 2 mg into the skin once a week. 4 pen 5  . Insulin Glargine (LANTUS SOLOSTAR) 100 UNIT/ML Solostar Pen Inject 20 Units into the skin daily. 10 mL 0  . insulin lispro (HUMALOG KWIKPEN) 100 UNIT/ML KiwkPen Inject 0-0.15 mLs (0-15 Units total) into the skin 3 (three) times daily with meals. CBG < 70: eat or drink something sweet and recheck. 15 mL 2  . Insulin Pen Needle (PEN NEEDLES 3/16") 31G X 5 MM MISC Use as per instructions 3 times a day. 200 each 11  . lamoTRIgine (LAMICTAL) 200 MG tablet TK 1 T PO  QD  1  . lithium carbonate (LITHOBID) 300 MG CR tablet Take 3 tablets by mouth daily  1  . LORazepam (ATIVAN) 0.5 MG tablet Take 0.5 mg by mouth every 8 (eight) hours as needed for anxiety.     Marland Kitchen losartan (COZAAR) 25 MG tablet TAKE 1 TABLET(25 MG) BY MOUTH DAILY 90 tablet 1  . metFORMIN (GLUCOPHAGE) 1000 MG tablet Take 1 tablet (1,000 mg total) by mouth 2 (two) times daily with a meal. 180 tablet 1  . Multiple Vitamins-Minerals (MULTIVITAMIN WITH MINERALS) tablet Take 1 tablet by mouth daily.    . polyethylene glycol (MIRALAX / GLYCOLAX) packet Take 17 g by mouth daily. 14 each 0   No current facility-administered medications on file prior to visit.    Allergies  Allergen Reactions  . Bee Venom Swelling  . Keflex [Cephalexin]   . Relafen [Nabumetone]     Blood in stool  . Shellfish Allergy Nausea Only   Family History  Problem Relation Age of Onset  . Hypertension Mother   . Depression Mother   . Heart attack Maternal Grandfather   . Lymphoma Maternal Grandmother   . Diabetes Maternal Aunt   . Cancer Maternal Aunt    Social History   Social History  . Marital status: Single    Spouse name: N/A  . Number of children: 0  . Years of education: N/A   Occupational History  . call center    Social History Main Topics  . Smoking status: Never Smoker  . Smokeless tobacco: Never Used  . Alcohol use 0.0 oz/week     Comment: once every 2 weeks. Wine coolers  . Drug use: No  . Sexual activity: Yes    Birth control/ protection: None   Other Topics Concern  . Not on file   Social History Narrative  . No narrative on file   Depression screen [facility] 2/9 02/05/2017 01/08/2017 12/10/2016 09/10/2016 08/21/2016  Decreased Interest 0 0 0 0 0  Down, Depressed, Hopeless 0 0 0 0 0  PHQ - 2 Score 0 0 0 0 0  Altered sleeping - - - - -  Tired, decreased energy - - - - -  Change in appetite - - - - -  Feeling bad or failure about yourself  - - - - -  Trouble concentrating - - - - -  Moving  slowly or fidgety/restless - - - - -  Suicidal thoughts - - - - -  PHQ-9 Score - - - - -  Difficult doing work/chores - - - - -  Some recent data might be hidden    Review of Systems See  hpi    Objective:   Physical Exam  Constitutional: He is oriented to person, place, and time. He appears well-developed and well-nourished. No distress.  HENT:  Head: Normocephalic and atraumatic.  Eyes: Pupils are equal, round, and reactive to light. Conjunctivae are normal. No scleral icterus.  Neck: Normal range of motion. Neck supple. No thyromegaly present.  Cardiovascular: Normal rate, regular rhythm, normal heart sounds and intact distal pulses.   Pulmonary/Chest: Effort normal and breath sounds normal. No respiratory distress.  Musculoskeletal: He exhibits no edema.  Lymphadenopathy:    He has no cervical adenopathy.  Neurological: He is alert and oriented to person, place, and time.  Skin: Skin is warm and dry. He is not diaphoretic.  Psychiatric: He has a normal mood and affect. His behavior is normal.     BP 128/90 (BP Location: Right Arm, Patient Position: Sitting, Cuff Size: Large)   Pulse 79   Temp 97.9 F (36.6 C) (Oral)   Resp 18   Ht 6' 0.5" (1.842 m)   Wt (!) 326 lb (147.9 kg)   SpO2 94%   BMI 43.61 kg/m   Results for orders placed or performed in visit on 03/05/17  POCT glycosylated hemoglobin (Hb A1C)  Result Value Ref Range   Hemoglobin A1C 6.5     Assessment & Plan:  Bmp Pneumovax-23, flu - declines these today but will be willing to do in the future.   Recheck in 6-8 wks but will have pt go off of losartan and recheck in 3 wks from that instead.   1. Type 2 diabetes mellitus without complication, without long-term current use of insulin ([place]) - doing great on bydureon - has allowed pt to 1/2 the dose of lantus to 20u - cont bydureon and metformin.  I do think pt could come off insulin - esp now as he has had his psych meds changed - even though he reports recent  higher sugars they are well within goal - will cont to try to use meds that help w/ weight loss which is also needed and he has made great diet changes to support this though no recent changes in the scale so will try starting SGLT2 inh and wean off lantus - see AVS. Pt still has a lot of Humulin which he was using for mealtimes SSI initially and hasn't needed in mos so can always restart that SSI if cbgs spike when initially stopping lantus. I hope that coming off the insulin will enable him to loose some weight. Will cont to check cbgs before each meal - tid-qid.  2. AKI (acute kidney injury) ([place]) - prior baseline Cr 1.2-1.3 which pt did return to on the last day of his hospitalization for acute severe hyperglycemia with ARF - since then though Cr as reset to 1.4-1.5 w/ eGFR still 66-70 but today Cr up to 1.65 with eGFR of 60. Started on losartan 25 on 7/5 so not sure if this is contributing to the bump.  Consider having pt do temporary trial off of the losartan for a few wks and recheck renal fxn. . .   Neg/normal microalb/Cr ratio 12/18/16.  3. Obesity, Class III, BMI 40-49.9 (morbid obesity) ([place])  Wt Readings from Last 3 Encounters:  03/05/17 (!) 326 lb (147.9 kg)  02/05/17 (!) 323 lb (146.5 kg)  01/08/17 (!) 324 lb (147 kg)     4. Hypertriglyceridemia - dramatically improved with lifestyle change - still not on any med and lipids almost at goal  with HDL low and non-HDL a little high - start low dose statin.     Orders Placed This Encounter  Procedures  . Lipid panel    Order Specific Question:   Has the patient fasted?    Answer:   Yes  . Comprehensive metabolic panel    Order Specific Question:   Has the patient fasted?    Answer:   Yes  . POCT glycosylated hemoglobin (Hb A1C)    Meds ordered this encounter  Medications  . dapagliflozin propanediol (FARXIGA) 10 MG TABS tablet    Sig: Take 10 mg by mouth daily.    Dispense:  30 tablet    Refill:  5   [name], M.D.  Primary  Care at [facility] [place], [place] [address] [phone] phone [phone] fax  03/05/17 8:18 AM  Results for orders placed or performed in visit on 03/05/17  Lipid panel  Result Value Ref Range   Cholesterol, Total 142 100 - 199 mg/dL   Triglycerides 148 0 - 149 mg/dL   HDL 27 (L) >39 mg/dL   VLDL Cholesterol Cal 30 5 - 40 mg/dL   LDL Calculated 85 0 - 99 mg/dL   Chol/HDL Ratio 5.3 (H) 0.0 - 5.0 ratio  Comprehensive metabolic panel  Result Value Ref Range   Glucose 89 65 - 99 mg/dL   BUN 11 6 - 20 mg/dL   Creatinine, Ser 1.65 (H) 0.76 - 1.27 mg/dL   GFR calc non Af Amer 52 (L) >59 mL/min/1.73   GFR calc Af Amer 60 >59 mL/min/1.73   BUN/Creatinine Ratio 7 (L) 9 - 20   Sodium 143 134 - 144 mmol/L   Potassium 4.7 3.5 - 5.2 mmol/L   Chloride 103 96 - 106 mmol/L   CO2 22 20 - 29 mmol/L   Calcium 9.7 8.7 - 10.2 mg/dL   Total Protein 7.3 6.0 - 8.5 g/dL   Albumin 4.5 3.5 - 5.5 g/dL   Globulin, Total 2.8 1.5 - 4.5 g/dL   Albumin/Globulin Ratio 1.6 1.2 - 2.2   Bilirubin Total 0.2 0.0 - 1.2 mg/dL   Alkaline Phosphatase 54 39 - 117 IU/L   AST 25 0 - 40 IU/L   ALT 29 0 - 44 IU/L  POCT glycosylated hemoglobin (Hb A1C)  Result Value Ref Range   Hemoglobin A1C 6.5

## 2017-03-06 LAB — COMPREHENSIVE METABOLIC PANEL
ALBUMIN: 4.5 g/dL (ref 3.5–5.5)
ALK PHOS: 54 IU/L (ref 39–117)
ALT: 29 IU/L (ref 0–44)
AST: 25 IU/L (ref 0–40)
Albumin/Globulin Ratio: 1.6 (ref 1.2–2.2)
BILIRUBIN TOTAL: 0.2 mg/dL (ref 0.0–1.2)
BUN / CREAT RATIO: 7 — AB (ref 9–20)
BUN: 11 mg/dL (ref 6–20)
CHLORIDE: 103 mmol/L (ref 96–106)
CO2: 22 mmol/L (ref 20–29)
Calcium: 9.7 mg/dL (ref 8.7–10.2)
Creatinine, Ser: 1.65 mg/dL — ABNORMAL HIGH (ref 0.76–1.27)
GFR calc Af Amer: 60 mL/min/{1.73_m2} (ref >59)
GFR calc non Af Amer: 52 mL/min/{1.73_m2} — ABNORMAL LOW (ref >59)
GLOBULIN, TOTAL: 2.8 g/dL (ref 1.5–4.5)
GLUCOSE: 89 mg/dL (ref 65–99)
Potassium: 4.7 mmol/L (ref 3.5–5.2)
SODIUM: 143 mmol/L (ref 134–144)
Total Protein: 7.3 g/dL (ref 6.0–8.5)

## 2017-03-06 LAB — LIPID PANEL
CHOLESTEROL TOTAL: 142 mg/dL (ref 100–199)
Chol/HDL Ratio: 5.3 ratio — ABNORMAL HIGH (ref 0.0–5.0)
HDL: 27 mg/dL — AB (ref >39)
LDL Calculated: 85 mg/dL (ref 0–99)
TRIGLYCERIDES: 148 mg/dL (ref 0–149)
VLDL CHOLESTEROL CAL: 30 mg/dL (ref 5–40)

## 2017-03-08 MED ORDER — PRAVASTATIN SODIUM 20 MG PO TABS: 20.0000 mg | ORAL_TABLET | Freq: Every day | ORAL | 0 refills | Status: DC

## 2017-03-11 ENCOUNTER — Encounter: Payer: Self-pay | Admitting: Radiology

## 2017-03-31 ENCOUNTER — Ambulatory Visit (INDEPENDENT_AMBULATORY_CARE_PROVIDER_SITE_OTHER): Payer: Self-pay | Admitting: Physician Assistant

## 2017-03-31 ENCOUNTER — Encounter: Payer: Self-pay | Admitting: Physician Assistant

## 2017-03-31 VITALS — BP 120/92 | HR 88 | Temp 98.4°F | Resp 16 | Ht 71.0 in | Wt 323.8 lb

## 2017-03-31 DIAGNOSIS — M545 Low back pain, unspecified: Secondary | ICD-10-CM

## 2017-03-31 DIAGNOSIS — M62830 Muscle spasm of back: Secondary | ICD-10-CM

## 2017-03-31 MED ORDER — METHOCARBAMOL 500 MG PO TABS: 500.0000 mg | ORAL_TABLET | Freq: Four times a day (QID) | ORAL | 0 refills | Status: DC

## 2017-03-31 NOTE — Patient Instructions (Addendum)
Apply moist heat to your back 3-4 times a day for 20-30 min a time.  Massage your back to help loosen muscles.  See below for exercises/stretches. Do these once your pain is under control.  If you are not improving in 3-4 weeks, come back. We can refer you to physical therapy.  Robaxin is a muscle relaxer. Take this as directed up to 4 times daily.  Once your pain is under control resume your walking regimen for exercise - this is great for back pain.    Low Back Strain Rehab Ask your health care provider which exercises are safe for you. Do exercises exactly as told by your health care provider and adjust them as directed. It is normal to feel mild stretching, pulling, tightness, or discomfort as you do these exercises, but you should stop right away if you feel sudden pain or your pain gets worse. Do not begin these exercises until told by your health care provider. Stretching and range of motion exercises These exercises warm up your muscles and joints and improve the movement and flexibility of your back. These exercises also help to relieve pain, numbness, and tingling. Exercise A: Single knee to chest  1. Lie on your back on a firm surface with both legs straight. 2. Bend one of your knees. Use your hands to move your knee up toward your chest until you feel a gentle stretch in your lower back and buttock. ? Hold your leg in this position by holding onto the front of your knee. ? Keep your other leg as straight as possible. 3. Hold for __________ seconds. 4. Slowly return to the starting position. 5. Repeat with your other leg. Repeat __________ times. Complete this exercise __________ times a day. Exercise B: Prone extension on elbows  1. Lie on your abdomen on a firm surface. 2. Prop yourself up on your elbows. 3. Use your arms to help lift your chest up until you feel a gentle stretch in your abdomen and your lower back. ? This will place some of your body weight on your elbows.  If this is uncomfortable, try stacking pillows under your chest. ? Your hips should stay down, against the surface that you are lying on. Keep your hip and back muscles relaxed. 4. Hold for __________ seconds. 5. Slowly relax your upper body and return to the starting position. Repeat __________ times. Complete this exercise __________ times a day. Strengthening exercises These exercises build strength and endurance in your back. Endurance is the ability to use your muscles for a long time, even after they get tired. Exercise C: Pelvic tilt 1. Lie on your back on a firm surface. Bend your knees and keep your feet flat. 2. Tense your abdominal muscles. Tip your pelvis up toward the ceiling and flatten your lower back into the floor. ? To help with this exercise, you may place a small towel under your lower back and try to push your back into the towel. 3. Hold for __________ seconds. 4. Let your muscles relax completely before you repeat this exercise. Repeat __________ times. Complete this exercise __________ times a day. Exercise D: Alternating arm and leg raises  1. Get on your hands and knees on a firm surface. If you are on a hard floor, you may want to use padding to cushion your knees, such as an exercise mat. 2. Line up your arms and legs. Your hands should be below your shoulders, and your knees should be below your hips.  3. Lift your left leg behind you. At the same time, raise your right arm and straighten it in front of you. ? Do not lift your leg higher than your hip. ? Do not lift your arm higher than your shoulder. ? Keep your abdominal and back muscles tight. ? Keep your hips facing the ground. ? Do not arch your back. ? Keep your balance carefully, and do not hold your breath. 4. Hold for __________ seconds. 5. Slowly return to the starting position and repeat with your right leg and your left arm. Repeat __________ times. Complete this exercise __________times a  day. Exercise J: Single leg lower with bent knees 1. Lie on your back on a firm surface. 2. Tense your abdominal muscles and lift your feet off the floor, one foot at a time, so your knees and hips are bent in an "L" shape (at about 90 degrees). ? Your knees should be over your hips and your lower legs should be parallel to the floor. 3. Keeping your abdominal muscles tense and your knee bent, slowly lower one of your legs so your toe touches the ground. 4. Lift your leg back up to return to the starting position. ? Do not hold your breath. ? Do not let your back arch. Keep your back flat against the ground. 5. Repeat with your other leg. Repeat __________ times. Complete this exercise __________ times a day. Posture and body mechanics  Body mechanics refers to the movements and positions of your body while you do your daily activities. Posture is part of body mechanics. Good posture and healthy body mechanics can help to relieve stress in your body's tissues and joints. Good posture means that your spine is in its natural S-curve position (your spine is neutral), your shoulders are pulled back slightly, and your head is not tipped forward. The following are general guidelines for applying improved posture and body mechanics to your everyday activities. Standing   When standing, keep your spine neutral and your feet about hip-width apart. Keep a slight bend in your knees. Your ears, shoulders, and hips should line up.  When you do a task in which you stand in one place for a long time, place one foot up on a stable object that is 2-4 inches (5-10 cm) high, such as a footstool. This helps keep your spine neutral. Sitting   When sitting, keep your spine neutral and keep your feet flat on the floor. Use a footrest, if necessary, and keep your thighs parallel to the floor. Avoid rounding your shoulders, and avoid tilting your head forward.  When working at a desk or a computer, keep your desk at  a height where your hands are slightly lower than your elbows. Slide your chair under your desk so you are close enough to maintain good posture.  When working at a computer, place your monitor at a height where you are looking straight ahead and you do not have to tilt your head forward or downward to look at the screen. Resting   When lying down and resting, avoid positions that are most painful for you.  If you have pain with activities such as sitting, bending, stooping, or squatting (flexion-based activities), lie in a position in which your body does not bend very much. For example, avoid curling up on your side with your arms and knees near your chest (fetal position).  If you have pain with activities such as standing for a long time or reaching with  your arms (extension-based activities), lie with your spine in a neutral position and bend your knees slightly. Try the following positions: ? Lying on your side with a pillow between your knees. ? Lying on your back with a pillow under your knees. Lifting   When lifting objects, keep your feet at least shoulder-width apart and tighten your abdominal muscles.  Bend your knees and hips and keep your spine neutral. It is important to lift using the strength of your legs, not your back. Do not lock your knees straight out.  Always ask for help to lift heavy or awkward objects. This information is not intended to replace advice given to you by your health care provider. Make sure you discuss any questions you have with your health care provider. Document Released: 06/02/2005 Document Revised: 02/07/2016 Document Reviewed: 03/14/2015 Elsevier Interactive Patient Education  [employer].  Thank you for coming in today. I hope you feel we met your needs.  Feel free to call PCP if you have any questions or further requests.  Please consider signing up for MyChart if you do not already have it, as this is a great way to communicate with  me.  Best,  Whitney McVey, PA-C  IF you received an x-ray today, you will receive an invoice from [facility]. Please contact [facility] at [phone] with questions or concerns regarding your invoice.   IF you received labwork today, you will receive an invoice from [facility]. Please contact [facility] at [phone] with questions or concerns regarding your invoice.   Our billing staff will not be able to assist you with questions regarding bills from these companies.  You will be contacted with the lab results as soon as they are available. The fastest way to get your results is to activate your My Chart account. Instructions are located on the last page of this paperwork. If you have not heard from [place] regarding the results in 2 weeks, please contact this office.

## 2017-03-31 NOTE — Progress Notes (Signed)
Marc Long  MRN: [id number] DOB: 14-Aug-1975  PCP: [name], MD  Subjective:  Pt is a 41 year old male PMH bipolar disorder, AKI, DM, obesity, lithium use who presents to clinic for back pain x 3 days. Denies MOI. Pain is worse in the morning when he wakes up, gets better as the day goes on. Pain is made worse with movements like bending over or standing up from seated position. Denies urinary symptoms, flank pain, leg weakness, n/t, saddle paresthesia, loss of bowel or bladder function.  He has tried heat and Vicodin. He believes this helps, however he is asleep so he is unsure.   He recently started a walking routine at work for exercise.   Review of Systems  Gastrointestinal: Negative for constipation and diarrhea.  Genitourinary: Negative for dysuria, flank pain, frequency, hematuria and urgency.  Musculoskeletal: Positive for back pain and gait problem. Negative for neck pain and neck stiffness.  Neurological: Negative for weakness and numbness.  Psychiatric/Behavioral: Negative for sleep disturbance.    Patient Active Problem List   Diagnosis Date Noted  . Hypertriglyceridemia 02/05/2017  . DM (diabetes mellitus), type 2, uncontrolled ([place]) 12/12/2016  . Dehydration 12/12/2016  . Suicidal ideations 12/12/2016  . Bipolar disorder (manic depression) ([place]) 12/12/2016  . Hyperglycemia due to type 2 diabetes mellitus ([place]) 12/12/2016  . AKI (acute kidney injury) ([place]) 12/12/2016  . DKA, type 2, not at goal [facility]) 12/12/2016  . Obesity, Class III, BMI 40-49.9 (morbid obesity) ([place])   . Type 2 diabetes mellitus without complication, without long-term current use of insulin ([place]) 12/10/2016  . Lithium use 12/10/2016  . Moderate single current episode of major depressive disorder ([place]) 04/15/2016  . Pain in joint, shoulder region 04/14/2016  . Diverticulosis of large intestine without hemorrhage 11/07/2015  . Calculus of gallbladder without cholecystitis 11/07/2015    Current  Outpatient Prescriptions on File Prior to Visit  Medication Sig Dispense Refill  . ACCU-CHEK GUIDE test strip USE UP TO 4 TIMES EVERY DAY AS DIRECTED 200 each 0  . blood glucose meter kit and supplies Dispense based on patient and insurance preference. Use up to four times daily as directed. (FOR ICD-9 250.00, 250.01). 1 each 0  . Exenatide ER (BYDUREON BCISE) 2 MG/0.85ML AUIJ Inject 2 mg into the skin once a week. 4 pen 5  . Insulin Glargine (LANTUS SOLOSTAR) 100 UNIT/ML Solostar Pen Inject 20 Units into the skin daily. 10 mL 0  . Insulin Pen Needle (PEN NEEDLES 3/16") 31G X 5 MM MISC Use as per instructions 3 times a day. 200 each 11  . lamoTRIgine (LAMICTAL) 200 MG tablet TK 1 T PO QD  1  . lithium carbonate (LITHOBID) 300 MG CR tablet Take 3 tablets by mouth daily  1  . LORazepam (ATIVAN) 0.5 MG tablet Take 0.5 mg by mouth every 8 (eight) hours as needed for anxiety.     . metFORMIN (GLUCOPHAGE) 1000 MG tablet Take 1 tablet (1,000 mg total) by mouth 2 (two) times daily with a meal. 180 tablet 1  . Multiple Vitamins-Minerals (MULTIVITAMIN WITH MINERALS) tablet Take 1 tablet by mouth daily.    . polyethylene glycol (MIRALAX / GLYCOLAX) packet Take 17 g by mouth daily. 14 each 0  . Cariprazine HCl (VRAYLAR PO) Take by mouth.    . dapagliflozin propanediol (FARXIGA) 10 MG TABS tablet Take 10 mg by mouth daily. (Patient not taking: Reported on 03/31/2017) 30 tablet 5  . insulin lispro (HUMALOG KWIKPEN) 100  UNIT/ML KiwkPen Inject 0-0.15 mLs (0-15 Units total) into the skin 3 (three) times daily with meals. CBG < 70: eat or drink something sweet and recheck. (Patient not taking: Reported on 03/31/2017) 15 mL 2  . losartan (COZAAR) 25 MG tablet TAKE 1 TABLET(25 MG) BY MOUTH DAILY (Patient not taking: Reported on 03/31/2017) 90 tablet 1  . pravastatin (PRAVACHOL) 20 MG tablet Take 1 tablet (20 mg total) by mouth daily. (Patient not taking: Reported on 03/31/2017) 90 tablet 0   No current  facility-administered medications on file prior to visit.     Allergies  Allergen Reactions  . Bee Venom Swelling  . Keflex [Cephalexin]   . Relafen [Nabumetone]     Blood in stool  . Shellfish Allergy Nausea Only     Objective:  BP 126/90   Pulse 88   Temp 98.4 F (36.9 C) (Oral)   Resp 16   Ht '5\' 11"'$  (1.803 m)   Wt (!) 323 lb 12.8 oz (146.9 kg)   SpO2 96%   BMI 45.16 kg/m   Physical Exam  Constitutional: He is oriented to person, place, and time and well-developed, well-nourished, and in no distress. No distress.  obese  Musculoskeletal:       Lumbar back: He exhibits tenderness (b/l low back) and spasm. He exhibits normal range of motion, no bony tenderness, no deformity and no pain.  Neurological: He is alert and oriented to person, place, and time. GCS score is 15.  Skin: Skin is warm and dry.  Psychiatric: Mood, memory, affect and judgment normal.  Vitals reviewed.   Assessment and Plan :  1. Acute bilateral low back pain without sciatica 2. Muscle spasm of back - methocarbamol (ROBAXIN) 500 MG tablet; Take 1 tablet (500 mg total) by mouth 4 (four) times daily.  Dispense: 21 tablet; Refill: 0 - Encouraged heat, massage, stretching, hydration. Once pain is under control, resume walking routine at work. RTC in 3-4 weeks if no improvement, consider PT referral.   Mercer Pod, PA-C  [facility] [place] 03/31/2017 9:44 AM

## 2017-04-13 ENCOUNTER — Emergency Department (HOSPITAL_COMMUNITY)
Admission: EM | Admit: 2017-04-13 | Discharge: 2017-04-13 | Disposition: A | Discharge status: Home / Self Care | Payer: Managed Care, Other (non HMO) | Attending: Emergency Medicine | Admitting: Emergency Medicine

## 2017-04-13 ENCOUNTER — Encounter (HOSPITAL_COMMUNITY): Payer: Self-pay | Admitting: Obstetrics and Gynecology

## 2017-04-13 DIAGNOSIS — F314 Bipolar disorder, current episode depressed, severe, without psychotic features: Secondary | ICD-10-CM

## 2017-04-13 DIAGNOSIS — Z794 Long term (current) use of insulin: Secondary | ICD-10-CM | POA: Insufficient documentation

## 2017-04-13 DIAGNOSIS — Z79899 Other long term (current) drug therapy: Secondary | ICD-10-CM | POA: Insufficient documentation

## 2017-04-13 DIAGNOSIS — Z008 Encounter for other general examination: Secondary | ICD-10-CM | POA: Insufficient documentation

## 2017-04-13 DIAGNOSIS — R45851 Suicidal ideations: Secondary | ICD-10-CM | POA: Insufficient documentation

## 2017-04-13 DIAGNOSIS — F319 Bipolar disorder, unspecified: Secondary | ICD-10-CM | POA: Diagnosis present

## 2017-04-13 DIAGNOSIS — E119 Type 2 diabetes mellitus without complications: Secondary | ICD-10-CM | POA: Insufficient documentation

## 2017-04-13 LAB — COMPREHENSIVE METABOLIC PANEL
ALT: 33 U/L (ref 17–63)
ANION GAP: 8 (ref 5–15)
AST: 26 U/L (ref 15–41)
Albumin: 4.1 g/dL (ref 3.5–5.0)
Alkaline Phosphatase: 49 U/L (ref 38–126)
BUN: 10 mg/dL (ref 6–20)
CHLORIDE: 106 mmol/L (ref 101–111)
CO2: 25 mmol/L (ref 22–32)
CREATININE: 1.48 mg/dL — AB (ref 0.61–1.24)
Calcium: 9.3 mg/dL (ref 8.9–10.3)
GFR, EST NON AFRICAN AMERICAN: 58 mL/min — AB (ref >60)
Glucose, Bld: 90 mg/dL (ref 65–99)
POTASSIUM: 4.3 mmol/L (ref 3.5–5.1)
SODIUM: 139 mmol/L (ref 135–145)
Total Bilirubin: 0.4 mg/dL (ref 0.3–1.2)
Total Protein: 7.3 g/dL (ref 6.5–8.1)

## 2017-04-13 LAB — CBC WITH DIFFERENTIAL/PLATELET
BASOS PCT: 1 %
Basophils Absolute: 0.1 10*3/uL (ref 0.0–0.1)
EOS ABS: 0.7 10*3/uL (ref 0.0–0.7)
Eosinophils Relative: 8 %
HEMATOCRIT: 41.1 % (ref 39.0–52.0)
HEMOGLOBIN: 13.4 g/dL (ref 13.0–17.0)
LYMPHS ABS: 1.7 10*3/uL (ref 0.7–4.0)
Lymphocytes Relative: 18 %
MCH: 29.1 pg (ref 26.0–34.0)
MCHC: 32.6 g/dL (ref 30.0–36.0)
MCV: 89.2 fL (ref 78.0–100.0)
MONO ABS: 0.7 10*3/uL (ref 0.1–1.0)
MONOS PCT: 7 %
NEUTROS PCT: 66 %
Neutro Abs: 6.1 10*3/uL (ref 1.7–7.7)
Platelets: 354 10*3/uL (ref 150–400)
RBC: 4.61 MIL/uL (ref 4.22–5.81)
RDW: 14 % (ref 11.5–15.5)
WBC: 9.2 10*3/uL (ref 4.0–10.5)

## 2017-04-13 LAB — RAPID URINE DRUG SCREEN, HOSP PERFORMED
AMPHETAMINES: NOT DETECTED
Barbiturates: NOT DETECTED
Benzodiazepines: POSITIVE — AB
COCAINE: NOT DETECTED
OPIATES: NOT DETECTED
TETRAHYDROCANNABINOL: NOT DETECTED

## 2017-04-13 LAB — ETHANOL

## 2017-04-13 LAB — LITHIUM LEVEL: Lithium Lvl: 0.5 mmol/L — ABNORMAL LOW (ref 0.60–1.20)

## 2017-04-13 LAB — SALICYLATE LEVEL

## 2017-04-13 LAB — ACETAMINOPHEN LEVEL: Acetaminophen (Tylenol), Serum: <10 ug/mL — ABNORMAL LOW (ref 10–30)

## 2017-04-13 MED ORDER — METFORMIN HCL 500 MG PO TABS: 1000.0000 mg | ORAL_TABLET | Freq: Two times a day (BID) | ORAL | Status: DC

## 2017-04-13 MED ORDER — INSULIN ASPART 100 UNIT/ML ~~LOC~~ SOLN: 0.0000 [IU] | Freq: Three times a day (TID) | SUBCUTANEOUS | Status: DC

## 2017-04-13 MED ORDER — LAMOTRIGINE 100 MG PO TABS
200.0000 mg | ORAL_TABLET | Freq: Every day | ORAL | Status: DC
Start: 2017-04-13 — End: 2017-04-13

## 2017-04-13 MED ORDER — ACETAMINOPHEN 325 MG PO TABS: 650.0000 mg | ORAL_TABLET | ORAL | Status: DC | PRN

## 2017-04-13 MED ORDER — POLYETHYLENE GLYCOL 3350 17 G PO PACK: 17.0000 g | PACK | Freq: Every day | ORAL | Status: DC

## 2017-04-13 MED ORDER — ADULT MULTIVITAMIN W/MINERALS CH: 1.0000 | ORAL_TABLET | Freq: Every day | ORAL | Status: DC

## 2017-04-13 MED ORDER — INSULIN GLARGINE 100 UNIT/ML ~~LOC~~ SOLN: 20.0000 [IU] | Freq: Every day | SUBCUTANEOUS | Status: DC

## 2017-04-13 MED ORDER — CARIPRAZINE HCL 3 MG PO CAPS: 3.0000 mg | ORAL_CAPSULE | Freq: Every day | ORAL | Status: DC

## 2017-04-13 MED ORDER — LITHIUM CARBONATE ER 450 MG PO TBCR: 450.0000 mg | EXTENDED_RELEASE_TABLET | Freq: Two times a day (BID) | ORAL | Status: DC

## 2017-04-13 MED ORDER — ONDANSETRON HCL 4 MG PO TABS: 4.0000 mg | ORAL_TABLET | Freq: Three times a day (TID) | ORAL | Status: DC | PRN

## 2017-04-13 MED ORDER — EXENATIDE ER 2 MG/0.85ML ~~LOC~~ AUIJ: 2.0000 mg | AUTO-INJECTOR | SUBCUTANEOUS | Status: DC

## 2017-04-13 NOTE — ED Notes (Signed)
Bed: WA27 Expected date:  Expected time:  Means of arrival:  Comments: 41 yo m psych
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
## 2017-04-13 NOTE — ED Triage Notes (Signed)
Per EMS: For the last week pt has been having suicidal thoughts. Pt has had some feelings this week that are seemingly worse than usual. No self harm at this time. Pt reports that he has been taking his medicine.  Pt has a hx of bipolar and DM.  [employer] searched pt before they left.   Vitals: 140/90 Pulse 88 CBG 102 RR 20

## 2017-04-13 NOTE — ED Notes (Signed)
Pt's belongings including wallet, clothes, black bag, and jacket placed into locker 61. Pt dressed out into paper scrubs and wanded.

## 2017-04-13 NOTE — ED Notes (Signed)
Pt oriented to room and unit.  Pt is pleasant and cooperative.  Pt contracts for safety.  15 minute checks and video monitoring in place.

## 2017-04-13 NOTE — Progress Notes (Signed)
  Pt was seen by TTS and this Probation officer. Pt has been having increased suicidal thoughts and visions. Pt stated he is fully aware of what is going on and has no intention of acting on the thoughts. Pt is seen at [facility] for therapy and medication management. Pt's next appointment is on Wednesday 04-15-2017. Pt does not have access to weapons; Pt stated he has a knife for protection but has given this to his roommate for safety reasons. Pt lives with a roommate, Laqueta Carina at [phone]. Pt also gave his mother's phone number Hassan Rowan at [phone]. This Probation officer spoke with Pt's mother who stated either she or the roommate keep a constant watch over pt since his thoughts have increased. Pt's mother stated she will see that he gets to his appointment on Wednesday. This Probation officer educated regarding return to the emergency room if symptoms worsen or do not improve after his next appointment for therapy and medication management. Pt's mother verbalized understanding.    Ethelene Hal 04-13-2017   [id number]

## 2017-04-13 NOTE — ED Notes (Signed)
Patient stated he took all his home meds earlier.  Pt is being discharged with a follow up with his current provider on Wednesday.

## 2017-04-13 NOTE — [facility] Suicide Risk Assessment (Signed)
Suicide Risk Assessment  Discharge Assessment   [facility] Discharge Suicide Risk Assessment   Principal Problem: Bipolar disorder (manic depression) [facility]) Discharge Diagnoses:  Patient Active Problem List   Diagnosis Date Noted  . Hypertriglyceridemia [E78.1] 02/05/2017  . DM (diabetes mellitus), type 2, uncontrolled ([place]) [E11.65] 12/12/2016  . Dehydration [E86.0] 12/12/2016  . Suicidal ideations [R45.851] 12/12/2016  . Bipolar disorder (manic depression) ([place]) [F31.9] 12/12/2016  . Hyperglycemia due to type 2 diabetes mellitus ([place]) [E11.65] 12/12/2016  . AKI (acute kidney injury) ([place]) [N17.9] 12/12/2016  . DKA, type 2, not at goal [facility]) [E11.10] 12/12/2016  . Obesity, Class III, BMI 40-49.9 (morbid obesity) ([place]) [E66.01]   . Type 2 diabetes mellitus without complication, without long-term current use of insulin ([place]) [E11.9] 12/10/2016  . Lithium use [Z79.899] 12/10/2016  . Moderate single current episode of major depressive disorder ([place]) [F32.1] 04/15/2016  . Pain in joint, shoulder region [M25.519] 04/14/2016  . Diverticulosis of large intestine without hemorrhage [K57.30] 11/07/2015  . Calculus of gallbladder without cholecystitis [K80.20] 11/07/2015    Total Time spent with patient: 45 minutes  Musculoskeletal: Strength & Muscle Tone: within normal limits Gait & Station: normal Patient leans: N/A  Psychiatric Specialty Exam:   Blood pressure (P) 123/90, pulse (P) 74, temperature (P) 97.8 F (36.6 C), temperature source (P) Oral, resp. rate (P) 18, height 5\' 11"  (1.803 m), weight (!) 146.5 kg (323 lb), SpO2 (P) 99 %.Body mass index is 45.05 kg/m.  General Appearance: Casual  Eye Contact::  Good  Speech:  Clear and Coherent and Normal Rate409  Volume:  Normal  Mood:  Depressed  Affect:  Congruent and Depressed  Thought Process:  Coherent, Goal Directed and Linear  Orientation:  Full (Time, Place, and Person)  Thought Content:  Logical  Suicidal Thoughts:  Yes.   without intent/plan, has visions  Homicidal Thoughts:  No  Memory:  Immediate;   Good Recent;   Good Remote;   Fair  Judgement:  Fair  Insight:  Fair  Psychomotor Activity:  Increased  Concentration:  Good  Recall:  Good  Fund of Knowledge:Good  Language: Good  Akathisia:  No  Handed:  Right  AIMS (if indicated):     Assets:  Communication Skills Desire for Improvement Financial Resources/Insurance Housing Physical Health Resilience Social Support Vocational/Educational  Sleep:     Cognition: WNL  ADL's:  Intact   Mental Status Per Nursing Assessment::   On Admission:   Depressed with suicidal ideation  Demographic Factors:  Male, Low socioeconomic status and Unemployed  Loss Factors: Financial problems/change in socioeconomic status  Historical Factors: Impulsivity  Risk Reduction Factors:   Sense of responsibility to family, Employed, Living with another person, especially a relative, Positive social support and Positive therapeutic relationship  Continued Clinical Symptoms:  Bipolar Disorder:   Bipolar II Depression:   Impulsivity More than one psychiatric diagnosis  Cognitive Features That Contribute To Risk:  Closed-mindedness    Suicide Risk:  Mild:  Suicidal ideation of limited frequency, intensity, duration, and specificity.  There are no identifiable plans, no associated intent, mild dysphoria and related symptoms, good self-control (both objective and subjective assessment), few other risk factors, and identifiable protective factors, including available and accessible social support.    Plan Of Care/Follow-up recommendations:  Activity:  as tolerated Diet:  Heart Healthy  Ethelene Hal, NP 04/13/2017, 5:08 PM

## 2017-04-13 NOTE — ED Provider Notes (Signed)
[facility]-EMERGENCY DEPT Provider Note   CSN: [id number] Arrival date & time: 04/13/17  1258     History   Chief Complaint Chief Complaint  Patient presents with  . Psychiatric Evaluation  . Suicidal    HPI Marc Long is a 41 y.o. male with a history of bipolar disorder, suicidal ideation, diabetes who presents emergency department today for suicidal ideation.  Patient is present with his mother currently helps provide some history.  The patient states that since Thursday he has been having suicidal thoughts.  He states that anytime he passes by a knife, window or other sharp object he has feelings that he wants to cut himself and commit suicide.  He states that there is a hanging wire at work that he wants to hang himself from.  He has never tried to hurt himself in the past. Does note that he will repeatedly stabbed himself with a pen/pencil daily in order to make sure that he can still feel pain. He denies any HI.  He notes some sadness, depression.  He states no difficulty sleeping, irritability, anxiety, euphoria, hallucinations, delusions, racing thoughts or hearing voices. Patient notes that he has been taking his medication as prescribed. He does admit to [place] use today. No other alcohol or drug use. He denies any physical complaints at this time.  He denies any fever, chills, headache, chest pain, shortness of breath, abdominal pain, nausea/vomiting, urinary symptoms, leg swelling.  HPI  Past Medical History:  Diagnosis Date  . Depression   . Diabetes mellitus without complication (HCC)   . Gallstones   . Obesity, Class III, BMI 40-49.9 (morbid obesity) (HCC)     Patient Active Problem List   Diagnosis Date Noted  . Hypertriglyceridemia 02/05/2017  . DM (diabetes mellitus), type 2, uncontrolled (HCC) 12/12/2016  . Dehydration 12/12/2016  . Suicidal ideations 12/12/2016  . Bipolar disorder (manic depression) (HCC) 12/12/2016  . Hyperglycemia due  to type 2 diabetes mellitus (HCC) 12/12/2016  . AKI (acute kidney injury) (HCC) 12/12/2016  . DKA, type 2, not at goal [facility]) 12/12/2016  . Obesity, Class III, BMI 40-49.9 (morbid obesity) (HCC)   . Type 2 diabetes mellitus without complication, without long-term current use of insulin (HCC) 12/10/2016  . Lithium use 12/10/2016  . Moderate single current episode of major depressive disorder (HCC) 04/15/2016  . Pain in joint, shoulder region 04/14/2016  . Diverticulosis of large intestine without hemorrhage 11/07/2015  . Calculus of gallbladder without cholecystitis 11/07/2015    No past surgical history on file.     Home Medications    Prior to Admission medications   Medication Sig Start Date End Date Taking? Authorizing Provider  Cariprazine HCl (VRAYLAR PO) Take 3 mg by mouth.    Yes [provider]  Exenatide ER (BYDUREON BCISE) 2 MG/0.85ML AUIJ Inject 2 mg into the skin once a week. 02/05/17  Yes Sherren Mocha, MD  Insulin Glargine (LANTUS SOLOSTAR) 100 UNIT/ML Solostar Pen Inject 20 Units into the skin daily. 02/05/17  Yes Sherren Mocha, MD  insulin lispro (HUMALOG KWIKPEN) 100 UNIT/ML KiwkPen Inject 0-0.15 mLs (0-15 Units total) into the skin 3 (three) times daily with meals. CBG < 70: eat or drink something sweet and recheck. Patient taking differently: Inject 0-15 Units into the skin 3 (three) times daily with meals. ONLY USES IF BS ARE OVER 120  CBG < 70: eat or drink something sweet and recheck. 12/18/16  Yes Sherren Mocha, MD  lamoTRIgine (LAMICTAL)  200 MG tablet TAKE 200 MG BY MOUTH ONCE DAILY 11/25/16  Yes [provider]  lithium carbonate (LITHOBID) 300 MG CR tablet Take 3 (900 MG) tablets by mouth daily 11/25/16  Yes [provider]  LORazepam (ATIVAN) 0.5 MG tablet Take 0.5 mg by mouth every 8 (eight) hours as needed for anxiety.    Yes [provider]  metFORMIN (GLUCOPHAGE) 1000 MG tablet Take 1 tablet (1,000 mg total) by mouth 2 (two) times  daily with a meal. 12/18/16  Yes Sherren Mocha, MD  methocarbamol (ROBAXIN) 500 MG tablet Take 1 tablet (500 mg total) by mouth 4 (four) times daily. 03/31/17  Yes McVey, Madelaine Bhat, PA-C  Multiple Vitamins-Minerals (MULTIVITAMIN WITH MINERALS) tablet Take 1 tablet by mouth daily.   Yes [provider]  polyethylene glycol (MIRALAX / GLYCOLAX) packet Take 17 g by mouth daily. 12/16/16  Yes Hongalgi, Maximino Greenland, MD  ACCU-CHEK GUIDE test strip USE UP TO 4 TIMES EVERY DAY AS DIRECTED 02/24/17   Sherren Mocha, MD  blood glucose meter kit and supplies Dispense based on patient and insurance preference. Use up to four times daily as directed. (FOR ICD-9 250.00, 250.01). 12/15/16   Hongalgi, Maximino Greenland, MD  dapagliflozin propanediol (FARXIGA) 10 MG TABS tablet Take 10 mg by mouth daily. Patient not taking: Reported on 03/31/2017 03/05/17   Sherren Mocha, MD  Insulin Pen Needle (PEN NEEDLES 3/16") 31G X 5 MM MISC Use as per instructions 3 times a day. 12/18/16   Sherren Mocha, MD    Family History Family History  Problem Relation Age of Onset  . Hypertension Mother   . Depression Mother   . Heart attack Maternal Grandfather   . Lymphoma Maternal Grandmother   . Diabetes Maternal Aunt   . Cancer Maternal Aunt     Social History Social History  Substance Use Topics  . Smoking status: Never Smoker  . Smokeless tobacco: Never Used  . Alcohol use 0.0 oz/week     Comment: once every 2 weeks. Wine coolers     Allergies   Bee venom; Keflex [cephalexin]; Relafen [nabumetone]; and Shellfish allergy   Review of Systems Review of Systems  All other systems reviewed and are negative.    Physical Exam Updated Vital Signs BP 137/90 (BP Location: Left Arm)   Pulse 80   Temp 97.6 F (36.4 C) (Oral)   Resp 17   Ht 5\' 11"  (1.803 m)   Wt (!) 146.5 kg (323 lb)   SpO2 93%   BMI 45.05 kg/m   Physical Exam  Constitutional: He appears well-developed and well-nourished.  HENT:  Head: Normocephalic  and atraumatic.  Right Ear: External ear normal.  Left Ear: External ear normal.  Nose: Nose normal.  Mouth/Throat: Uvula is midline, oropharynx is clear and moist and mucous membranes are normal. No tonsillar exudate.  Eyes: Pupils are equal, round, and reactive to light. Right eye exhibits no discharge. Left eye exhibits no discharge. No scleral icterus.  Neck: Trachea normal. Neck supple. No spinous process tenderness present. No neck rigidity. Normal range of motion present.  Cardiovascular: Normal rate, regular rhythm and intact distal pulses.   No murmur heard. Pulses:      Radial pulses are 2+ on the right side, and 2+ on the left side.       Dorsalis pedis pulses are 2+ on the right side, and 2+ on the left side.       Posterior tibial pulses are  2+ on the right side, and 2+ on the left side.  No lower extremity swelling or edema. Calves symmetric in size bilaterally.  Pulmonary/Chest: Effort normal and breath sounds normal. He exhibits no tenderness.  Abdominal: Soft. Bowel sounds are normal. There is no tenderness. There is no rebound and no guarding.  Musculoskeletal: He exhibits no edema.  Lymphadenopathy:    He has no cervical adenopathy.  Neurological: He is alert.  Speech clear. Follows commands. No facial droop. PERRLA. EOM grossly intact. CN III-XII grossly intact. Grossly moves all extremities 4 without ataxia. Able and appropriate strength for age to upper and lower extremities bilaterally including grip strength.   Skin: Skin is warm and dry. No rash noted. He is not diaphoretic.  Psychiatric: He has a normal mood and affect.  Nursing note and vitals reviewed.    ED Treatments / Results  Labs (all labs ordered are listed, but only abnormal results are displayed) Labs Reviewed  COMPREHENSIVE METABOLIC PANEL  ETHANOL  RAPID URINE DRUG SCREEN, HOSP PERFORMED  CBC WITH DIFFERENTIAL/PLATELET  SALICYLATE LEVEL  ACETAMINOPHEN LEVEL    EKG  EKG  Interpretation None       Radiology No results found.  Procedures Procedures (including critical care time)  Medications Ordered in ED Medications - No data to display   Initial Impression / Assessment and Plan / ED Course  I have reviewed the triage vital signs and the nursing notes.  Pertinent labs & imaging results that were available during my care of the patient were reviewed by me and considered in my medical decision making (see chart for details).     Pt presents to the ED for medical clearance.  Pt is currently having SI. No HI ideations.The patients demeanor is dysphoric. The patient currently does not have any acute physical complaints and is in no acute distress. The patient was brought to ED by self an is voluntarily seeking placement/behavioral health assistance. Labs drawn. Home meds reordered. TTS consulted. Patient placed in psych hold.   TTS recommended safety plan and patient follow-up with current provider on Wednesday.  Final Clinical Impressions(s) / ED Diagnoses   Final diagnoses:  Bipolar disorder, current episode depressed, severe, without psychotic features ([place])  Encounter for medical clearance for patient hold    New Prescriptions New Prescriptions   No medications on file     Lorelle Gibbs 04/13/17 2108    Duffy Bruce, MD 04/14/17 1151

## 2017-04-13 NOTE — Progress Notes (Signed)
Patient ID: Marc Long, male   DOB: [birth year], 41 y.o.   MRN: [id number]  Addendum to previous progress note:  This case was discussed with Dr Dwyane Dee who recommends that Pt be discharged home with safety plan in effect.    Ethelene Hal 04-13-2017     [phone]

## 2017-04-13 NOTE — ED Notes (Signed)
Pt discharged safely with mother.  Pt was instructed to follow up with his current provider on Wednesday.  Pt contracted for safety.  All belongings were returned to pt.

## 2017-04-13 NOTE — Discharge Instructions (Signed)
You have an appointment on Wednesday 04-15-2017 at [facility], your current outpatient provider. Please be sure to keep this appoinment.  If your symptoms worsen please return to the emergency room or call [phone] for help.

## 2017-04-16 ENCOUNTER — Ambulatory Visit: Payer: Managed Care, Other (non HMO) | Admitting: Family Medicine

## 2017-09-02 ENCOUNTER — Other Ambulatory Visit: Payer: Self-pay | Admitting: Family Medicine

## 2017-09-02 IMAGING — CR DG ABDOMEN 1V
2 series · 2 of 2 positions shown · non-contrast
Comparison: None.

CLINICAL DATA: Abdominal pain

EXAM:
ABDOMEN - 1 VIEW

[AP (1 of 2)]
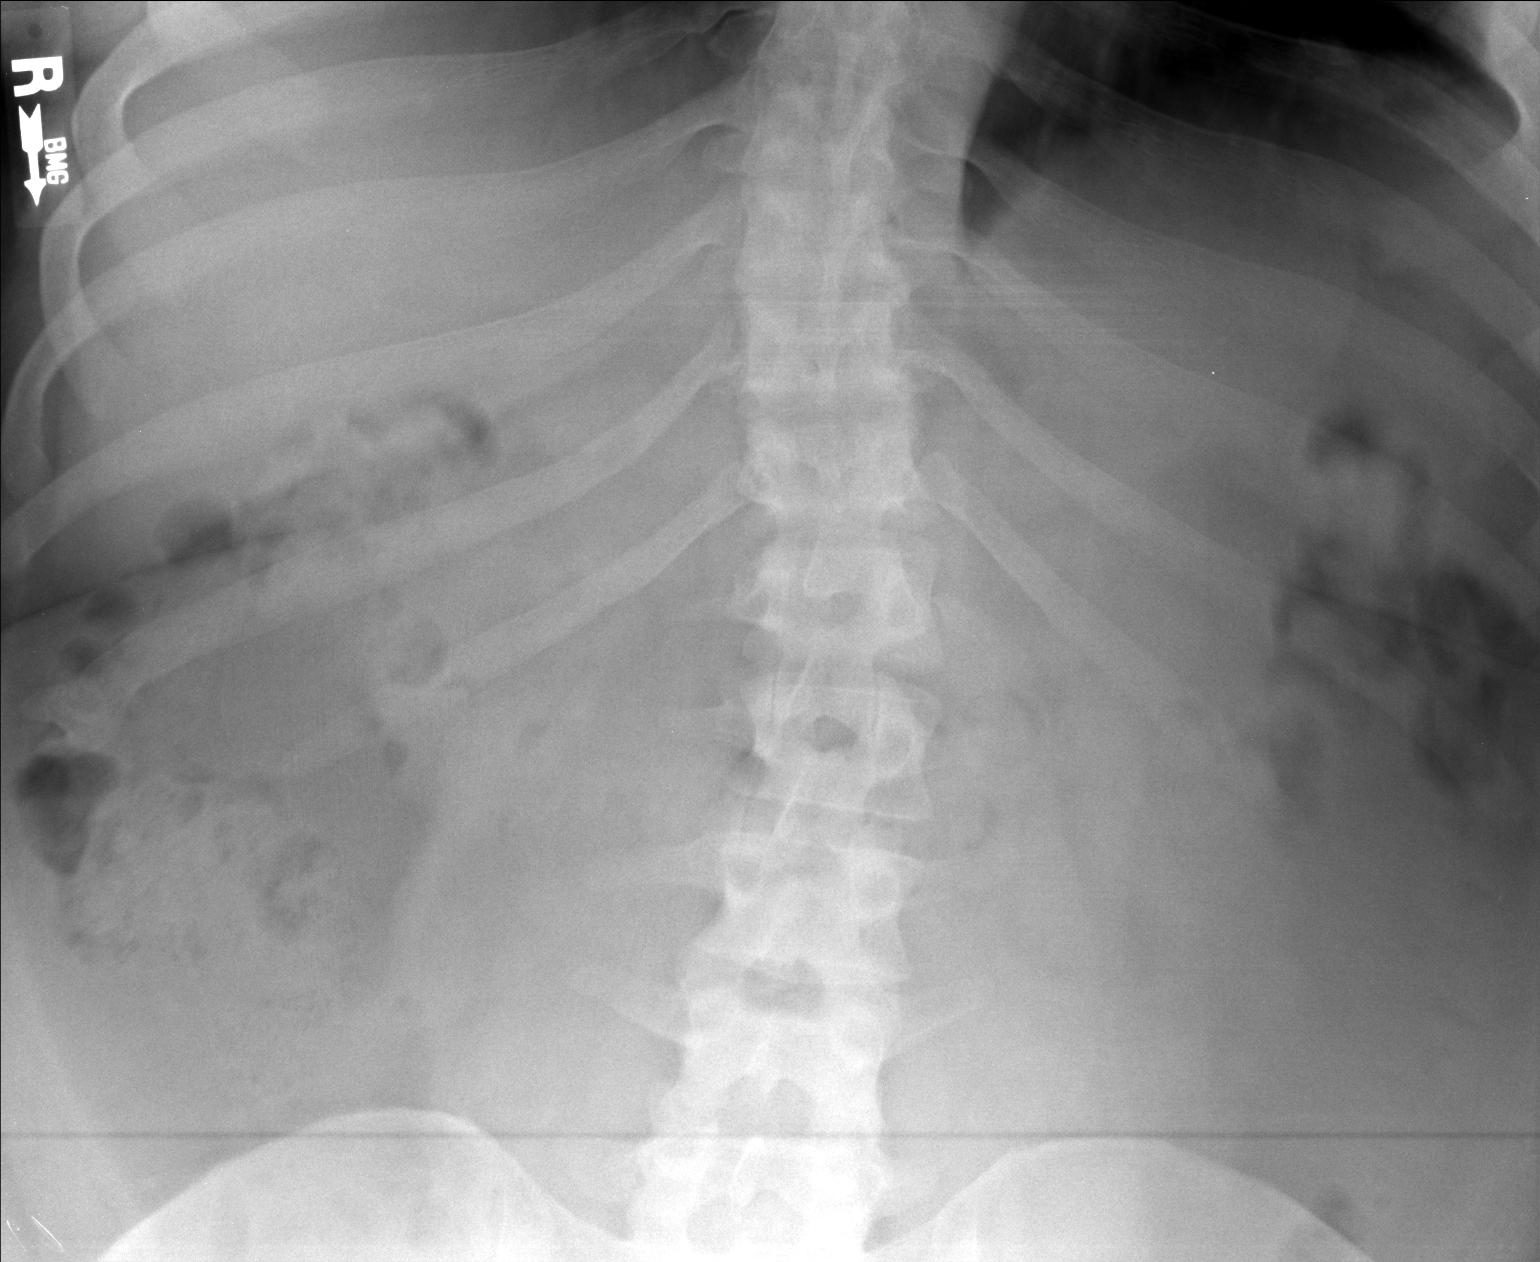

[AP (2 of 2)]
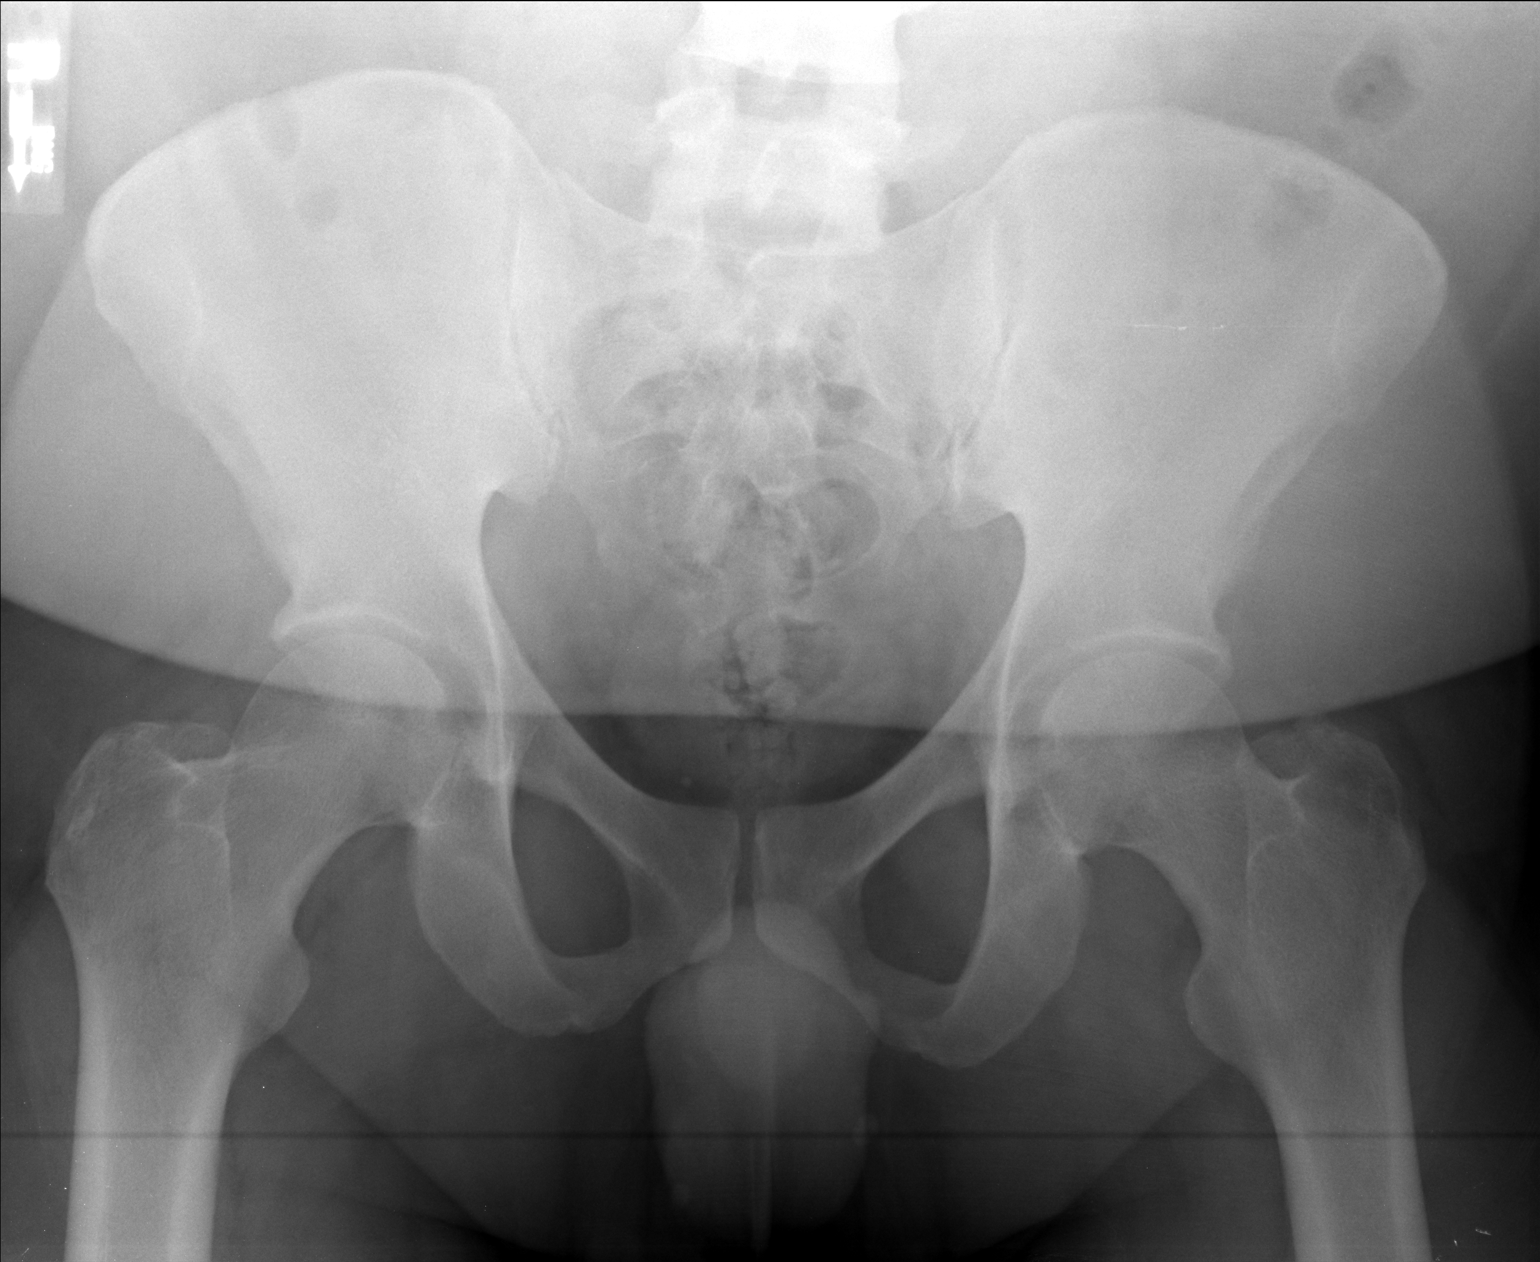

[2 of 2 positions shown; findings below may reference images not displayed]

FINDINGS: Nonobstructive bowel gas pattern.

Visualized osseous structures are within normal limits.
IMPRESSION: Unremarkable abdominal radiograph.

## 2017-09-03 NOTE — Telephone Encounter (Signed)
Lantus refill Last OV: 02/05/17 Last Refill:02/05/17 Pharmacy:[facility] [place] PCP: [name] MD

## 2017-09-05 IMAGING — CR DG KNEE 1-2V*R*
2 series · 2 of 2 positions shown · non-contrast
Comparison: None.

CLINICAL DATA: Pain over lateral joint line after jumping injury
several months ago.

EXAM:
RIGHT KNEE - 1-2 VIEW

[AP]
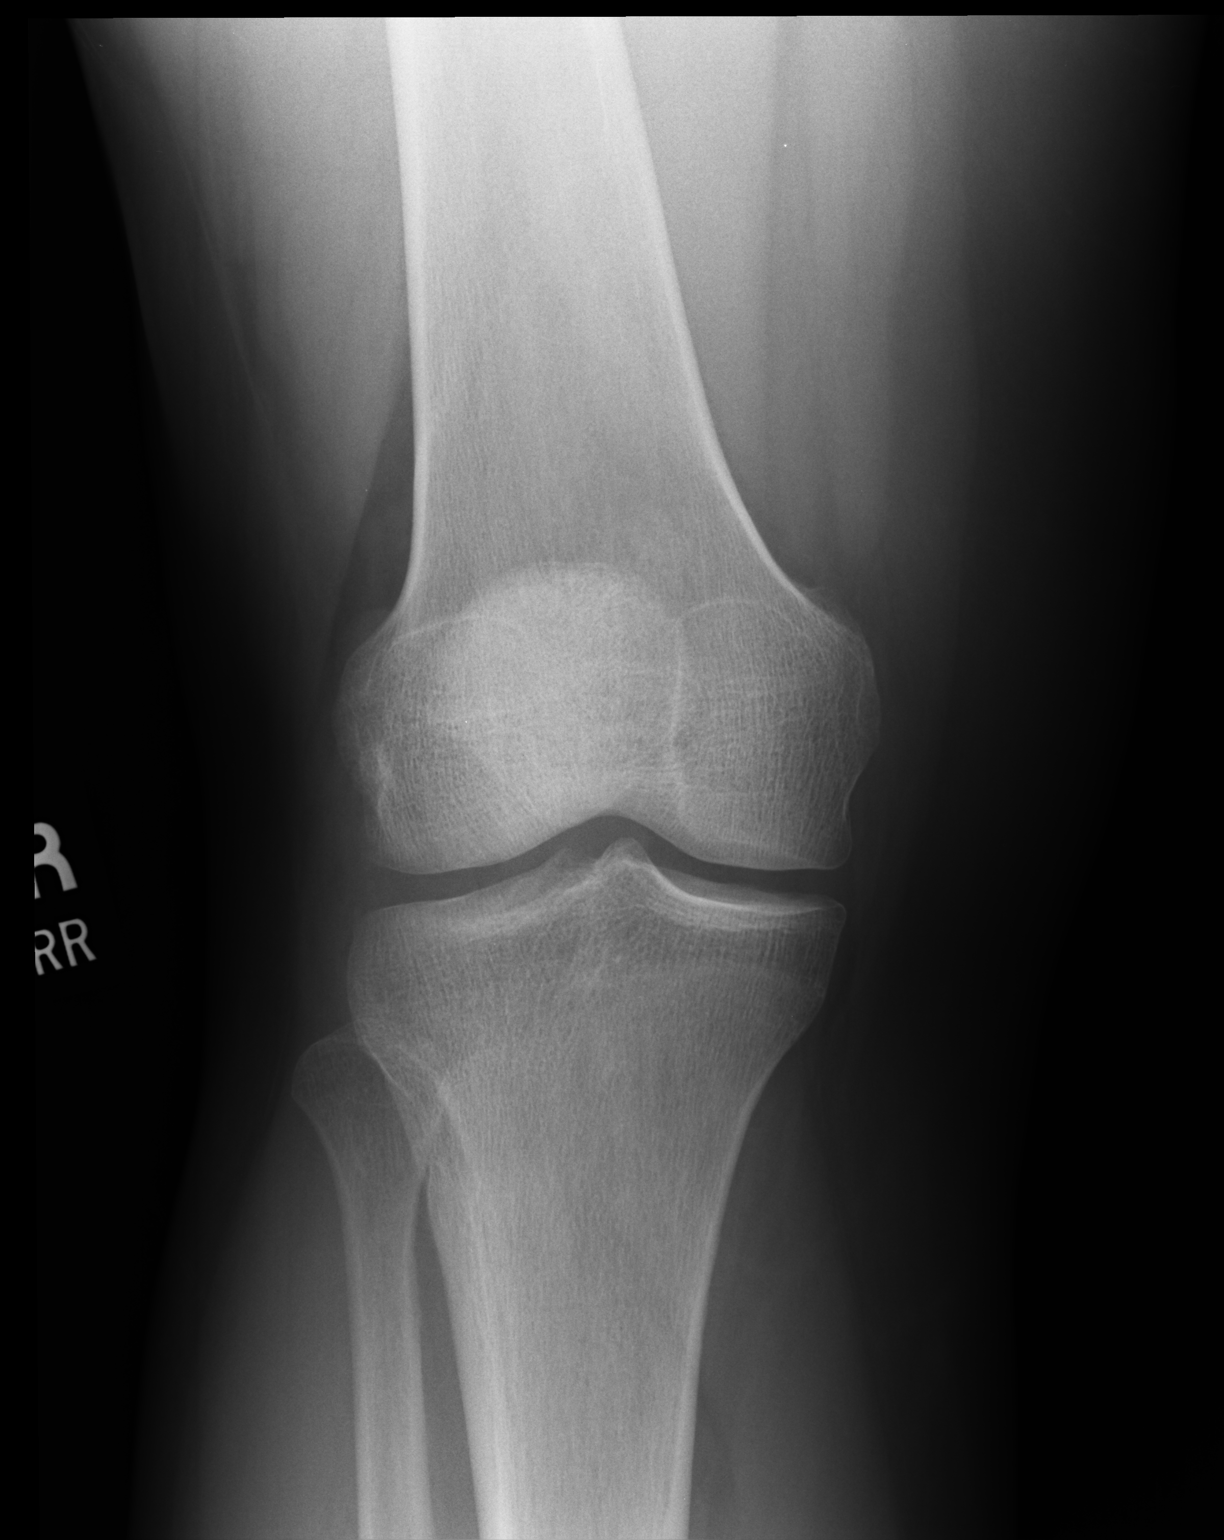

[lateral]
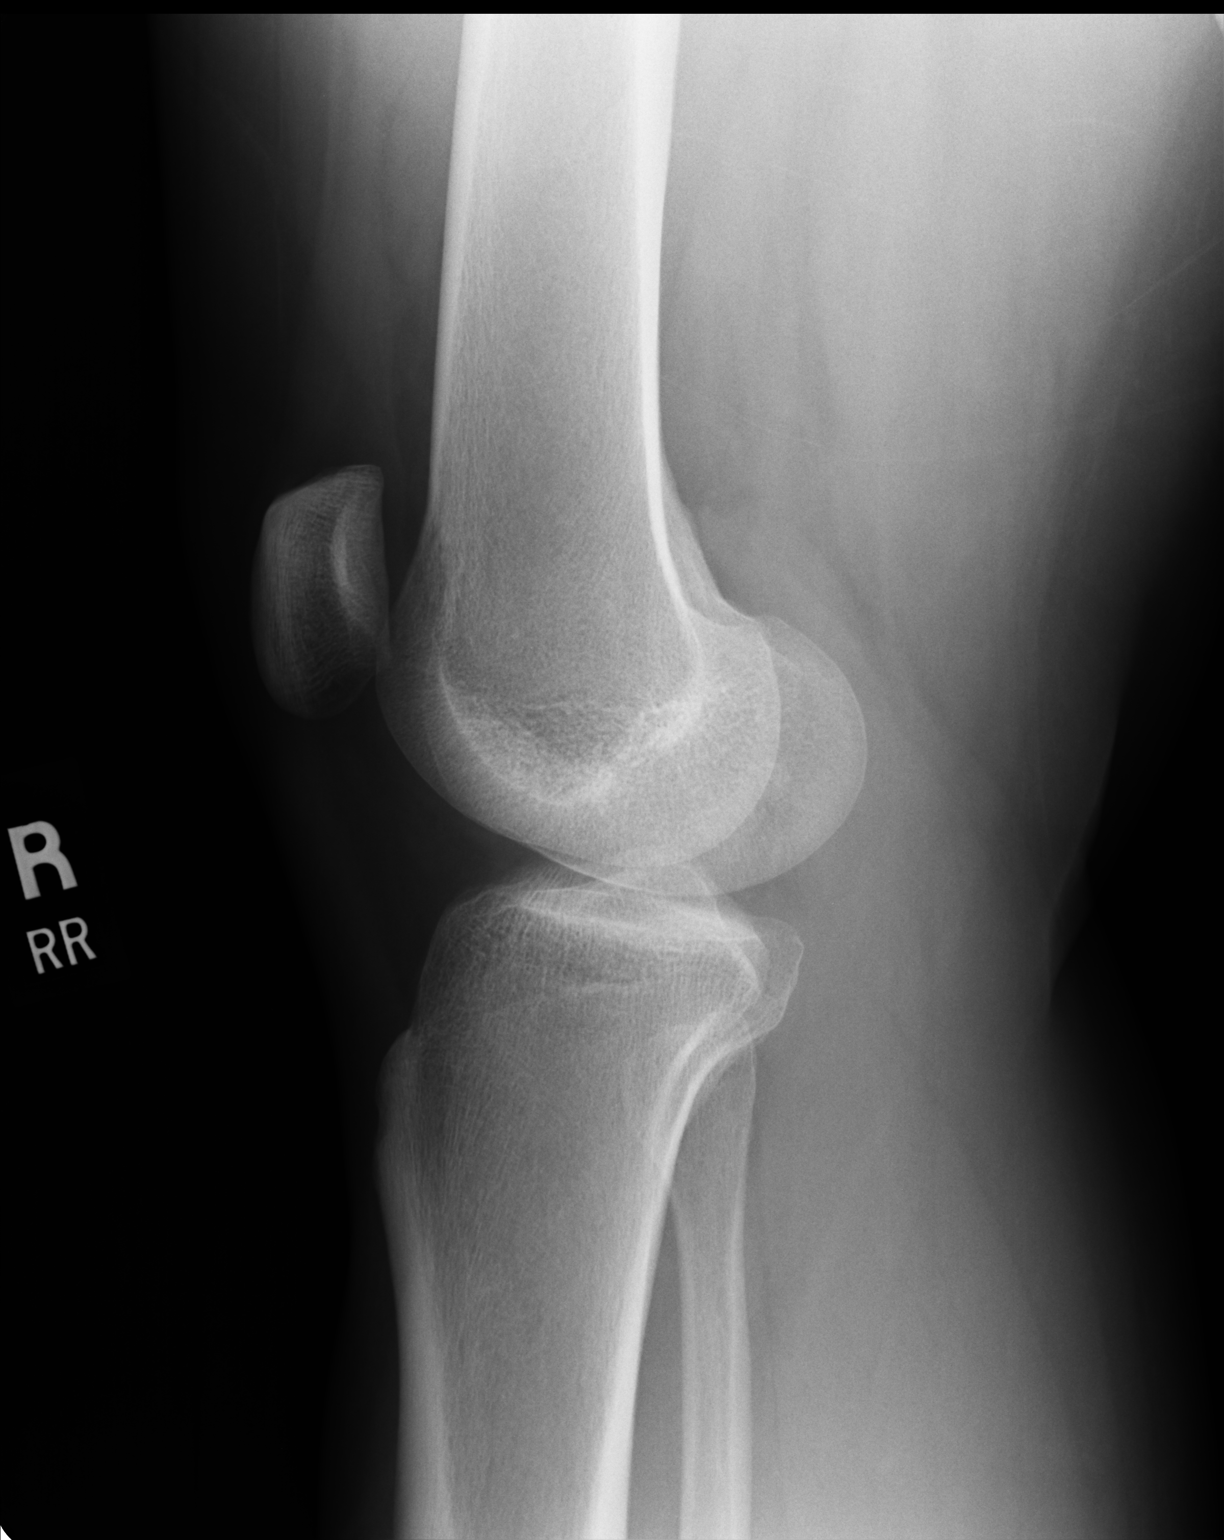

[2 of 2 positions shown; findings below may reference images not displayed]

FINDINGS: AP and lateral views. No acute fracture or dislocation. Joint spaces
are maintained for age. No joint effusion.
IMPRESSION: No acute osseous abnormality.

## 2017-09-05 IMAGING — CR DG ABDOMEN 1V
1 series · 1 of 1 positions shown · non-contrast
Comparison: Plain film of the abdomen dated 08/25/2015.

CLINICAL DATA: Follow-up for left lower quadrant abdominal pain.

EXAM:
ABDOMEN - 1 VIEW

[AP]
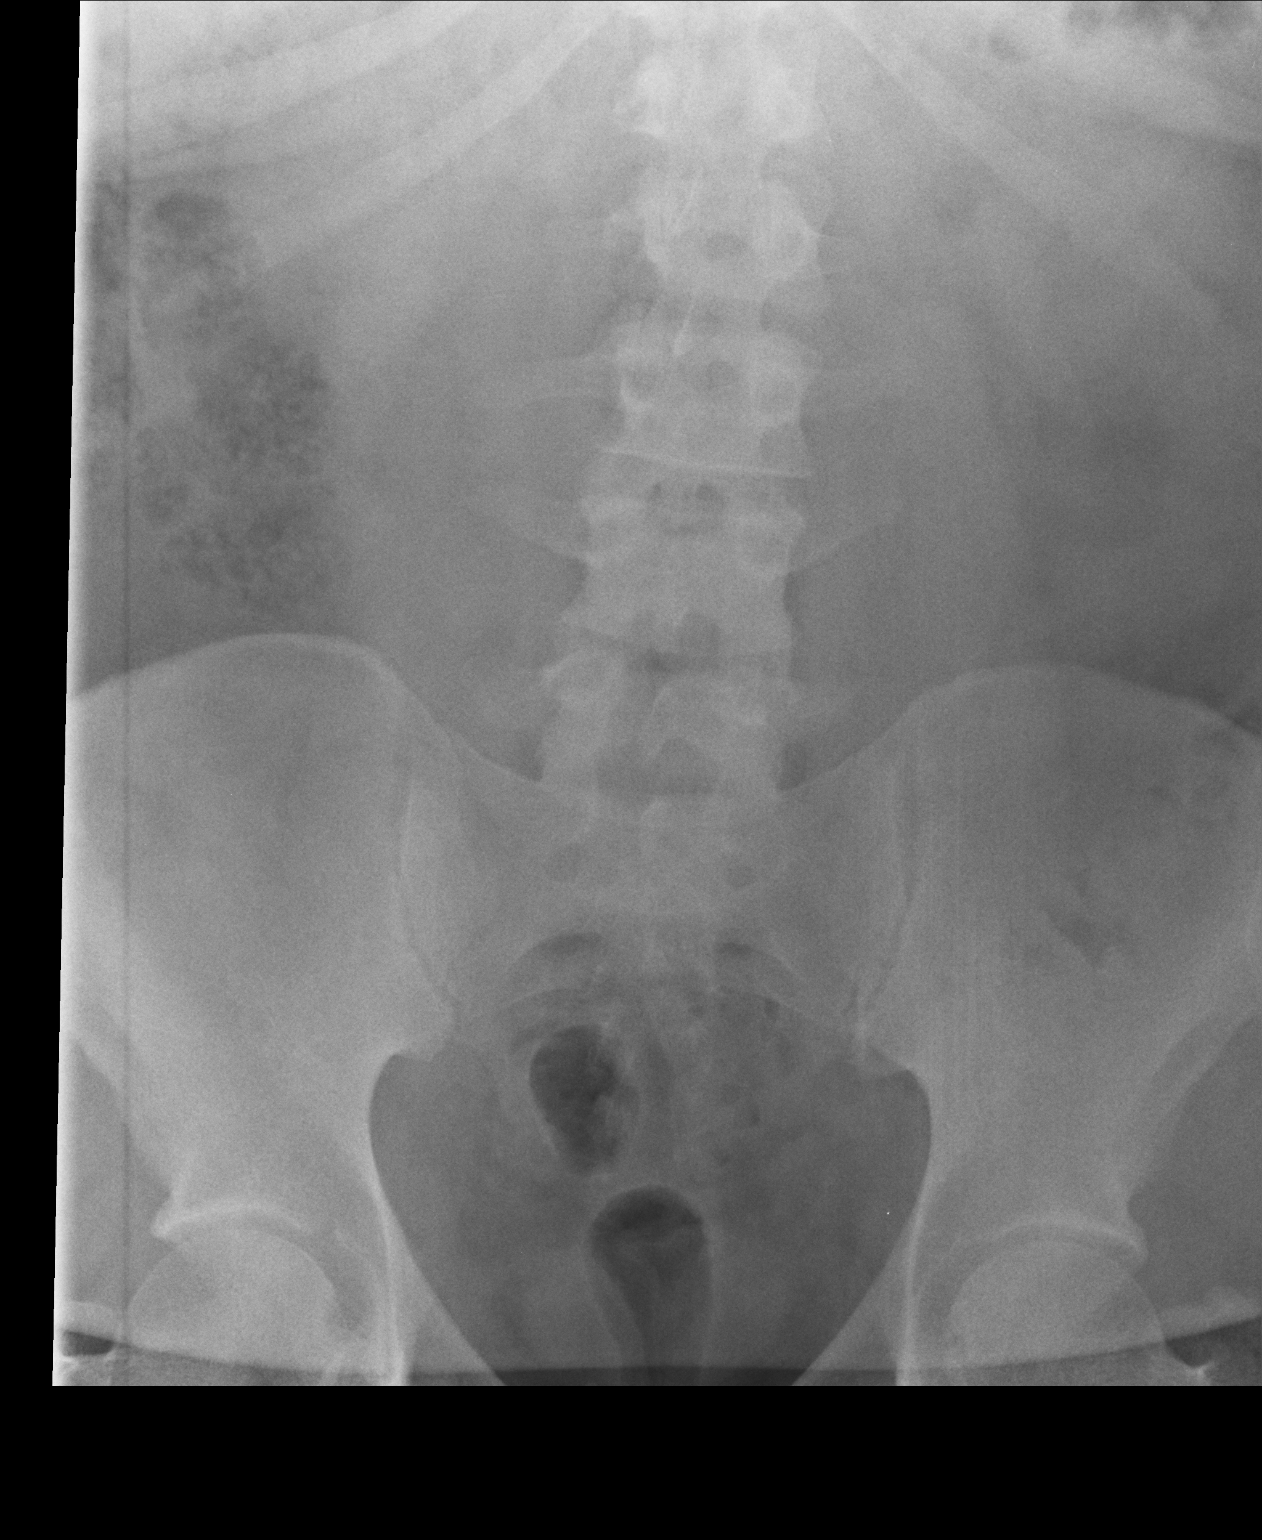

[1 of 1 positions shown; findings below may reference images not displayed]

FINDINGS: Visualized bowel gas pattern is nonobstructive. No evidence of soft
tissue mass or abnormal fluid collection. No evidence of free
intraperitoneal air. No pathologic- appearing calcifications seen.
Osseous structures are unremarkable.
IMPRESSION: Negative.

## 2017-09-06 NOTE — Telephone Encounter (Signed)
Pt is LONG overdue for an OV to f/u DM - please encourage him to schedule one ASAP - fasting would be ideal but is not mandatory if that is a barrier.

## 2017-09-07 NOTE — Telephone Encounter (Signed)
Called pt to inform him of the RX refill. Left VM per DPR. Explained that he would need to come in fo ran [place] for additional refills. Advised him to call the office and make an appt before he runs out of latest RX.

## 2017-10-28 ENCOUNTER — Other Ambulatory Visit: Payer: Self-pay | Admitting: Family Medicine

## 2017-10-28 DIAGNOSIS — E119 Type 2 diabetes mellitus without complications: Secondary | ICD-10-CM

## 2017-10-28 NOTE — Telephone Encounter (Signed)
Metformin 1000 mg refill request  LOV 03/05/17 with Dr. Brigitte Pulse  Needs appt.  [place], [place].

## 2017-12-05 ENCOUNTER — Encounter (HOSPITAL_COMMUNITY): Payer: Self-pay

## 2017-12-05 ENCOUNTER — Ambulatory Visit: Payer: Self-pay | Admitting: Emergency Medicine

## 2017-12-05 ENCOUNTER — Emergency Department (HOSPITAL_COMMUNITY): Payer: Medicaid Other

## 2017-12-05 ENCOUNTER — Inpatient Hospital Stay (HOSPITAL_COMMUNITY)
Admission: EM | Admit: 2017-12-05 | Discharge: 2017-12-10 | DRG: 637 | Disposition: A | Discharge status: Other Acute Inpatient Hospital | Payer: Self-pay | Attending: Internal Medicine | Admitting: Internal Medicine

## 2017-12-05 ENCOUNTER — Inpatient Hospital Stay (HOSPITAL_COMMUNITY): Payer: Medicaid Other

## 2017-12-05 ENCOUNTER — Other Ambulatory Visit: Payer: Self-pay

## 2017-12-05 DIAGNOSIS — Z818 Family history of other mental and behavioral disorders: Secondary | ICD-10-CM

## 2017-12-05 DIAGNOSIS — Z8249 Family history of ischemic heart disease and other diseases of the circulatory system: Secondary | ICD-10-CM

## 2017-12-05 DIAGNOSIS — F419 Anxiety disorder, unspecified: Secondary | ICD-10-CM | POA: Diagnosis present

## 2017-12-05 DIAGNOSIS — R45851 Suicidal ideations: Secondary | ICD-10-CM | POA: Diagnosis present

## 2017-12-05 DIAGNOSIS — Z888 Allergy status to other drugs, medicaments and biological substances status: Secondary | ICD-10-CM

## 2017-12-05 DIAGNOSIS — F314 Bipolar disorder, current episode depressed, severe, without psychotic features: Secondary | ICD-10-CM

## 2017-12-05 DIAGNOSIS — I10 Essential (primary) hypertension: Secondary | ICD-10-CM | POA: Diagnosis present

## 2017-12-05 DIAGNOSIS — Z91013 Allergy to seafood: Secondary | ICD-10-CM

## 2017-12-05 DIAGNOSIS — E860 Dehydration: Secondary | ICD-10-CM | POA: Diagnosis present

## 2017-12-05 DIAGNOSIS — E119 Type 2 diabetes mellitus without complications: Secondary | ICD-10-CM

## 2017-12-05 DIAGNOSIS — K3184 Gastroparesis: Secondary | ICD-10-CM | POA: Diagnosis present

## 2017-12-05 DIAGNOSIS — Z807 Family history of other malignant neoplasms of lymphoid, hematopoietic and related tissues: Secondary | ICD-10-CM

## 2017-12-05 DIAGNOSIS — F3130 Bipolar disorder, current episode depressed, mild or moderate severity, unspecified: Secondary | ICD-10-CM | POA: Diagnosis present

## 2017-12-05 DIAGNOSIS — N179 Acute kidney failure, unspecified: Secondary | ICD-10-CM | POA: Diagnosis present

## 2017-12-05 DIAGNOSIS — D649 Anemia, unspecified: Secondary | ICD-10-CM | POA: Diagnosis present

## 2017-12-05 DIAGNOSIS — Z6834 Body mass index (BMI) 34.0-34.9, adult: Secondary | ICD-10-CM

## 2017-12-05 DIAGNOSIS — Z794 Long term (current) use of insulin: Secondary | ICD-10-CM

## 2017-12-05 DIAGNOSIS — F319 Bipolar disorder, unspecified: Secondary | ICD-10-CM | POA: Diagnosis present

## 2017-12-05 DIAGNOSIS — K219 Gastro-esophageal reflux disease without esophagitis: Secondary | ICD-10-CM | POA: Diagnosis present

## 2017-12-05 DIAGNOSIS — Z91030 Bee allergy status: Secondary | ICD-10-CM

## 2017-12-05 DIAGNOSIS — J9600 Acute respiratory failure, unspecified whether with hypoxia or hypercapnia: Secondary | ICD-10-CM | POA: Diagnosis present

## 2017-12-05 DIAGNOSIS — Z833 Family history of diabetes mellitus: Secondary | ICD-10-CM

## 2017-12-05 DIAGNOSIS — G4710 Hypersomnia, unspecified: Secondary | ICD-10-CM | POA: Diagnosis present

## 2017-12-05 DIAGNOSIS — G2581 Restless legs syndrome: Secondary | ICD-10-CM | POA: Diagnosis present

## 2017-12-05 DIAGNOSIS — E669 Obesity, unspecified: Secondary | ICD-10-CM

## 2017-12-05 DIAGNOSIS — G9341 Metabolic encephalopathy: Secondary | ICD-10-CM | POA: Diagnosis present

## 2017-12-05 DIAGNOSIS — E1110 Type 2 diabetes mellitus with ketoacidosis without coma: Secondary | ICD-10-CM

## 2017-12-05 DIAGNOSIS — E1010 Type 1 diabetes mellitus with ketoacidosis without coma: Principal | ICD-10-CM

## 2017-12-05 DIAGNOSIS — E10319 Type 1 diabetes mellitus with unspecified diabetic retinopathy without macular edema: Secondary | ICD-10-CM | POA: Diagnosis present

## 2017-12-05 DIAGNOSIS — R0602 Shortness of breath: Secondary | ICD-10-CM

## 2017-12-05 DIAGNOSIS — E1043 Type 1 diabetes mellitus with diabetic autonomic (poly)neuropathy: Secondary | ICD-10-CM | POA: Diagnosis present

## 2017-12-05 DIAGNOSIS — E876 Hypokalemia: Secondary | ICD-10-CM | POA: Diagnosis present

## 2017-12-05 DIAGNOSIS — D696 Thrombocytopenia, unspecified: Secondary | ICD-10-CM | POA: Diagnosis present

## 2017-12-05 DIAGNOSIS — Z881 Allergy status to other antibiotic agents status: Secondary | ICD-10-CM

## 2017-12-05 DIAGNOSIS — R0682 Tachypnea, not elsewhere classified: Secondary | ICD-10-CM

## 2017-12-05 DIAGNOSIS — K5900 Constipation, unspecified: Secondary | ICD-10-CM | POA: Diagnosis not present

## 2017-12-05 DIAGNOSIS — T383X6A Underdosing of insulin and oral hypoglycemic [antidiabetic] drugs, initial encounter: Secondary | ICD-10-CM | POA: Diagnosis present

## 2017-12-05 DIAGNOSIS — Z23 Encounter for immunization: Secondary | ICD-10-CM

## 2017-12-05 LAB — BASIC METABOLIC PANEL
ANION GAP: 25 — AB (ref 5–15)
ANION GAP: 21 — AB (ref 5–15)
ANION GAP: 15 (ref 5–15)
ANION GAP: 16 — AB (ref 5–15)
Anion gap: 13 (ref 5–15)
BUN: 14 mg/dL (ref 6–20)
BUN: 14 mg/dL (ref 6–20)
BUN: 12 mg/dL (ref 6–20)
BUN: 13 mg/dL (ref 6–20)
BUN: 13 mg/dL (ref 6–20)
CALCIUM: 8.4 mg/dL — AB (ref 8.9–10.3)
CALCIUM: 8.7 mg/dL — AB (ref 8.9–10.3)
CALCIUM: 8.6 mg/dL — AB (ref 8.9–10.3)
CALCIUM: 8.8 mg/dL — AB (ref 8.9–10.3)
CALCIUM: 8.9 mg/dL (ref 8.9–10.3)
CO2: 7 mmol/L — ABNORMAL LOW (ref 22–32)
CO2: 11 mmol/L — ABNORMAL LOW (ref 22–32)
CO2: 15 mmol/L — ABNORMAL LOW (ref 22–32)
CO2: 14 mmol/L — ABNORMAL LOW (ref 22–32)
CO2: 17 mmol/L — ABNORMAL LOW (ref 22–32)
Chloride: 110 mmol/L (ref 101–111)
Chloride: 114 mmol/L — ABNORMAL HIGH (ref 101–111)
Chloride: 117 mmol/L — ABNORMAL HIGH (ref 101–111)
Chloride: 115 mmol/L — ABNORMAL HIGH (ref 101–111)
Chloride: 115 mmol/L — ABNORMAL HIGH (ref 101–111)
Creatinine, Ser: 1.56 mg/dL — ABNORMAL HIGH (ref 0.61–1.24)
Creatinine, Ser: 1.45 mg/dL — ABNORMAL HIGH (ref 0.61–1.24)
Creatinine, Ser: 1.21 mg/dL (ref 0.61–1.24)
Creatinine, Ser: 1.28 mg/dL — ABNORMAL HIGH (ref 0.61–1.24)
Creatinine, Ser: 1.32 mg/dL — ABNORMAL HIGH (ref 0.61–1.24)
GFR calc Af Amer: >60 mL/min (ref >60)
GFR calc Af Amer: >60 mL/min (ref >60)
GFR calc Af Amer: >60 mL/min (ref >60)
GFR calc Af Amer: >60 mL/min (ref >60)
GFR calc non Af Amer: 59 mL/min — ABNORMAL LOW (ref >60)
GFR calc non Af Amer: >60 mL/min (ref >60)
GFR calc non Af Amer: >60 mL/min (ref >60)
GFR calc non Af Amer: >60 mL/min (ref >60)
GFR, EST NON AFRICAN AMERICAN: 54 mL/min — AB (ref >60)
GLUCOSE: 125 mg/dL — AB (ref 65–99)
GLUCOSE: 184 mg/dL — AB (ref 65–99)
GLUCOSE: 195 mg/dL — AB (ref 65–99)
Glucose, Bld: 358 mg/dL — ABNORMAL HIGH (ref 65–99)
Glucose, Bld: 221 mg/dL — ABNORMAL HIGH (ref 65–99)
POTASSIUM: 4.3 mmol/L (ref 3.5–5.1)
POTASSIUM: 3.7 mmol/L (ref 3.5–5.1)
POTASSIUM: 3.2 mmol/L — AB (ref 3.5–5.1)
POTASSIUM: 3.2 mmol/L — AB (ref 3.5–5.1)
POTASSIUM: 3.1 mmol/L — AB (ref 3.5–5.1)
SODIUM: 142 mmol/L (ref 135–145)
SODIUM: 146 mmol/L — AB (ref 135–145)
SODIUM: 147 mmol/L — AB (ref 135–145)
SODIUM: 145 mmol/L (ref 135–145)
SODIUM: 145 mmol/L (ref 135–145)

## 2017-12-05 LAB — CBC WITH DIFFERENTIAL/PLATELET
BASOS PCT: 2 %
Band Neutrophils: 5 %
Basophils Absolute: 0.3 10*3/uL — ABNORMAL HIGH (ref 0.0–0.1)
EOS PCT: 0 %
Eosinophils Absolute: 0 10*3/uL (ref 0.0–0.7)
HEMATOCRIT: 48.1 % (ref 39.0–52.0)
HEMOGLOBIN: 16.1 g/dL (ref 13.0–17.0)
Lymphocytes Relative: 15 %
Lymphs Abs: 2.3 10*3/uL (ref 0.7–4.0)
MCH: 29.9 pg (ref 26.0–34.0)
MCHC: 33.5 g/dL (ref 30.0–36.0)
MCV: 89.4 fL (ref 78.0–100.0)
MONOS PCT: 0 %
Monocytes Absolute: 0 10*3/uL — ABNORMAL LOW (ref 0.1–1.0)
NEUTROS PCT: 78 %
Neutro Abs: 13 10*3/uL — ABNORMAL HIGH (ref 1.7–7.7)
Platelets: 342 10*3/uL (ref 150–400)
RBC: 5.38 MIL/uL (ref 4.22–5.81)
RDW: 15.4 % (ref 11.5–15.5)
WBC: 15.6 10*3/uL — AB (ref 4.0–10.5)

## 2017-12-05 LAB — URINALYSIS, ROUTINE W REFLEX MICROSCOPIC
BACTERIA UA: NONE SEEN
Bilirubin Urine: NEGATIVE
Glucose, UA: >=500 mg/dL — AB
Ketones, ur: 80 mg/dL — AB
Leukocytes, UA: NEGATIVE
Nitrite: NEGATIVE
Protein, ur: 100 mg/dL — AB
SPECIFIC GRAVITY, URINE: 1.018 (ref 1.005–1.030)
pH: 5 (ref 5.0–8.0)

## 2017-12-05 LAB — COMPREHENSIVE METABOLIC PANEL
ALBUMIN: 4.2 g/dL (ref 3.5–5.0)
ALT: 27 U/L (ref 17–63)
AST: 24 U/L (ref 15–41)
Alkaline Phosphatase: 101 U/L (ref 38–126)
Anion gap: 24 — ABNORMAL HIGH (ref 5–15)
BILIRUBIN TOTAL: 1.1 mg/dL (ref 0.3–1.2)
BUN: 13 mg/dL (ref 6–20)
CO2: 11 mmol/L — ABNORMAL LOW (ref 22–32)
CREATININE: 1.78 mg/dL — AB (ref 0.61–1.24)
Calcium: 9.1 mg/dL (ref 8.9–10.3)
Chloride: 102 mmol/L (ref 101–111)
GFR calc Af Amer: 53 mL/min — ABNORMAL LOW (ref >60)
GFR calc non Af Amer: 46 mL/min — ABNORMAL LOW (ref >60)
Glucose, Bld: 481 mg/dL — ABNORMAL HIGH (ref 65–99)
POTASSIUM: 3.9 mmol/L (ref 3.5–5.1)
Sodium: 137 mmol/L (ref 135–145)
TOTAL PROTEIN: 8.1 g/dL (ref 6.5–8.1)

## 2017-12-05 LAB — PROCALCITONIN: Procalcitonin: 0.38 ng/mL

## 2017-12-05 LAB — GLUCOSE, CAPILLARY
GLUCOSE-CAPILLARY: 235 mg/dL — AB (ref 65–99)
GLUCOSE-CAPILLARY: 199 mg/dL — AB (ref 65–99)
GLUCOSE-CAPILLARY: 150 mg/dL — AB (ref 65–99)
GLUCOSE-CAPILLARY: 126 mg/dL — AB (ref 65–99)
GLUCOSE-CAPILLARY: 157 mg/dL — AB (ref 65–99)
GLUCOSE-CAPILLARY: 176 mg/dL — AB (ref 65–99)
GLUCOSE-CAPILLARY: 156 mg/dL — AB (ref 65–99)
Glucose-Capillary: 291 mg/dL — ABNORMAL HIGH (ref 65–99)
Glucose-Capillary: 118 mg/dL — ABNORMAL HIGH (ref 65–99)
Glucose-Capillary: 108 mg/dL — ABNORMAL HIGH (ref 65–99)
Glucose-Capillary: 131 mg/dL — ABNORMAL HIGH (ref 65–99)
Glucose-Capillary: 163 mg/dL — ABNORMAL HIGH (ref 65–99)
Glucose-Capillary: 167 mg/dL — ABNORMAL HIGH (ref 65–99)
Glucose-Capillary: 167 mg/dL — ABNORMAL HIGH (ref 65–99)
Glucose-Capillary: 183 mg/dL — ABNORMAL HIGH (ref 65–99)
Glucose-Capillary: 166 mg/dL — ABNORMAL HIGH (ref 65–99)

## 2017-12-05 LAB — CBG MONITORING, ED
GLUCOSE-CAPILLARY: 419 mg/dL — AB (ref 65–99)
GLUCOSE-CAPILLARY: 381 mg/dL — AB (ref 65–99)
Glucose-Capillary: 483 mg/dL — ABNORMAL HIGH (ref 65–99)
Glucose-Capillary: 334 mg/dL — ABNORMAL HIGH (ref 65–99)

## 2017-12-05 LAB — PHOSPHORUS: Phosphorus: 5.2 mg/dL — ABNORMAL HIGH (ref 2.5–4.6)

## 2017-12-05 LAB — I-STAT CG4 LACTIC ACID, ED: Lactic Acid, Venous: 4.69 mmol/L (ref 0.5–1.9)

## 2017-12-05 LAB — BLOOD GAS, VENOUS
Acid-base deficit: 26.9 mmol/L — ABNORMAL HIGH (ref 0.0–2.0)
BICARBONATE: 5.3 mmol/L — AB (ref 20.0–28.0)
FIO2: 21
O2 SAT: 66.3 %
PATIENT TEMPERATURE: 97.3
PO2 VEN: 44.3 mmHg (ref 32.0–45.0)
pCO2, Ven: 22.8 mmHg — ABNORMAL LOW (ref 44.0–60.0)
pH, Ven: 6.989 — CL (ref 7.250–7.430)

## 2017-12-05 LAB — MRSA PCR SCREENING: MRSA by PCR: NEGATIVE

## 2017-12-05 LAB — LACTIC ACID, PLASMA
LACTIC ACID, VENOUS: 3.9 mmol/L — AB (ref 0.5–1.9)
Lactic Acid, Venous: 3.8 mmol/L (ref 0.5–1.9)

## 2017-12-05 LAB — BETA-HYDROXYBUTYRIC ACID

## 2017-12-05 LAB — I-STAT TROPONIN, ED: Troponin i, poc: 0 ng/mL (ref 0.00–0.08)

## 2017-12-05 LAB — LITHIUM LEVEL: Lithium Lvl: <0.06 mmol/L — ABNORMAL LOW (ref 0.60–1.20)

## 2017-12-05 LAB — MAGNESIUM: Magnesium: 2.3 mg/dL (ref 1.7–2.4)

## 2017-12-05 LAB — BRAIN NATRIURETIC PEPTIDE: B Natriuretic Peptide: 60.5 pg/mL (ref 0.0–100.0)

## 2017-12-05 MED ORDER — ALBUTEROL SULFATE (2.5 MG/3ML) 0.083% IN NEBU: 5.0000 mg | INHALATION_SOLUTION | Freq: Once | RESPIRATORY_TRACT | Status: DC

## 2017-12-05 MED ORDER — STERILE WATER FOR INJECTION IV SOLN
INTRAVENOUS | Status: DC
  Administered 2017-12-05: 06:00:00 via INTRAVENOUS
  Filled 2017-12-05 (×3): qty 850

## 2017-12-05 MED ORDER — SODIUM CHLORIDE 0.9 % IV SOLN: INTRAVENOUS | Status: DC

## 2017-12-05 MED ORDER — SODIUM CHLORIDE 0.9 % IV BOLUS
1000.0000 mL | Freq: Once | INTRAVENOUS | Status: AC
  Administered 2017-12-05: 1000 mL via INTRAVENOUS

## 2017-12-05 MED ORDER — DEXTROSE-NACL 5-0.45 % IV SOLN
INTRAVENOUS | Status: DC
  Administered 2017-12-05: 09:00:00 via INTRAVENOUS
  Administered 2017-12-05: 75 mL via INTRAVENOUS

## 2017-12-05 MED ORDER — SODIUM BICARBONATE 8.4 % IV SOLN
50.0000 meq | Freq: Once | INTRAVENOUS | Status: AC
  Administered 2017-12-05: 50 meq via INTRAVENOUS

## 2017-12-05 MED ORDER — SODIUM BICARBONATE 8.4 % IV SOLN
50.0000 meq | Freq: Once | INTRAVENOUS | Status: AC
  Administered 2017-12-05: 50 meq via INTRAVENOUS
  Filled 2017-12-05: qty 50

## 2017-12-05 MED ORDER — RACEPINEPHRINE HCL 2.25 % IN NEBU
0.5000 mL | INHALATION_SOLUTION | Freq: Once | RESPIRATORY_TRACT | Status: AC
  Administered 2017-12-05: 0.5 mL via RESPIRATORY_TRACT
  Filled 2017-12-05: qty 0.5

## 2017-12-05 MED ORDER — FAMOTIDINE IN NACL 20-0.9 MG/50ML-% IV SOLN
20.0000 mg | Freq: Two times a day (BID) | INTRAVENOUS | Status: DC
  Administered 2017-12-05: 20 mg via INTRAVENOUS
  Filled 2017-12-05 (×2): qty 50

## 2017-12-05 MED ORDER — SODIUM CHLORIDE 0.9 % IV SOLN
INTRAVENOUS | Status: DC
  Administered 2017-12-06: 11:00:00 via INTRAVENOUS
  Administered 2017-12-07: 50 mL/h via INTRAVENOUS

## 2017-12-05 MED ORDER — PNEUMOCOCCAL VAC POLYVALENT 25 MCG/0.5ML IJ INJ
0.5000 mL | INJECTION | INTRAMUSCULAR | Status: AC
  Administered 2017-12-06: 0.5 mL via INTRAMUSCULAR
  Filled 2017-12-05: qty 0.5

## 2017-12-05 MED ORDER — SODIUM CHLORIDE 0.9 % IV SOLN
INTRAVENOUS | Status: DC
  Administered 2017-12-05: 11 [IU]/h via INTRAVENOUS
  Administered 2017-12-05: 9.6 [IU]/h via INTRAVENOUS
  Administered 2017-12-05: 4.2 [IU]/h via INTRAVENOUS
  Administered 2017-12-05: 7.2 [IU]/h via INTRAVENOUS
  Administered 2017-12-05: 2.9 [IU]/h via INTRAVENOUS
  Filled 2017-12-05 (×2): qty 1

## 2017-12-05 MED ORDER — MUPIROCIN 2 % EX OINT: 1.0000 "application " | TOPICAL_OINTMENT | Freq: Two times a day (BID) | CUTANEOUS | Status: DC

## 2017-12-05 MED ORDER — SODIUM BICARBONATE 8.4 % IV SOLN
INTRAVENOUS | Status: AC
  Administered 2017-12-05: 50 meq via INTRAVENOUS
  Filled 2017-12-05: qty 100

## 2017-12-05 MED ORDER — CHLORHEXIDINE GLUCONATE CLOTH 2 % EX PADS
6.0000 | MEDICATED_PAD | Freq: Every day | CUTANEOUS | Status: DC
  Administered 2017-12-05: 6 via TOPICAL

## 2017-12-05 MED ORDER — HEPARIN SODIUM (PORCINE) 5000 UNIT/ML IJ SOLN
5000.0000 [IU] | Freq: Three times a day (TID) | INTRAMUSCULAR | Status: DC
  Administered 2017-12-05 – 2017-12-10 (×16): 5000 [IU] via SUBCUTANEOUS
  Filled 2017-12-05 (×16): qty 1

## 2017-12-05 NOTE — Progress Notes (Signed)
VBG results RBV to Dr. Florina Ou on 12/05/17 at [place] by this writer.  RBV attached to VBG during entry of results.

## 2017-12-05 NOTE — ED Notes (Signed)
ED TO INPATIENT HANDOFF REPORT  Name/Age/Gender Marc Long 42 y.o. male  Code Status    Code Status Orders  (From admission, onward)        Start     Ordered   12/05/17 0603  Full code  Continuous     12/05/17 0605    Code Status History    Date Active Date Inactive Code Status Order ID Comments User Context   04/13/2017 1422 04/13/2017 2118 Full Code [id number]  Lorelle Gibbs ED   12/12/2016 1523 12/15/2016 2020 Full Code [id number]  Modena Jansky, MD Inpatient   12/12/2016 1452 12/12/2016 1523 Full Code [id number]  Modena Jansky, MD Inpatient      Home/SNF/Other Home  Chief Complaint Shortness of Breath  Level of Care/Admitting Diagnosis ED Disposition    ED Disposition Condition [place]: [facility] [[id number]]  Level of Care: ICU [6]  Diagnosis: DKA (diabetic ketoacidoses) ([place]) [240973]  Admitting Physician: Collene Gobble [[id number]]  Attending Physician: Collene Gobble [[id number]]  Estimated length of stay: 3 - 4 days  Certification:: I certify this patient will need inpatient services for at least 2 midnights  PT Class (Do Not Modify): Inpatient [101]  PT Acc Code (Do Not Modify): Private [1]       Medical History Past Medical History:  Diagnosis Date  . Depression   . Diabetes mellitus without complication ([place])   . Gallstones   . Obesity, Class III, BMI 40-49.9 (morbid obesity) (HCC)     Allergies Allergies  Allergen Reactions  . Bee Venom Swelling  . Keflex [Cephalexin]     UPSET STOMACH  . Relafen [Nabumetone]     Blood in stool  . Shellfish Allergy Nausea Only    IV Location/Drains/Wounds Patient Lines/Drains/Airways Status   Active Line/Drains/Airways    Name:   Placement date:   Placement time:   Site:   Days:   Peripheral IV 12/05/17 Right Antecubital   12/05/17    0255    Antecubital   less than 1          Labs/Imaging Results for orders placed or performed during the hospital  encounter of 12/05/17 (from the past 48 hour(s))  POC CBG, ED     Status: Abnormal   Collection Time: 12/05/17  2:29 AM  Result Value Ref Range   Glucose-Capillary 483 (H) 65 - 99 mg/dL  Blood gas, venous     Status: Abnormal (Preliminary result)   Collection Time: 12/05/17  2:35 AM  Result Value Ref Range   FIO2 21.00    O2 Content PENDING L/min   pH, Ven 6.949 (LL) 7.250 - 7.430    Comment: RBV 21    pCO2, Ven 26.8 (L) 44.0 - 60.0 mmHg   pO2, Ven 48.9 (H) 32.0 - 45.0 mmHg   Bicarbonate 5.6 (L) 20.0 - 28.0 mmol/L   Acid-base deficit 27.5 (H) 0.0 - 2.0 mmol/L   O2 Saturation 66.7 %   Patient temperature 98.6    Collection site VEIN    Drawn by [phone]    Sample type VEIN     Comment: Performed at [facility], [place] [address], [place], [place] [address]  CBC with Differential     Status: Abnormal   Collection Time: 12/05/17  2:43 AM  Result Value Ref Range   WBC 15.6 (H) 4.0 - 10.5 K/uL   RBC 5.38 4.22 - 5.81 MIL/uL   Hemoglobin 16.1  13.0 - 17.0 g/dL   HCT 48.1 39.0 - 52.0 %   MCV 89.4 78.0 - 100.0 fL   MCH 29.9 26.0 - 34.0 pg   MCHC 33.5 30.0 - 36.0 g/dL   RDW 15.4 11.5 - 15.5 %   Platelets 342 150 - 400 K/uL   Neutrophils Relative % 78 %   Lymphocytes Relative 15 %   Monocytes Relative 0 %   Eosinophils Relative 0 %   Basophils Relative 2 %   Band Neutrophils 5 %   Neutro Abs 13.0 (H) 1.7 - 7.7 K/uL   Lymphs Abs 2.3 0.7 - 4.0 K/uL   Monocytes Absolute 0.0 (L) 0.1 - 1.0 K/uL   Eosinophils Absolute 0.0 0.0 - 0.7 K/uL   Basophils Absolute 0.3 (H) 0.0 - 0.1 K/uL   WBC Morphology TOXIC GRANULATION     Comment: Performed at [facility], [place] [address], [place], [place] [address]  Comprehensive metabolic panel     Status: Abnormal   Collection Time: 12/05/17  2:43 AM  Result Value Ref Range   Sodium 137 135 - 145 mmol/L   Potassium 3.9 3.5 - 5.1 mmol/L   Chloride 102 101 - 111 mmol/L   CO2 11 (L) 22 - 32 mmol/L    Comment:  REPEATED TO VERIFY   Glucose, Bld 481 (H) 65 - 99 mg/dL   BUN 13 6 - 20 mg/dL   Creatinine, Ser 1.78 (H) 0.61 - 1.24 mg/dL   Calcium 9.1 8.9 - 10.3 mg/dL   Total Protein 8.1 6.5 - 8.1 g/dL   Albumin 4.2 3.5 - 5.0 g/dL   AST 24 15 - 41 U/L   ALT 27 17 - 63 U/L   Alkaline Phosphatase 101 38 - 126 U/L   Total Bilirubin 1.1 0.3 - 1.2 mg/dL   GFR calc non Af Amer 46 (L) >60 mL/min   GFR calc Af Amer 53 (L) >60 mL/min    Comment: (NOTE) The eGFR has been calculated using the CKD EPI equation. This calculation has not been validated in all clinical situations. eGFR's persistently <60 mL/min signify possible Chronic Kidney Disease.    Anion gap 24 (H) 5 - 15    Comment: REPEATED TO VERIFY Performed at [facility], [place] [address], [place], [place] [address]   Brain natriuretic peptide     Status: None   Collection Time: 12/05/17  2:44 AM  Result Value Ref Range   B Natriuretic Peptide 60.5 0.0 - 100.0 pg/mL    Comment: Performed at [facility], [place] [address] [place], [place] [address]  Lithium level     Status: Abnormal   Collection Time: 12/05/17  2:44 AM  Result Value Ref Range   Lithium Lvl <0.06 (L) 0.60 - 1.20 mmol/L    Comment: Performed at [facility], [place] [address], [place], [place] [address]  I-Stat Troponin, ED (not at [facility])     Status: None   Collection Time: 12/05/17  2:53 AM  Result Value Ref Range   Troponin i, poc 0.00 0.00 - 0.08 ng/mL   Comment 3            Comment: Due to the release kinetics of cTnI, a negative result within the first hours of the onset of symptoms does not rule out myocardial infarction with certainty. If myocardial infarction is still suspected, repeat the test at appropriate intervals.   I-Stat CG4 Lactic Acid, ED     Status:  Abnormal   Collection Time: 12/05/17  2:53 AM  Result Value Ref Range   Lactic Acid, Venous 4.69 (HH) 0.5 - 1.9 mmol/L   Comment NOTIFIED PHYSICIAN   CBG  monitoring, ED     Status: Abnormal   Collection Time: 12/05/17  4:20 AM  Result Value Ref Range   Glucose-Capillary 419 (H) 65 - 99 mg/dL  Blood gas, venous     Status: Abnormal   Collection Time: 12/05/17  4:46 AM  Result Value Ref Range   FIO2 21.00    pH, Ven 6.989 (LL) 7.250 - 7.430    Comment: CRITICAL RESULT CALLED TO, READ BACK BY AND VERIFIED WITH: Shanon Rosser, MD AT [phone] ON 12/05/2017 BY Danella Sensing, RRT, RCP    pCO2, Ven 22.8 (L) 44.0 - 60.0 mmHg   pO2, Ven 44.3 32.0 - 45.0 mmHg   Bicarbonate 5.3 (L) 20.0 - 28.0 mmol/L   Acid-base deficit 26.9 (H) 0.0 - 2.0 mmol/L   O2 Saturation 66.3 %   Patient temperature 97.3    Collection site VEIN    Drawn by DRAWN BY RN    Sample type VENOUS     Comment: Performed at [facility], [place] [address], [place], [place] [address]  Urinalysis, Routine w reflex microscopic     Status: Abnormal   Collection Time: 12/05/17  5:12 AM  Result Value Ref Range   Color, Urine YELLOW YELLOW   APPearance CLEAR CLEAR   Specific Gravity, Urine 1.018 1.005 - 1.030   pH 5.0 5.0 - 8.0   Glucose, UA >=500 (A) NEGATIVE mg/dL   Hgb urine dipstick MODERATE (A) NEGATIVE   Bilirubin Urine NEGATIVE NEGATIVE   Ketones, ur 80 (A) NEGATIVE mg/dL   Protein, ur 100 (A) NEGATIVE mg/dL   Nitrite NEGATIVE NEGATIVE   Leukocytes, UA NEGATIVE NEGATIVE   RBC / HPF 0-5 0 - 5 RBC/hpf   WBC, UA 0-5 0 - 5 WBC/hpf   Bacteria, UA NONE SEEN NONE SEEN   Squamous Epithelial / LPF 0-5 0 - 5   Hyaline Casts, UA PRESENT     Comment: Performed at [facility], [place] [address] [place], [place] [address]  CBG monitoring, ED     Status: Abnormal   Collection Time: 12/05/17  5:33 AM  Result Value Ref Range   Glucose-Capillary 381 (H) 65 - 99 mg/dL  Blood gas, arterial     Status: Abnormal (Preliminary result)   Collection Time: 12/05/17  5:45 AM  Result Value Ref Range   FIO2 PENDING    O2 Content PENDING L/min   pH, Arterial 7.085 (LL)  7.350 - 7.450    Comment: CRITICAL RESULT CALLED TO, READ BACK BY AND VERIFIED WITH: Tymarion Everard RN BY KELLY JARRELL AT [phone] ON 12/05/2017    pCO2 arterial BELOW REPORTABLE RANGE 32.0 - 48.0 mmHg    Comment: CRITICAL RESULT CALLED TO, READ BACK BY AND VERIFIED WITH: RBV Jersie Beel RN BY KELLY JARRELL, RRT AT 0555 ON 12/05/2017    pO2, Arterial 140 (H) 83.0 - 108.0 mmHg   Bicarbonate PENDING 20.0 - 28.0 mmol/L   Acid-Base Excess PENDING 0.0 - 2.0 mmol/L   Acid-base deficit PENDING 0.0 - 2.0 mmol/L   O2 Saturation 97.0 %   Patient temperature 98.6    Collection site RIGHT RADIAL    Drawn by [id number]    Sample type ARTERIAL DRAW    Allens test (pass/fail) PASS PASS    Comment: Performed at [facility], [place] [address], [place], [place] [address]  CBG monitoring, ED     Status: Abnormal   Collection Time: 12/05/17  6:31 AM  Result Value Ref Range   Glucose-Capillary 334 (H) 65 - 99 mg/dL  Basic metabolic panel     Status: Abnormal   Collection Time: 12/05/17  6:32 AM  Result Value Ref Range   Sodium 142 135 - 145 mmol/L   Potassium 4.3 3.5 - 5.1 mmol/L   Chloride 110 101 - 111 mmol/L   CO2 7 (L) 22 - 32 mmol/L   Glucose, Bld 358 (H) 65 - 99 mg/dL   BUN 14 6 - 20 mg/dL   Creatinine, Ser 1.56 (H) 0.61 - 1.24 mg/dL   Calcium 8.4 (L) 8.9 - 10.3 mg/dL   GFR calc non Af Amer 54 (L) >60 mL/min   GFR calc Af Amer >60 >60 mL/min    Comment: (NOTE) The eGFR has been calculated using the CKD EPI equation. This calculation has not been validated in all clinical situations. eGFR's persistently <60 mL/min signify possible Chronic Kidney Disease.    Anion gap 25 (H) 5 - 15    Comment: Performed at [facility], [place] [address], [place], [place] [address]  Lactic acid, plasma     Status: Abnormal   Collection Time: 12/05/17  6:32 AM  Result Value Ref Range   Lactic Acid, Venous 3.9 (HH) 0.5 - 1.9 mmol/L    Comment: CRITICAL RESULT CALLED TO, READ BACK BY AND VERIFIED  WITH: S.BINGHUM,RN [id number] '@0730'$  BY V.WILKINS Performed at [facility], [place] [address], [place], [place] [address]   Magnesium     Status: None   Collection Time: 12/05/17  6:32 AM  Result Value Ref Range   Magnesium 2.3 1.7 - 2.4 mg/dL    Comment: Performed at [facility], [place] [address], [place], [place] [address]  Phosphorus     Status: Abnormal   Collection Time: 12/05/17  6:32 AM  Result Value Ref Range   Phosphorus 5.2 (H) 2.5 - 4.6 mg/dL    Comment: Performed at [facility], [place] [address] [place], [place] [address]   Dg Chest Port 1 View  Result Date: 12/05/2017 CLINICAL DATA:  Initial evaluation for acute shortness of breath. EXAM: PORTABLE CHEST 1 VIEW COMPARISON:  None. FINDINGS: The cardiac and mediastinal silhouettes are within normal limits. The lungs are hypoinflated. No airspace consolidation, pleural effusion, or pulmonary edema is identified. There is no pneumothorax. No acute osseous abnormality identified. IMPRESSION: No active disease. Electronically Signed   By: Jeannine Boga M.D.   On: 12/05/2017 02:55    Pending Labs Unresulted Labs (From admission, onward)   Start     Ordered   12/06/17 0500  Procalcitonin  Daily,   R     12/05/17 0536   12/05/17 0538  Culture, blood (routine x 2)  BLOOD CULTURE X 2,   R     12/05/17 0537   12/05/17 0537  Procalcitonin - Baseline  STAT,   STAT     12/05/17 0536   12/05/17 0536  Lactic acid, plasma  STAT Now then every 3 hours,   R     12/05/17 0535   12/05/17 8675  Basic metabolic panel  Now then every 4 hours,   R     12/05/17 0534   12/05/17 0533  Beta-hydroxybutyric acid  Once,   R     12/05/17 0532  12/05/17 0512  Urine culture  STAT,   STAT     12/05/17 0511      Vitals/Pain Today's Vitals   12/05/17 0515 12/05/17 0530 12/05/17 0651 12/05/17 0715  BP: (!) 183/109 (!) 152/82 (!) 145/86 133/85  Pulse: (!) 142 (!) 117 (!) 129 (!) 120  Resp: (!)  40 (!) 43 (!) 28 (!) 33  Temp:      TempSrc:      SpO2: 100% 99% 99% 99%  Weight:      Height:      PainSc:    0-No pain    Isolation Precautions No active isolations  Medications Medications  insulin regular (NOVOLIN R,HUMULIN R) 100 Units in sodium chloride 0.9 % 100 mL (1 Units/mL) infusion (11.6 Units/hr Intravenous Rate/Dose Change 12/05/17 0732)  sodium bicarbonate 150 mEq in sterile water 1,000 mL infusion ( Intravenous Transfusing/Transfer 12/05/17 0724)  0.9 %  sodium chloride infusion (has no administration in time range)  dextrose 5 %-0.45 % sodium chloride infusion (has no administration in time range)  heparin injection 5,000 Units (has no administration in time range)  sodium chloride 0.9 % bolus 1,000 mL (0 mLs Intravenous Stopped 12/05/17 0346)  sodium bicarbonate injection 50 mEq (50 mEq Intravenous Given 12/05/17 0255)  sodium bicarbonate injection 50 mEq (50 mEq Intravenous Given 12/05/17 0255)  sodium chloride 0.9 % bolus 1,000 mL (0 mLs Intravenous Stopped 12/05/17 0348)  Racepinephrine HCl 2.25 % nebulizer solution 0.5 mL (0.5 mLs Nebulization Given by Other 12/05/17 0316)  sodium bicarbonate injection 50 mEq (50 mEq Intravenous Given 12/05/17 3729)    Mobility non-ambulatory at this time, walks at baseline

## 2017-12-05 NOTE — Progress Notes (Signed)
Notified by bedside RN, Judson Roch, that patient has  suicidal ideations. Patient was admitted with DKA and mentation has been altered. Patient just expressed these concerns, since mentation has been more clear now. Asked patient if patient had tried not taking insulin to cause himself to go into DKA crisis and ultimately die. Patient said "yes". When asked the question why, the patient said, " I am just tired of it all." Then asked patient if patient had been compliant with taking his lithium and other medications at home. Patient said no. Patient said "at this time he has no desire to hurt himself." Based on all this information, suicide precautions were initiated. MD Byrum paged. Sitter at bedside now. Will continue to monitor and assess.

## 2017-12-05 NOTE — ED Provider Notes (Signed)
[facility] DEPT Provider Note: [name], MD, FACEP  CSN: [id number] MRN: [id number] ARRIVAL: 12/05/17 at 0218 ROOM: RESA/RESA   CHIEF COMPLAINT  Shortness of Breath  Level 5 caveat: Altered mental status HISTORY OF PRESENT ILLNESS  12/05/17 2:42 AM Marc Long is a 42 y.o. male with history of diabetes.  About 2 weeks ago he developed a gastrointestinal illness with nausea and vomiting.  He had not fully recovered and has not had much of an appetite for the past several days.  He has been eating little.  It is unclear if he has been taking his insulin.  Over the past 24 hours he has developed shortness of breath which has become severe this morning.  Associated with this is confusion and his roommate has provided most of the history.  On arrival he is profoundly tachypneic with stridor noted.  His glucose on arrival is 483.   Past Medical History:  Diagnosis Date  . Depression   . Diabetes mellitus without complication ([place])   . Gallstones   . Obesity, Class III, BMI 40-49.9 (morbid obesity) ([place])     No past surgical history on file.  Family History  Problem Relation Age of Onset  . Hypertension Mother   . Depression Mother   . Heart attack Maternal Grandfather   . Lymphoma Maternal Grandmother   . Diabetes Maternal Aunt   . Cancer Maternal Aunt     Social History   Tobacco Use  . Smoking status: Never Smoker  . Smokeless tobacco: Never Used  Substance Use Topics  . Alcohol use: Yes    Alcohol/week: 0.0 oz    Comment: once every 2 weeks. Wine coolers  . Drug use: No    Prior to Admission medications   Medication Sig Start Date End Date Taking? Authorizing Provider  ACCU-CHEK GUIDE test strip USE UP TO 4 TIMES EVERY DAY AS DIRECTED 02/24/17   [name], MD  blood glucose meter kit and supplies Dispense based on patient and insurance preference. Use up to four times daily as directed. (FOR ICD-9 250.00, 250.01). 12/15/16   Hongalgi, Lenis Dickinson, MD    Cariprazine HCl (VRAYLAR PO) Take 3 mg by mouth.     [provider]  dapagliflozin propanediol (FARXIGA) 10 MG TABS tablet Take 10 mg by mouth daily. Patient not taking: Reported on 03/31/2017 03/05/17   [name], MD  Exenatide ER (BYDUREON BCISE) 2 MG/0.85ML AUIJ Inject 2 mg into the skin once a week. 02/05/17   [name], MD  Insulin Glargine (LANTUS SOLOSTAR) 100 UNIT/ML Solostar Pen Inject 20 Units into the skin daily. 02/05/17   [name], MD  Insulin Glargine (LANTUS SOLOSTAR) 100 UNIT/ML Solostar Pen Inject 15 Units into the skin daily at 10 pm. **NEEDS OFFICE VISIT FOR ANY ADDITIONAL REFILLS** 09/06/17   [name], MD  insulin lispro (HUMALOG KWIKPEN) 100 UNIT/ML KiwkPen Inject 0-0.15 mLs (0-15 Units total) into the skin 3 (three) times daily with meals. CBG < 70: eat or drink something sweet and recheck. Patient taking differently: Inject 0-15 Units into the skin 3 (three) times daily with meals. ONLY USES IF BS ARE OVER 120  CBG < 70: eat or drink something sweet and recheck. 12/18/16   [name], MD  Insulin Pen Needle (PEN NEEDLES 3/16") 31G X 5 MM MISC Use as per instructions 3 times a day. 12/18/16   [name], MD  lamoTRIgine (LAMICTAL) 200 MG tablet TAKE 200  MG BY MOUTH ONCE DAILY 11/25/16   [provider]  lithium carbonate (LITHOBID) 300 MG CR tablet Take 3 (900 MG) tablets by mouth daily 11/25/16   [provider]  LORazepam (ATIVAN) 0.5 MG tablet Take 0.5 mg by mouth every 8 (eight) hours as needed for anxiety.     [provider]  metFORMIN (GLUCOPHAGE) 1000 MG tablet Take 1 tablet (1,000 mg total) by mouth 2 (two) times daily with a meal. 12/18/16   [name], MD  methocarbamol (ROBAXIN) 500 MG tablet Take 1 tablet (500 mg total) by mouth 4 (four) times daily. 03/31/17   McVey, Gelene Mink, PA-C  Multiple Vitamins-Minerals (MULTIVITAMIN WITH MINERALS) tablet Take 1 tablet by mouth daily.    [provider]  polyethylene  glycol (MIRALAX / GLYCOLAX) packet Take 17 g by mouth daily. 12/16/16   Hongalgi, Lenis Dickinson, MD    Allergies Bee venom; Keflex [cephalexin]; Relafen [nabumetone]; and Shellfish allergy   REVIEW OF SYSTEMS     PHYSICAL EXAMINATION  Initial Vital Signs Blood pressure (!) 163/108, pulse (!) 138, temperature (!) 97.3 F (36.3 C), temperature source Oral, height _0  (1.854 m), weight (!) 146.5 kg (323 lb), SpO2 99 %.  Examination General: Well-developed, well-nourished male in no acute distress; appearance consistent with age of record HENT: normocephalic; atraumatic; ketotic breath Eyes: pupils equal, round and reactive to light; extraocular muscles intact Neck: supple Heart: regular rate and rhythm; tachycardia Lungs: Tachypnea; stridor Abdomen: soft; nondistended; nontender; bowel sounds present Extremities: No deformity; full range of motion; pulses normal Neurologic: Awake, alert, minimally able to converse; noted to move all extremities Skin: Warm and dry Psychiatric: Flat affect   RESULTS  Summary of this visit's results, reviewed by myself:   EKG Interpretation  Date/Time:  Saturday December 05 2017 02:36:31 EDT Ventricular Rate:  130 PR Interval:    QRS Duration: 84 QT Interval:  320 QTC Calculation: 471 R Axis:   53 Text Interpretation:  Sinus tachycardia Borderline repolarization abnormality Baseline wander in multiple leads Rate is faster Confirmed by Shanon Rosser [phone]) on 12/05/2017 3:00:39 AM      Laboratory Studies: Results for orders placed or performed during the hospital encounter of 12/05/17 (from the past 24 hour(s))  POC CBG, ED     Status: Abnormal   Collection Time: 12/05/17  2:29 AM  Result Value Ref Range   Glucose-Capillary 483 (H) 65 - 99 mg/dL  Blood gas, venous     Status: Abnormal (Preliminary result)   Collection Time: 12/05/17  2:35 AM  Result Value Ref Range   FIO2 21.00    O2 Content PENDING L/min   pH, Ven 6.949 (LL) 7.250 - 7.430    pCO2, Ven 26.8 (L) 44.0 - 60.0 mmHg   pO2, Ven 48.9 (H) 32.0 - 45.0 mmHg   Bicarbonate 5.6 (L) 20.0 - 28.0 mmol/L   Acid-base deficit 27.5 (H) 0.0 - 2.0 mmol/L   O2 Saturation 66.7 %   Patient temperature 98.6    Collection site VEIN    Drawn by [id number]    Sample type VEIN   CBC with Differential     Status: Abnormal   Collection Time: 12/05/17  2:43 AM  Result Value Ref Range   WBC 15.6 (H) 4.0 - 10.5 K/uL   RBC 5.38 4.22 - 5.81 MIL/uL   Hemoglobin 16.1 13.0 - 17.0 g/dL   HCT 48.1 39.0 - 52.0 %   MCV 89.4 78.0 - 100.0 fL   MCH  29.9 26.0 - 34.0 pg   MCHC 33.5 30.0 - 36.0 g/dL   RDW 15.4 11.5 - 15.5 %   Platelets 342 150 - 400 K/uL   Neutrophils Relative % 78 %   Lymphocytes Relative 15 %   Monocytes Relative 0 %   Eosinophils Relative 0 %   Basophils Relative 2 %   Band Neutrophils 5 %   Neutro Abs 13.0 (H) 1.7 - 7.7 K/uL   Lymphs Abs 2.3 0.7 - 4.0 K/uL   Monocytes Absolute 0.0 (L) 0.1 - 1.0 K/uL   Eosinophils Absolute 0.0 0.0 - 0.7 K/uL   Basophils Absolute 0.3 (H) 0.0 - 0.1 K/uL   WBC Morphology TOXIC GRANULATION   Comprehensive metabolic panel     Status: Abnormal   Collection Time: 12/05/17  2:43 AM  Result Value Ref Range   Sodium 137 135 - 145 mmol/L   Potassium 3.9 3.5 - 5.1 mmol/L   Chloride 102 101 - 111 mmol/L   CO2 11 (L) 22 - 32 mmol/L   Glucose, Bld 481 (H) 65 - 99 mg/dL   BUN 13 6 - 20 mg/dL   Creatinine, Ser 1.78 (H) 0.61 - 1.24 mg/dL   Calcium 9.1 8.9 - 10.3 mg/dL   Total Protein 8.1 6.5 - 8.1 g/dL   Albumin 4.2 3.5 - 5.0 g/dL   AST 24 15 - 41 U/L   ALT 27 17 - 63 U/L   Alkaline Phosphatase 101 38 - 126 U/L   Total Bilirubin 1.1 0.3 - 1.2 mg/dL   GFR calc non Af Amer 46 (L) >60 mL/min   GFR calc Af Amer 53 (L) >60 mL/min   Anion gap 24 (H) 5 - 15  Brain natriuretic peptide     Status: None   Collection Time: 12/05/17  2:44 AM  Result Value Ref Range   B Natriuretic Peptide 60.5 0.0 - 100.0 pg/mL  Lithium level     Status: Abnormal   Collection  Time: 12/05/17  2:44 AM  Result Value Ref Range   Lithium Lvl <0.06 (L) 0.60 - 1.20 mmol/L  I-Stat Troponin, ED (not at [facility])     Status: None   Collection Time: 12/05/17  2:53 AM  Result Value Ref Range   Troponin i, poc 0.00 0.00 - 0.08 ng/mL   Comment 3          I-Stat CG4 Lactic Acid, ED     Status: Abnormal   Collection Time: 12/05/17  2:53 AM  Result Value Ref Range   Lactic Acid, Venous 4.69 (HH) 0.5 - 1.9 mmol/L   Comment NOTIFIED PHYSICIAN   CBG monitoring, ED     Status: Abnormal   Collection Time: 12/05/17  4:20 AM  Result Value Ref Range   Glucose-Capillary 419 (H) 65 - 99 mg/dL  Blood gas, venous     Status: Abnormal   Collection Time: 12/05/17  4:46 AM  Result Value Ref Range   FIO2 21.00    pH, Ven 6.989 (LL) 7.250 - 7.430   pCO2, Ven 22.8 (L) 44.0 - 60.0 mmHg   pO2, Ven 44.3 32.0 - 45.0 mmHg   Bicarbonate 5.3 (L) 20.0 - 28.0 mmol/L   Acid-base deficit 26.9 (H) 0.0 - 2.0 mmol/L   O2 Saturation 66.3 %   Patient temperature 97.3    Collection site VEIN    Drawn by DRAWN BY RN    Sample type VENOUS    Imaging Studies: Dg Chest [facility] 1 View  Result  Date: 12/05/2017 CLINICAL DATA:  Initial evaluation for acute shortness of breath. EXAM: PORTABLE CHEST 1 VIEW COMPARISON:  None. FINDINGS: The cardiac and mediastinal silhouettes are within normal limits. The lungs are hypoinflated. No airspace consolidation, pleural effusion, or pulmonary edema is identified. There is no pneumothorax. No acute osseous abnormality identified. IMPRESSION: No active disease. Electronically Signed   By: Jeannine Boga M.D.   On: 12/05/2017 02:55    ED COURSE and MDM  Nursing notes and initial vitals signs, including pulse oximetry, reviewed.  Vitals:   12/05/17 0345 12/05/17 0400 12/05/17 0415 12/05/17 0506  BP: (!) 135/94 (!) 156/136 (!) 143/74   Pulse: (!) 117 (!) 121 (!) 117   Resp: (!) 42 (!) 32 (!) 48   Temp:    98.8 F (37.1 C)  TempSrc:    Rectal  SpO2: 99% 100% 100%     Weight:      Height:         2:54 AM Blood gas shows pH of 6.949.  This with patient's known diabetes and ketotic breath is consistent with diabetic ketoacidosis.  Two amps of sodium bicarb ordered.  Insulin drip per glucose stabilizer ordered.  IV fluid bolus initiated.  3:20 AM Tachypnea improved after bicarb.  Patient on insulin drip.  Second liter of normal saline being hung.  4:46 AM Patient still tachypneic.  Will repeat blood gas.  5:19 AM Repeat blood gas shows continued metabolic acidosis.  Discussed with Dr. Tamala Julian of pulmonary critical care.  He will have his team admit the patient to the ICU.  PROCEDURES   CRITICAL CARE Performed by: Shanon Rosser L Total critical care time: 45 minutes Critical care time was exclusive of separately billable procedures and treating other patients. Critical care was necessary to treat or prevent imminent or life-threatening deterioration. Critical care was time spent personally by me on the following activities: development of treatment plan with patient and/or surrogate as well as nursing, discussions with consultants, evaluation of patient's response to treatment, examination of patient, obtaining history from patient or surrogate, ordering and performing treatments and interventions, ordering and review of laboratory studies, ordering and review of radiographic studies, pulse oximetry and re-evaluation of patient's condition.   ED DIAGNOSES     ICD-10-CM   1. Diabetic ketoacidosis without coma associated with type 1 diabetes mellitus ([place]) E10.10   2. SOB (shortness of breath) R06.02 DG Chest [facility] Chest [place] [address]       [place], [place], [place] 12/05/17 [phone]

## 2017-12-05 NOTE — ED Triage Notes (Signed)
Pt presents from home with roommate for SOB. Pt's roommate reports that the pt was sick with N/V about 2 weeks ago, and hasn't really been well since. Pt able to say that he became SOB tonight.

## 2017-12-05 NOTE — ED Notes (Signed)
ED TO INPATIENT HANDOFF REPORT  Name/Age/Gender Marc Long 42 y.o. male  Code Status    Code Status Orders  (From admission, onward)        Start     Ordered   12/05/17 0603  Full code  Continuous     12/05/17 0605    Code Status History    Date Active Date Inactive Code Status Order ID Comments User Context   04/13/2017 1422 04/13/2017 2118 Full Code [id number]  Lorelle Gibbs ED   12/12/2016 1523 12/15/2016 2020 Full Code [id number]  Modena Jansky, MD Inpatient   12/12/2016 1452 12/12/2016 1523 Full Code [id number]  Modena Jansky, MD Inpatient      Home/SNF/Other Home  Chief Complaint Shortness of Breath  Level of Care/Admitting Diagnosis ED Disposition    ED Disposition Condition [place]: [facility] [[id number]]  Level of Care: ICU [6]  Diagnosis: DKA (diabetic ketoacidoses) ([place]) [[id number]]  Admitting Physician: Collene Gobble [[id number]]  Attending Physician: Collene Gobble [[id number]]  Estimated length of stay: 3 - 4 days  Certification:: I certify this patient will need inpatient services for at least 2 midnights  PT Class (Do Not Modify): Inpatient [101]  PT Acc Code (Do Not Modify): Private [1]       Medical History Past Medical History:  Diagnosis Date  . Depression   . Diabetes mellitus without complication ([place])   . Gallstones   . Obesity, Class III, BMI 40-49.9 (morbid obesity) ([facility])     Allergies Allergies  Allergen Reactions  . Bee Venom Swelling  . Keflex [Cephalexin]     UPSET STOMACH  . Relafen [Nabumetone]     Blood in stool  . Shellfish Allergy Nausea Only    IV Location/Drains/Wounds Patient Lines/Drains/Airways Status   Active Line/Drains/Airways    Name:   Placement date:   Placement time:   Site:   Days:   Peripheral IV 12/05/17 Right Antecubital   12/05/17    0255    Antecubital   less than 1   Peripheral IV 12/05/17 Left Antecubital   12/05/17    0320    Antecubital   less than 1           Labs/Imaging Results for orders placed or performed during the hospital encounter of 12/05/17 (from the past 48 hour(s))  POC CBG, ED     Status: Abnormal   Collection Time: 12/05/17  2:29 AM  Result Value Ref Range   Glucose-Capillary 483 (H) 65 - 99 mg/dL  Blood gas, venous     Status: Abnormal (Preliminary result)   Collection Time: 12/05/17  2:35 AM  Result Value Ref Range   FIO2 21.00    O2 Content PENDING L/min   pH, Ven 6.949 (LL) 7.250 - 7.430    Comment: RBV 21    pCO2, Ven 26.8 (L) 44.0 - 60.0 mmHg   pO2, Ven 48.9 (H) 32.0 - 45.0 mmHg   Bicarbonate 5.6 (L) 20.0 - 28.0 mmol/L   Acid-base deficit 27.5 (H) 0.0 - 2.0 mmol/L   O2 Saturation 66.7 %   Patient temperature 98.6    Collection site VEIN    Drawn by [phone]    Sample type VEIN     Comment: Performed at [facility], [place] [address], [place], [place] [address]  CBC with Differential     Status: Abnormal   Collection Time: 12/05/17  2:43 AM  Result Value  Ref Range   WBC 15.6 (H) 4.0 - 10.5 K/uL   RBC 5.38 4.22 - 5.81 MIL/uL   Hemoglobin 16.1 13.0 - 17.0 g/dL   HCT 48.1 39.0 - 52.0 %   MCV 89.4 78.0 - 100.0 fL   MCH 29.9 26.0 - 34.0 pg   MCHC 33.5 30.0 - 36.0 g/dL   RDW 15.4 11.5 - 15.5 %   Platelets 342 150 - 400 K/uL   Neutrophils Relative % 78 %   Lymphocytes Relative 15 %   Monocytes Relative 0 %   Eosinophils Relative 0 %   Basophils Relative 2 %   Band Neutrophils 5 %   Neutro Abs 13.0 (H) 1.7 - 7.7 K/uL   Lymphs Abs 2.3 0.7 - 4.0 K/uL   Monocytes Absolute 0.0 (L) 0.1 - 1.0 K/uL   Eosinophils Absolute 0.0 0.0 - 0.7 K/uL   Basophils Absolute 0.3 (H) 0.0 - 0.1 K/uL   WBC Morphology TOXIC GRANULATION     Comment: Performed at [facility], [place] [address] [place], [place] [address]  Comprehensive metabolic panel     Status: Abnormal   Collection Time: 12/05/17  2:43 AM  Result Value Ref Range   Sodium 137 135 - 145 mmol/L   Potassium 3.9 3.5 - 5.1  mmol/L   Chloride 102 101 - 111 mmol/L   CO2 11 (L) 22 - 32 mmol/L    Comment: REPEATED TO VERIFY   Glucose, Bld 481 (H) 65 - 99 mg/dL   BUN 13 6 - 20 mg/dL   Creatinine, Ser 1.78 (H) 0.61 - 1.24 mg/dL   Calcium 9.1 8.9 - 10.3 mg/dL   Total Protein 8.1 6.5 - 8.1 g/dL   Albumin 4.2 3.5 - 5.0 g/dL   AST 24 15 - 41 U/L   ALT 27 17 - 63 U/L   Alkaline Phosphatase 101 38 - 126 U/L   Total Bilirubin 1.1 0.3 - 1.2 mg/dL   GFR calc non Af Amer 46 (L) >60 mL/min   GFR calc Af Amer 53 (L) >60 mL/min    Comment: (NOTE) The eGFR has been calculated using the CKD EPI equation. This calculation has not been validated in all clinical situations. eGFR's persistently <60 mL/min signify possible Chronic Kidney Disease.    Anion gap 24 (H) 5 - 15    Comment: REPEATED TO VERIFY Performed at [facility], [place] [address], [place], [place] [address]   Brain natriuretic peptide     Status: None   Collection Time: 12/05/17  2:44 AM  Result Value Ref Range   B Natriuretic Peptide 60.5 0.0 - 100.0 pg/mL    Comment: Performed at [facility], [place] [address] [place], [place] [address]  Lithium level     Status: Abnormal   Collection Time: 12/05/17  2:44 AM  Result Value Ref Range   Lithium Lvl <0.06 (L) 0.60 - 1.20 mmol/L    Comment: Performed at [facility], [place] [address], [place], [place] [address]  I-Stat Troponin, ED (not at [facility])     Status: None   Collection Time: 12/05/17  2:53 AM  Result Value Ref Range   Troponin i, poc 0.00 0.00 - 0.08 ng/mL   Comment 3            Comment: Due to the release kinetics of cTnI, a negative result within the first hours of the onset of symptoms does not rule out myocardial infarction with certainty. If  myocardial infarction is still suspected, repeat the test at appropriate intervals.   I-Stat CG4 Lactic Acid, ED     Status: Abnormal   Collection Time: 12/05/17  2:53 AM  Result Value Ref Range    Lactic Acid, Venous 4.69 (HH) 0.5 - 1.9 mmol/L   Comment NOTIFIED PHYSICIAN   CBG monitoring, ED     Status: Abnormal   Collection Time: 12/05/17  4:20 AM  Result Value Ref Range   Glucose-Capillary 419 (H) 65 - 99 mg/dL  Blood gas, venous     Status: Abnormal   Collection Time: 12/05/17  4:46 AM  Result Value Ref Range   FIO2 21.00    pH, Ven 6.989 (LL) 7.250 - 7.430    Comment: CRITICAL RESULT CALLED TO, READ BACK BY AND VERIFIED WITH: Shanon Rosser, MD AT [phone] ON 12/05/2017 BY Danella Sensing, RRT, RCP    pCO2, Ven 22.8 (L) 44.0 - 60.0 mmHg   pO2, Ven 44.3 32.0 - 45.0 mmHg   Bicarbonate 5.3 (L) 20.0 - 28.0 mmol/L   Acid-base deficit 26.9 (H) 0.0 - 2.0 mmol/L   O2 Saturation 66.3 %   Patient temperature 97.3    Collection site VEIN    Drawn by DRAWN BY RN    Sample type VENOUS     Comment: Performed at [facility], [place] [address], [place], [place] [address]  Urinalysis, Routine w reflex microscopic     Status: Abnormal   Collection Time: 12/05/17  5:12 AM  Result Value Ref Range   Color, Urine YELLOW YELLOW   APPearance CLEAR CLEAR   Specific Gravity, Urine 1.018 1.005 - 1.030   pH 5.0 5.0 - 8.0   Glucose, UA >=500 (A) NEGATIVE mg/dL   Hgb urine dipstick MODERATE (A) NEGATIVE   Bilirubin Urine NEGATIVE NEGATIVE   Ketones, ur 80 (A) NEGATIVE mg/dL   Protein, ur 100 (A) NEGATIVE mg/dL   Nitrite NEGATIVE NEGATIVE   Leukocytes, UA NEGATIVE NEGATIVE   RBC / HPF 0-5 0 - 5 RBC/hpf   WBC, UA 0-5 0 - 5 WBC/hpf   Bacteria, UA NONE SEEN NONE SEEN   Squamous Epithelial / LPF 0-5 0 - 5   Hyaline Casts, UA PRESENT     Comment: Performed at [facility], [place] [address] [place], [place] [address]  CBG monitoring, ED     Status: Abnormal   Collection Time: 12/05/17  5:33 AM  Result Value Ref Range   Glucose-Capillary 381 (H) 65 - 99 mg/dL  Blood gas, arterial     Status: Abnormal (Preliminary result)   Collection Time: 12/05/17  5:45 AM  Result  Value Ref Range   FIO2 PENDING    O2 Content PENDING L/min   pH, Arterial 7.085 (LL) 7.350 - 7.450    Comment: CRITICAL RESULT CALLED TO, READ BACK BY AND VERIFIED WITH: EMILY RN BY KELLY JARRELL AT [phone] ON 12/05/2017    pCO2 arterial BELOW REPORTABLE RANGE 32.0 - 48.0 mmHg    Comment: CRITICAL RESULT CALLED TO, READ BACK BY AND VERIFIED WITH: RBV EMILY RN BY KELLY JARRELL, RRT AT 0555 ON 12/05/2017    pO2, Arterial 140 (H) 83.0 - 108.0 mmHg   Bicarbonate PENDING 20.0 - 28.0 mmol/L   Acid-Base Excess PENDING 0.0 - 2.0 mmol/L   Acid-base deficit PENDING 0.0 - 2.0 mmol/L   O2 Saturation 97.0 %   Patient temperature 98.6    Collection site RIGHT RADIAL    Drawn by [id number]  Sample type ARTERIAL DRAW    Allens test (pass/fail) PASS PASS    Comment: Performed at [facility], [place] [address] [place], [place] [address]  CBG monitoring, ED     Status: Abnormal   Collection Time: 12/05/17  6:31 AM  Result Value Ref Range   Glucose-Capillary 334 (H) 65 - 99 mg/dL   Dg Chest Port 1 View  Result Date: 12/05/2017 CLINICAL DATA:  Initial evaluation for acute shortness of breath. EXAM: PORTABLE CHEST 1 VIEW COMPARISON:  None. FINDINGS: The cardiac and mediastinal silhouettes are within normal limits. The lungs are hypoinflated. No airspace consolidation, pleural effusion, or pulmonary edema is identified. There is no pneumothorax. No acute osseous abnormality identified. IMPRESSION: No active disease. Electronically Signed   By: Jeannine Boga M.D.   On: 12/05/2017 02:55    Pending Labs Unresulted Labs (From admission, onward)   Start     Ordered   12/06/17 0500  Procalcitonin  Daily,   R     12/05/17 0536   12/05/17 0538  Culture, blood (routine x 2)  BLOOD CULTURE X 2,   R     12/05/17 0537   12/05/17 0538  Magnesium  Once,   R     12/05/17 0537   12/05/17 0538  Phosphorus  Once,   R     12/05/17 0537   12/05/17 0537  Procalcitonin - Baseline  STAT,   STAT      12/05/17 0536   12/05/17 0536  Lactic acid, plasma  STAT Now then every 3 hours,   R     12/05/17 0535   12/05/17 3976  Basic metabolic panel  Now then every 4 hours,   R     12/05/17 0534   12/05/17 0533  Beta-hydroxybutyric acid  Once,   R     12/05/17 0532   12/05/17 0512  Urine culture  STAT,   STAT     12/05/17 0511      Vitals/Pain Today's Vitals   12/05/17 0515 12/05/17 0530 12/05/17 0651 12/05/17 0715  BP: (!) 183/109 (!) 152/82 (!) 145/86 133/85  Pulse: (!) 142 (!) 117 (!) 129 (!) 120  Resp: (!) 40 (!) 43 (!) 28 (!) 33  Temp:      TempSrc:      SpO2: 100% 99% 99% 99%  Weight:      Height:      PainSc:    0-No pain    Isolation Precautions No active isolations  Medications Medications  insulin regular (NOVOLIN R,HUMULIN R) 100 Units in sodium chloride 0.9 % 100 mL (1 Units/mL) infusion (11 Units/hr Intravenous New Bag/Given 12/05/17 0640)  sodium bicarbonate 150 mEq in sterile water 1,000 mL infusion ( Intravenous Transfusing/Transfer 12/05/17 0724)  0.9 %  sodium chloride infusion (has no administration in time range)  dextrose 5 %-0.45 % sodium chloride infusion (has no administration in time range)  heparin injection 5,000 Units (has no administration in time range)  sodium chloride 0.9 % bolus 1,000 mL (0 mLs Intravenous Stopped 12/05/17 0346)  sodium bicarbonate injection 50 mEq (50 mEq Intravenous Given 12/05/17 0255)  sodium bicarbonate injection 50 mEq (50 mEq Intravenous Given 12/05/17 0255)  sodium chloride 0.9 % bolus 1,000 mL (0 mLs Intravenous Stopped 12/05/17 0348)  Racepinephrine HCl 2.25 % nebulizer solution 0.5 mL (0.5 mLs Nebulization Given by Other 12/05/17 0316)  sodium bicarbonate injection 50 mEq (50 mEq Intravenous Given 12/05/17 7341)    Mobility

## 2017-12-05 NOTE — ED Notes (Addendum)
Patty RN applied heat pack to infiltrated IV site.

## 2017-12-05 NOTE — Significant Event (Signed)
FULL CONSULT TO FOLLOW .Marland Kitchen  Name: Marc Long MRN: [id number] DOB: November 27, 1975    ADMISSION DATE:  12/05/2017 CONSULTATION DATE:  12/05/17  REFERRING MD :  Florina Ou MD  CHIEF COMPLAINT:  SOB  BRIEF PATIENT DESCRIPTION: 42 yr old morbidly obese male with PMHx DM, Depression and Gallstones presents to [facility] with a 2 week history of GI illness, decreased PO intake and questionable compliance to his insulin.   PCCM consulted for admission for mgmt of DKA   PAST MEDICAL HISTORY :   has a past medical history of Depression, Diabetes mellitus without complication ([place]), Gallstones, and Obesity, Class III, BMI 40-49.9 (morbid obesity) ([place]).  has no past surgical history on file. Prior to Admission medications   Medication Sig Start Date End Date Taking? Authorizing Provider  busPIRone (BUSPAR) 10 MG tablet Take 10 mg by mouth 3 (three) times daily. 10/06/17  Yes [provider]  Exenatide ER (BYDUREON BCISE) 2 MG/0.85ML AUIJ Inject 2 mg into the skin once a week. 02/05/17  Yes [name], MD  FLUoxetine (PROZAC) 20 MG capsule Take 20 mg by mouth daily. 11/03/17  Yes [provider]  Insulin Glargine (LANTUS SOLOSTAR) 100 UNIT/ML Solostar Pen Inject 15 Units into the skin daily at 10 pm. **NEEDS OFFICE VISIT FOR ANY ADDITIONAL REFILLS** 09/06/17  Yes [name], MD  insulin lispro (HUMALOG KWIKPEN) 100 UNIT/ML KiwkPen Inject 0-0.15 mLs (0-15 Units total) into the skin 3 (three) times daily with meals. CBG < 70: eat or drink something sweet and recheck. Patient taking differently: Inject 0-15 Units into the skin 3 (three) times daily with meals. ONLY USES IF BS ARE OVER 120  CBG < 70: eat or drink something sweet and recheck. 12/18/16  Yes [name], MD  lamoTRIgine (LAMICTAL) 200 MG tablet TAKE 200 MG BY MOUTH ONCE DAILY 11/25/16  Yes [provider]  lithium carbonate (LITHOBID) 300 MG CR tablet Take 3 (900 MG) tablets by mouth daily 11/25/16  Yes [provider]    LORazepam (ATIVAN) 0.5 MG tablet Take 0.5 mg by mouth every 8 (eight) hours as needed for anxiety.    Yes [provider]  pramipexole (MIRAPEX) 0.25 MG tablet Take 0.75 mg by mouth at bedtime.   Yes [provider]  ACCU-CHEK GUIDE test strip USE UP TO 4 TIMES EVERY DAY AS DIRECTED 02/24/17   [name], MD  blood glucose meter kit and supplies Dispense based on patient and insurance preference. Use up to four times daily as directed. (FOR ICD-9 250.00, 250.01). 12/15/16   Hongalgi, Lenis Dickinson, MD  dapagliflozin propanediol (FARXIGA) 10 MG TABS tablet Take 10 mg by mouth daily. Patient not taking: Reported on 03/31/2017 03/05/17   [name], MD  Insulin Glargine (LANTUS SOLOSTAR) 100 UNIT/ML Solostar Pen Inject 20 Units into the skin daily. Patient not taking: Reported on 12/05/2017 02/05/17   [name], MD  Insulin Pen Needle (PEN NEEDLES 3/16") 31G X 5 MM MISC Use as per instructions 3 times a day. 12/18/16   [name], MD  metFORMIN (GLUCOPHAGE) 1000 MG tablet Take 1 tablet (1,000 mg total) by mouth 2 (two) times daily with a meal. 12/18/16   [name], MD  methocarbamol (ROBAXIN) 500 MG tablet Take 1 tablet (500 mg total) by mouth 4 (four) times daily. Patient not taking: Reported on 12/05/2017 03/31/17   McVey, Gelene Mink, PA-C   Allergies  Allergen Reactions  . Bee Venom Swelling  .  Keflex [Cephalexin]     UPSET STOMACH  . Relafen [Nabumetone]     Blood in stool  . Shellfish Allergy Nausea Only   STUDIES:  CXR: no active disease  Significant Labs :  VBG: 6.03/08/43/5 AG 24 Lactic Acid 4.69 WBC 15.6   Assessment and Plan:  42 yr old obese male with PMHx DM on Metformin presents in Anion Gap Metabolic Acidosis ( AG =70).  Admit to ICU Full consult to follow  AG Metabolic Acidosis from : 1. DKA-  Reason for DKA possibly a combination of noncompliance and recent GI illness. SOB most likely Kussmaul's ( supp O2 to keep Sat >92%). check beta  hydroxybutyrate, Hgb A1c, Insulin ggt with gluc stabilizer, IVF, BMP Q4. When VBG ph is corrected for ABG pt is still <7.2 Bicarb on BMET 11. Start on Bicarb ggt..  2. Lactic acidosis : pt is on metformin as an outpatient- hold medication while in patient. Continues on IVF. Trend Lactate.  3. H/o Abdominal pain, Nausea and vomiting - transaminases are not elevated. If Lactate increases or does not clear pt may benefit from CTA/P r/o intra-abdominal process 4. AKI: most likely from dehydration continue IVF- monitor UOP 5. Sepsis: leukocytosis, lactic acid and initially hypotensive- source unknown:  f/u cultures. UA leuk and nitrite negative, CXR has no signs of consolidation.  6. Prolonged QT 471: Avoid other medications that might prolong QT   I, Dr Seward Carol have personally reviewed patient's available data, including medical history, events of note,as part of preliminary evaluation. The patient is critically ill with multiple organ systems failure and requires high complexity decision making for assessment and support, frequent evaluation and titration of therapies, application of advanced monitoring technologies and extensive interpretation of multiple databases. Admission orders to ICU and initial mgmt initiated  A full PCCM consult to follow.  Signed Dr Seward Carol Pulmonary Critical Care Locums

## 2017-12-05 NOTE — H&P (Signed)
PULMONARY / CRITICAL CARE MEDICINE   Name: Marc Long MRN: [id number] DOB: 12-24-1975    ADMISSION DATE:  12/05/2017 CONSULTATION DATE:  12/05/17  REFERRING MD:  Dr Florina Ou  CHIEF COMPLAINT:  Dyspnea  HISTORY OF PRESENT ILLNESS:   42 year old man, never smoker, with a history of bipolar disorder (on lithium), diabetes, obesity, gallstones, depression.  Managed at home with metformin, insulin.  Poor p.o. intake with some nausea and vomiting for about the last 2 weeks.  24 hours prior to admission has had progressive confusion and shortness of breath.  Brought to the emergency department early 6/22, found to have a profound metabolic acidosis with evidence for DKA and also an elevated lactate.  PAST MEDICAL HISTORY :  He  has a past medical history of Depression, Diabetes mellitus without complication ([place]), Gallstones, and Obesity, Class III, BMI 40-49.9 (morbid obesity) ([place]).  PAST SURGICAL HISTORY: He  has no past surgical history on file.  Allergies  Allergen Reactions  . Bee Venom Swelling  . Keflex [Cephalexin]     UPSET STOMACH  . Relafen [Nabumetone]     Blood in stool  . Shellfish Allergy Nausea Only    No current facility-administered medications on file prior to encounter.    Current Outpatient Medications on File Prior to Encounter  Medication Sig  . busPIRone (BUSPAR) 10 MG tablet Take 10 mg by mouth 3 (three) times daily.  . Exenatide ER (BYDUREON BCISE) 2 MG/0.85ML AUIJ Inject 2 mg into the skin once a week.  Marland Kitchen FLUoxetine (PROZAC) 20 MG capsule Take 20 mg by mouth daily.  . Insulin Glargine (LANTUS SOLOSTAR) 100 UNIT/ML Solostar Pen Inject 15 Units into the skin daily at 10 pm. **NEEDS OFFICE VISIT FOR ANY ADDITIONAL REFILLS**  . insulin lispro (HUMALOG KWIKPEN) 100 UNIT/ML KiwkPen Inject 0-0.15 mLs (0-15 Units total) into the skin 3 (three) times daily with meals. CBG < 70: eat or drink something sweet and recheck. (Patient taking differently: Inject 0-15 Units  into the skin 3 (three) times daily with meals. ONLY USES IF BS ARE OVER 120  CBG < 70: eat or drink something sweet and recheck.)  . lamoTRIgine (LAMICTAL) 200 MG tablet TAKE 200 MG BY MOUTH ONCE DAILY  . lithium carbonate (LITHOBID) 300 MG CR tablet Take 3 (900 MG) tablets by mouth daily  . LORazepam (ATIVAN) 0.5 MG tablet Take 0.5 mg by mouth every 8 (eight) hours as needed for anxiety.   . pramipexole (MIRAPEX) 0.25 MG tablet Take 0.75 mg by mouth at bedtime.  Marland Kitchen ACCU-CHEK GUIDE test strip USE UP TO 4 TIMES EVERY DAY AS DIRECTED  . blood glucose meter kit and supplies Dispense based on patient and insurance preference. Use up to four times daily as directed. (FOR ICD-9 250.00, 250.01).  . dapagliflozin propanediol (FARXIGA) 10 MG TABS tablet Take 10 mg by mouth daily. (Patient not taking: Reported on 03/31/2017)  . Insulin Glargine (LANTUS SOLOSTAR) 100 UNIT/ML Solostar Pen Inject 20 Units into the skin daily. (Patient not taking: Reported on 12/05/2017)  . Insulin Pen Needle (PEN NEEDLES 3/16") 31G X 5 MM MISC Use as per instructions 3 times a day.  . metFORMIN (GLUCOPHAGE) 1000 MG tablet Take 1 tablet (1,000 mg total) by mouth 2 (two) times daily with a meal.  . methocarbamol (ROBAXIN) 500 MG tablet Take 1 tablet (500 mg total) by mouth 4 (four) times daily. (Patient not taking: Reported on 12/05/2017)    FAMILY HISTORY:  His indicated that his mother is alive.  He indicated that his father is alive. He indicated that his maternal grandmother is deceased. He indicated that his maternal grandfather is deceased.   SOCIAL HISTORY: He  reports that he has never smoked. He has never used smokeless tobacco. He reports that he drinks alcohol. He reports that he does not use drugs.  REVIEW OF SYSTEMS:   Difficult to obtain.  Patient does note shortness of breath  SUBJECTIVE:  To the ICU, currently on insulin drip, aggressive IV fluids  VITAL SIGNS: BP 133/85   Pulse (!) 120   Temp 98.8 F  (37.1 C) (Rectal)   Resp (!) 33   Ht _0  (1.854 m)   Wt (!) 146.5 kg (323 lb)   SpO2 99%   BMI 42.61 kg/m   HEMODYNAMICS:    VENTILATOR SETTINGS:    INTAKE / OUTPUT: No intake/output data recorded.  PHYSICAL EXAMINATION: General: Ill-appearing man, lying in bed, tachypneic Neuro: Awake, able to answer simple questions, follows commands, oriented to self, place, not time HEENT: oropharynx dry, pupils equal Cardiovascular: Tachycardic, regular, 100s Lungs: Clear bilaterally, tachypneic, no wheezing Abdomen: Soft, benign, nontender, positive bowel sounds Musculoskeletal: No deformities, extremities cool Skin: No rash  LABS:  BMET Recent Labs  Lab 12/05/17 0243 12/05/17 0632  NA 137 142  K 3.9 4.3  CL 102 110  CO2 11* 7*  BUN 13 14  CREATININE 1.78* 1.56*  GLUCOSE 481* 358*    Electrolytes Recent Labs  Lab 12/05/17 0243 12/05/17 0632  CALCIUM 9.1 8.4*  MG  --  2.3  PHOS  --  5.2*    CBC Recent Labs  Lab 12/05/17 0243  WBC 15.6*  HGB 16.1  HCT 48.1  PLT 342    Coag's No results for input(s): APTT, INR in the last 168 hours.  Sepsis Markers Recent Labs  Lab 12/05/17 0253 12/05/17 0632  LATICACIDVEN 4.69* 3.9*  PROCALCITON  --  0.38    ABG Recent Labs  Lab 12/05/17 0545  PHART 7.085*  PCO2ART BELOW REPORTABLE RANGE  PO2ART 140*    Liver Enzymes Recent Labs  Lab 12/05/17 0243  AST 24  ALT 27  ALKPHOS 101  BILITOT 1.1  ALBUMIN 4.2    Cardiac Enzymes No results for input(s): TROPONINI, PROBNP in the last 168 hours.  Glucose Recent Labs  Lab 12/05/17 0229 12/05/17 0420 12/05/17 0533 12/05/17 0631  GLUCAP 483* 419* 381* 334*    Imaging Dg Chest Port 1 View  Result Date: 12/05/2017 CLINICAL DATA:  Initial evaluation for acute shortness of breath. EXAM: PORTABLE CHEST 1 VIEW COMPARISON:  None. FINDINGS: The cardiac and mediastinal silhouettes are within normal limits. The lungs are hypoinflated. No airspace  consolidation, pleural effusion, or pulmonary edema is identified. There is no pneumothorax. No acute osseous abnormality identified. IMPRESSION: No active disease. Electronically Signed   By: Jeannine Boga M.D.   On: 12/05/2017 02:55     STUDIES:  Chest x-ray 6/22 >> no infiltrates, no acute abnormality CT abdomen/pelvis 6/22 >>   CULTURES: Blood 6/22 >>  Urine 6/22 >>   ANTIBIOTICS:  SIGNIFICANT EVENTS:  LINES/TUBES:  DISCUSSION: 42 year old man with obesity, bipolar disease, diabetes.  He presents with DKA, associated tachypnea and respiratory distress, elevated lactate that I suspect is due to his work of breathing.  He admits to not taking his insulin reliably at home  ASSESSMENT / PLAN: ENDOCRINE A:   DKA P:   Aggressive IV fluids, convert to dextrose containing fluids once CBG less than  250 Insulin drip, continue until anion gap and bicarb normalized Metformin held  ABG as needed  PULMONARY A: Acute respiratory failure, tachypnea.  Suspect related to compensation for his profound metabolic acidosis.  Chest x-ray reassuring P:   Supportive care.  He is tolerated the work of breathing at this point. Follow respiratory status closely.  Could consider BiPAP support if he needs this temporarily until his metabolic acidosis is improving.  No clear indication for intubation now  CARDIOVASCULAR A:  Hypertension P:  Hydralazine as needed  RENAL A:   Acute renal failure Anion gap metabolic acidosis; DKA plus elevated lactate.   P:   Treat DKA as above Follow lactate for clearance Question a component of metformin as a contributor to his lactate, currently held Continue his current sodium bicarbonate infusion until this bag ends, then stop Follow urine output, BMP.  Suspect that renal function will normalize with restoration of adequate perfusion Suspect lactic acidosis is due to his profound tachypnea but need to be mindful of possible occult ischemia if  fails to clear with aggressive volume resuscitation  GASTROINTESTINAL A:   Recent nausea, vomiting P:   N.p.o. at this time Pepcid for acid prophylaxis CT abdomen was ordered in the emergency department, I believe we can defer for now as his abdominal exam is benign.  If his lactate does not clear as anticipated then I will reorder  HEMATOLOGIC A:   Leukocytosis P:  Follow CBC, anticipate resolution with treatment of his acute illness Heparin for DVT prophylaxis  INFECTIOUS A:   No current evidence for acute infection although with elevated lactate consider occult abdominal process P:   Blood and urine cultures ordered 6/22, follow Consider abdominal imaging if lactate fails to clear   NEUROLOGIC A:   Acute metabolic encephalopathy P:   RASS goal: n/a Minimize any sedating medications Frequent orientation Treat underlying metabolic process   FAMILY  - Updates: None present 6/22 morning  - Inter-disciplinary family meet or Palliative Care meeting due by: 12/12/17  Independent CC time 40 minutes  Baltazar Apo, MD, PhD 12/05/2017, 8:18 AM [facility] [phone] or if no answer [phone]

## 2017-12-05 NOTE — ED Notes (Signed)
Blood draw attempted x4 by 3 different staff members. Phlebotomy team paged.

## 2017-12-05 NOTE — ED Notes (Signed)
Pt disoriented and trying to get out of bed. PT assisted back into bed with assistance from staff.

## 2017-12-05 NOTE — Progress Notes (Signed)
Pt more alert to answer admission assessment questions. Upon suicidal assessment pt admitted to thoughts of harming himself. When that was determined this RN continued with the [place] Suicide Severity Rating Scale. Pt answers revealed a high risk for suicide. IP Suicide Precautions Interventions implemented. MD paged. Charge RN and [facility] made aware. Pt states not suicidal at this very moment. Pt does admit to using diabetes as his means for suicide. Pt states he feels tired and that this is complicated. Will continue to monitor pt and provide support.

## 2017-12-05 NOTE — Progress Notes (Signed)
Clothing sent home with roommate. Pt aware.

## 2017-12-06 ENCOUNTER — Inpatient Hospital Stay (HOSPITAL_COMMUNITY): Payer: Medicaid Other

## 2017-12-06 ENCOUNTER — Encounter (HOSPITAL_COMMUNITY): Payer: Self-pay

## 2017-12-06 LAB — BASIC METABOLIC PANEL
ANION GAP: 12 (ref 5–15)
Anion gap: 11 (ref 5–15)
Anion gap: 10 (ref 5–15)
Anion gap: 11 (ref 5–15)
Anion gap: 8 (ref 5–15)
BUN: 12 mg/dL (ref 6–20)
BUN: 12 mg/dL (ref 6–20)
BUN: 10 mg/dL (ref 6–20)
BUN: 9 mg/dL (ref 6–20)
BUN: 9 mg/dL (ref 6–20)
CALCIUM: 8.8 mg/dL — AB (ref 8.9–10.3)
CALCIUM: 8.9 mg/dL (ref 8.9–10.3)
CHLORIDE: 116 mmol/L — AB (ref 101–111)
CHLORIDE: 115 mmol/L — AB (ref 101–111)
CHLORIDE: 110 mmol/L (ref 101–111)
CHLORIDE: 108 mmol/L (ref 101–111)
CHLORIDE: 108 mmol/L (ref 101–111)
CO2: 19 mmol/L — ABNORMAL LOW (ref 22–32)
CO2: 21 mmol/L — AB (ref 22–32)
CO2: 18 mmol/L — AB (ref 22–32)
CO2: 19 mmol/L — ABNORMAL LOW (ref 22–32)
CO2: 21 mmol/L — AB (ref 22–32)
CREATININE: 1.13 mg/dL (ref 0.61–1.24)
CREATININE: 1.21 mg/dL (ref 0.61–1.24)
CREATININE: 1.1 mg/dL (ref 0.61–1.24)
CREATININE: 1.12 mg/dL (ref 0.61–1.24)
CREATININE: 1.08 mg/dL (ref 0.61–1.24)
Calcium: 8.8 mg/dL — ABNORMAL LOW (ref 8.9–10.3)
Calcium: 9 mg/dL (ref 8.9–10.3)
Calcium: 8.9 mg/dL (ref 8.9–10.3)
GFR calc Af Amer: >60 mL/min (ref >60)
GFR calc Af Amer: >60 mL/min (ref >60)
GFR calc non Af Amer: >60 mL/min (ref >60)
GFR calc non Af Amer: >60 mL/min (ref >60)
GFR calc non Af Amer: >60 mL/min (ref >60)
GFR calc non Af Amer: >60 mL/min (ref >60)
GLUCOSE: 215 mg/dL — AB (ref 65–99)
Glucose, Bld: 158 mg/dL — ABNORMAL HIGH (ref 65–99)
Glucose, Bld: 314 mg/dL — ABNORMAL HIGH (ref 65–99)
Glucose, Bld: 378 mg/dL — ABNORMAL HIGH (ref 65–99)
Glucose, Bld: 448 mg/dL — ABNORMAL HIGH (ref 65–99)
POTASSIUM: 3 mmol/L — AB (ref 3.5–5.1)
Potassium: 3.2 mmol/L — ABNORMAL LOW (ref 3.5–5.1)
Potassium: 2.7 mmol/L — CL (ref 3.5–5.1)
Potassium: 3.3 mmol/L — ABNORMAL LOW (ref 3.5–5.1)
Potassium: 3.3 mmol/L — ABNORMAL LOW (ref 3.5–5.1)
SODIUM: 146 mmol/L — AB (ref 135–145)
SODIUM: 138 mmol/L (ref 135–145)
SODIUM: 137 mmol/L (ref 135–145)
Sodium: 146 mmol/L — ABNORMAL HIGH (ref 135–145)
Sodium: 140 mmol/L (ref 135–145)

## 2017-12-06 LAB — PHOSPHORUS: PHOSPHORUS: 1.8 mg/dL — AB (ref 2.5–4.6)

## 2017-12-06 LAB — GLUCOSE, CAPILLARY
GLUCOSE-CAPILLARY: 126 mg/dL — AB (ref 65–99)
GLUCOSE-CAPILLARY: 175 mg/dL — AB (ref 65–99)
GLUCOSE-CAPILLARY: 194 mg/dL — AB (ref 65–99)
GLUCOSE-CAPILLARY: 193 mg/dL — AB (ref 65–99)
GLUCOSE-CAPILLARY: 153 mg/dL — AB (ref 65–99)
GLUCOSE-CAPILLARY: 269 mg/dL — AB (ref 65–99)
GLUCOSE-CAPILLARY: 279 mg/dL — AB (ref 65–99)
GLUCOSE-CAPILLARY: 366 mg/dL — AB (ref 65–99)
Glucose-Capillary: 139 mg/dL — ABNORMAL HIGH (ref 65–99)
Glucose-Capillary: 157 mg/dL — ABNORMAL HIGH (ref 65–99)
Glucose-Capillary: 159 mg/dL — ABNORMAL HIGH (ref 65–99)
Glucose-Capillary: 160 mg/dL — ABNORMAL HIGH (ref 65–99)
Glucose-Capillary: 164 mg/dL — ABNORMAL HIGH (ref 65–99)
Glucose-Capillary: 312 mg/dL — ABNORMAL HIGH (ref 65–99)
Glucose-Capillary: 402 mg/dL — ABNORMAL HIGH (ref 65–99)

## 2017-12-06 LAB — LACTIC ACID, PLASMA: Lactic Acid, Venous: 1 mmol/L (ref 0.5–1.9)

## 2017-12-06 LAB — URINE CULTURE: Culture: <10000 — AB

## 2017-12-06 LAB — CBC
HCT: 35.4 % — ABNORMAL LOW (ref 39.0–52.0)
HEMOGLOBIN: 12.2 g/dL — AB (ref 13.0–17.0)
MCH: 29 pg (ref 26.0–34.0)
MCHC: 34.5 g/dL (ref 30.0–36.0)
MCV: 84.1 fL (ref 78.0–100.0)
Platelets: 201 10*3/uL (ref 150–400)
RBC: 4.21 MIL/uL — AB (ref 4.22–5.81)
RDW: 15 % (ref 11.5–15.5)
WBC: 7 10*3/uL (ref 4.0–10.5)

## 2017-12-06 LAB — MAGNESIUM: Magnesium: 2.3 mg/dL (ref 1.7–2.4)

## 2017-12-06 LAB — PROCALCITONIN: Procalcitonin: 1.18 ng/mL

## 2017-12-06 MED ORDER — INSULIN ASPART 100 UNIT/ML ~~LOC~~ SOLN
0.0000 [IU] | Freq: Every day | SUBCUTANEOUS | Status: DC
  Administered 2017-12-06: 4 [IU] via SUBCUTANEOUS

## 2017-12-06 MED ORDER — CALCIUM CARBONATE ANTACID 500 MG PO CHEW
2.0000 | CHEWABLE_TABLET | ORAL | Status: AC
  Administered 2017-12-06: 400 mg via ORAL
  Filled 2017-12-06: qty 2

## 2017-12-06 MED ORDER — LAMOTRIGINE 100 MG PO TABS
200.0000 mg | ORAL_TABLET | Freq: Every day | ORAL | Status: DC
  Administered 2017-12-06 – 2017-12-10 (×6): 200 mg via ORAL
  Filled 2017-12-06 (×6): qty 2

## 2017-12-06 MED ORDER — INSULIN ASPART 100 UNIT/ML ~~LOC~~ SOLN
18.0000 [IU] | Freq: Once | SUBCUTANEOUS | Status: AC
  Administered 2017-12-06: 18 [IU] via SUBCUTANEOUS

## 2017-12-06 MED ORDER — INSULIN GLARGINE 100 UNIT/ML ~~LOC~~ SOLN
15.0000 [IU] | Freq: Once | SUBCUTANEOUS | Status: AC
  Administered 2017-12-06: 15 [IU] via SUBCUTANEOUS
  Filled 2017-12-06: qty 0.15

## 2017-12-06 MED ORDER — PRAMIPEXOLE DIHYDROCHLORIDE 0.25 MG PO TABS
0.7500 mg | ORAL_TABLET | Freq: Every day | ORAL | Status: DC
  Administered 2017-12-06 – 2017-12-09 (×4): 0.75 mg via ORAL
  Filled 2017-12-06 (×4): qty 3

## 2017-12-06 MED ORDER — FLUOXETINE HCL 20 MG PO CAPS
20.0000 mg | ORAL_CAPSULE | Freq: Every day | ORAL | Status: DC
Start: 2017-12-06 — End: 2017-12-08
  Administered 2017-12-06 – 2017-12-08 (×4): 20 mg via ORAL
  Filled 2017-12-06 (×4): qty 1

## 2017-12-06 MED ORDER — FAMOTIDINE 20 MG PO TABS
20.0000 mg | ORAL_TABLET | Freq: Two times a day (BID) | ORAL | Status: DC
  Administered 2017-12-06 – 2017-12-07 (×3): 20 mg via ORAL
  Filled 2017-12-06 (×3): qty 1

## 2017-12-06 MED ORDER — SODIUM CHLORIDE 0.9% FLUSH
10.0000 mL | Freq: Two times a day (BID) | INTRAVENOUS | Status: DC
  Administered 2017-12-06 – 2017-12-10 (×7): 10 mL

## 2017-12-06 MED ORDER — LITHIUM CARBONATE ER 450 MG PO TBCR
900.0000 mg | EXTENDED_RELEASE_TABLET | Freq: Every day | ORAL | Status: DC
  Administered 2017-12-06 – 2017-12-10 (×5): 900 mg via ORAL
  Filled 2017-12-06 (×5): qty 2

## 2017-12-06 MED ORDER — INSULIN ASPART 100 UNIT/ML ~~LOC~~ SOLN
0.0000 [IU] | Freq: Three times a day (TID) | SUBCUTANEOUS | Status: DC
  Administered 2017-12-06: 8 [IU] via SUBCUTANEOUS
  Administered 2017-12-06: 15 [IU] via SUBCUTANEOUS
  Administered 2017-12-07: 3 [IU] via SUBCUTANEOUS

## 2017-12-06 MED ORDER — INSULIN GLARGINE 100 UNIT/ML ~~LOC~~ SOLN
20.0000 [IU] | Freq: Every day | SUBCUTANEOUS | Status: DC
  Administered 2017-12-06 – 2017-12-07 (×2): 20 [IU] via SUBCUTANEOUS
  Filled 2017-12-06 (×3): qty 0.2

## 2017-12-06 MED ORDER — SODIUM CHLORIDE 0.9% FLUSH: 10.0000 mL | INTRAVENOUS | Status: DC | PRN

## 2017-12-06 MED ORDER — POTASSIUM CHLORIDE CRYS ER 20 MEQ PO TBCR
40.0000 meq | EXTENDED_RELEASE_TABLET | ORAL | Status: AC
  Administered 2017-12-06 (×2): 40 meq via ORAL
  Filled 2017-12-06 (×2): qty 2

## 2017-12-06 MED ORDER — BUSPIRONE HCL 10 MG PO TABS
10.0000 mg | ORAL_TABLET | Freq: Three times a day (TID) | ORAL | Status: DC
  Administered 2017-12-06 – 2017-12-10 (×14): 10 mg via ORAL
  Filled 2017-12-06 (×14): qty 1

## 2017-12-06 NOTE — Progress Notes (Addendum)
PULMONARY / CRITICAL CARE MEDICINE   Name: Marc Long MRN: [id number] DOB: 02-Sep-1975    ADMISSION DATE:  12/05/2017 CONSULTATION DATE:  12/05/17  REFERRING MD:  Dr Florina Ou  CHIEF COMPLAINT:  Dyspnea  HISTORY OF PRESENT ILLNESS:   42 year old man, never smoker, with a history of bipolar disorder (on lithium), diabetes, obesity, gallstones, depression.  Managed at home with metformin, insulin.  Poor p.o. intake with some nausea and vomiting for about the last 2 weeks.  24 hours prior to admission has had progressive confusion and shortness of breath.  Brought to the emergency department early 6/22, found to have a profound metabolic acidosis with evidence for DKA and also an elevated lactate.  SUBJECTIVE:  More awake, comfortable Reviewed his home medications with him today Suicidal ideation elicited last night. Denies desire to harm self at this time, sitter at bedside  VITAL SIGNS: BP 124/71   Pulse 70   Temp 98 F (36.7 C) (Oral)   Resp 14   Ht 6\' 1"  (1.854 m)   Wt 112.2 kg (247 lb 5.7 oz)   SpO2 96%   BMI 32.63 kg/m   HEMODYNAMICS:    VENTILATOR SETTINGS:    INTAKE / OUTPUT: I/O last 3 completed shifts: In: 2587.8 [I.V.:2587.8] Out: 1175 [Urine:1175]  PHYSICAL EXAMINATION: General: Chronically ill, lying in bed, comfortable respiratory pattern (improved Neuro: Awake, oriented, follows commands, able to answer questions appropriately HEENT: [place] moist, pupils equal Cardiovascular: Regular, no murmur Lungs: Fair bilaterally, no wheezes or crackles Abdomen: Soft, benign, nontender, positive bowel sounds Musculoskeletal: No deformity Skin: No rash  LABS:  BMET Recent Labs  Lab 12/05/17 1833 12/05/17 2126 12/06/17 0202  NA 145 145 146*  K 3.2* 3.1* 3.2*  CL 115* 115* 116*  CO2 14* 17* 19*  BUN 13 13 12   CREATININE 1.28* 1.32* 1.13  GLUCOSE 184* 195* 158*    Electrolytes Recent Labs  Lab 12/05/17 0632  12/05/17 1833 12/05/17 2126  12/06/17 0202  CALCIUM 8.4*   < > 8.8* 8.9 8.8*  MG 2.3  --   --   --  2.3  PHOS 5.2*  --   --   --  1.8*   < > = values in this interval not displayed.    CBC Recent Labs  Lab 12/05/17 0243 12/06/17 0202  WBC 15.6* 7.0  HGB 16.1 12.2*  HCT 48.1 35.4*  PLT 342 201    Coag's No results for input(s): APTT, INR in the last 168 hours.  Sepsis Markers Recent Labs  Lab 12/05/17 0632 12/05/17 0951 12/06/17 0202  LATICACIDVEN 3.9* 3.8* 1.0  PROCALCITON 0.38  --  1.18    ABG Recent Labs  Lab 12/05/17 0545  PHART 7.085*  PCO2ART BELOW REPORTABLE RANGE  PO2ART 140*    Liver Enzymes Recent Labs  Lab 12/05/17 0243  AST 24  ALT 27  ALKPHOS 101  BILITOT 1.1  ALBUMIN 4.2    Cardiac Enzymes No results for input(s): TROPONINI, PROBNP in the last 168 hours.  Glucose Recent Labs  Lab 12/06/17 0207 12/06/17 0315 12/06/17 0427 12/06/17 0536 12/06/17 0634 12/06/17 0743  GLUCAP 159* 139* 126* 175* 194* 193*    Imaging Dg Chest Port 1 View  Result Date: 12/06/2017 CLINICAL DATA:  Tachypnea. EXAM: PORTABLE CHEST 1 VIEW COMPARISON:  12/05/2017 FINDINGS: The heart size and mediastinal contours are within normal limits. Both lungs are clear. No pleural effusion or pneumothorax. The visualized skeletal structures are unremarkable. IMPRESSION: No active disease. Electronically Signed  By: Lajean Manes M.D.   On: 12/06/2017 07:29     STUDIES:  Chest x-ray 6/22 >> no infiltrates, no acute abnormality CT abdomen/pelvis 6/22 >>   CULTURES: Blood 6/22 >>  Urine 6/22 >>   ANTIBIOTICS:  SIGNIFICANT EVENTS:  LINES/TUBES:  DISCUSSION: 42 year old man with obesity, bipolar disease, diabetes.  He presents with DKA, associated tachypnea and respiratory distress, elevated lactate that I suspect is due to his work of breathing.  He admits to not taking his insulin reliably at home  ASSESSMENT / PLAN: ENDOCRINE A:   DKA, improving P:   Continue current IV fluids  and insulin until CO2 greater than 20, anion gap is closed He is supposed to be on Lantus, exenatide, dapagliflozin at home.  Not entirely clear what he usually takes.  Will restart Lantus when he is ready to wean off the insulin drip and defer to the others for now until we clarify whether he uses reliably. Metformin on hold, add back when he is taking good oral diet  PULMONARY A: Acute respiratory failure, tachypnea.  Suspect related to compensation for his profound metabolic acidosis.  Chest x-ray reassuring.  Much improved P:   Pulmonary hygiene  CARDIOVASCULAR A:  Hypertension P:  Hydralazine as needed Not on a home antihypertensive regimen, may need to consider depending on resolution with correction of his metabolic status  RENAL A:   Acute renal failure, improving Anion gap metabolic acidosis; DKA plus elevated lactate.   Hypokalemia P:   Continue treatment DKA as above Lactate is cleared, continue to hold metformin for now  GASTROINTESTINAL A:   Recent nausea, vomiting.  Appears to be improved P:   Okay for water, sips with meds.  Restart clear diet when he is off insulin drip Pepcid for acid prophylaxis No indication for CT abdomen at this time, canceled   HEMATOLOGIC A:   Leukocytosis, resolved P:  Ali CBC Heparin for DVT prophylaxis  INFECTIOUS A:   No current evidence for acute infection P:   Blood and urine cultures ordered 6/22, follow, negative so far   NEUROLOGIC A:   Acute metabolic encephalopathy, improved Bipolar disorder P:   RASS goal: n/a Careful with any sedating medications, hold home Robaxin Restart home BuSpar, Prozac, Lamictal, lithium on 6/23 Frequent orientation He will likely need psych eval once metabolic stable. Sitter remains at bedside    FAMILY  - Updates: family updated at bedside 6/23  - Inter-disciplinary family meet or Palliative Care meeting due by: 12/12/17  Independent CC time 31 minutes  Baltazar Apo, MD,  PhD 12/06/2017, 8:19 AM [facility] [phone] or if no answer [phone]

## 2017-12-06 NOTE — Progress Notes (Signed)
Patients CBG 402 RN updated Dr. Lamonte Sakai on patients increasing blood sugars.  RN received an order for a one time dose of Novolog 18 units and Lantus 15 units.  Novolog to be given at this time and the Lantus to be administered at 2000.  RN placed orders.  Will continue to monitor.

## 2017-12-07 DIAGNOSIS — Z818 Family history of other mental and behavioral disorders: Secondary | ICD-10-CM

## 2017-12-07 DIAGNOSIS — F314 Bipolar disorder, current episode depressed, severe, without psychotic features: Secondary | ICD-10-CM

## 2017-12-07 DIAGNOSIS — Z79899 Other long term (current) drug therapy: Secondary | ICD-10-CM

## 2017-12-07 LAB — BASIC METABOLIC PANEL
ANION GAP: 7 (ref 5–15)
ANION GAP: 10 (ref 5–15)
BUN: 7 mg/dL (ref 6–20)
BUN: 7 mg/dL (ref 6–20)
CHLORIDE: 109 mmol/L (ref 101–111)
CHLORIDE: 110 mmol/L (ref 101–111)
CO2: 22 mmol/L (ref 22–32)
CO2: 21 mmol/L — ABNORMAL LOW (ref 22–32)
Calcium: 8.8 mg/dL — ABNORMAL LOW (ref 8.9–10.3)
Calcium: 9 mg/dL (ref 8.9–10.3)
Creatinine, Ser: 1.02 mg/dL (ref 0.61–1.24)
Creatinine, Ser: 1.04 mg/dL (ref 0.61–1.24)
Glucose, Bld: 210 mg/dL — ABNORMAL HIGH (ref 65–99)
Glucose, Bld: 197 mg/dL — ABNORMAL HIGH (ref 65–99)
POTASSIUM: 3.2 mmol/L — AB (ref 3.5–5.1)
Potassium: 2.8 mmol/L — ABNORMAL LOW (ref 3.5–5.1)
SODIUM: 138 mmol/L (ref 135–145)
Sodium: 141 mmol/L (ref 135–145)

## 2017-12-07 LAB — BLOOD GAS, ARTERIAL
DRAWN BY: 514251
FIO2: 21
O2 Saturation: 97 %
Patient temperature: 98.6
pH, Arterial: 7.085 — CL (ref 7.350–7.450)
pO2, Arterial: 140 mmHg — ABNORMAL HIGH (ref 83.0–108.0)

## 2017-12-07 LAB — BLOOD GAS, VENOUS
ACID-BASE DEFICIT: 27.5 mmol/L — AB (ref 0.0–2.0)
BICARBONATE: 5.6 mmol/L — AB (ref 20.0–28.0)
DRAWN BY: 51425
FIO2: 21
O2 Saturation: 66.7 %
PATIENT TEMPERATURE: 98.6
pCO2, Ven: 26.8 mmHg — ABNORMAL LOW (ref 44.0–60.0)
pH, Ven: 6.949 — CL (ref 7.250–7.430)
pO2, Ven: 48.9 mmHg — ABNORMAL HIGH (ref 32.0–45.0)

## 2017-12-07 LAB — CBC
HCT: 37.2 % — ABNORMAL LOW (ref 39.0–52.0)
HEMOGLOBIN: 12.9 g/dL — AB (ref 13.0–17.0)
MCH: 29.2 pg (ref 26.0–34.0)
MCHC: 34.7 g/dL (ref 30.0–36.0)
MCV: 84.2 fL (ref 78.0–100.0)
PLATELETS: 146 10*3/uL — AB (ref 150–400)
RBC: 4.42 MIL/uL (ref 4.22–5.81)
RDW: 15.3 % (ref 11.5–15.5)
WBC: 4.8 10*3/uL (ref 4.0–10.5)

## 2017-12-07 LAB — GLUCOSE, CAPILLARY
GLUCOSE-CAPILLARY: 199 mg/dL — AB (ref 65–99)
GLUCOSE-CAPILLARY: 327 mg/dL — AB (ref 65–99)
GLUCOSE-CAPILLARY: 254 mg/dL — AB (ref 65–99)
Glucose-Capillary: 259 mg/dL — ABNORMAL HIGH (ref 65–99)

## 2017-12-07 LAB — PROCALCITONIN: PROCALCITONIN: 0.53 ng/mL

## 2017-12-07 LAB — MAGNESIUM: MAGNESIUM: 2.2 mg/dL (ref 1.7–2.4)

## 2017-12-07 MED ORDER — GLUCERNA SHAKE PO LIQD
237.0000 mL | Freq: Three times a day (TID) | ORAL | Status: DC
  Administered 2017-12-08 – 2017-12-10 (×8): 237 mL via ORAL
  Filled 2017-12-07 (×10): qty 237

## 2017-12-07 MED ORDER — PANTOPRAZOLE SODIUM 40 MG PO TBEC
40.0000 mg | DELAYED_RELEASE_TABLET | Freq: Two times a day (BID) | ORAL | Status: DC
  Administered 2017-12-07 – 2017-12-10 (×7): 40 mg via ORAL
  Filled 2017-12-07 (×7): qty 1

## 2017-12-07 MED ORDER — INSULIN ASPART 100 UNIT/ML ~~LOC~~ SOLN
0.0000 [IU] | Freq: Every day | SUBCUTANEOUS | Status: DC
  Administered 2017-12-07 – 2017-12-08 (×2): 3 [IU] via SUBCUTANEOUS
  Administered 2017-12-09: 2 [IU] via SUBCUTANEOUS

## 2017-12-07 MED ORDER — INSULIN ASPART 100 UNIT/ML ~~LOC~~ SOLN
0.0000 [IU] | Freq: Three times a day (TID) | SUBCUTANEOUS | Status: DC
  Administered 2017-12-07: 11 [IU] via SUBCUTANEOUS
  Administered 2017-12-07: 15 [IU] via SUBCUTANEOUS
  Administered 2017-12-08 (×2): 7 [IU] via SUBCUTANEOUS
  Administered 2017-12-08: 15 [IU] via SUBCUTANEOUS
  Administered 2017-12-09 (×2): 11 [IU] via SUBCUTANEOUS
  Administered 2017-12-09 – 2017-12-10 (×2): 4 [IU] via SUBCUTANEOUS
  Administered 2017-12-10: 7 [IU] via SUBCUTANEOUS

## 2017-12-07 MED ORDER — POTASSIUM CHLORIDE CRYS ER 20 MEQ PO TBCR
30.0000 meq | EXTENDED_RELEASE_TABLET | ORAL | Status: AC
  Administered 2017-12-07 (×2): 30 meq via ORAL
  Filled 2017-12-07 (×2): qty 1

## 2017-12-07 MED ORDER — INSULIN ASPART 100 UNIT/ML ~~LOC~~ SOLN
4.0000 [IU] | Freq: Three times a day (TID) | SUBCUTANEOUS | Status: DC
  Administered 2017-12-07 – 2017-12-08 (×3): 4 [IU] via SUBCUTANEOUS

## 2017-12-07 MED ORDER — FAMOTIDINE 20 MG PO TABS
40.0000 mg | ORAL_TABLET | Freq: Every day | ORAL | Status: DC
  Administered 2017-12-07 – 2017-12-09 (×3): 40 mg via ORAL
  Filled 2017-12-07 (×3): qty 2

## 2017-12-07 NOTE — Progress Notes (Signed)
PATIENT AND BELONGINGS, INCLUDING CELL PHONE TAKEN WITH THE PATIENT FOR TRANSFER TO THE ROOM 1518; CARE RELINQUISHED

## 2017-12-07 NOTE — Consult Note (Signed)
[facility] Face-to-Face Psychiatry Consult   Reason for Consult:  SI Referring Physician:  Dr. Halford Chessman Patient Identification: Marc [place] MRN:  [id number] Principal Diagnosis: Depression with suicidal ideation Diagnosis:   Patient Active Problem List   Diagnosis Date Noted  . DKA (diabetic ketoacidoses) ([place]) [E13.10] 12/05/2017  . Hypertriglyceridemia [E78.1] 02/05/2017  . DM (diabetes mellitus), type 2, uncontrolled ([place]) [E11.65] 12/12/2016  . Dehydration [E86.0] 12/12/2016  . Suicidal ideations [R45.851] 12/12/2016  . Bipolar disorder (manic depression) ([place]) [F31.9] 12/12/2016  . Hyperglycemia due to type 2 diabetes mellitus ([place]) [E11.65] 12/12/2016  . AKI (acute kidney injury) ([place]) [N17.9] 12/12/2016  . DKA, type 2, not at goal [facility]) [E11.10] 12/12/2016  . Obesity, Class III, BMI 40-49.9 (morbid obesity) ([place]) [E66.01]   . Type 2 diabetes mellitus without complication, without long-term current use of insulin ([place]) [E11.9] 12/10/2016  . Lithium use [Z79.899] 12/10/2016  . Moderate single current episode of major depressive disorder ([place]) [F32.1] 04/15/2016  . Pain in joint, shoulder region [M25.519] 04/14/2016  . Diverticulosis of large intestine without hemorrhage [K57.30] 11/07/2015  . Calculus of gallbladder without cholecystitis [K80.20] 11/07/2015    Total Time spent with patient: 1 hour  Subjective:   Marc [place] is a 42 y.o. male patient admitted with DKA.  HPI:   Per chart review, patient was admitted with DKA. He has a history of bipolar disorder. He endorses SI so psychiatry was consulted. Home medications include Buspar 10 mg TID, Prozac 20 mg daily,  Lamictal 200 mg daily, Lithium 900 mg daily and Ativan 0.5 mg TID PRN (not prescribed in the hospital). PMP indicates the last prescription for Ativan was filled in February.   On interview, Marc [place] endorses SI and reports that he has nothing to live for.  He reports onset of these thoughts 1.5 years ago in the setting of  "falling out of love with his girlfriend."  They were together for 3 years.  When asked about a plan, he reports that he plans to let diabetes kill him.  He reports that he has not been taking his medications.  He denies access to weapons or guns.  He denies a history of suicide attempts.  He denies a history of manic symptoms (euphoria, decreased need for sleep, increased energy or pressured speech) although he is diagnosed with bipolar disorder.  He reports that he was started on Lithium for suicidal thoughts.  He additionally endorses hypersomnia and sleeps up to 10 hours daily.  He still feels tired when he wakes up.  He reports anhedonia.  He denies problems with appetite.  He sees a Teacher, music and therapist at the [facility].  He last saw his therapist in [place] due to lack of transportation.  He is unable to safety plan and does not know if he would harm himself if he were discharged from the hospital.  Past Psychiatric History: Depression and bipolar disorder   Risk to Self:  Yes. Endorses SI.  Risk to Others:  None. Denies HI.  Prior Inpatient Therapy:  Denies  Prior Outpatient Therapy:  [place]   Past Medical History:  Past Medical History:  Diagnosis Date  . Depression   . Diabetes mellitus without complication ([place])   . Gallstones   . Obesity, Class III, BMI 40-49.9 (morbid obesity) ([place])    History reviewed. No pertinent surgical history. Family History:  Family History  Problem Relation Age of Onset  . Hypertension Mother   . Depression Mother   .  Heart attack Maternal Grandfather   . Lymphoma Maternal Grandmother   . Diabetes Maternal Aunt   . Cancer Maternal Aunt    Family Psychiatric  History: Denies  Social History:  Social History   Substance and Sexual Activity  Alcohol Use Yes  . Alcohol/week: 0.0 oz   Comment: once every 2 weeks. Wine coolers     Social History   Substance and Sexual Activity  Drug Use No    Social History    Socioeconomic History  . Marital status: Single    Spouse name: Not on file  . Number of children: 0  . Years of education: Not on file  . Highest education level: Not on file  Occupational History  . Occupation: call center  Social Needs  . Financial resource strain: Not on file  . Food insecurity:    Worry: Not on file    Inability: Not on file  . Transportation needs:    Medical: Not on file    Non-medical: Not on file  Tobacco Use  . Smoking status: Never Smoker  . Smokeless tobacco: Never Used  Substance and Sexual Activity  . Alcohol use: Yes    Alcohol/week: 0.0 oz    Comment: once every 2 weeks. Wine coolers  . Drug use: No  . Sexual activity: Yes    Birth control/protection: None  Lifestyle  . Physical activity:    Days per week: Not on file    Minutes per session: Not on file  . Stress: Not on file  Relationships  . Social connections:    Talks on phone: Not on file    Gets together: Not on file    Attends religious service: Not on file    Active member of club or organization: Not on file    Attends meetings of clubs or organizations: Not on file    Relationship status: Not on file  Other Topics Concern  . Not on file  Social History Narrative  . Not on file   Additional Social History: He lives with 1 roommate. He is unemployed since February. He previously worked at [employer]. He reports social alcohol use. He denies illicit substance use.     Allergies:   Allergies  Allergen Reactions  . Bee Venom Swelling  . Keflex [Cephalexin]     UPSET STOMACH  . Relafen [Nabumetone]     Blood in stool  . Shellfish Allergy Nausea Only    Labs:  Results for orders placed or performed during the hospital encounter of 12/05/17 (from the past 48 hour(s))  Glucose, capillary     Status: Abnormal   Collection Time: 12/05/17 11:24 AM  Result Value Ref Range   Glucose-Capillary 126 (H) 65 - 99 mg/dL  Glucose, capillary     Status: Abnormal   Collection  Time: 12/05/17 12:25 PM  Result Value Ref Range   Glucose-Capillary 118 (H) 65 - 99 mg/dL  Glucose, capillary     Status: Abnormal   Collection Time: 12/05/17  1:24 PM  Result Value Ref Range   Glucose-Capillary 108 (H) 65 - 99 mg/dL  Basic metabolic panel     Status: Abnormal   Collection Time: 12/05/17  1:30 PM  Result Value Ref Range   Sodium 147 (H) 135 - 145 mmol/L   Potassium 3.2 (L) 3.5 - 5.1 mmol/L   Chloride 117 (H) 101 - 111 mmol/L   CO2 15 (L) 22 - 32 mmol/L   Glucose, Bld 125 (H) 65 - 99  mg/dL   BUN 12 6 - 20 mg/dL   Creatinine, Ser 1.21 0.61 - 1.24 mg/dL   Calcium 8.6 (L) 8.9 - 10.3 mg/dL   GFR calc non Af Amer >60 >60 mL/min   GFR calc Af Amer >60 >60 mL/min    Comment: (NOTE) The eGFR has been calculated using the CKD EPI equation. This calculation has not been validated in all clinical situations. eGFR's persistently <60 mL/min signify possible Chronic Kidney Disease.    Anion gap 15 5 - 15    Comment: Performed at [facility], [place] [address], [place], [place] [address]  Glucose, capillary     Status: Abnormal   Collection Time: 12/05/17  3:12 PM  Result Value Ref Range   Glucose-Capillary 131 (H) 65 - 99 mg/dL  Glucose, capillary     Status: Abnormal   Collection Time: 12/05/17  4:16 PM  Result Value Ref Range   Glucose-Capillary 157 (H) 65 - 99 mg/dL  Glucose, capillary     Status: Abnormal   Collection Time: 12/05/17  5:07 PM  Result Value Ref Range   Glucose-Capillary 163 (H) 65 - 99 mg/dL  Glucose, capillary     Status: Abnormal   Collection Time: 12/05/17  5:54 PM  Result Value Ref Range   Glucose-Capillary 167 (H) 65 - 99 mg/dL  Basic metabolic panel     Status: Abnormal   Collection Time: 12/05/17  6:33 PM  Result Value Ref Range   Sodium 145 135 - 145 mmol/L   Potassium 3.2 (L) 3.5 - 5.1 mmol/L   Chloride 115 (H) 101 - 111 mmol/L   CO2 14 (L) 22 - 32 mmol/L   Glucose, Bld 184 (H) 65 - 99 mg/dL   BUN 13 6 - 20 mg/dL    Creatinine, Ser 1.28 (H) 0.61 - 1.24 mg/dL   Calcium 8.8 (L) 8.9 - 10.3 mg/dL   GFR calc non Af Amer >60 >60 mL/min   GFR calc Af Amer >60 >60 mL/min    Comment: (NOTE) The eGFR has been calculated using the CKD EPI equation. This calculation has not been validated in all clinical situations. eGFR's persistently <60 mL/min signify possible Chronic Kidney Disease.    Anion gap 16 (H) 5 - 15    Comment: Performed at [facility], [place] [address] [place], [place] [address]  Glucose, capillary     Status: Abnormal   Collection Time: 12/05/17  7:02 PM  Result Value Ref Range   Glucose-Capillary 176 (H) 65 - 99 mg/dL   Comment 1 Notify RN    Comment 2 Document in Chart   Glucose, capillary     Status: Abnormal   Collection Time: 12/05/17  8:00 PM  Result Value Ref Range   Glucose-Capillary 167 (H) 65 - 99 mg/dL   Comment 1 Notify RN    Comment 2 Document in Chart   Glucose, capillary     Status: Abnormal   Collection Time: 12/05/17  9:04 PM  Result Value Ref Range   Glucose-Capillary 183 (H) 65 - 99 mg/dL   Comment 1 Notify RN    Comment 2 Document in Chart   Basic metabolic panel     Status: Abnormal   Collection Time: 12/05/17  9:26 PM  Result Value Ref Range   Sodium 145 135 - 145 mmol/L   Potassium 3.1 (L) 3.5 - 5.1 mmol/L   Chloride 115 (H) 101 - 111 mmol/L   CO2 17 (L) 22 -  32 mmol/L   Glucose, Bld 195 (H) 65 - 99 mg/dL   BUN 13 6 - 20 mg/dL   Creatinine, Ser 1.32 (H) 0.61 - 1.24 mg/dL   Calcium 8.9 8.9 - 10.3 mg/dL   GFR calc non Af Amer >60 >60 mL/min   GFR calc Af Amer >60 >60 mL/min    Comment: (NOTE) The eGFR has been calculated using the CKD EPI equation. This calculation has not been validated in all clinical situations. eGFR's persistently <60 mL/min signify possible Chronic Kidney Disease.    Anion gap 13 5 - 15    Comment: Performed at [facility], [place] [address], [place], [place] [address]  Glucose, capillary      Status: Abnormal   Collection Time: 12/05/17 10:05 PM  Result Value Ref Range   Glucose-Capillary 166 (H) 65 - 99 mg/dL   Comment 1 Notify RN    Comment 2 Document in Chart   Glucose, capillary     Status: Abnormal   Collection Time: 12/05/17 11:13 PM  Result Value Ref Range   Glucose-Capillary 156 (H) 65 - 99 mg/dL   Comment 1 Notify RN    Comment 2 Document in Chart   Glucose, capillary     Status: Abnormal   Collection Time: 12/06/17 12:10 AM  Result Value Ref Range   Glucose-Capillary 157 (H) 65 - 99 mg/dL   Comment 1 Notify RN    Comment 2 Document in Chart   Glucose, capillary     Status: Abnormal   Collection Time: 12/06/17  1:08 AM  Result Value Ref Range   Glucose-Capillary 160 (H) 65 - 99 mg/dL   Comment 1 Notify RN    Comment 2 Document in Chart   Procalcitonin     Status: None   Collection Time: 12/06/17  2:02 AM  Result Value Ref Range   Procalcitonin 1.18 ng/mL    Comment:        Interpretation: PCT > 0.5 ng/mL and <= 2 ng/mL: Systemic infection (sepsis) is possible, but other conditions are known to elevate PCT as well. (NOTE)       Sepsis PCT Algorithm           Lower Respiratory Tract                                      Infection PCT Algorithm    ----------------------------     ----------------------------         PCT < 0.25 ng/mL                PCT < 0.10 ng/mL         Strongly encourage             Strongly discourage   discontinuation of antibiotics    initiation of antibiotics    ----------------------------     -----------------------------       PCT 0.25 - 0.50 ng/mL            PCT 0.10 - 0.25 ng/mL               OR       >80% decrease in PCT            Discourage initiation of  antibiotics      Encourage discontinuation           of antibiotics    ----------------------------     -----------------------------         PCT >= 0.50 ng/mL              PCT 0.26 - 0.50 ng/mL                AND       <80%  decrease in PCT             Encourage initiation of                                             antibiotics       Encourage continuation           of antibiotics    ----------------------------     -----------------------------        PCT >= 0.50 ng/mL                  PCT > 0.50 ng/mL               AND         increase in PCT                  Strongly encourage                                      initiation of antibiotics    Strongly encourage escalation           of antibiotics                                     -----------------------------                                           PCT <= 0.25 ng/mL                                                 OR                                        > 80% decrease in PCT                                     Discontinue / Do not initiate                                             antibiotics Performed at [place] [address], [place], [place] [address]   Basic metabolic panel     Status: Abnormal   Collection Time:  12/06/17  2:02 AM  Result Value Ref Range   Sodium 146 (H) 135 - 145 mmol/L   Potassium 3.2 (L) 3.5 - 5.1 mmol/L   Chloride 116 (H) 101 - 111 mmol/L   CO2 19 (L) 22 - 32 mmol/L   Glucose, Bld 158 (H) 65 - 99 mg/dL   BUN 12 6 - 20 mg/dL   Creatinine, Ser 1.13 0.61 - 1.24 mg/dL   Calcium 8.8 (L) 8.9 - 10.3 mg/dL   GFR calc non Af Amer >60 >60 mL/min   GFR calc Af Amer >60 >60 mL/min    Comment: (NOTE) The eGFR has been calculated using the CKD EPI equation. This calculation has not been validated in all clinical situations. eGFR's persistently <60 mL/min signify possible Chronic Kidney Disease.    Anion gap 11 5 - 15    Comment: Performed at [facility], [place] [address] [place] [address], [place], [place] [address]  Magnesium     Status: None   Collection Time: 12/06/17  2:02 AM  Result Value Ref Range   Magnesium 2.3 1.7 - 2.4 mg/dL    Comment: Performed at [facility],  [place] [address] [place], [place] [address]  Phosphorus     Status: Abnormal   Collection Time: 12/06/17  2:02 AM  Result Value Ref Range   Phosphorus 1.8 (L) 2.5 - 4.6 mg/dL    Comment: Performed at [facility], [place] [address] [place], [place] [address]  CBC     Status: Abnormal   Collection Time: 12/06/17  2:02 AM  Result Value Ref Range   WBC 7.0 4.0 - 10.5 K/uL   RBC 4.21 (L) 4.22 - 5.81 MIL/uL   Hemoglobin 12.2 (L) 13.0 - 17.0 g/dL    Comment: REPEATED TO VERIFY DELTA CHECK NOTED RESULT CALLED TO, READ BACK BY AND VERIFIED WITH: JEFF,STRENK AT 0415 ON 12/06/17 BY A,MOHAMED    HCT 35.4 (L) 39.0 - 52.0 %   MCV 84.1 78.0 - 100.0 fL   MCH 29.0 26.0 - 34.0 pg   MCHC 34.5 30.0 - 36.0 g/dL   RDW 15.0 11.5 - 15.5 %   Platelets 201 150 - 400 K/uL    Comment: Performed at [facility], [place] [address], [place], [place] [address]  Lactic acid, plasma     Status: None   Collection Time: 12/06/17  2:02 AM  Result Value Ref Range   Lactic Acid, Venous 1.0 0.5 - 1.9 mmol/L    Comment: Performed at [facility], [place] [address], [place], [place] [address]  Glucose, capillary     Status: Abnormal   Collection Time: 12/06/17  2:07 AM  Result Value Ref Range   Glucose-Capillary 159 (H) 65 - 99 mg/dL   Comment 1 Notify RN    Comment 2 Document in Chart   Glucose, capillary     Status: Abnormal   Collection Time: 12/06/17  3:15 AM  Result Value Ref Range   Glucose-Capillary 139 (H) 65 - 99 mg/dL   Comment 1 Notify RN    Comment 2 Document in Chart   Glucose, capillary     Status: Abnormal   Collection Time: 12/06/17  4:27 AM  Result Value Ref Range   Glucose-Capillary 126 (H) 65 - 99 mg/dL   Comment 1 Notify RN    Comment 2 Document in Chart   Glucose, capillary     Status: Abnormal   Collection Time: 12/06/17  5:36 AM  Result Value Ref Range   Glucose-Capillary 175 (H) 65 - 99 mg/dL  Glucose, capillary     Status: Abnormal    Collection Time: 12/06/17  6:34 AM  Result Value Ref Range   Glucose-Capillary 194 (H) 65 - 99 mg/dL   Comment 1 Notify RN    Comment 2 Document in Chart   Basic metabolic panel     Status: Abnormal   Collection Time: 12/06/17  7:43 AM  Result Value Ref Range   Sodium 146 (H) 135 - 145 mmol/L   Potassium 2.7 (LL) 3.5 - 5.1 mmol/L    Comment: CRITICAL RESULT CALLED TO, READ BACK BY AND VERIFIED WITH: S.Rosine Abe [id number] '@0916'$  BY V.WILKINS DELTA CHECK NOTED    Chloride 115 (H) 101 - 111 mmol/L   CO2 21 (L) 22 - 32 mmol/L   Glucose, Bld 215 (H) 65 - 99 mg/dL   BUN 12 6 - 20 mg/dL   Creatinine, Ser 1.21 0.61 - 1.24 mg/dL   Calcium 9.0 8.9 - 10.3 mg/dL   GFR calc non Af Amer >60 >60 mL/min   GFR calc Af Amer >60 >60 mL/min    Comment: (NOTE) The eGFR has been calculated using the CKD EPI equation. This calculation has not been validated in all clinical situations. eGFR's persistently <60 mL/min signify possible Chronic Kidney Disease.    Anion gap 10 5 - 15    Comment: Performed at [facility], [place] [address] [place], [place] [address]  Glucose, capillary     Status: Abnormal   Collection Time: 12/06/17  7:43 AM  Result Value Ref Range   Glucose-Capillary 193 (H) 65 - 99 mg/dL   Comment 1 Notify RN    Comment 2 Document in Chart   Glucose, capillary     Status: Abnormal   Collection Time: 12/06/17  8:50 AM  Result Value Ref Range   Glucose-Capillary 153 (H) 65 - 99 mg/dL   Comment 1 Notify RN    Comment 2 Document in Chart   Glucose, capillary     Status: Abnormal   Collection Time: 12/06/17  9:53 AM  Result Value Ref Range   Glucose-Capillary 164 (H) 65 - 99 mg/dL   Comment 1 Notify RN    Comment 2 Document in Chart   Glucose, capillary     Status: Abnormal   Collection Time: 12/06/17 11:23 AM  Result Value Ref Range   Glucose-Capillary 269 (H) 65 - 99 mg/dL  Basic metabolic panel     Status: Abnormal   Collection Time: 12/06/17 11:44 AM  Result  Value Ref Range   Sodium 140 135 - 145 mmol/L   Potassium 3.0 (L) 3.5 - 5.1 mmol/L   Chloride 110 101 - 111 mmol/L   CO2 18 (L) 22 - 32 mmol/L   Glucose, Bld 314 (H) 65 - 99 mg/dL   BUN 10 6 - 20 mg/dL   Creatinine, Ser 1.10 0.61 - 1.24 mg/dL   Calcium 8.8 (L) 8.9 - 10.3 mg/dL   GFR calc non Af Amer >60 >60 mL/min   GFR calc Af Amer >60 >60 mL/min    Comment: (NOTE) The eGFR has been calculated using the CKD EPI equation. This calculation has not been validated in all clinical situations. eGFR's persistently <60 mL/min signify possible Chronic Kidney Disease.    Anion gap 12 5 - 15    Comment: Performed at [facility], [place] [address], [place], [place] [address]  Glucose, capillary  Status: Abnormal   Collection Time: 12/06/17  2:54 PM  Result Value Ref Range   Glucose-Capillary 279 (H) 65 - 99 mg/dL  Basic metabolic panel     Status: Abnormal   Collection Time: 12/06/17  3:59 PM  Result Value Ref Range   Sodium 138 135 - 145 mmol/L   Potassium 3.3 (L) 3.5 - 5.1 mmol/L   Chloride 108 101 - 111 mmol/L   CO2 19 (L) 22 - 32 mmol/L   Glucose, Bld 378 (H) 65 - 99 mg/dL   BUN 9 6 - 20 mg/dL   Creatinine, Ser 1.12 0.61 - 1.24 mg/dL   Calcium 8.9 8.9 - 10.3 mg/dL   GFR calc non Af Amer >60 >60 mL/min   GFR calc Af Amer >60 >60 mL/min    Comment: (NOTE) The eGFR has been calculated using the CKD EPI equation. This calculation has not been validated in all clinical situations. eGFR's persistently <60 mL/min signify possible Chronic Kidney Disease.    Anion gap 11 5 - 15    Comment: Performed at [facility], [place] [address] [place], [place] [address]  Glucose, capillary     Status: Abnormal   Collection Time: 12/06/17  4:16 PM  Result Value Ref Range   Glucose-Capillary 366 (H) 65 - 99 mg/dL  Glucose, capillary     Status: Abnormal   Collection Time: 12/06/17  6:15 PM  Result Value Ref Range   Glucose-Capillary 402 (H) 65 - 99 mg/dL    Comment 1 Notify RN   Basic metabolic panel     Status: Abnormal   Collection Time: 12/06/17  7:50 PM  Result Value Ref Range   Sodium 137 135 - 145 mmol/L   Potassium 3.3 (L) 3.5 - 5.1 mmol/L   Chloride 108 101 - 111 mmol/L   CO2 21 (L) 22 - 32 mmol/L   Glucose, Bld 448 (H) 65 - 99 mg/dL   BUN 9 6 - 20 mg/dL   Creatinine, Ser 1.08 0.61 - 1.24 mg/dL   Calcium 8.9 8.9 - 10.3 mg/dL   GFR calc non Af Amer >60 >60 mL/min   GFR calc Af Amer >60 >60 mL/min    Comment: (NOTE) The eGFR has been calculated using the CKD EPI equation. This calculation has not been validated in all clinical situations. eGFR's persistently <60 mL/min signify possible Chronic Kidney Disease.    Anion gap 8 5 - 15    Comment: Performed at [facility], [place] [address] [place], [place] [address]  Glucose, capillary     Status: Abnormal   Collection Time: 12/06/17  9:10 PM  Result Value Ref Range   Glucose-Capillary 312 (H) 65 - 99 mg/dL  Basic metabolic panel     Status: Abnormal   Collection Time: 12/07/17  1:00 AM  Result Value Ref Range   Sodium 138 135 - 145 mmol/L   Potassium 2.8 (L) 3.5 - 5.1 mmol/L   Chloride 109 101 - 111 mmol/L   CO2 22 22 - 32 mmol/L   Glucose, Bld 210 (H) 65 - 99 mg/dL   BUN 7 6 - 20 mg/dL   Creatinine, Ser 1.02 0.61 - 1.24 mg/dL   Calcium 8.8 (L) 8.9 - 10.3 mg/dL   GFR calc non Af Amer >60 >60 mL/min   GFR calc Af Amer >60 >60 mL/min    Comment: (NOTE) The eGFR has been calculated using the CKD EPI equation. This calculation has not been validated in  all clinical situations. eGFR's persistently <60 mL/min signify possible Chronic Kidney Disease.    Anion gap 7 5 - 15    Comment: Performed at [facility], [place] [address] [place], [place] [address]  Basic metabolic panel     Status: Abnormal   Collection Time: 12/07/17  4:16 AM  Result Value Ref Range   Sodium 141 135 - 145 mmol/L   Potassium 3.2 (L) 3.5 - 5.1 mmol/L   Chloride 110  101 - 111 mmol/L   CO2 21 (L) 22 - 32 mmol/L   Glucose, Bld 197 (H) 65 - 99 mg/dL   BUN 7 6 - 20 mg/dL   Creatinine, Ser 1.04 0.61 - 1.24 mg/dL   Calcium 9.0 8.9 - 10.3 mg/dL   GFR calc non Af Amer >60 >60 mL/min   GFR calc Af Amer >60 >60 mL/min    Comment: (NOTE) The eGFR has been calculated using the CKD EPI equation. This calculation has not been validated in all clinical situations. eGFR's persistently <60 mL/min signify possible Chronic Kidney Disease.    Anion gap 10 5 - 15    Comment: Performed at [facility], [place] [address], [place], [place] [address]  Procalcitonin     Status: None   Collection Time: 12/07/17  4:16 AM  Result Value Ref Range   Procalcitonin 0.53 ng/mL    Comment:        Interpretation: PCT > 0.5 ng/mL and <= 2 ng/mL: Systemic infection (sepsis) is possible, but other conditions are known to elevate PCT as well. (NOTE)       Sepsis PCT Algorithm           Lower Respiratory Tract                                      Infection PCT Algorithm    ----------------------------     ----------------------------         PCT < 0.25 ng/mL                PCT < 0.10 ng/mL         Strongly encourage             Strongly discourage   discontinuation of antibiotics    initiation of antibiotics    ----------------------------     -----------------------------       PCT 0.25 - 0.50 ng/mL            PCT 0.10 - 0.25 ng/mL               OR       >80% decrease in PCT            Discourage initiation of                                            antibiotics      Encourage discontinuation           of antibiotics    ----------------------------     -----------------------------         PCT >= 0.50 ng/mL              PCT 0.26 - 0.50 ng/mL  AND       <80% decrease in PCT             Encourage initiation of                                             antibiotics       Encourage continuation           of antibiotics     ----------------------------     -----------------------------        PCT >= 0.50 ng/mL                  PCT > 0.50 ng/mL               AND         increase in PCT                  Strongly encourage                                      initiation of antibiotics    Strongly encourage escalation           of antibiotics                                     -----------------------------                                           PCT <= 0.25 ng/mL                                                 OR                                        > 80% decrease in PCT                                     Discontinue / Do not initiate                                             antibiotics Performed at [place] [address], [place], [place] [address]   Magnesium     Status: None   Collection Time: 12/07/17  4:16 AM  Result Value Ref Range   Magnesium 2.2 1.7 - 2.4 mg/dL    Comment: Performed at [facility], [place] [address] [place], [place] [address]  CBC     Status: Abnormal   Collection Time: 12/07/17  4:16 AM  Result Value Ref Range   WBC 4.8 4.0 - 10.5 K/uL   RBC 4.42 4.22 - 5.81 MIL/uL   Hemoglobin 12.9 (L)  13.0 - 17.0 g/dL   HCT 37.2 (L) 39.0 - 52.0 %   MCV 84.2 78.0 - 100.0 fL   MCH 29.2 26.0 - 34.0 pg   MCHC 34.7 30.0 - 36.0 g/dL   RDW 15.3 11.5 - 15.5 %   Platelets 146 (L) 150 - 400 K/uL    Comment: Performed at [facility], [place] [address] [place], [place] [address]  Glucose, capillary     Status: Abnormal   Collection Time: 12/07/17  7:01 AM  Result Value Ref Range   Glucose-Capillary 199 (H) 65 - 99 mg/dL    Current Facility-Administered Medications  Medication Dose Route Frequency Provider Last Rate Last Dose  . busPIRone (BUSPAR) tablet 10 mg  10 mg Oral TID Collene Gobble, MD   10 mg at 12/07/17 0856  . famotidine (PEPCID) tablet 40 mg  40 mg Oral QHS Erick Colace, NP      . FLUoxetine (PROZAC) capsule 20  mg  20 mg Oral Daily Collene Gobble, MD   20 mg at 12/07/17 0856  . heparin injection 5,000 Units  5,000 Units Subcutaneous Q8H Omar Person, NP   5,000 Units at 12/07/17 0519  . insulin aspart (novoLOG) injection 0-20 Units  0-20 Units Subcutaneous TID WC Erick Colace, NP      . insulin aspart (novoLOG) injection 0-5 Units  0-5 Units Subcutaneous QHS Erick Colace, NP      . insulin aspart (novoLOG) injection 4 Units  4 Units Subcutaneous TID WC Erick Colace, NP      . insulin glargine (LANTUS) injection 20 Units  20 Units Subcutaneous Daily Collene Gobble, MD   20 Units at 12/06/17 [phone]  . insulin regular (NOVOLIN R,HUMULIN R) 100 Units in sodium chloride 0.9 % 100 mL (1 Units/mL) infusion   Intravenous Continuous Molpus, Zian, MD   Stopped at 12/06/17 1126  . lamoTRIgine (LAMICTAL) tablet 200 mg  200 mg Oral Daily Collene Gobble, MD   200 mg at 12/07/17 0857  . lithium carbonate (ESKALITH) CR tablet 900 mg  900 mg Oral Daily Collene Gobble, MD   900 mg at 12/06/17 0942  . pantoprazole (PROTONIX) EC tablet 40 mg  40 mg Oral BID Erick Colace, NP      . potassium chloride (K-DUR,KLOR-CON) CR tablet 30 mEq  30 mEq Oral Q4H Anders Simmonds, MD   30 mEq at 12/07/17 6812  . pramipexole (MIRAPEX) tablet 0.75 mg  0.75 mg Oral QHS Collene Gobble, MD   0.75 mg at 12/06/17 2109  . sodium chloride flush (NS) 0.9 % injection 10-40 mL  10-40 mL Intracatheter Q12H Collene Gobble, MD   10 mL at 12/06/17 2109  . sodium chloride flush (NS) 0.9 % injection 10-40 mL  10-40 mL Intracatheter PRN Byrum, Rose Fillers, MD        Musculoskeletal: Strength & Muscle Tone: within normal limits Gait & Station: UTA since patient is lying in bed. Patient leans: N/A  Psychiatric Specialty Exam: Physical Exam  Nursing note and vitals reviewed. Constitutional: He is oriented to person, place, and time. He appears well-developed and well-nourished.  HENT:  Head: Normocephalic and atraumatic.  Neck:  Normal range of motion.  Respiratory: Effort normal.  Musculoskeletal: Normal range of motion.  Neurological: He is alert and oriented to person, place, and time.  Skin: No rash noted.  Psychiatric: His speech is normal and behavior is normal. Judgment and thought  content normal. Cognition and memory are normal. He exhibits a depressed mood.    Review of Systems  Constitutional: Positive for chills. Negative for fever.  Cardiovascular: Negative for chest pain.  Gastrointestinal: Negative for abdominal pain, constipation, diarrhea, nausea and vomiting.  Psychiatric/Behavioral: Positive for depression and suicidal ideas. Negative for hallucinations and substance abuse. The patient does not have insomnia.   All other systems reviewed and are negative.   Blood pressure (!) 147/90, pulse 66, temperature 98.3 F (36.8 C), temperature source Oral, resp. rate 15, height '6\' 1"'$  (1.854 m), weight 112.2 kg (247 lb 5.7 oz), SpO2 99 %.Body mass index is 32.63 kg/m.  General Appearance: Fairly Groomed, middle aged, African American male, wearing a hospital gown, corrective lenses with an unshaved face who is lying in bed. NAD.   Eye Contact:  Good  Speech:  Clear and Coherent and Normal Rate  Volume:  Normal  Mood:  Depressed  Affect:  Constricted  Thought Process:  Goal Directed, Linear and Descriptions of Associations: Intact  Orientation:  Full (Time, Place, and Person)  Thought Content:  Logical  Suicidal Thoughts:  Yes.  with intent/plan  Homicidal Thoughts:  No  Memory:  Immediate;   Good Recent;   Good Remote;   Good  Judgement:  Fair  Insight:  Fair  Psychomotor Activity:  Normal  Concentration:  Concentration: Good and Attention Span: Good  Recall:  Good  Fund of Knowledge:  Good  Language:  Good  Akathisia:  No  Handed:  Right  AIMS (if indicated):   N/A  Assets:  Communication Skills Housing Social Support  ADL's:  Intact  Cognition:  WNL  Sleep:   Increased sleep time.    Assessment:  Marc Long is a 42 y.o. male who was admitted with DKA. He endorsed SI so psychiatry was consulted. He reports SI in the setting of depressive symptoms. He is unable to safety plan and therefore warrants inpatient psychiatric hospitalization for stabilization and treatment.   Treatment Plan Summary: -Patient warrants inpatient psychiatric hospitalization given high risk of harm to self. -Continue Engineer, materials.  -Continue home medications: Continue Buspar 10 mg TID for mood augmentation/anxiety, Lamictal 200 mg daily for mood stabilization and Lithium 900 mg daily for mood stabilization.  -Increase Prozac 20 mg to 40 mg daily for depression.  -Please pursue involuntary commitment if patient refuses voluntary psychiatric hospitalization or attempts to leave the hospital.  -Will sign off on patient at this time. Please consult psychiatry again as needed.     Disposition: Recommend psychiatric Inpatient admission when medically cleared.  Faythe Dingwall, DO 12/07/2017 10:29 AM

## 2017-12-07 NOTE — Progress Notes (Signed)
..  [facility] ADULT ICU REPLACEMENT PROTOCOL FOR AM LAB REPLACEMENT ONLY  The patient does apply for the [facility] Adult ICU Electrolyte Replacment Protocol based on the criteria listed below:   1. Is GFR >/= 40 ml/min? Yes.    Patient's GFR today is >60 2. Is urine output >/= 0.5 ml/kg/hr for the last 6 hours? Yes.   Patient's UOP is 1.04 ml/kg/hr 3. Is BUN < 60 mg/dL? Yes.    Patient's BUN today is 7 4. Abnormal electrolyte(s): K+ 3.2 5. Ordered repletion with: protocol6. If a panic level lab has been reported, has the CCM MD in charge been notified? No..   Physician:  Dr. Radene Knee, [place] Grumbling 12/07/2017 6:14 AM

## 2017-12-07 NOTE — Progress Notes (Signed)
   12/07/17 1100  Clinical Encounter Type  Visited With Patient not available  Visit Type Initial;Psychological support;Spiritual support;Critical Care  Referral From Nurse  Consult/Referral To Chaplain  Spiritual Encounters  Spiritual Needs Emotional;Literature;Other (Comment)  Stress Factors  Patient Stress Factors Not reviewed  Advance Directives (For Healthcare)  Does Patient Have a Medical Advance Directive? No  Would patient like information on creating a medical advance directive? Yes (Inpatient - patient requests chaplain consult to create a medical advance directive) (Paperwork Provided)   The patient was unavailable at the time of my visit. I dropped off the Advance Directive paperwork with the patient's nurses assistant. Will follow up at a later time.    Please, contact Spiritual Care for further assistance.   Chaplain Shanon Ace M.Div., [facility]

## 2017-12-07 NOTE — Progress Notes (Signed)
Inpatient Diabetes Program Recommendations  AACE/ADA: New Consensus Statement on Inpatient Glycemic Control (2015)  Target Ranges:  Prepandial:   less than 140 mg/dL      Peak postprandial:   less than 180 mg/dL (1-2 hours)      Critically ill patients:  140 - 180 mg/dL   Lab Results  Component Value Date   FOYDXA 128 (H) 12/07/2017   HGBA1C 6.5 03/05/2017    Review of Glycemic Control  Diabetes history: DM2 Outpatient Diabetes medications: None Current orders for Inpatient glycemic control: Lantus 20 units QD, Novolog 0-20 units tidwc and hs + 4 units tidwc  Pt stopped taking all his diabetes meds.   Inpatient Diabetes Program Recommendations:    Lantus 20 units QHS Novolog 8 units tidwc for meal coverage insulin  Spoke with pt briefly about why he quit taking his diabetes meds at home. States he was trying to kill himself.  Will need close monitoring of insulin administration. Psych to see.  Continue to follow.  Thank you. Lorenda Peck, RD, LDN, CDE Inpatient Diabetes Coordinator [phone]

## 2017-12-07 NOTE — Progress Notes (Addendum)
Initial Nutrition Assessment  DOCUMENTATION CODES:   Obesity unspecified  INTERVENTION:   - Glucerna Shake po TID, each supplement provides 220 kcal and 10 grams of protein  - Encourage PO intake  NUTRITION DIAGNOSIS:   Unintentional weight loss related to other (see comment), decreased appetite(medication changes) as evidenced by percent weight loss(23.6% weight loss in 8 months).  GOAL:   Patient will meet greater than or equal to 90% of their needs  MONITOR:   PO intake, Supplement acceptance, Skin, Weight trends, I & O's  REASON FOR ASSESSMENT:   Malnutrition Screening Tool    ASSESSMENT:   42 year old male who presented to the ED with SOB. Pt found to have DKA. PMH significant for depression, bipolar disorder, diabetes mellitus, and gallstones.  Spoke with RN during visit.  Spoke with pt and roommate at bedside. Pt reports typically having a good appetite up until 3 weeks ago when he began feeling sick. Pt reports eating 1-2 meals during the time period when he was sick (soup, Powerade) but typically eating 3 meals with snacks.  Pt endorses recent weight loss. Per pt and roommate, pt had his psychiatric medications changed 5 months ago. Prior that change, pt weighed 323 lbs. Pt's roommate states that "he went from a weight gain drug to a weight loss drug." Per weight history in chart, pt has lost 76 lbs in 8 months. This is a 23.6% weight loss which is severe and significant for timeframe.  Meal Completion: 75-100%  Medications reviewed and include: 40 mg Pepcid daily, slicing scale Novolog, 20 units Lantus daily, 40 mg Protonix BID, 30 mEq K-dur x 2 runs today  Labs reviewed: potassium 3.2 (L), CO2 21 (L), hemoglobin 12.9 (L), HCT 37.2 (L), platelets 146 (L) CBG's: 199, 312, 402, 366, 279, 269 x 24 hours  UOP: 3175 ml x 24 hours  NUTRITION - FOCUSED PHYSICAL EXAM:    Most Recent Value  Orbital Region  No depletion  Upper Arm Region  Mild depletion  Thoracic  and Lumbar Region  No depletion  Buccal Region  No depletion  Temple Region  No depletion  Clavicle Bone Region  Mild depletion  Clavicle and Acromion Bone Region  Mild depletion  Scapular Bone Region  Unable to assess  Dorsal Hand  No depletion  Patellar Region  No depletion  Anterior Thigh Region  No depletion  Posterior Calf Region  No depletion  Edema (RD Assessment)  None  Hair  Reviewed  Eyes  Reviewed  Mouth  Reviewed  Skin  Reviewed  Nails  Reviewed       Diet Order:   Diet Order           Diet heart healthy/carb modified Room service appropriate? Yes; Fluid consistency: Thin  Diet effective now          EDUCATION NEEDS:   No education needs have been identified at this time  Skin:  Skin Assessment: Reviewed RN Assessment  Last BM:  12/06/17  Height:   Ht Readings from Last 1 Encounters:  12/05/17 6\' 1"  (1.854 m)    Weight:   Wt Readings from Last 1 Encounters:  12/05/17 247 lb 5.7 oz (112.2 kg)    Ideal Body Weight:     BMI:  Body mass index is 32.63 kg/m.  Estimated Nutritional Needs:   Kcal:  2700-2900 kcal/day  Protein:  135-150 grams/day  Fluid:  >/= 2.7 L/day    Gaynell Face, MS, RD, LDN Pager: [phone] Weekend/After Hours: [phone]

## 2017-12-07 NOTE — Progress Notes (Signed)
SBAR REPORT GIVEN TO 5 EAST RN WITH ALLOWANCE FOR ALL QUESTIONS TO BE ASKED AND ANSWERED.

## 2017-12-07 NOTE — Progress Notes (Signed)
Patient received to the unit by this nurse. He is alert and oriented. Denies pain. Lung sounds Clear to Auscultation. Heart rate regular. Positive bowel sounds. No open wounds. Skin intact. IV Left Forearm. He is calm. Answers questions appropriately. Will monitor for changes!

## 2017-12-07 NOTE — Progress Notes (Signed)
PULMONARY / CRITICAL CARE MEDICINE   Name: Marc Long MRN: [id number] DOB: [birth year]    ADMISSION DATE:  12/05/2017 CONSULTATION DATE:  12/05/17  REFERRING MD:  Dr Florina Ou  CHIEF COMPLAINT:  Dyspnea  HISTORY OF PRESENT ILLNESS:   42 year old man, never smoker, with a history of bipolar disorder (on lithium), diabetes, obesity, gallstones, depression.  Managed at home with metformin, insulin.  Poor p.o. intake with some nausea and vomiting for about the last 2 weeks.  24 hours prior to admission has had progressive confusion and shortness of breath.  Brought to the emergency department early 6/22, found to have a profound metabolic acidosis with evidence for DKA and also an elevated lactate.  SUBJECTIVE:  Complains of heartburn  VITAL SIGNS: Blood Pressure (Abnormal) 147/90   Pulse 66   Temperature 98.3 F (36.8 C) (Oral)   Respiration 15   Height 6\' 1"  (1.854 m)   Weight 247 lb 5.7 oz (112.2 kg)   Oxygen Saturation 99%   Body Mass Index 32.63 kg/m     INTAKE / OUTPUT:  Intake/Output Summary (Last 24 hours) at 12/07/2017 0757 Last data filed at 12/07/2017 0600 Gross per 24 hour  Intake 2027.95 ml  Output 3175 ml  Net -1147.05 ml     PHYSICAL EXAMINATION: General: This a 42 year old male patient currently resting in bed he is in no acute distress HEENT normocephalic atraumatic no jugular venous distention mucous membranes are moist Pulmonary: Clear to auscultation without accessory use Cardiac: Regular rate and rhythm Abdomen: Soft nontender Extremities: Brisk cap refill no edema strong pulses Neuro: Awake oriented.  No focal motor deficits Psych.  Still somewhat evasive, interactive, LABS:  BMET Recent Labs  Lab 12/06/17 1950 12/07/17 0100 12/07/17 0416  NA 137 138 141  K 3.3* 2.8* 3.2*  CL 108 109 110  CO2 21* 22 21*  BUN 9 7 7   CREATININE 1.08 1.02 1.04  GLUCOSE 448* 210* 197*    Electrolytes Recent Labs  Lab 12/05/17 0632  12/06/17 0202   12/06/17 1950 12/07/17 0100 12/07/17 0416  CALCIUM 8.4*   < > 8.8*   < > 8.9 8.8* 9.0  MG 2.3  --  2.3  --   --   --  2.2  PHOS 5.2*  --  1.8*  --   --   --   --    < > = values in this interval not displayed.    CBC Recent Labs  Lab 12/05/17 0243 12/06/17 0202 12/07/17 0416  WBC 15.6* 7.0 4.8  HGB 16.1 12.2* 12.9*  HCT 48.1 35.4* 37.2*  PLT 342 201 146*    Coag's No results for input(s): APTT, INR in the last 168 hours.  Sepsis Markers Recent Labs  Lab 12/05/17 0632 12/05/17 0951 12/06/17 0202 12/07/17 0416  LATICACIDVEN 3.9* 3.8* 1.0  --   PROCALCITON 0.38  --  1.18 0.53    ABG Recent Labs  Lab 12/05/17 0545  PHART 7.085*  PCO2ART BELOW REPORTABLE RANGE  PO2ART 140*    Liver Enzymes Recent Labs  Lab 12/05/17 0243  AST 24  ALT 27  ALKPHOS 101  BILITOT 1.1  ALBUMIN 4.2    Cardiac Enzymes No results for input(s): TROPONINI, PROBNP in the last 168 hours.  Glucose Recent Labs  Lab 12/06/17 1123 12/06/17 1454 12/06/17 1616 12/06/17 1815 12/06/17 2110 12/07/17 0701  GLUCAP 269* 279* 366* 402* 312* 199*    Imaging No results found.   STUDIES:  Chest x-ray 6/22 >> no  infiltrates, no acute abnormality CT abdomen/pelvis 6/22 >>   CULTURES: Blood 6/22 >>  Urine 6/22 >>   ANTIBIOTICS:  SIGNIFICANT EVENTS:  LINES/TUBES:  DISCUSSION: 42 year old man with obesity, bipolar disease, diabetes.  He presents with DKA, associated tachypnea and respiratory distress, elevated lactate that I suspect is due to his work of breathing.  He admits to not taking his insulin reliably at home    ASSESSMENT / PLAN:  Resolved issues Diabetic ketoacidosis Leukocytosis And gap metabolic acidosis Tachypnea Acute respiratory failure Acute renal failure Nausea and vomiting  Acute issues/active issues:   Diabetes, with poor glycemic control -Remains hyperglycemic but no longer in DKA Plan Continue sliding scale insulin with before meals/at  bedtime coverage; changing to resistant scale and adding meal time coverage  Lantus 20 units daily We will ask diabetic coordinator to evaluate Holding metformin for now  Diabetic gastroparesis with poorly controlled reflux Plan Protonix twice daily, and at bedtime Pepcid Have encouraged lifestyle modification i.e. head of bed elevated for at least 45 minutes after p.o. Intake If needed, could add Reglan before meals and at bedtime, hesitate to do that with his antipsychotic medications given concern for QTC changes  Acute metabolic encephalopathy, improved/resolved Bipolar disorder Suicidal ideology -On 6/22 indicated thoughts of self-harm on nursing suicide assessment admission questionnaire; still endorses intermittent desire for self-harm 6/24 Plan Continue BuSpar, Prozac, Lamictal, and lithium Safety sitter at bedside with suicide precautions Psychiatric evaluation  Fluid and electrolyte balance: Hypokalemia Plan Replace and recheck   Hypertension Plan Monitor; will defer to PCP as outpatient   Anemia and thrombocytopenia. No evidence of bleeding Plan Trend CBC Transfuse per ICU protocol if hemoglobin less than 7     FAMILY  - Updates: family updated at bedside 6/23  DVT prophylaxis: Subcutaneous heparin SUP: H2 blockade Diet: Diabetic diet Activity: Mobilize, suicide precautions Disposition : MedSurg'

## 2017-12-08 DIAGNOSIS — K219 Gastro-esophageal reflux disease without esophagitis: Secondary | ICD-10-CM

## 2017-12-08 DIAGNOSIS — D696 Thrombocytopenia, unspecified: Secondary | ICD-10-CM

## 2017-12-08 DIAGNOSIS — E1110 Type 2 diabetes mellitus with ketoacidosis without coma: Secondary | ICD-10-CM

## 2017-12-08 DIAGNOSIS — D649 Anemia, unspecified: Secondary | ICD-10-CM

## 2017-12-08 DIAGNOSIS — K3184 Gastroparesis: Secondary | ICD-10-CM

## 2017-12-08 DIAGNOSIS — E1143 Type 2 diabetes mellitus with diabetic autonomic (poly)neuropathy: Secondary | ICD-10-CM

## 2017-12-08 LAB — CBC WITH DIFFERENTIAL/PLATELET
Basophils Absolute: 0 10*3/uL (ref 0.0–0.1)
Basophils Relative: 1 %
Eosinophils Absolute: 0.1 10*3/uL (ref 0.0–0.7)
Eosinophils Relative: 3 %
HEMATOCRIT: 34.4 % — AB (ref 39.0–52.0)
HEMOGLOBIN: 11.7 g/dL — AB (ref 13.0–17.0)
LYMPHS ABS: 1 10*3/uL (ref 0.7–4.0)
LYMPHS PCT: 22 %
MCH: 29.4 pg (ref 26.0–34.0)
MCHC: 34 g/dL (ref 30.0–36.0)
MCV: 86.4 fL (ref 78.0–100.0)
Monocytes Absolute: 0.7 10*3/uL (ref 0.1–1.0)
Monocytes Relative: 15 %
NEUTROS ABS: 2.9 10*3/uL (ref 1.7–7.7)
NEUTROS PCT: 61 %
Platelets: 158 10*3/uL (ref 150–400)
RBC: 3.98 MIL/uL — ABNORMAL LOW (ref 4.22–5.81)
RDW: 15.3 % (ref 11.5–15.5)
WBC: 4.8 10*3/uL (ref 4.0–10.5)

## 2017-12-08 LAB — BASIC METABOLIC PANEL
ANION GAP: 6 (ref 5–15)
BUN: 5 mg/dL — ABNORMAL LOW (ref 6–20)
CO2: 27 mmol/L (ref 22–32)
Calcium: 9 mg/dL (ref 8.9–10.3)
Chloride: 107 mmol/L (ref 98–111)
Creatinine, Ser: 1.14 mg/dL (ref 0.61–1.24)
GFR calc non Af Amer: >60 mL/min (ref >60)
GLUCOSE: 338 mg/dL — AB (ref 70–99)
POTASSIUM: 3.2 mmol/L — AB (ref 3.5–5.1)
Sodium: 140 mmol/L (ref 135–145)

## 2017-12-08 LAB — GLUCOSE, CAPILLARY
GLUCOSE-CAPILLARY: 220 mg/dL — AB (ref 70–99)
Glucose-Capillary: 310 mg/dL — ABNORMAL HIGH (ref 70–99)
Glucose-Capillary: 248 mg/dL — ABNORMAL HIGH (ref 70–99)
Glucose-Capillary: 286 mg/dL — ABNORMAL HIGH (ref 70–99)

## 2017-12-08 MED ORDER — INSULIN ASPART 100 UNIT/ML ~~LOC~~ SOLN
8.0000 [IU] | Freq: Three times a day (TID) | SUBCUTANEOUS | Status: DC
  Administered 2017-12-08 – 2017-12-10 (×7): 8 [IU] via SUBCUTANEOUS

## 2017-12-08 MED ORDER — INSULIN GLARGINE 100 UNIT/ML ~~LOC~~ SOLN
25.0000 [IU] | Freq: Every day | SUBCUTANEOUS | Status: DC
  Administered 2017-12-08 – 2017-12-10 (×3): 25 [IU] via SUBCUTANEOUS
  Filled 2017-12-08 (×3): qty 0.25

## 2017-12-08 MED ORDER — FLUOXETINE HCL 20 MG PO CAPS
40.0000 mg | ORAL_CAPSULE | Freq: Every day | ORAL | Status: DC
  Administered 2017-12-09 – 2017-12-10 (×2): 40 mg via ORAL
  Filled 2017-12-08 (×2): qty 2

## 2017-12-08 MED ORDER — POTASSIUM CHLORIDE CRYS ER 20 MEQ PO TBCR
40.0000 meq | EXTENDED_RELEASE_TABLET | ORAL | Status: AC
  Administered 2017-12-08 (×2): 40 meq via ORAL
  Filled 2017-12-08: qty 4
  Filled 2017-12-08 (×2): qty 2

## 2017-12-08 NOTE — Progress Notes (Signed)
PROGRESS NOTE    Marc Long  VOJ:[id number] DOB: 25-Feb-1976 DOA: 12/05/2017 PCP: [name], MD  Brief Narrative:  The patient is a 42 year old man, with a history of bipolar disorder (on lithium), diabetes, obesity, gallstones, depression.  Managed at home with metformin, insulin.  He had Poor p.o. intake with some nausea and vomiting for about the last 2 weeks.  About 24 hours prior to admission has had progressive confusion and shortness of breath.  Brought to the emergency department early 6/22, found to have a profound metabolic acidosis with evidence for DKA and also an elevated lactate. Admitted under PCCM service and stabilized and transferred to [facility] service.  Insulin is continued to be adjusted for patient's hyperglycemia.  Assessment & Plan:   Principal Problem:   Depression with suicidal ideation Active Problems:   DKA (diabetic ketoacidoses) ([place])  Diabetic ketoacidosis in the setting of Diabetes mellitus with poor glycemic control -Ketoacidosis is improved however patient still remains hyperglycemic but no longer in DKA; Anion Gap was 6 -Continue to hold metformin for now -Increased Lantus to 25 units subcu daily -Continue with resistant NovoLog sliding scale AC and at bedtime and also increased pre-meal Novolog insulin from 4 units to 8 units 3 times daily with meals -Diabetes education coordinator following at this time -CBGs ranging from 248-310; blood sugar on this morning's BMP was 338 -Check HbA1c as last hemoglobin A1c was 6.5 and September 2018  Depression with Suicidal Ideation/Bipolar Disorder -Psychiatry consulted and appreciated evaluation recommendations -Per psychiatry patient warrants inpatient psychiatric hospitalization given his high risk of harm to himself -Continue one-to-one bed tender -Continue BuSpar 10 mg 3 times daily for mood augmentation/anxiety, Lamictal 200 mg p.o. daily for mood stabilization, and Lithium 900 mg daily for mood stabilization  per recommendations of Dr. Mariea Clonts -Increase Prozac 20 mg to 4 mg daily for depression -C/w Pramipexole 7.5 mg qHS -Social worker consulted for assistance with placement in inpatient psychiatric unit. -Per psychiatrist if patient refuses voluntary psychiatric hospitalization or attempt to leave the hospital will pursue involuntary commitment  Hypokalemia -Patient's potassium level this morning was 3.2 -Replete with p.o. potassium chloride 40 mg every 4 hours x 2 doses -Continue to monitor and replete as necessary -Repeat CMP in a.m.  GERD/Diabetic Gastroparesis -Continue with Pantoprazole 40 mg p.o. twice daily and Famotidine 40 mg nightly -Continue recommendations made by PCCM with lifestyle modifications including elevating head of bed for at least 45 minutes after p.o. intake, -We will consider adding metoclopramide before meals and bedtime but will currently hold off given concern for prolonged QTc with antipsychotic medications  Essential Hypertension -BP was elevated yesterday but now stable at 122/77 -Continue to Monitor BP's and add IV Hydralazine as necessary   Normocytic Anemia  -Patient's hemoglobin/hematocrit was 12.9/37.2 yesterday; Repeating CBC today and pending  -Continue to monitor for signs and symptoms of bleeding -Repeat CBC in the a.m.  Thrombocytopenia -Patient's Platelet Count was 146 yesterday; Repeating CBC today and pending  -Suspect Likely Reactive; If continuing to drop will stop Heparin 5,000 units sq q8h -Continue to Monitor for S/Sx of Bleeding -Repeat CBC in AM   AKI -Improved. Patient's BUN/Cr on admission was 14/1.56 in the setting of Dehydration -BUN/Cr today was 5/1.14 -Avoid Nephrotoxic Medications if possible -Repeat CMP in AM   DVT prophylaxis: Heparin 5,000 sq q8h Code Status: FULL CODE Family Communication: No family present at bedside Disposition Plan: D/C to Inpatient Psychiatric Unit   Consultants:   PCCM  Transfer  Procedures: None  Antimicrobials:  Anti-infectives (From admission, onward)   None     Subjective: Seen and examined at bedside and had no complaints and understands that he will likely need to go to inpatient psychiatric facility.  States nausea and vomiting is improved.  Feels good and pleasant to speak with today.  Objective: Vitals:   12/07/17 1400 12/07/17 1547 12/07/17 2020 12/08/17 0600  BP: 125/70 121/80 129/86 122/77  Pulse:  73 71 80  Resp: (!) 23 20 18 16   Temp:  98.3 F (36.8 C) 97.6 F (36.4 C) 98.4 F (36.9 C)  TempSrc:  Oral Oral Oral  SpO2: 99% 95% 99% 99%  Weight:      Height:        Intake/Output Summary (Last 24 hours) at 12/08/2017 1225 Last data filed at 12/08/2017 1030 Gross per 24 hour  Intake 1310 ml  Output 300 ml  Net 1010 ml   Filed Weights   12/05/17 0313 12/05/17 0747  Weight: (!) 146.5 kg (323 lb) 112.2 kg (247 lb 5.7 oz)   Examination: Physical Exam:  Constitutional: WN/WD obese AAM in NAD and appears calm and comfortable Eyes: Lids and conjunctivae normal, sclerae anicteric  ENMT: External Ears, Nose appear normal. Grossly normal hearing. Mucous membranes are moist. Neck: Appears normal, supple, no cervical masses, normal ROM, no appreciable thyromegaly, no JVD Respiratory: Diminished to auscultation bilaterally, no wheezing, rales, rhonchi or crackles. Normal respiratory effort and patient is not tachypenic. No accessory muscle use.  Cardiovascular: RRR, no murmurs / rubs / gallops. S1 and S2 auscultated. No extremity edema.  Abdomen: Soft, non-tender, Distended slightly 2/2 body habitus. No masses palpated. No appreciable hepatosplenomegaly. Bowel sounds positive x4.  GU: Deferred. Musculoskeletal: No clubbing / cyanosis of digits/nails. No joint deformity upper and lower extremities.  Skin: No rashes, lesions, ulcers on a limited skin eval. No induration; Warm and dry.  Neurologic: CN 2-12 grossly intact with no focal  deficits. Romberg sign and cerebellar reflexes not assessed.  Psychiatric: Normal judgment and insight. Alert and awake. Normal appearing mood and appropriate affect.   Data Reviewed: I have personally reviewed following labs and imaging studies  CBC: Recent Labs  Lab 12/05/17 0243 12/06/17 0202 12/07/17 0416  WBC 15.6* 7.0 4.8  NEUTROABS 13.0*  --   --   HGB 16.1 12.2* 12.9*  HCT 48.1 35.4* 37.2*  MCV 89.4 84.1 84.2  PLT 342 201 951*   Basic Metabolic Panel: Recent Labs  Lab 12/05/17 0632  12/06/17 0202  12/06/17 1559 12/06/17 1950 12/07/17 0100 12/07/17 0416 12/08/17 0609  NA 142   < > 146*   < > 138 137 138 141 140  K 4.3   < > 3.2*   < > 3.3* 3.3* 2.8* 3.2* 3.2*  CL 110   < > 116*   < > 108 108 109 110 107  CO2 7*   < > 19*   < > 19* 21* 22 21* 27  GLUCOSE 358*   < > 158*   < > 378* 448* 210* 197* 338*  BUN 14   < > 12   < > 9 9 7 7  5*  CREATININE 1.56*   < > 1.13   < > 1.12 1.08 1.02 1.04 1.14  CALCIUM 8.4*   < > 8.8*   < > 8.9 8.9 8.8* 9.0 9.0  MG 2.3  --  2.3  --   --   --   --  2.2  --  PHOS 5.2*  --  1.8*  --   --   --   --   --   --    < > = values in this interval not displayed.   GFR: Estimated Creatinine Clearance: 113.1 mL/min (by C-G formula based on SCr of 1.14 mg/dL). Liver Function Tests: Recent Labs  Lab 12/05/17 0243  AST 24  ALT 27  ALKPHOS 101  BILITOT 1.1  PROT 8.1  ALBUMIN 4.2   No results for input(s): LIPASE, AMYLASE in the last 168 hours. No results for input(s): AMMONIA in the last 168 hours. Coagulation Profile: No results for input(s): INR, PROTIME in the last 168 hours. Cardiac Enzymes: No results for input(s): CKTOTAL, CKMB, CKMBINDEX, TROPONINI in the last 168 hours. BNP (last 3 results) No results for input(s): PROBNP in the last 8760 hours. HbA1C: No results for input(s): HGBA1C in the last 72 hours. CBG: Recent Labs  Lab 12/07/17 1142 12/07/17 1704 12/07/17 2023 12/08/17 0726 12/08/17 1155  GLUCAP 327* 254*  259* 310* 248*   Lipid Profile: No results for input(s): CHOL, HDL, LDLCALC, TRIG, CHOLHDL, LDLDIRECT in the last 72 hours. Thyroid Function Tests: No results for input(s): TSH, T4TOTAL, FREET4, T3FREE, THYROIDAB in the last 72 hours. Anemia Panel: No results for input(s): VITAMINB12, FOLATE, FERRITIN, TIBC, IRON, RETICCTPCT in the last 72 hours. Sepsis Labs: Recent Labs  Lab 12/05/17 0253 12/05/17 7939 12/05/17 0951 12/06/17 0202 12/07/17 0416  PROCALCITON  --  0.38  --  1.18 0.53  LATICACIDVEN 4.69* 3.9* 3.8* 1.0  --     Recent Results (from the past 240 hour(s))  Urine culture     Status: Abnormal   Collection Time: 12/05/17  5:12 AM  Result Value Ref Range Status   Specimen Description   Final    URINE, RANDOM Performed at [facility] [address] [place], [place] [address]    Special Requests   Final    NONE Performed at [facility], [place] [address] [place], [place] [address]    Culture (A)  Final    <10,000 COLONIES/mL INSIGNIFICANT GROWTH Performed at [facility] [address], [place], [place] [address]    Report Status 12/06/2017 FINAL  Final  Culture, blood (routine x 2)     Status: None (Preliminary result)   Collection Time: 12/05/17  6:58 AM  Result Value Ref Range Status   Specimen Description   Final    BLOOD RIGHT HAND Performed at [facility] [address], [place], [place] [address]    Special Requests   Final    BOTTLES DRAWN AEROBIC ONLY Blood Culture adequate volume Performed at [facility] [address], [place], [place] [address]    Culture   Final    NO GROWTH 3 DAYS Performed at [facility], [place] [address], [place], [place] [address]    Report Status PENDING  Incomplete  Culture, blood (routine x 2)     Status: None (Preliminary result)   Collection Time: 12/05/17  7:46 AM  Result Value Ref Range Status   Specimen Description   Final     BLOOD LEFT HAND Performed at [facility] [address] [place], [place] [address]    Special Requests   Final    BOTTLES DRAWN AEROBIC ONLY Blood Culture adequate volume Performed at [facility] [address], [place],  [address]    Culture  Final    NO GROWTH 3 DAYS Performed at [facility], [place] [address], [place], [place] [address]    Report Status PENDING  Incomplete  MRSA PCR Screening     Status: None   Collection Time: 12/05/17  7:53 AM  Result Value Ref Range Status   MRSA by PCR NEGATIVE NEGATIVE Final    Comment:        The GeneXpert MRSA Assay (FDA approved for NASAL specimens only), is one component of a comprehensive MRSA colonization surveillance program. It is not intended to diagnose MRSA infection nor to guide or monitor treatment for MRSA infections. Performed at [facility], [place] [address], [place], [place] [address]     Radiology Studies: No results found.  Scheduled Meds: . busPIRone  10 mg Oral TID  . famotidine  40 mg Oral QHS  . feeding supplement (GLUCERNA SHAKE)  237 mL Oral TID WC  . [START ON 12/09/2017] FLUoxetine  40 mg Oral Daily  . heparin  5,000 Units Subcutaneous Q8H  . insulin aspart  0-20 Units Subcutaneous TID WC  . insulin aspart  0-5 Units Subcutaneous QHS  . insulin aspart  8 Units Subcutaneous TID WC  . insulin glargine  25 Units Subcutaneous Daily  . lamoTRIgine  200 mg Oral Daily  . lithium carbonate  900 mg Oral Daily  . pantoprazole  40 mg Oral BID  . potassium chloride  40 mEq Oral Q4H  . pramipexole  0.75 mg Oral QHS  . sodium chloride flush  10-40 mL Intracatheter Q12H   Continuous Infusions:   LOS: 3 days   Kerney Elbe, DO [facility] Pager [phone]  If 7PM-7AM, please contact night-coverage [email or web address] Password [facility] 12/08/2017, 12:25 PM

## 2017-12-08 NOTE — Progress Notes (Signed)
LCSW consulted for assistance with advanced directives.   LCSW paged chaplin and notified of patients request.   Psych is recommending inpatient psych for patient when medically stable. LCSW will follow for inpatient psych plaecment.   Carolin Coy [facility] [facility] [place]

## 2017-12-09 DIAGNOSIS — F329 Major depressive disorder, single episode, unspecified: Secondary | ICD-10-CM

## 2017-12-09 DIAGNOSIS — E119 Type 2 diabetes mellitus without complications: Secondary | ICD-10-CM

## 2017-12-09 DIAGNOSIS — R45851 Suicidal ideations: Secondary | ICD-10-CM

## 2017-12-09 DIAGNOSIS — E669 Obesity, unspecified: Secondary | ICD-10-CM

## 2017-12-09 DIAGNOSIS — F314 Bipolar disorder, current episode depressed, severe, without psychotic features: Secondary | ICD-10-CM

## 2017-12-09 DIAGNOSIS — N179 Acute kidney failure, unspecified: Secondary | ICD-10-CM

## 2017-12-09 LAB — HEMOGLOBIN A1C
Hgb A1c MFr Bld: 16.7 % — ABNORMAL HIGH (ref 4.8–5.6)
Mean Plasma Glucose: 432.59 mg/dL

## 2017-12-09 LAB — PHOSPHORUS: Phosphorus: 2 mg/dL — ABNORMAL LOW (ref 2.5–4.6)

## 2017-12-09 LAB — COMPREHENSIVE METABOLIC PANEL
ALBUMIN: 2.7 g/dL — AB (ref 3.5–5.0)
ALK PHOS: 57 U/L (ref 38–126)
ALT: 24 U/L (ref 0–44)
ANION GAP: 6 (ref 5–15)
AST: 27 U/L (ref 15–41)
BILIRUBIN TOTAL: 0.5 mg/dL (ref 0.3–1.2)
CALCIUM: 9.2 mg/dL (ref 8.9–10.3)
CO2: 30 mmol/L (ref 22–32)
Chloride: 106 mmol/L (ref 98–111)
Creatinine, Ser: 1.07 mg/dL (ref 0.61–1.24)
GFR calc Af Amer: >60 mL/min (ref >60)
GFR calc non Af Amer: >60 mL/min (ref >60)
GLUCOSE: 192 mg/dL — AB (ref 70–99)
Potassium: 3.6 mmol/L (ref 3.5–5.1)
Sodium: 142 mmol/L (ref 135–145)
TOTAL PROTEIN: 5.2 g/dL — AB (ref 6.5–8.1)

## 2017-12-09 LAB — CBC WITH DIFFERENTIAL/PLATELET
BASOS PCT: 1 %
Basophils Absolute: 0 10*3/uL (ref 0.0–0.1)
Eosinophils Absolute: 0.2 10*3/uL (ref 0.0–0.7)
Eosinophils Relative: 6 %
HEMATOCRIT: 33.5 % — AB (ref 39.0–52.0)
Hemoglobin: 11.2 g/dL — ABNORMAL LOW (ref 13.0–17.0)
LYMPHS ABS: 1 10*3/uL (ref 0.7–4.0)
Lymphocytes Relative: 24 %
MCH: 29.2 pg (ref 26.0–34.0)
MCHC: 33.4 g/dL (ref 30.0–36.0)
MCV: 87.5 fL (ref 78.0–100.0)
MONO ABS: 0.6 10*3/uL (ref 0.1–1.0)
MONOS PCT: 14 %
Neutro Abs: 2.4 10*3/uL (ref 1.7–7.7)
Neutrophils Relative %: 55 %
Platelets: 144 10*3/uL — ABNORMAL LOW (ref 150–400)
RBC: 3.83 MIL/uL — ABNORMAL LOW (ref 4.22–5.81)
RDW: 15.4 % (ref 11.5–15.5)
WBC: 4.2 10*3/uL (ref 4.0–10.5)

## 2017-12-09 LAB — RETICULOCYTES
RBC.: 3.83 MIL/uL — ABNORMAL LOW (ref 4.22–5.81)
Retic Count, Absolute: 65.1 10*3/uL (ref 19.0–186.0)
Retic Ct Pct: 1.7 % (ref 0.4–3.1)

## 2017-12-09 LAB — FOLATE: Folate: 10.2 ng/mL (ref >5.9)

## 2017-12-09 LAB — GLUCOSE, CAPILLARY
GLUCOSE-CAPILLARY: 278 mg/dL — AB (ref 70–99)
Glucose-Capillary: 182 mg/dL — ABNORMAL HIGH (ref 70–99)
Glucose-Capillary: 262 mg/dL — ABNORMAL HIGH (ref 70–99)

## 2017-12-09 LAB — VITAMIN B12: Vitamin B-12: 549 pg/mL (ref 180–914)

## 2017-12-09 LAB — MAGNESIUM: Magnesium: 2.1 mg/dL (ref 1.7–2.4)

## 2017-12-09 LAB — IRON AND TIBC
IRON: 80 ug/dL (ref 45–182)
Saturation Ratios: 40 % — ABNORMAL HIGH (ref 17.9–39.5)
TIBC: 200 ug/dL — AB (ref 250–450)
UIBC: 120 ug/dL

## 2017-12-09 LAB — FERRITIN: FERRITIN: 336 ng/mL (ref 24–336)

## 2017-12-09 MED ORDER — POLYETHYLENE GLYCOL 3350 17 G PO PACK: 17.0000 g | PACK | Freq: Every day | ORAL | 0 refills | Status: DC

## 2017-12-09 MED ORDER — INSULIN GLARGINE 100 UNIT/ML ~~LOC~~ SOLN: 25.0000 [IU] | Freq: Every day | SUBCUTANEOUS | 11 refills | Status: DC

## 2017-12-09 MED ORDER — FAMOTIDINE 40 MG PO TABS: 40.0000 mg | ORAL_TABLET | Freq: Every day | ORAL | Status: DC

## 2017-12-09 MED ORDER — POLYETHYLENE GLYCOL 3350 17 G PO PACK
17.0000 g | PACK | Freq: Every day | ORAL | Status: DC
  Administered 2017-12-09 – 2017-12-10 (×2): 17 g via ORAL
  Filled 2017-12-09 (×2): qty 1

## 2017-12-09 MED ORDER — FLUOXETINE HCL 40 MG PO CAPS: 40.0000 mg | ORAL_CAPSULE | Freq: Every day | ORAL | 3 refills | Status: DC

## 2017-12-09 MED ORDER — GLUCERNA SHAKE PO LIQD: 237.0000 mL | Freq: Three times a day (TID) | ORAL | 0 refills | Status: DC

## 2017-12-09 MED ORDER — K PHOS MONO-SOD PHOS DI & MONO 155-852-130 MG PO TABS
500.0000 mg | ORAL_TABLET | Freq: Two times a day (BID) | ORAL | Status: DC
  Administered 2017-12-09 – 2017-12-10 (×2): 500 mg via ORAL
  Filled 2017-12-09 (×2): qty 2

## 2017-12-09 MED ORDER — PANTOPRAZOLE SODIUM 40 MG PO TBEC: 40.0000 mg | DELAYED_RELEASE_TABLET | Freq: Two times a day (BID) | ORAL | Status: DC

## 2017-12-09 MED ORDER — INSULIN ASPART 100 UNIT/ML ~~LOC~~ SOLN: 8.0000 [IU] | Freq: Three times a day (TID) | SUBCUTANEOUS | 11 refills | Status: DC

## 2017-12-09 NOTE — Clinical Social Work Note (Signed)
Clinical Social Work Assessment  Patient Details  Name: Marc Long MRN: [id number] Date of Birth: 1976/02/14  Date of referral:  12/09/17               Reason for consult:  Facility Placement                Permission sought to share information with:  Facility Art therapist granted to share information::  No  Name::        Agency::  inpatietn psych facilities  Relationship::     Contact Information:     Housing/Transportation Living arrangements for the past 2 months:  Apartment Source of Information:  Patient Patient Interpreter Needed:  None Criminal Activity/Legal Involvement Pertinent to Current Situation/Hospitalization:  No - Comment as needed Significant Relationships:  Friend, Parents, Siblings Lives with:  Roommate Do you feel safe going back to the place where you live?  Yes Need for family participation in patient care:  No (Coment)  Care giving concerns:  No care giving concerns at the time of assessment.   Social Worker assessment / plan:  LCSW consulted for inpatient psych placement.   Patient admitted for DKA. Patient endorsed SI and psych was consulted. Patient has hx of bipolar disorder.   LCSW met at bedside with patient. Patient has a Actuary.   Patient reports that he understands psych recommendation for inpatient psych facility. Patient states that he has never been to an inpatient facility. Patient states that [facility]. Patient reports that he has not been seen by [facility] in 2 months.   Patient states he is currently unemployed and was previously employed by [employer]. Patient reports that he lives with a roommate in an apartment for about a year. Patient list his mother, brother and roommate has supports.   Patient is voluntary at the time of assessment.   PLAN: inpatient psych when medically stable.   Employment status:  Unemployed Forensic scientist:  Self  Pay (Medicaid Pending) PT Recommendations:  Not assessed at this time Information / Referral to community resources:  Inpatient Psychiatric Care (Comment Required)  Patient/Family's Response to care:  Patient pleasant and thankful for LCSW coordination with dc planning.   Patient/Family's Understanding of and Emotional Response to Diagnosis, Current Treatment, and Prognosis:  Patient has a clear understanding of his diagnosis and recommendation for treatment. Patient states that he is agreeable to inpatient psych when medically stable. Patient reports that he has no questions for LCSW at the time of assessment.   Emotional Assessment Appearance:  Appears stated age Attitude/Demeanor/Rapport:    Affect (typically observed):  Accepting, Calm Orientation:  Oriented to Self, Oriented to Place, Oriented to  Time, Oriented to Situation Alcohol / Substance use:  Not Applicable Psych involvement (Current and /or in the community):  No (Comment)  Discharge Needs  Concerns to be addressed:  Employment/School Concerns, Financial / Insurance Concerns Readmission within the last 30 days:  No Current discharge risk:  None Barriers to Discharge:  Continued Medical Work up   [employer], LCSW 12/09/2017, 9:52 AM

## 2017-12-09 NOTE — Discharge Summary (Signed)
Physician Discharge Summary  Marc Long DJS:[id number] DOB: [birth year] DOA: 12/05/2017  PCP: [name], MD  Admit date: 12/05/2017 Discharge date: 12/09/2017  Admitted From: home Disposition:  Inpatient psych facility   Recommendations for Outpatient Follow-up:  1. Check A1c in 3 - 4 wks   Discharge Condition:  stable   CODE STATUS:  Full code   Diet recommendation:  Carb mod/ heart healthy Consultations:  none    Discharge Diagnoses:  Principal Problem:   Depression with suicidal ideation Active Problems:   Type 2 diabetes mellitus without complication, without long-term current use of insulin (HCC)   Dehydration   Bipolar disorder (manic depression) (HCC)   AKI (acute kidney injury) ([place])   DKA (diabetic ketoacidoses) (HCC)   Obesity (BMI 30-39.9)     Brief Summary: The patient is a 42 year old man, with a history of bipolar disorder (on lithium), diabetes, obesity, gallstones, depression. Managed at home with metformin, insulin. He had Poor p.o. intake with some nausea and vomiting for about the last 2 weeks. About 24 hours prior to admission has had progressive confusion and shortness of breath. Brought to the emergency department early 6/22, found to have a profound metabolic acidosis with evidence for DKA and also an elevated lactate.   Hospital Course:  Diabetic ketoacidosis in the setting of Diabetes mellitus with poor glycemic control - DKA treated and transitioned from IV insulin infusion to s/c Lantus and Novolog - he states he was taking his medications appropriately until he stopped purposely due to depression and suicidal ideation - A1c is 16.7 - diabetic coordinator consulted to discussion management of diabetics  Hypokalemia and hypophosphatemia - replaced  AKI - due to dehydration- has improved with IVF  Depression with Suicidal Ideation/Bipolar Disorder -Psychiatry consulted and appreciated evaluation recommendations -Per psychiatry patient  warrants inpatient psychiatric hospitalization given his high risk of harm to himself -Continue one-to-one bed side sitter -Continue BuSpar 10 mg 3 times daily for mood augmentation/anxiety, Lamictal 200 mg p.o. daily for mood stabilization, and Lithium 900 mg daily for mood stabilization per recommendations of Dr. Mariea Clonts -Increased Prozac 20 mg to 40 mg daily for depression  RLS -C/w Pramipexole 7.5 mg qHS  GERD - cont Protonix- he is eating well now without GI symptoms  Constipation - Miralax  Obesity Body mass index is 32.63 kg/m.   Discharge Exam: Vitals:   12/09/17 0533 12/09/17 1450  BP: 111/72 117/68  Pulse: 70 76  Resp: 20 18  Temp: 98.4 F (36.9 C) 98.5 F (36.9 C)  SpO2: 96% 96%   Vitals:   12/08/17 2016 12/09/17 0019 12/09/17 0533 12/09/17 1450  BP: 104/67 99/67 111/72 117/68  Pulse: 79 71 70 76  Resp: _0 Temp: 98.7 F (37.1 C)  98.4 F (36.9 C) 98.5 F (36.9 C)  TempSrc: Oral  Oral Oral  SpO2: 97% 98% 96% 96%  Weight:      Height:        General: Pt is alert, awake, not in acute distress Cardiovascular: RRR, S1/S2 +, no rubs, no gallops Respiratory: CTA bilaterally, no wheezing, no rhonchi Abdominal: Soft, NT, ND, bowel sounds + Extremities: no edema, no cyanosis   Discharge Instructions  Discharge Instructions    Diet - low sodium heart healthy   Complete by:  As directed    Diet Carb Modified   Complete by:  As directed    Increase activity slowly   Complete by:  As directed  Allergies as of 12/09/2017      Reactions   Bee Venom Swelling   Keflex [cephalexin]    UPSET STOMACH   Relafen [nabumetone]    Blood in stool   Shellfish Allergy Nausea Only      Medication List    STOP taking these medications   ACCU-CHEK GUIDE test strip Generic drug:  glucose blood   blood glucose meter kit and supplies   Insulin Glargine 100 UNIT/ML Solostar Pen Commonly known as:  LANTUS SOLOSTAR Replaced by:  insulin glargine 100  UNIT/ML injection   insulin lispro 100 UNIT/ML KiwkPen Commonly known as:  HUMALOG KWIKPEN   LORazepam 0.5 MG tablet Commonly known as:  ATIVAN   methocarbamol 500 MG tablet Commonly known as:  ROBAXIN   Pen Needles 3/16" 31G X 5 MM Misc     TAKE these medications   busPIRone 10 MG tablet Commonly known as:  BUSPAR Take 10 mg by mouth 3 (three) times daily.   dapagliflozin propanediol 10 MG Tabs tablet Commonly known as:  FARXIGA Take 10 mg by mouth daily.   Exenatide ER 2 MG/0.85ML Auij Commonly known as:  BYDUREON BCISE Inject 2 mg into the skin once a week.   famotidine 40 MG tablet Commonly known as:  PEPCID Take 1 tablet (40 mg total) by mouth at bedtime.   feeding supplement (GLUCERNA SHAKE) Liqd Take 237 mLs by mouth 3 (three) times daily with meals.   FLUoxetine 40 MG capsule Commonly known as:  PROZAC Take 1 capsule (40 mg total) by mouth daily. Start taking on:  12/10/2017 What changed:    medication strength  how much to take   insulin aspart 100 UNIT/ML injection Commonly known as:  novoLOG Inject 8 Units into the skin 3 (three) times daily with meals.   insulin glargine 100 UNIT/ML injection Commonly known as:  LANTUS Inject 0.25 mLs (25 Units total) into the skin daily. Start taking on:  12/10/2017 Replaces:  Insulin Glargine 100 UNIT/ML Solostar Pen   lamoTRIgine 200 MG tablet Commonly known as:  LAMICTAL TAKE 200 MG BY MOUTH ONCE DAILY   lithium carbonate 300 MG CR tablet Commonly known as:  LITHOBID Take 3 (900 MG) tablets by mouth daily   metFORMIN 1000 MG tablet Commonly known as:  GLUCOPHAGE Take 1 tablet (1,000 mg total) by mouth 2 (two) times daily with a meal.   pantoprazole 40 MG tablet Commonly known as:  PROTONIX Take 1 tablet (40 mg total) by mouth 2 (two) times daily.   polyethylene glycol packet Commonly known as:  MIRALAX / GLYCOLAX Take 17 g by mouth daily.   pramipexole 0.25 MG tablet Commonly known as:   MIRAPEX Take 0.75 mg by mouth at bedtime.       Allergies  Allergen Reactions  . Bee Venom Swelling  . Keflex [Cephalexin]     UPSET STOMACH  . Relafen [Nabumetone]     Blood in stool  . Shellfish Allergy Nausea Only     Procedures/Studies:    Dg Chest Port 1 View  Result Date: 12/06/2017 CLINICAL DATA:  Tachypnea. EXAM: PORTABLE CHEST 1 VIEW COMPARISON:  12/05/2017 FINDINGS: The heart size and mediastinal contours are within normal limits. Both lungs are clear. No pleural effusion or pneumothorax. The visualized skeletal structures are unremarkable. IMPRESSION: No active disease. Electronically Signed   By: Lajean Manes M.D.   On: 12/06/2017 07:29   Dg Chest Port 1 View  Result Date: 12/05/2017 CLINICAL DATA:  Initial evaluation  for acute shortness of breath. EXAM: PORTABLE CHEST 1 VIEW COMPARISON:  None. FINDINGS: The cardiac and mediastinal silhouettes are within normal limits. The lungs are hypoinflated. No airspace consolidation, pleural effusion, or pulmonary edema is identified. There is no pneumothorax. No acute osseous abnormality identified. IMPRESSION: No active disease. Electronically Signed   By: Jeannine Boga M.D.   On: 12/05/2017 02:55     The results of significant diagnostics from this hospitalization (including imaging, microbiology, ancillary and laboratory) are listed below for reference.     Microbiology: Recent Results (from the past 240 hour(s))  Urine culture     Status: Abnormal   Collection Time: 12/05/17  5:12 AM  Result Value Ref Range Status   Specimen Description   Final    URINE, RANDOM Performed at [facility] [address] [place], [place] [address]    Special Requests   Final    NONE Performed at [facility], [place] [address] [place], [place] [address]    Culture (A)  Final    <10,000 COLONIES/mL INSIGNIFICANT GROWTH Performed at [facility] [address] [place], [place]  [address]    Report Status 12/06/2017 FINAL  Final  Culture, blood (routine x 2)     Status: None (Preliminary result)   Collection Time: 12/05/17  6:58 AM  Result Value Ref Range Status   Specimen Description   Final    BLOOD RIGHT HAND Performed at [facility] [address], [place], [place] [address]    Special Requests   Final    BOTTLES DRAWN AEROBIC ONLY Blood Culture adequate volume Performed at [facility] [address], [place], [place] [address]    Culture   Final    NO GROWTH 4 DAYS Performed at [facility], [place] [address], [place], [place] [address]    Report Status PENDING  Incomplete  Culture, blood (routine x 2)     Status: None (Preliminary result)   Collection Time: 12/05/17  7:46 AM  Result Value Ref Range Status   Specimen Description   Final    BLOOD LEFT HAND Performed at [facility] [address], [place], [place] [address]    Special Requests   Final    BOTTLES DRAWN AEROBIC ONLY Blood Culture adequate volume Performed at [facility] [address], [place], [place] [address]    Culture   Final    NO GROWTH 4 DAYS Performed at [facility], [place] [address] [place], [place] [address]    Report Status PENDING  Incomplete  MRSA PCR Screening     Status: None   Collection Time: 12/05/17  7:53 AM  Result Value Ref Range Status   MRSA by PCR NEGATIVE NEGATIVE Final    Comment:        The GeneXpert MRSA Assay (FDA approved for NASAL specimens only), is one component of a comprehensive MRSA colonization surveillance program. It is not intended to diagnose MRSA infection nor to guide or monitor treatment for MRSA infections. Performed at [facility], [place] [address], [place], [place] [address]      Labs: BNP (last 3 results) Recent Labs    12/05/17 0244  BNP 75.9   Basic Metabolic Panel: Recent Labs  Lab 12/05/17 0632   12/06/17 0202  12/06/17 1950 12/07/17 0100 12/07/17 0416 12/08/17 0609 12/09/17 0604  NA 142   < > 146*   < >  137 138 141 140 142  K 4.3   < > 3.2*   < > 3.3* 2.8* 3.2* 3.2* 3.6  CL 110   < > 116*   < > 108 109 110 107 106  CO2 7*   < > 19*   < > 21* 22 21* 27 30  GLUCOSE 358*   < > 158*   < > 448* 210* 197* 338* 192*  BUN 14   < > 12   < > _0 5* <5*  CREATININE 1.56*   < > 1.13   < > 1.08 1.02 1.04 1.14 1.07  CALCIUM 8.4*   < > 8.8*   < > 8.9 8.8* 9.0 9.0 9.2  MG 2.3  --  2.3  --   --   --  2.2  --  2.1  PHOS 5.2*  --  1.8*  --   --   --   --   --  2.0*   < > = values in this interval not displayed.   Liver Function Tests: Recent Labs  Lab 12/05/17 0243 12/09/17 0604  AST 24 27  ALT 27 24  ALKPHOS 101 57  BILITOT 1.1 0.5  PROT 8.1 5.2*  ALBUMIN 4.2 2.7*   No results for input(s): LIPASE, AMYLASE in the last 168 hours. No results for input(s): AMMONIA in the last 168 hours. CBC: Recent Labs  Lab 12/05/17 0243 12/06/17 0202 12/07/17 0416 12/08/17 0609 12/09/17 0604  WBC 15.6* 7.0 4.8 4.8 4.2  NEUTROABS 13.0*  --   --  2.9 2.4  HGB 16.1 12.2* 12.9* 11.7* 11.2*  HCT 48.1 35.4* 37.2* 34.4* 33.5*  MCV 89.4 84.1 84.2 86.4 87.5  PLT 342 201 146* 158 144*   Cardiac Enzymes: No results for input(s): CKTOTAL, CKMB, CKMBINDEX, TROPONINI in the last 168 hours. BNP: Invalid input(s): POCBNP CBG: Recent Labs  Lab 12/08/17 1155 12/08/17 1727 12/08/17 2147 12/09/17 0736 12/09/17 1208  GLUCAP 248* 220* 286* 182* 262*   D-Dimer No results for input(s): DDIMER in the last 72 hours. Hgb A1c Recent Labs    12/09/17 0604  HGBA1C 16.7*   Lipid Profile No results for input(s): CHOL, HDL, LDLCALC, TRIG, CHOLHDL, LDLDIRECT in the last 72 hours. Thyroid function studies No results for input(s): TSH, T4TOTAL, T3FREE, THYROIDAB in the last 72 hours.  Invalid input(s): FREET3 Anemia work up Recent Labs    12/09/17 0604  VITAMINB12 549  FOLATE 10.2  FERRITIN 336   TIBC 200*  IRON 80  RETICCTPCT 1.7   Urinalysis    Component Value Date/Time   COLORURINE YELLOW 12/05/2017 [place] 12/05/2017 0512   LABSPEC 1.018 12/05/2017 0512   PHURINE 5.0 12/05/2017 0512   GLUCOSEU >=500 (A) 12/05/2017 0512   HGBUR MODERATE (A) 12/05/2017 0512   BILIRUBINUR NEGATIVE 12/05/2017 0512   BILIRUBINUR negative 12/18/2016 1722   KETONESUR 80 (A) 12/05/2017 0512   PROTEINUR 100 (A) 12/05/2017 0512   UROBILINOGEN 0.2 12/18/2016 1722   UROBILINOGEN 0.2 02/16/2016 1840   NITRITE NEGATIVE 12/05/2017 0512   LEUKOCYTESUR NEGATIVE 12/05/2017 0512   Sepsis Labs Invalid input(s): PROCALCITONIN,  WBC,  LACTICIDVEN Microbiology Recent Results (from the past 240 hour(s))  Urine culture     Status: Abnormal   Collection Time: 12/05/17  5:12 AM  Result Value Ref Range Status   Specimen Description   Final    URINE, RANDOM Performed at [facility], [place] [address] [place], [place] [address]  Special Requests   Final    NONE Performed at [facility], [place] [address] [place], [place] [address]    Culture (A)  Final    <10,000 COLONIES/mL INSIGNIFICANT GROWTH Performed at [facility] [address] [place], [place] [address]    Report Status 12/06/2017 FINAL  Final  Culture, blood (routine x 2)     Status: None (Preliminary result)   Collection Time: 12/05/17  6:58 AM  Result Value Ref Range Status   Specimen Description   Final    BLOOD RIGHT HAND Performed at [facility] [address], [place], [place] [address]    Special Requests   Final    BOTTLES DRAWN AEROBIC ONLY Blood Culture adequate volume Performed at [facility] [address], [place], [place] [address]    Culture   Final    NO GROWTH 4 DAYS Performed at [facility], [place] [address], [place], [place] [address]    Report Status PENDING  Incomplete  Culture, blood (routine x 2)      Status: None (Preliminary result)   Collection Time: 12/05/17  7:46 AM  Result Value Ref Range Status   Specimen Description   Final    BLOOD LEFT HAND Performed at [facility] [address] [place], [place] [address]    Special Requests   Final    BOTTLES DRAWN AEROBIC ONLY Blood Culture adequate volume Performed at [facility] [address], [place], [place] [address]    Culture   Final    NO GROWTH 4 DAYS Performed at [facility], [place] [address], [place], [place] [address]    Report Status PENDING  Incomplete  MRSA PCR Screening     Status: None   Collection Time: 12/05/17  7:53 AM  Result Value Ref Range Status   MRSA by PCR NEGATIVE NEGATIVE Final    Comment:        The GeneXpert MRSA Assay (FDA approved for NASAL specimens only), is one component of a comprehensive MRSA colonization surveillance program. It is not intended to diagnose MRSA infection nor to guide or monitor treatment for MRSA infections. Performed at [facility], [place] [address] [place], [place] [address]      Time coordinating discharge in minutes: 51  SIGNED:   Debbe Odea, MD  [facility] 12/09/2017, 3:06 PM Pager   If 7PM-7AM, please contact night-coverage [email or web address] Password [email or web address]

## 2017-12-10 ENCOUNTER — Inpatient Hospital Stay (HOSPITAL_COMMUNITY)
Admission: AD | Admit: 2017-12-10 | Discharge: 2017-12-16 | DRG: 885 | Disposition: A | Discharge status: Other Acute Inpatient Hospital | Payer: Medicaid Other | Source: Intra-hospital | Attending: Psychiatry | Admitting: Psychiatry

## 2017-12-10 ENCOUNTER — Encounter (HOSPITAL_COMMUNITY): Payer: Self-pay | Admitting: *Deleted

## 2017-12-10 ENCOUNTER — Other Ambulatory Visit: Payer: Self-pay

## 2017-12-10 DIAGNOSIS — Z794 Long term (current) use of insulin: Secondary | ICD-10-CM

## 2017-12-10 DIAGNOSIS — E781 Pure hyperglyceridemia: Secondary | ICD-10-CM | POA: Diagnosis present

## 2017-12-10 DIAGNOSIS — Z8249 Family history of ischemic heart disease and other diseases of the circulatory system: Secondary | ICD-10-CM

## 2017-12-10 DIAGNOSIS — Z818 Family history of other mental and behavioral disorders: Secondary | ICD-10-CM

## 2017-12-10 DIAGNOSIS — Z560 Unemployment, unspecified: Secondary | ICD-10-CM

## 2017-12-10 DIAGNOSIS — Z91013 Allergy to seafood: Secondary | ICD-10-CM

## 2017-12-10 DIAGNOSIS — Z833 Family history of diabetes mellitus: Secondary | ICD-10-CM

## 2017-12-10 DIAGNOSIS — Z9114 Patient's other noncompliance with medication regimen: Secondary | ICD-10-CM

## 2017-12-10 DIAGNOSIS — Z888 Allergy status to other drugs, medicaments and biological substances status: Secondary | ICD-10-CM

## 2017-12-10 DIAGNOSIS — R202 Paresthesia of skin: Secondary | ICD-10-CM | POA: Diagnosis not present

## 2017-12-10 DIAGNOSIS — Z881 Allergy status to other antibiotic agents status: Secondary | ICD-10-CM

## 2017-12-10 DIAGNOSIS — R45851 Suicidal ideations: Secondary | ICD-10-CM | POA: Diagnosis present

## 2017-12-10 DIAGNOSIS — Z6834 Body mass index (BMI) 34.0-34.9, adult: Secondary | ICD-10-CM

## 2017-12-10 DIAGNOSIS — E669 Obesity, unspecified: Secondary | ICD-10-CM | POA: Diagnosis present

## 2017-12-10 DIAGNOSIS — Z79899 Other long term (current) drug therapy: Secondary | ICD-10-CM

## 2017-12-10 DIAGNOSIS — F419 Anxiety disorder, unspecified: Secondary | ICD-10-CM | POA: Diagnosis present

## 2017-12-10 DIAGNOSIS — Z91030 Bee allergy status: Secondary | ICD-10-CM

## 2017-12-10 DIAGNOSIS — F332 Major depressive disorder, recurrent severe without psychotic features: Principal | ICD-10-CM | POA: Diagnosis present

## 2017-12-10 LAB — BASIC METABOLIC PANEL
Anion gap: 6 (ref 5–15)
BUN: <5 mg/dL — ABNORMAL LOW (ref 6–20)
CHLORIDE: 106 mmol/L (ref 98–111)
CO2: 30 mmol/L (ref 22–32)
CREATININE: 1.08 mg/dL (ref 0.61–1.24)
Calcium: 8.9 mg/dL (ref 8.9–10.3)
GFR calc non Af Amer: >60 mL/min (ref >60)
Glucose, Bld: 186 mg/dL — ABNORMAL HIGH (ref 70–99)
POTASSIUM: 3.7 mmol/L (ref 3.5–5.1)
SODIUM: 142 mmol/L (ref 135–145)

## 2017-12-10 LAB — CULTURE, BLOOD (ROUTINE X 2)
CULTURE: NO GROWTH
Culture: NO GROWTH
SPECIAL REQUESTS: ADEQUATE
Special Requests: ADEQUATE

## 2017-12-10 LAB — PHOSPHORUS: PHOSPHORUS: 2.4 mg/dL — AB (ref 2.5–4.6)

## 2017-12-10 LAB — GLUCOSE, CAPILLARY
GLUCOSE-CAPILLARY: 236 mg/dL — AB (ref 70–99)
GLUCOSE-CAPILLARY: 210 mg/dL — AB (ref 70–99)
Glucose-Capillary: 161 mg/dL — ABNORMAL HIGH (ref 70–99)
Glucose-Capillary: 321 mg/dL — ABNORMAL HIGH (ref 70–99)

## 2017-12-10 MED ORDER — INSULIN ASPART 100 UNIT/ML ~~LOC~~ SOLN
8.0000 [IU] | Freq: Three times a day (TID) | SUBCUTANEOUS | Status: DC
  Administered 2017-12-11 (×2): 8 [IU] via SUBCUTANEOUS

## 2017-12-10 MED ORDER — BUSPIRONE HCL 10 MG PO TABS
10.0000 mg | ORAL_TABLET | Freq: Three times a day (TID) | ORAL | Status: DC
  Administered 2017-12-10 – 2017-12-16 (×18): 10 mg via ORAL
  Filled 2017-12-10 (×2): qty 1
  Filled 2017-12-10: qty 2
  Filled 2017-12-10 (×20): qty 1
  Filled 2017-12-10: qty 2
  Filled 2017-12-10: qty 1

## 2017-12-10 MED ORDER — FLUOXETINE HCL 40 MG PO CAPS
40.0000 mg | ORAL_CAPSULE | Freq: Every day | ORAL | Status: DC
  Administered 2017-12-11 – 2017-12-16 (×6): 40 mg via ORAL
  Filled 2017-12-10 (×8): qty 1

## 2017-12-10 MED ORDER — METFORMIN HCL 500 MG PO TABS
1000.0000 mg | ORAL_TABLET | Freq: Two times a day (BID) | ORAL | Status: DC
  Administered 2017-12-10 – 2017-12-16 (×12): 1000 mg via ORAL
  Filled 2017-12-10 (×17): qty 2

## 2017-12-10 MED ORDER — POLYETHYLENE GLYCOL 3350 17 G PO PACK
17.0000 g | PACK | Freq: Every day | ORAL | Status: DC
  Filled 2017-12-10 (×10): qty 1

## 2017-12-10 MED ORDER — INSULIN GLARGINE 100 UNIT/ML ~~LOC~~ SOLN
25.0000 [IU] | Freq: Every day | SUBCUTANEOUS | Status: DC
  Administered 2017-12-11 – 2017-12-16 (×6): 25 [IU] via SUBCUTANEOUS

## 2017-12-10 MED ORDER — FLUOXETINE HCL 20 MG PO CAPS: 20.0000 mg | ORAL_CAPSULE | Freq: Every day | ORAL | Status: DC

## 2017-12-10 MED ORDER — PANTOPRAZOLE SODIUM 40 MG PO TBEC
40.0000 mg | DELAYED_RELEASE_TABLET | Freq: Two times a day (BID) | ORAL | Status: DC
  Administered 2017-12-10 – 2017-12-16 (×12): 40 mg via ORAL
  Filled 2017-12-10 (×18): qty 1

## 2017-12-10 MED ORDER — INSULIN ASPART 100 UNIT/ML ~~LOC~~ SOLN
3.0000 [IU] | Freq: Once | SUBCUTANEOUS | Status: AC
  Administered 2017-12-10: 3 [IU] via SUBCUTANEOUS

## 2017-12-10 NOTE — Progress Notes (Signed)
Inpatient Diabetes Program Recommendations  AACE/ADA: New Consensus Statement on Inpatient Glycemic Control (2015)  Target Ranges:  Prepandial:   less than 140 mg/dL      Peak postprandial:   less than 180 mg/dL (1-2 hours)      Critically ill patients:  140 - 180 mg/dL   Lab Results  Component Value Date   GLUCAP 161 (H) 12/10/2017   HGBA1C 16.7 (H) 12/09/2017    Review of Glycemic Control  Post-prandial blood sugars elevated.  FBS acceptable. Needs increase in Novolog.    Inpatient Diabetes Program Recommendations:     Increase Novolog to 10 units tidwc for meal coverage insulin.  Continue to follow.  Thank you. Lorenda Peck, RD, LDN, CDE Inpatient Diabetes Coordinator [phone]

## 2017-12-10 NOTE — Progress Notes (Signed)
Marc Long is a 42 year old male pt admitted on voluntary basis from medical floor. On admission he spoke about how he did not take his diabetic medications with the hopes that would kill him. He reports that he got to the hospital because his roommate called 911 because he was acting bizarre. He denies any SI currently on admission and is able to contract for safety while in the hospital. He denies any substance abuse issues. He reports that he had been taking his medications and reports that he was going to the [place] but reports that he lost his job and insurance and spoke about how he was then referred to [facility] where he is currently going. He reports that he does not believe his current medication regiment is working. He reports that he lives with a roommate and reports that he will return to the same living situation upon discharge. Ariel was oriented to the unit and safety maintained.

## 2017-12-10 NOTE — Progress Notes (Signed)
Patient has bed at [facility] on 12/10/17. SW is notifed. Discharge instruction including medication and appointment was given to patient and patient has no question at this time.

## 2017-12-10 NOTE — Progress Notes (Signed)
Nursing Progress Note: 7p-7a D: Pt currently presents with a anxious/childlike affect and behavior. Interacting minimally with the milieu. Pt reports poor sleep during the previous night with current medication regimen.   A: Pt provided with medications per providers orders. Pt's labs and vitals were monitored throughout the night. Pt supported emotionally and encouraged to express concerns and questions. Pt educated on medications.  R: Pt's safety ensured with 15 minute and environmental checks. Pt currently denies SI, HI, and AVH. Pt verbally contracts to seek staff if SI,HI, or AVH occurs and to consult with staff before acting on any harmful thoughts. Will continue to monitor.

## 2017-12-10 NOTE — Care Management Note (Signed)
Case Management Note  Patient Details  Name: Marc Long MRN: [id number] Date of Birth: 03-Dec-1975  Subjective/Objective:                  discharge  Action/Plan: No needs present  Expected Discharge Date:  12/09/17               Expected Discharge Plan:  Home/Self Care  In-House Referral:     Discharge planning Services  CM Consult  Post Acute Care Choice:    Choice offered to:     DME Arranged:    DME Agency:     HH Arranged:    [place] Agency:     Status of Service:  Completed, signed off  If discussed at [employer] of Stay Meetings, dates discussed:    Additional Comments:  Leeroy Cha, RN 12/10/2017, 9:49 AM

## 2017-12-10 NOTE — Progress Notes (Signed)
[facility]  The patient was discharged yesterday but is awaiting a behavioral medicine bed.  His sugars are stable today. He complains of blurred vision and I have recommended he see an ophthalmologist as and outpt as he is likely developing diabetic retinopathy. No further complaints. His mother is at bedside and has been updated.   Debbe Odea, MD Pager: [email or web address]

## 2017-12-10 NOTE — Progress Notes (Signed)
LCSW attempted to reach [facility] several times yesterday and this morning with no answer.   [facility] states they are not accepting any patients today.   LCSW faxed patient out to other facilities.   LCSW will continue to follow for placement.   Carolin Coy [place] Long [place]

## 2017-12-10 NOTE — Tx Team (Signed)
Initial Treatment Plan 12/10/2017 5:12 PM Marc Long GGE:[id number]    PATIENT STRESSORS: Financial difficulties Health problems Medication change or noncompliance Occupational concerns   PATIENT STRENGTHS: Ability for insight Average or above average intelligence Capable of independent living General fund of knowledge   PATIENT IDENTIFIED PROBLEMS: Depression Anxiety Suicidal thoughts "I don't know what's going to help, I just hope something will"                     DISCHARGE CRITERIA:  Ability to meet basic life and health needs Improved stabilization in mood, thinking, and/or behavior Reduction of life-threatening or endangering symptoms to within safe limits Verbal commitment to aftercare and medication compliance  PRELIMINARY DISCHARGE PLAN: Attend aftercare/continuing care group Return to previous living arrangement  PATIENT/FAMILY INVOLVEMENT: This treatment plan has been presented to and reviewed with the patient, Marc Long, and/or family member, .  The patient and family have been given the opportunity to ask questions and make suggestions.  Elbie Statzer, [place], [place] 12/10/2017, 5:12 PM

## 2017-12-10 NOTE — Progress Notes (Signed)
RN called to give a report at 1420; No question at this time.

## 2017-12-10 NOTE — Progress Notes (Signed)
Patient has bed at [facility] today.    LCSW will update RN when patient can transport.   LCSW will continue to follow for dc needs.

## 2017-12-10 NOTE — Progress Notes (Addendum)
LCSW following for inpatient psych placement.  Patient has been accepted at [facility]. Room 404 bed 2.   Attending: [place]  Patient can arrive at 4pm. Patient will transport by [place]. Transportation arranged at 2:10pm.  RN report number: [phone]  Servando Snare, [place] [phone]

## 2017-12-11 DIAGNOSIS — Z79899 Other long term (current) drug therapy: Secondary | ICD-10-CM

## 2017-12-11 DIAGNOSIS — R45851 Suicidal ideations: Secondary | ICD-10-CM

## 2017-12-11 DIAGNOSIS — Z818 Family history of other mental and behavioral disorders: Secondary | ICD-10-CM

## 2017-12-11 DIAGNOSIS — F332 Major depressive disorder, recurrent severe without psychotic features: Principal | ICD-10-CM

## 2017-12-11 LAB — GLUCOSE, CAPILLARY
GLUCOSE-CAPILLARY: 205 mg/dL — AB (ref 70–99)
GLUCOSE-CAPILLARY: 231 mg/dL — AB (ref 70–99)
GLUCOSE-CAPILLARY: 336 mg/dL — AB (ref 70–99)
GLUCOSE-CAPILLARY: 202 mg/dL — AB (ref 70–99)
GLUCOSE-CAPILLARY: 118 mg/dL — AB (ref 70–99)

## 2017-12-11 LAB — LITHIUM LEVEL: LITHIUM LVL: 1.05 mmol/L (ref 0.60–1.20)

## 2017-12-11 MED ORDER — INSULIN ASPART 100 UNIT/ML ~~LOC~~ SOLN
10.0000 [IU] | Freq: Three times a day (TID) | SUBCUTANEOUS | Status: DC
  Administered 2017-12-11 – 2017-12-16 (×15): 10 [IU] via SUBCUTANEOUS

## 2017-12-11 MED ORDER — ARIPIPRAZOLE 5 MG PO TABS
5.0000 mg | ORAL_TABLET | Freq: Every day | ORAL | Status: DC
Start: 2017-12-11 — End: 2017-12-13
  Administered 2017-12-11 – 2017-12-13 (×3): 5 mg via ORAL
  Filled 2017-12-11 (×6): qty 1

## 2017-12-11 MED ORDER — LAMOTRIGINE 25 MG PO TABS
50.0000 mg | ORAL_TABLET | Freq: Two times a day (BID) | ORAL | Status: DC
  Administered 2017-12-11 – 2017-12-13 (×4): 50 mg via ORAL
  Filled 2017-12-11 (×9): qty 2
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
## 2017-12-11 NOTE — Progress Notes (Signed)
The focus of this group is to help patients review their daily goal of treatment and discuss progress on daily workbooks. Pt attended the evening group session and responded to all discussion prompts from the [place]. Pt shared that today was a good day on the unit, the highlight of which was laughing. "I haven't done that in a while."  When asked for one thing he would do differently upon discharge to stay well, Pt responded: "I don't know. That's what I'm here to find out. But I first need to stop feeling how I'm feeling and find some reasons to live."  Pt reported having no additional needs from Nursing Staff this evening and his affect was appropriate.
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
## 2017-12-11 NOTE — Plan of Care (Signed)
  Problem: Education: Goal: Emotional status will improve Outcome: Progressing   

## 2017-12-11 NOTE — Progress Notes (Signed)
D Pt is seen [facility]AL on the 400 hall today he tolerates this well. HE is pleasant, cooperative and says he's not exactly sure why he's here now.    A HE completes his dialy assessment and on this he wrote he has experienced SI today and he rated his depression, hopelessness and anxeity " 6/7/0", respectively. He is observed sitting in the dayrom tis afternoon and says he si " getting more comfortable " being ehre.    R Safety is in place.

## 2017-12-11 NOTE — Tx Team (Signed)
Interdisciplinary Treatment and Diagnostic Plan Update  12/11/2017 Time of Session: 9am Woodard Perrell MRN: [id number]  Principal Diagnosis: <principal problem not specified>  Secondary Diagnoses: Active Problems:   MDD (major depressive disorder), recurrent episode, severe (HCC)   Current Medications:  Current Facility-Administered Medications  Medication Dose Route Frequency Provider Last Rate Last Dose  . ARIPiprazole (ABILIFY) tablet 5 mg  5 mg Oral Daily Cobos, Fernando A, MD      . busPIRone (BUSPAR) tablet 10 mg  10 mg Oral TID Rankin, Shuvon B, NP   10 mg at 12/11/17 1212  . FLUoxetine (PROZAC) capsule 40 mg  40 mg Oral Daily Rankin, Shuvon B, NP   40 mg at 12/11/17 1011  . insulin aspart (novoLOG) injection 10 Units  10 Units Subcutaneous TID WC Cobos, Fernando A, MD      . insulin glargine (LANTUS) injection 25 Units  25 Units Subcutaneous Daily Rankin, Shuvon B, NP   25 Units at 12/11/17 1013  . lamoTRIgine (LAMICTAL) tablet 50 mg  50 mg Oral BID Cobos, Fernando A, MD      . metFORMIN (GLUCOPHAGE) tablet 1,000 mg  1,000 mg Oral BID WC Rankin, Shuvon B, NP   1,000 mg at 12/11/17 1011  . pantoprazole (PROTONIX) EC tablet 40 mg  40 mg Oral BID Rankin, Shuvon B, NP   40 mg at 12/11/17 1011  . polyethylene glycol (MIRALAX / GLYCOLAX) packet 17 g  17 g Oral Daily Rankin, Shuvon B, NP       PTA Medications: Medications Prior to Admission  Medication Sig Dispense Refill Last Dose  . busPIRone (BUSPAR) 10 MG tablet Take 10 mg by mouth 3 (three) times daily.  1   . dapagliflozin propanediol (FARXIGA) 10 MG TABS tablet Take 10 mg by mouth daily. (Patient not taking: Reported on 03/31/2017) 30 tablet 5 Not Taking at Unknown time  . Exenatide ER (BYDUREON BCISE) 2 MG/0.85ML AUIJ Inject 2 mg into the skin once a week. 4 pen 5 04/09/2017  . famotidine (PEPCID) 40 MG tablet Take 1 tablet (40 mg total) by mouth at bedtime.     . feeding supplement, GLUCERNA SHAKE, (GLUCERNA SHAKE) LIQD Take 237  mLs by mouth 3 (three) times daily with meals.  0   . FLUoxetine (PROZAC) 40 MG capsule Take 1 capsule (40 mg total) by mouth daily.  3   . insulin aspart (NOVOLOG) 100 UNIT/ML injection Inject 8 Units into the skin 3 (three) times daily with meals. 10 mL 11   . insulin glargine (LANTUS) 100 UNIT/ML injection Inject 0.25 mLs (25 Units total) into the skin daily. 10 mL 11   . lamoTRIgine (LAMICTAL) 200 MG tablet TAKE 200 MG BY MOUTH ONCE DAILY  1 04/13/2017 at Unknown time  . lithium carbonate (LITHOBID) 300 MG CR tablet Take 3 (900 MG) tablets by mouth daily  1 04/13/2017 at Unknown time  . metFORMIN (GLUCOPHAGE) 1000 MG tablet Take 1 tablet (1,000 mg total) by mouth 2 (two) times daily with a meal. 180 tablet 1 04/13/2017 at Unknown time  . pantoprazole (PROTONIX) 40 MG tablet Take 1 tablet (40 mg total) by mouth 2 (two) times daily.     . polyethylene glycol (MIRALAX / GLYCOLAX) packet Take 17 g by mouth daily. 14 each 0   . pramipexole (MIRAPEX) 0.25 MG tablet Take 0.75 mg by mouth at bedtime.       Patient Stressors: Financial difficulties Health problems Medication change or noncompliance Occupational concerns  Patient Strengths:  Ability for insight Average or above average intelligence Capable of independent living General fund of knowledge  Treatment Modalities: Medication Management, Group therapy, Case management,  1 to 1 session with clinician, Psychoeducation, Recreational therapy.   Physician Treatment Plan for Primary Diagnosis: <principal problem not specified> Long Term Goal(s): Improvement in symptoms so as ready for discharge Improvement in symptoms so as ready for discharge   Short Term Goals: Ability to identify changes in lifestyle to reduce recurrence of condition will improve Ability to identify and develop effective coping behaviors will improve Ability to identify changes in lifestyle to reduce recurrence of condition will improve Ability to verbalize feelings  will improve Ability to disclose and discuss suicidal ideas Ability to demonstrate self-control will improve Ability to identify and develop effective coping behaviors will improve Ability to maintain clinical measurements within normal limits will improve  Medication Management: Evaluate patient's response, side effects, and tolerance of medication regimen.  Therapeutic Interventions: 1 to 1 sessions, Unit Group sessions and Medication administration.  Evaluation of Outcomes: Not Met  Physician Treatment Plan for Secondary Diagnosis: Active Problems:   MDD (major depressive disorder), recurrent episode, severe ([place])  Long Term Goal(s): Improvement in symptoms so as ready for discharge Improvement in symptoms so as ready for discharge   Short Term Goals: Ability to identify changes in lifestyle to reduce recurrence of condition will improve Ability to identify and develop effective coping behaviors will improve Ability to identify changes in lifestyle to reduce recurrence of condition will improve Ability to verbalize feelings will improve Ability to disclose and discuss suicidal ideas Ability to demonstrate self-control will improve Ability to identify and develop effective coping behaviors will improve Ability to maintain clinical measurements within normal limits will improve     Medication Management: Evaluate patient's response, side effects, and tolerance of medication regimen.  Therapeutic Interventions: 1 to 1 sessions, Unit Group sessions and Medication administration.  Evaluation of Outcomes: Not Met   RN Treatment Plan for Primary Diagnosis: <principal problem not specified> Long Term Goal(s): Knowledge of disease and therapeutic regimen to maintain health will improve  Short Term Goals: Ability to disclose and discuss suicidal ideas, Ability to identify and develop effective coping behaviors will improve and Compliance with prescribed medications will  improve  Medication Management: RN will administer medications as ordered by provider, will assess and evaluate patient's response and provide education to patient for prescribed medication. RN will report any adverse and/or side effects to prescribing provider.  Therapeutic Interventions: 1 on 1 counseling sessions, Psychoeducation, Medication administration, Evaluate responses to treatment, Monitor vital signs and CBGs as ordered, Perform/monitor CIWA, COWS, AIMS and Fall Risk screenings as ordered, Perform wound care treatments as ordered.  Evaluation of Outcomes: Not Met   LCSW Treatment Plan for Primary Diagnosis: <principal problem not specified> Long Term Goal(s): Safe transition to appropriate next level of care at discharge, Engage patient in therapeutic group addressing interpersonal concerns.  Short Term Goals: Engage patient in aftercare planning with referrals and resources  Therapeutic Interventions: Assess for all discharge needs, 1 to 1 time with Social worker, Explore available resources and support systems, Assess for adequacy in community support network, Educate family and significant other(s) on suicide prevention, Complete Psychosocial Assessment, Interpersonal group therapy.  Evaluation of Outcomes: Not Met   Progress in Treatment: Attending groups: Yes. Participating in groups: Yes. Taking medication as prescribed: Yes. Toleration medication: Yes. Family/Significant other contact made: No, will contact:  patient refuses consent Patient understands diagnosis: Yes. Discussing patient identified  problems/goals with staff: Yes. Medical problems stabilized or resolved: Yes. Denies suicidal/homicidal ideation: Yes. Issues/concerns per patient self-inventory: Yes. Other:   New problem(s) identified: None   New Short Term/Long Term Goal(s): medication stabilization, elimination of SI thoughts, development of comprehensive mental wellness plan.    Patient Goals:   "don't know what's going to help, I just hope something will"  Discharge Plan or Barriers: CSW will assess for appropriate referrals.   Reason for Continuation of Hospitalization: Depression Medication stabilization Suicidal ideation  Estimated Length of Stay:3-5 days   Attendees: Patient: Marc Long  12/11/2017 4:14 PM  Physician: Dr. Neita Garnet, MD 12/11/2017 4:14 PM  Nursing: Chong Sicilian. D, RN 12/11/2017 4:14 PM  RN Care Manager: Rhunette Croft 12/11/2017 4:14 PM  Social Worker: Radonna Ricker, [place] 12/11/2017 4:14 PM  Recreational Therapist: X 12/11/2017 4:14 PM  Other: X 12/11/2017 4:14 PM  Other: X 12/11/2017 4:14 PM  Other:x 12/11/2017 4:14 PM    Scribe for Treatment Team: Marylee Floras, [place] 12/11/2017 4:14 PM

## 2017-12-11 NOTE — Progress Notes (Signed)
Recreation Therapy Notes  Date: 6.28.19 Time: 0930 Location: 300 Hall Dayroom  Group Topic: Stress Management  Goal Area(s) Addresses:  Patient will verbalize importance of using healthy stress management.  Patient will identify positive emotions associated with healthy stress management.   Intervention: Stress Management  Activity :  [employer].  LRT introduced the stress management technique of meditation.  LRT read a script on how mountains stay the same in spite of the things that go on around them.  Education:  Stress Management, Discharge Planning.   Education Outcome: Acknowledges edcuation/In group clarification offered/Needs additional education  Clinical Observations/Feedback: Pt did not attend group.    Victorino Sparrow, LRT/CTRS         Victorino Sparrow A 12/11/2017 12:22 PM

## 2017-12-11 NOTE — H&P (Addendum)
Psychiatric Admission Assessment Adult  Patient Identification: Marc Long MRN:  [id number] Date of Evaluation:  12/11/2017 Chief Complaint: depression Principal Diagnosis: MDD Diagnosis:   Patient Active Problem List   Diagnosis Date Noted  . MDD (major depressive disorder), recurrent episode, severe ([place]) [F33.2] 12/10/2017  . Obesity (BMI 30-39.9) [E66.9] 12/09/2017  . Depression with suicidal ideation [F32.9, R45.851]   . DKA (diabetic ketoacidoses) ([place]) [E13.10] 12/05/2017  . Hypertriglyceridemia [E78.1] 02/05/2017  . DM (diabetes mellitus), type 2, uncontrolled ([place]) [E11.65] 12/12/2016  . Dehydration [E86.0] 12/12/2016  . Suicidal ideations [R45.851] 12/12/2016  . Bipolar disorder (manic depression) ([place]) [F31.9] 12/12/2016  . Hyperglycemia due to type 2 diabetes mellitus ([place]) [E11.65] 12/12/2016  . AKI (acute kidney injury) ([place]) [N17.9] 12/12/2016  . DKA, type 2, not at goal [facility]) [E11.10] 12/12/2016  . Obesity, Class III, BMI 40-49.9 (morbid obesity) ([place]) [E66.01]   . Type 2 diabetes mellitus without complication, without long-term current use of insulin ([place]) [E11.9] 12/10/2016  . Lithium use [Z79.899] 12/10/2016  . Moderate single current episode of major depressive disorder ([place]) [F32.1] 04/15/2016  . Pain in joint, shoulder region [M25.519] 04/14/2016  . Diverticulosis of large intestine without hemorrhage [K57.30] 11/07/2015  . Calculus of gallbladder without cholecystitis [K80.20] 11/07/2015   History of Present Illness: 42 year old male, single, lives with roommate  Admitted to inpatient medical unit with DKA on 6/22. Seen by Psychiatrist CL consultant, who recommended inpatient psychiatric treatment upon medical clearance. Reports he has been feeling depressed for several months, states his depression is chronic but has tended to worsen, feeling he had nothing to live for,experiencing thoughts of death, describes " seeing images of me killing myself ".  He  states  he had stopped diabetic medications/stopped taking proper care of his DM in order to die. Endorses neuro-vegetative symptoms of depression. Associated Signs/Symptoms: Depression Symptoms:  depressed mood, anhedonia, hypersomnia, suicidal thoughts with specific plan, loss of energy/fatigue, decreased appetite, (Hypo) Manic Symptoms:  Does not present with or endorse  Anxiety Symptoms:  Denies significant anxiety Psychotic Symptoms:  Denies psychotic symptoms. PTSD Symptoms: Denies  Total Time spent with patient: 45 minutes  Past Psychiatric History: no prior psychiatric admissions . He has been diagnosed with Bipolar Disorder in the past . Does not describe any clear history of mania or hypomania, does endorse history of depression.  Reports he has never attempted suicide. Denies history of self cutting or self injurious behaviors. Denies history of psychosis. Denies history of violence .   Is the patient at risk to self? Yes.    Has the patient been a risk to self in the past 6 months? Yes.    Has the patient been a risk to self within the distant past? Yes.    Is the patient a risk to others? No.  Has the patient been a risk to others in the past 6 months? No.  Has the patient been a risk to others within the distant past? No.   Prior Inpatient Therapy:  as above  Prior Outpatient Therapy:  reports he was [facility] up at [facility]  Alcohol Screening: 1. How often do you have a drink containing alcohol?: 2 to 4 times a month 2. How many drinks containing alcohol do you have on a typical day when you are drinking?: 1 or 2 3. How often do you have six or more drinks on one occasion?: Less than monthly AUDIT-C Score: 3 4. How often during the last year have you found that  you were not able to stop drinking once you had started?: Never 5. How often during the last year have you failed to do what was normally expected from you becasue of drinking?: Never 6. How often during the last  year have you needed a first drink in the morning to get yourself going after a heavy drinking session?: Never 7. How often during the last year have you had a feeling of guilt of remorse after drinking?: Never 8. How often during the last year have you been unable to remember what happened the night before because you had been drinking?: Less than monthly 9. Have you or someone else been injured as a result of your drinking?: No 10. Has a relative or friend or a doctor or another health worker been concerned about your drinking or suggested you cut down?: No Alcohol Use Disorder Identification Test Final Score (AUDIT): 4 Intervention/Follow-up: AUDIT Score <7 follow-up not indicated Substance Abuse History in the last 12 months:  Denies alcohol or drug abuse  Consequences of Substance Abuse: Does not endorse Previous Psychotropic Medications: had been on Lithium , Lamictal, Buspar, Prozac prior to admission. States Buspar and Prozac were relatively new, but had been on Lamictal and Lithium " for a while".  Psychological Evaluations: No Past Medical History:  DM, diagnosed about a year ago.  Past Medical History:  Diagnosis Date  . Depression   . Diabetes mellitus without complication ([place])   . Gallstones   . Obesity, Class III, BMI 40-49.9 (morbid obesity) ([place])    History reviewed. No pertinent surgical history. Family History: parents alive, separated, has one younger brother Family History  Problem Relation Age of Onset  . Hypertension Mother   . Depression Mother   . Heart attack Maternal Grandfather   . Lymphoma Maternal Grandmother   . Diabetes Maternal Aunt   . Cancer Maternal Aunt    Family Psychiatric  History: reports mother has history of depression, no suicides in family, biological father had problem with gambling . Tobacco Screening: Have you used any form of tobacco in the last 30 days? (Cigarettes, Smokeless Tobacco, Cigars, and/or Pipes): No Social History: 42 years  old male, single, no children, lives with roommate, receives unemployment benefits  Social History   Substance and Sexual Activity  Alcohol Use Yes  . Alcohol/week: 0.0 oz   Comment: once every 2 weeks. Wine coolers     Social History   Substance and Sexual Activity  Drug Use No    Additional Social History: Marital status: Single Are you sexually active?: No What is your sexual orientation?: Heterosexual  Has your sexual activity been affected by drugs, alcohol, medication, or emotional stress?: No  Does patient have children?: No   Allergies:   Allergies  Allergen Reactions  . Bee Venom Swelling  . Keflex [Cephalexin]     UPSET STOMACH  . Relafen [Nabumetone]     Blood in stool  . Shellfish Allergy Nausea Only   Lab Results:  Results for orders placed or performed during the hospital encounter of 12/10/17 (from the past 48 hour(s))  Glucose, capillary     Status: Abnormal   Collection Time: 12/10/17  5:29 PM  Result Value Ref Range   Glucose-Capillary 210 (H) 70 - 99 mg/dL  Lithium level     Status: None   Collection Time: 12/10/17  6:22 PM  Result Value Ref Range   Lithium Lvl 1.05 0.60 - 1.20 mmol/L    Comment: Performed at [facility]  [facility], [place] [address], [place], [place] [address]  Glucose, capillary     Status: Abnormal   Collection Time: 12/10/17 11:35 PM  Result Value Ref Range   Glucose-Capillary 321 (H) 70 - 99 mg/dL  Glucose, capillary     Status: Abnormal   Collection Time: 12/11/17  6:17 AM  Result Value Ref Range   Glucose-Capillary 231 (H) 70 - 99 mg/dL  Glucose, capillary     Status: Abnormal   Collection Time: 12/11/17 12:04 PM  Result Value Ref Range   Glucose-Capillary 336 (H) 70 - 99 mg/dL    Blood Alcohol level:  Lab Results  Component Value Date   ETH <10 62/69/4854    Metabolic Disorder Labs:  Lab Results  Component Value Date   HGBA1C 16.7 (H) 12/09/2017   MPG 432.59 12/09/2017   No results found for:  PROLACTIN Lab Results  Component Value Date   CHOL 142 03/05/2017   TRIG 148 03/05/2017   HDL 27 (L) 03/05/2017   CHOLHDL 5.3 (H) 03/05/2017   LDLCALC 85 03/05/2017   [place] Comment 12/10/2016    Current Medications: Current Facility-Administered Medications  Medication Dose Route Frequency Provider Last Rate Last Dose  . busPIRone (BUSPAR) tablet 10 mg  10 mg Oral TID Rankin, Shuvon B, NP   10 mg at 12/11/17 1212  . FLUoxetine (PROZAC) capsule 40 mg  40 mg Oral Daily Rankin, Shuvon B, NP   40 mg at 12/11/17 1011  . insulin aspart (novoLOG) injection 8 Units  8 Units Subcutaneous TID WC Rankin, Shuvon B, NP   8 Units at 12/11/17 1212  . insulin glargine (LANTUS) injection 25 Units  25 Units Subcutaneous Daily Rankin, Shuvon B, NP   25 Units at 12/11/17 1013  . metFORMIN (GLUCOPHAGE) tablet 1,000 mg  1,000 mg Oral BID WC Rankin, Shuvon B, NP   1,000 mg at 12/11/17 1011  . pantoprazole (PROTONIX) EC tablet 40 mg  40 mg Oral BID Rankin, Shuvon B, NP   40 mg at 12/11/17 1011  . polyethylene glycol (MIRALAX / GLYCOLAX) packet 17 g  17 g Oral Daily Rankin, Shuvon B, NP       PTA Medications: Medications Prior to Admission  Medication Sig Dispense Refill Last Dose  . busPIRone (BUSPAR) 10 MG tablet Take 10 mg by mouth 3 (three) times daily.  1   . dapagliflozin propanediol (FARXIGA) 10 MG TABS tablet Take 10 mg by mouth daily. (Patient not taking: Reported on 03/31/2017) 30 tablet 5 Not Taking at Unknown time  . Exenatide ER (BYDUREON BCISE) 2 MG/0.85ML AUIJ Inject 2 mg into the skin once a week. 4 pen 5 04/09/2017  . famotidine (PEPCID) 40 MG tablet Take 1 tablet (40 mg total) by mouth at bedtime.     . feeding supplement, GLUCERNA SHAKE, (GLUCERNA SHAKE) LIQD Take 237 mLs by mouth 3 (three) times daily with meals.  0   . FLUoxetine (PROZAC) 40 MG capsule Take 1 capsule (40 mg total) by mouth daily.  3   . insulin aspart (NOVOLOG) 100 UNIT/ML injection Inject 8 Units into the skin 3 (three)  times daily with meals. 10 mL 11   . insulin glargine (LANTUS) 100 UNIT/ML injection Inject 0.25 mLs (25 Units total) into the skin daily. 10 mL 11   . lamoTRIgine (LAMICTAL) 200 MG tablet TAKE 200 MG BY MOUTH ONCE DAILY  1 04/13/2017 at Unknown time  . lithium carbonate (LITHOBID) 300 MG CR tablet Take 3 (900 MG) tablets  by mouth daily  1 04/13/2017 at Unknown time  . metFORMIN (GLUCOPHAGE) 1000 MG tablet Take 1 tablet (1,000 mg total) by mouth 2 (two) times daily with a meal. 180 tablet 1 04/13/2017 at Unknown time  . pantoprazole (PROTONIX) 40 MG tablet Take 1 tablet (40 mg total) by mouth 2 (two) times daily.     . polyethylene glycol (MIRALAX / GLYCOLAX) packet Take 17 g by mouth daily. 14 each 0   . pramipexole (MIRAPEX) 0.25 MG tablet Take 0.75 mg by mouth at bedtime.       Musculoskeletal: Strength & Muscle Tone: within normal limits Gait & Station: normal Patient leans: N/A  Psychiatric Specialty Exam: Physical Exam  Review of Systems  Constitutional: Negative.   HENT: Negative.   Eyes: Negative.   Respiratory: Negative.   Cardiovascular: Negative.   Gastrointestinal: Positive for nausea. Negative for vomiting.  Genitourinary: Negative.   Musculoskeletal: Negative.   Skin: Negative.   Neurological: Negative for seizures and headaches.  Endo/Heme/Allergies: Negative.   Psychiatric/Behavioral: Positive for depression and suicidal ideas.  All other systems reviewed and are negative.   Blood pressure 110/76, pulse 79, temperature 98.1 F (36.7 C), temperature source Oral, resp. rate 18, height 6' (1.829 m), weight 114.8 kg (253 lb).Body mass index is 34.31 kg/m.  General Appearance: Fairly Groomed  Eye Contact:  Good  Speech:  Normal Rate  Volume:  Normal  Mood:  Depressed  Affect:  constricted  Thought Process:  Linear and Descriptions of Associations: Intact  Orientation:  Other:  fully alert and attentive  Thought Content:  no hallucinations, no delusions, not  internally preoccupied   Suicidal Thoughts:  No- denies suicidal ideations, denies self injurious ideations, and contracts for safety  Homicidal Thoughts:  No  Memory:  recent and remote grossly intact  Judgement:  Fair  Insight:  Fair  Psychomotor Activity:  Decreased  Concentration:  Concentration: Fair and Attention Span: Fair  Recall:  [employer] of Knowledge:  Good  Language:  Good  Akathisia:  Negative  Handed:  Right  AIMS (if indicated):     Assets:  Desire for Improvement Resilience  ADL's:  Fair   Cognition:  WNL  Sleep:  Number of Hours: 6.75    Treatment Plan Summary: Daily contact with patient to assess and evaluate symptoms and progress in treatment, Medication management, Plan inpatient treatment and medications as below  Observation Level/Precautions:  15 minute checks  Laboratory: of note 6/27 lithium level 1.05   Psychotherapy:milieu, group therapy     Medications:  Reports he was taking Lamictal , Lithium, Prozac ,Buspar prior to admission,   which were continued during recent medical admission. Continue Prozac 40 mgrs QDAY Continue Buspar 10 mgrs TID He states, however, that he does not feel Lamcital or Lithium were  effective and did not result in noticeable improvement. He expresses interest in changing medications/ current regimen. D/C Li Would taper Lamictal yo 50 mgrs BID initially  He remembers having been on Abilify and states he feels it worked well for him Start Abilify 5 mgrs QDAY  Continue Diabetic management- as per Diabetes Care Nurse Coordinator will increase Novolog to 10 u TID with meals   Consultations:  As needed   Discharge Concerns:  -  Estimated LOS:4-5 days   Other:     Physician Treatment Plan for Primary Diagnosis:  MDD Long Term Goal(s): Improvement in symptoms so as ready for discharge  Short Term Goals: Ability to identify changes in lifestyle  to reduce recurrence of condition will improve and Ability to identify and develop  effective coping behaviors will improve  Physician Treatment Plan for Secondary Diagnosis: Active Problems:   MDD (major depressive disorder), recurrent episode, severe ([place])  Long Term Goal(s): Improvement in symptoms so as ready for discharge  Short Term Goals: Ability to identify changes in lifestyle to reduce recurrence of condition will improve, Ability to verbalize feelings will improve, Ability to disclose and discuss suicidal ideas, Ability to demonstrate self-control will improve, Ability to identify and develop effective coping behaviors will improve and Ability to maintain clinical measurements within normal limits will improve  I certify that inpatient services furnished can reasonably be expected to improve the patient's condition.    Jenne Campus, MD 6/28/20193:18 PM

## 2017-12-11 NOTE — Progress Notes (Signed)
Writer spoke with patient 1:1 and he reports his day being okay. He has been up in the dayroom watching tv and attended group tonight. He reports that he wishes he didn't feel this way but he has lost interest in everything. He reports that he has a girlfriend but has not reached out to her in a while. He is hopeful that his medication change will help improve his mood. He reports his family is supportive but he doesn't care to share with them how he is doing. Support given and safety maintained on unit with 15 min checks.

## 2017-12-12 LAB — GLUCOSE, CAPILLARY
GLUCOSE-CAPILLARY: 142 mg/dL — AB (ref 70–99)
Glucose-Capillary: 170 mg/dL — ABNORMAL HIGH (ref 70–99)
Glucose-Capillary: 198 mg/dL — ABNORMAL HIGH (ref 70–99)
Glucose-Capillary: 244 mg/dL — ABNORMAL HIGH (ref 70–99)

## 2017-12-12 MED ORDER — HALOPERIDOL 5 MG PO TABS
5.0000 mg | ORAL_TABLET | Freq: Once | ORAL | Status: AC
  Administered 2017-12-12: 5 mg via ORAL
  Filled 2017-12-12 (×2): qty 1

## 2017-12-12 NOTE — Progress Notes (Signed)
D. Pt presents with an anxious affect and congruent mood. Pt has been calm and cooperative, observed attending group this am- leaving  group early due to thoughts of self harm. Immediately upon leaving group, pt approached this nurse requesting to talk 1:1. Pt reported that during am group he was having thoughts of self harm triggered by seeing a pencil. Pt states, "I had thoughts of stabbing myself in the stomach or temple". Spoke with pt 1:1 offering support and reassuring him that he was safe. Pt verbally agrees to contact staff again if self harm thoughts return. After this nurse spoke with pt, pt returned to his bedroom to lay down, stating that, "sometimes my brain needs to reset". A. Labs and vitals monitored. Pt compliant with medications. Pt supported emotionally and encouraged to express concerns and ask questions.   R. Pt remains safe with 15 minute checks. Will continue POC.

## 2017-12-12 NOTE — Progress Notes (Addendum)
[facility] MD Progress Note  12/12/2017 1:43 PM Marc Long  MRN:  [id number] Subjective: Describes ongoing depression, passive thoughts of death/dying, but denies plan or intention of hurting himself/contracts for safety on unit.  Denies medication side effects. Objective : I have reviewed chart notes and have met with patient.  42 year old single male who lives with a roommate. Admitted to inpatient medical units with DKA on June 22.  States this was intentional, by stopping his diabetic management and care as a way of dying.  Describes chronic depression, worsening over recent months. Today patient reports ongoing depression, endorses passive SI, but denies any plan or intention of hurting himself or of suicide at this time and is able to contract for safety.  He states he has had suicidal fantasies intermittently for months. He has been going to some groups,  is spending time in dayroom, and is becoming more interactive with peers, behavior on unit in good control, calm, polite on approach .  Patient reports he feels mildly  cognitively slowed following recent DKA episode.  States this is improving gradually.  Of note presents fully alert and attentive and is oriented x3.  Currently on Abilify, BuSpar, Prozac which was recently titrated, Lamictal-denies side effects  Principal Problem: MDD, Suicidal Ideations Diagnosis:   Patient Active Problem List   Diagnosis Date Noted  . MDD (major depressive disorder), recurrent episode, severe ([place]) [F33.2] 12/10/2017  . Obesity (BMI 30-39.9) [E66.9] 12/09/2017  . Depression with suicidal ideation [F32.9, R45.851]   . DKA (diabetic ketoacidoses) ([place]) [E13.10] 12/05/2017  . Hypertriglyceridemia [E78.1] 02/05/2017  . DM (diabetes mellitus), type 2, uncontrolled ([place]) [E11.65] 12/12/2016  . Dehydration [E86.0] 12/12/2016  . Suicidal ideations [R45.851] 12/12/2016  . Bipolar disorder (manic depression) ([place]) [F31.9] 12/12/2016  . Hyperglycemia due to type 2  diabetes mellitus ([place]) [E11.65] 12/12/2016  . AKI (acute kidney injury) ([place]) [N17.9] 12/12/2016  . DKA, type 2, not at goal [facility]) [E11.10] 12/12/2016  . Obesity, Class III, BMI 40-49.9 (morbid obesity) ([place]) [E66.01]   . Type 2 diabetes mellitus without complication, without long-term current use of insulin ([place]) [E11.9] 12/10/2016  . Lithium use [Z79.899] 12/10/2016  . Moderate single current episode of major depressive disorder ([place]) [F32.1] 04/15/2016  . Pain in joint, shoulder region [M25.519] 04/14/2016  . Diverticulosis of large intestine without hemorrhage [K57.30] 11/07/2015  . Calculus of gallbladder without cholecystitis [K80.20] 11/07/2015   Total Time spent with patient: 20 minutes  Past Psychiatric History:   Past Medical History:  Past Medical History:  Diagnosis Date  . Depression   . Diabetes mellitus without complication ([place])   . Gallstones   . Obesity, Class III, BMI 40-49.9 (morbid obesity) ([place])    History reviewed. No pertinent surgical history. Family History:  Family History  Problem Relation Age of Onset  . Hypertension Mother   . Depression Mother   . Heart attack Maternal Grandfather   . Lymphoma Maternal Grandmother   . Diabetes Maternal Aunt   . Cancer Maternal Aunt    Family Psychiatric  History:  Social History:  Social History   Substance and Sexual Activity  Alcohol Use Yes  . Alcohol/week: 0.0 oz   Comment: once every 2 weeks. Wine coolers     Social History   Substance and Sexual Activity  Drug Use No    Social History   Socioeconomic History  . Marital status: Single    Spouse name: Not on file  . Number of children: 0  . Years of  education: Not on file  . Highest education level: Not on file  Occupational History  . Occupation: call center  Social Needs  . Financial resource strain: Not on file  . Food insecurity:    Worry: Not on file    Inability: Not on file  . Transportation needs:    Medical: Not on file     Non-medical: Not on file  Tobacco Use  . Smoking status: Never Smoker  . Smokeless tobacco: Never Used  Substance and Sexual Activity  . Alcohol use: Yes    Alcohol/week: 0.0 oz    Comment: once every 2 weeks. Wine coolers  . Drug use: No  . Sexual activity: Not Currently    Birth control/protection: None  Lifestyle  . Physical activity:    Days per week: Not on file    Minutes per session: Not on file  . Stress: Not on file  Relationships  . Social connections:    Talks on phone: Not on file    Gets together: Not on file    Attends religious service: Not on file    Active member of club or organization: Not on file    Attends meetings of clubs or organizations: Not on file    Relationship status: Not on file  Other Topics Concern  . Not on file  Social History Narrative  . Not on file   Additional Social History:                         Sleep: Fair  Appetite:  Fair  Current Medications: Current Facility-Administered Medications  Medication Dose Route Frequency Provider Last Rate Last Dose  . ARIPiprazole (ABILIFY) tablet 5 mg  5 mg Oral Daily Cobos, Myer Peer, MD   5 mg at 12/12/17 0814  . busPIRone (BUSPAR) tablet 10 mg  10 mg Oral TID Rankin, Shuvon B, NP   10 mg at 12/12/17 1213  . FLUoxetine (PROZAC) capsule 40 mg  40 mg Oral Daily Rankin, Shuvon B, NP   40 mg at 12/12/17 0814  . insulin aspart (novoLOG) injection 10 Units  10 Units Subcutaneous TID WC Cobos, Myer Peer, MD   10 Units at 12/12/17 1216  . insulin glargine (LANTUS) injection 25 Units  25 Units Subcutaneous Daily Rankin, Shuvon B, NP   25 Units at 12/12/17 0813  . lamoTRIgine (LAMICTAL) tablet 50 mg  50 mg Oral BID Cobos, Myer Peer, MD   50 mg at 12/12/17 0814  . metFORMIN (GLUCOPHAGE) tablet 1,000 mg  1,000 mg Oral BID WC Rankin, Shuvon B, NP   1,000 mg at 12/12/17 0814  . pantoprazole (PROTONIX) EC tablet 40 mg  40 mg Oral BID Rankin, Shuvon B, NP   40 mg at 12/12/17 0814  . polyethylene  glycol (MIRALAX / GLYCOLAX) packet 17 g  17 g Oral Daily Rankin, Shuvon B, NP        Lab Results:  Results for orders placed or performed during the hospital encounter of 12/10/17 (from the past 48 hour(s))  Glucose, capillary     Status: Abnormal   Collection Time: 12/10/17  5:29 PM  Result Value Ref Range   Glucose-Capillary 210 (H) 70 - 99 mg/dL  Lithium level     Status: None   Collection Time: 12/10/17  6:22 PM  Result Value Ref Range   Lithium Lvl 1.05 0.60 - 1.20 mmol/L    Comment: Performed at [facility], [address] [place], [place] [address]  Glucose, capillary     Status: Abnormal   Collection Time: 12/10/17 11:35 PM  Result Value Ref Range   Glucose-Capillary 321 (H) 70 - 99 mg/dL  Glucose, capillary     Status: Abnormal   Collection Time: 12/11/17  6:17 AM  Result Value Ref Range   Glucose-Capillary 231 (H) 70 - 99 mg/dL  Glucose, capillary     Status: Abnormal   Collection Time: 12/11/17 12:04 PM  Result Value Ref Range   Glucose-Capillary 336 (H) 70 - 99 mg/dL  Glucose, capillary     Status: Abnormal   Collection Time: 12/11/17  4:59 PM  Result Value Ref Range   Glucose-Capillary 202 (H) 70 - 99 mg/dL   Comment 1 Notify RN    Comment 2 Document in Chart   Glucose, capillary     Status: Abnormal   Collection Time: 12/11/17  8:42 PM  Result Value Ref Range   Glucose-Capillary 118 (H) 70 - 99 mg/dL   Comment 1 Notify RN   Glucose, capillary     Status: Abnormal   Collection Time: 12/12/17  6:09 AM  Result Value Ref Range   Glucose-Capillary 170 (H) 70 - 99 mg/dL  Glucose, capillary     Status: Abnormal   Collection Time: 12/12/17 12:03 PM  Result Value Ref Range   Glucose-Capillary 198 (H) 70 - 99 mg/dL    Blood Alcohol level:  Lab Results  Component Value Date   ETH <10 70/78/6754    Metabolic Disorder Labs: Lab Results  Component Value Date   HGBA1C 16.7 (H) 12/09/2017   MPG 432.59 12/09/2017   No results found for:  PROLACTIN Lab Results  Component Value Date   CHOL 142 03/05/2017   TRIG 148 03/05/2017   HDL 27 (L) 03/05/2017   CHOLHDL 5.3 (H) 03/05/2017   LDLCALC 85 03/05/2017   LDLCALC Comment 12/10/2016    Physical Findings: AIMS: Facial and Oral Movements Muscles of Facial Expression: None, normal Lips and Perioral Area: None, normal Jaw: None, normal Tongue: None, normal,Extremity Movements Upper (arms, wrists, hands, fingers): None, normal Lower (legs, knees, ankles, toes): None, normal, Trunk Movements Neck, shoulders, hips: None, normal, Overall Severity Severity of abnormal movements (highest score from questions above): None, normal Incapacitation due to abnormal movements: None, normal Patient's awareness of abnormal movements (rate only patient's report): No Awareness, Dental Status Current problems with teeth and/or dentures?: No Does patient usually wear dentures?: No  CIWA:    COWS:     Musculoskeletal: Strength & Muscle Tone: within normal limits Gait & Station: normal Patient leans: N/A  Psychiatric Specialty Exam: Physical Exam  ROS-denies headache, no chest pain, no shortness of breath, no vomiting  Blood pressure 116/89, pulse 94, temperature 98 F (36.7 C), temperature source Oral, resp. rate 16, height 6' (1.829 m), weight 114.8 kg (253 lb).Body mass index is 34.31 kg/m.  General Appearance: Fairly Groomed  Eye Contact:  Good  Speech:  Slow  Volume:  Normal  Mood:  Depressed  Affect:  Remains constricted, smiles briefly at times appropriately  Thought Process:  Linear and Descriptions of Associations: Intact  Orientation:  Full (Time, Place, and Person)  Thought Content:  No hallucinations, no delusions, not internally preoccupied  Suicidal Thoughts:  Yes.  without intent/plan-endorses passive thoughts of death but denies current plan or intention of hurting himself or of suicide.  Reports chronic/intermittent suicidal ideations over a period of months   Homicidal Thoughts:  No  Memory:  Recent and remote grossly intact  Judgement:  Fair  Insight:  Fair  Psychomotor Activity:  Decreased  Concentration:  Concentration: Good and Attention Span: Good  Recall:  Good  Fund of Knowledge:  Good  Language:  Good  Akathisia:  Negative  Handed:  Right  AIMS (if indicated):     Assets:  Desire for Improvement Resilience  ADL's:  Intact  Cognition:  WNL  Sleep:  Number of Hours: 6.75   Assessment-42 year old single male, admitted to medical unit in DKA on June 22.  History of chronic depression, reports she had stopped caring for his diabetes as a way of dying.  Presents with slight improvement but  continues to report depression and passive suicidal ideations. He does contract for safety and does not currently endorse plan or intention.  Although remains constricted in affect, is noted to be more interactive with peers today, spending time in day room, and smiles at times appropriately during the session.  At this time is on Abilify, BuSpar, Prozac, Lamictal-denies side effects.  Patient reports prior diagnosis of bipolar disorder but denies any clear history of hypomania or mania.  Had been managed with lithium prior to admission which he reported was not working and wanted to come off of.  Treatment Plan Summary: Daily contact with patient to assess and evaluate symptoms and progress in treatment, Medication management, Plan Inpatient treatment and Medications as below Encourage group and milieu participation to work on coping skills and symptom reduction Continue Prozac 40 mg daily for depression. Continue Abilify 5 mg daily for antidepressant augmentation/mood disorder Continue Lamictal 50 mg twice daily for mood disorder( patient reports he does not feel this medication has worked for him and it is being tapered down gradually) Continue BuSpar 10 mg 3 times a day for anxiety Continue diabetes management( CBG today 170) . Jenne Campus,  MD 12/12/2017, 1:43 PM

## 2017-12-12 NOTE — Progress Notes (Signed)
Patient observed interacting well in the dayroom with peers this afternoon, attending groups and going to the gym with group for recreational therapy. Per pt's self inventory, pt rates his depression, hopelessness and anxiety a 6/8/3, respectively.

## 2017-12-12 NOTE — Progress Notes (Signed)
Inpatient Diabetes Program Recommendations  AACE/ADA: New Consensus Statement on Inpatient Glycemic Control (2015)  Target Ranges:  Prepandial:   less than 140 mg/dL      Peak postprandial:   less than 180 mg/dL (1-2 hours)      Critically ill patients:  140 - 180 mg/dL   Lab Results  Component Value Date   GLUCAP 170 (H) 12/12/2017   HGBA1C 16.7 (H) 12/09/2017    Review of Glycemic Control  CBGs much improved. Needs correction insulin.  Inpatient Diabetes Program Recommendations:    Add Novolog 0-9 units tidwc and hs  Continue to follow.   Thank you. Lorenda Peck, RD, LDN, CDE Inpatient Diabetes Coordinator [phone]

## 2017-12-12 NOTE — Progress Notes (Signed)
Adult Psychoeducational Group Note  Date:  12/12/2017 Time:  10:52 AM  Group Topic/Focus:  Goals Group:   The focus of this group is to help patients establish daily goals to achieve during treatment and discuss how the patient can incorporate goal setting into their daily lives to aide in recovery.  Participation Level:  Minimal  Participation Quality:  Appropriate, Attentive and Sharing  Affect:  Depressed, Flat and Lethargic  Cognitive:  Appropriate  Insight: Appropriate  Engagement in Group:  Limited  Modes of Intervention:  Activity, Clarification, Discussion, Education and Support  Additional Comments:  Pt completed the self-inventory and attended the goals/orientation group.  Pt listened attentively to his peers' shares.  Pt's goal is "to not feel hopeless".  Pt stated that "nothing is important".  Staff and group assisted pt in exploring ways to overcome hopeless. On pt's inventory, pt admitted to having thoughts of suicide.  Later, this staff checked in with pt, and he told this staff that he "saw his death".  When asked to explain what that meant, pt stated that, "I see myself stabbing myself in my throat with a knife."  Pt was acknowledged for his honesty and for attending the group and not isolating.  Carolyne Littles F  MHT/LRT/CTRS 12/12/2017, 10:52 AM

## 2017-12-12 NOTE — Progress Notes (Signed)
Pt's mother called Hassan Rowan) inquiring about pt's condition. Mother requests to speak with Dr about " a few concerns". She can be reached at [phone]

## 2017-12-12 NOTE — Progress Notes (Signed)
Patient requested to speak with Probation officer. He informed Probation officer that he has been having visualizations of him killing himself. Writer asked how long this has been going on and he reported most of the day. He denies hearing voices and contracts for safety . Writer asked that he remain in the dayroom with peers and not isolate to his room. Writer received a 1 time order for haldol 5 mg which he received. Safety maintained on unit with 15 min checks. Will monitor effectiveness of medication.

## 2017-12-12 NOTE — [facility] Group Notes (Signed)
LCSW Group Therapy Note  12/12/2017   10:00--11:00am   Type of Therapy and Topic:  Group Therapy: Anger Cues and Responses  Participation Level:  Active   Description of Group:   In this group, patients learned how to recognize the physical, cognitive, emotional, and behavioral responses they have to anger-provoking situations.  They identified a recent time they became angry and how they reacted.  They analyzed how their reaction was possibly beneficial and how it was possibly unhelpful.  The group discussed a variety of healthier coping skills that could help with such a situation in the future.  Deep breathing was practiced briefly.  Therapeutic Goals: 1. Patients will remember their last incident of anger and how they felt emotionally and physically, what their thoughts were at the time, and how they behaved. 2. Patients will identify how their behavior at that time worked for them, as well as how it worked against them. 3. Patients will explore possible new behaviors to use in future anger situations. 4. Patients will learn that anger itself is normal and cannot be eliminated, and that healthier reactions can assist with resolving conflict rather than worsening situations.  Summary of Patient Progress:  The patient shared that his most recent time of anger was prior to coming to inpatient and it was because he not die after he discontinued taking diabetes medication. The patient expressed anger and feelings of despair and thinking at the time that death was the answer to his problems. He stated that he is now willing to consider other options besides death. The patient describes suppressing anger as his main way of coping with anger. He recognizes that this is not healthy and is open to developing healthier means of managing his anger.  Therapeutic Modalities:   Cognitive Behavioral Therapy  Elisabeth Pigeon, LCSW  Rolanda Jay

## 2017-12-13 LAB — GLUCOSE, CAPILLARY
GLUCOSE-CAPILLARY: 163 mg/dL — AB (ref 70–99)
GLUCOSE-CAPILLARY: 168 mg/dL — AB (ref 70–99)
GLUCOSE-CAPILLARY: 126 mg/dL — AB (ref 70–99)
Glucose-Capillary: 154 mg/dL — ABNORMAL HIGH (ref 70–99)

## 2017-12-13 MED ORDER — LAMOTRIGINE 25 MG PO TABS
50.0000 mg | ORAL_TABLET | Freq: Every day | ORAL | Status: DC
  Administered 2017-12-14: 50 mg via ORAL
  Filled 2017-12-13 (×2): qty 2

## 2017-12-13 MED ORDER — LAMOTRIGINE 25 MG PO TABS
25.0000 mg | ORAL_TABLET | ORAL | Status: DC
  Administered 2017-12-14 – 2017-12-15 (×2): 25 mg via ORAL
  Filled 2017-12-13 (×4): qty 1

## 2017-12-13 MED ORDER — ARIPIPRAZOLE 10 MG PO TABS
10.0000 mg | ORAL_TABLET | Freq: Every day | ORAL | Status: DC
  Administered 2017-12-14: 10 mg via ORAL
  Filled 2017-12-13 (×2): qty 1

## 2017-12-13 NOTE — [facility] Group Notes (Signed)
Adult Psychoeducational Group Note  Date:  12/13/2017 Time:  4:31 AM  Group Topic/Focus:  Wrap-Up Group:   The focus of this group is to help patients review their daily goal of treatment and discuss progress on daily workbooks.  Participation Level:  Active  Participation Quality:  Appropriate  Affect:  Appropriate  Cognitive:  Appropriate  Insight: Improving  Engagement in Group:  Developing/Improving  Modes of Intervention:  Discussion  Additional Comments:  Patient attended group and shared that one way he copes with anger is by listening to music that fuels his anger until he calms down, then begins to listen to music that is more relaxed.   Noel Christmas 12/13/2017, 4:31 AM

## 2017-12-13 NOTE — Progress Notes (Signed)
Writer has observed patient up in the dayroom laughing, talking and playing cards with peers Writer spoke with patient 1:1 and he reports that today he still had visual images but was able to cope. He did report that he experienced tingling, numbness and feeling jifttery today and reported to day shift nurse. Writer informed him about his medication change that will take place in the am that was made by his doctor. Patient contracts for safety and remained in dayroom until he was ready to go to bed. Support given and safety maintained on unit with 15 min checks.

## 2017-12-13 NOTE — Progress Notes (Signed)
[facility] MD Progress Note  12/13/2017 4:04 PM Marc Long  MRN:  [id number] Subjective: He continues to report depression, intermittent thoughts which she describes as visual images in his mind regarding suicide, states he has had these for several months.  Currently denies suicidal plan or intention and does contract for safety on unit.  Does not endorse medication side effects. Objective : I have reviewed chart notes and have met with patient.  42 year old single male who lives with a roommate. Admitted to inpatient medical units with DKA on June 22.  States this was intentional, by stopping his diabetic management and care as a way of dying.  Describes chronic depression, worsening over recent months.Patient describes a prior history of Bipolar Disorder diagnoses, but at present does not endorse any clear history of hypomania or mania, does describe chronic depression which he states is intermittent. Patient continues to report depression and suicidal ruminations as above, although without plan or intent at this time and is able to contract for safety on unit.  His affect remains constricted but more reactive and he smiles briefly at times appropriately, he is also participating more in groups and noted to be interactive with peers. Currently denies medication side effects.  Today we also spoke about the potential option of ECT as a treatment modality for chronic or resistant severe depression .  He expressed interest in this option if considered appropriate. Principal Problem: MDD, Suicidal Ideations Diagnosis:   Patient Active Problem List   Diagnosis Date Noted  . MDD (major depressive disorder), recurrent episode, severe ([place]) [F33.2] 12/10/2017  . Obesity (BMI 30-39.9) [E66.9] 12/09/2017  . Depression with suicidal ideation [F32.9, R45.851]   . DKA (diabetic ketoacidoses) ([place]) [E13.10] 12/05/2017  . Hypertriglyceridemia [E78.1] 02/05/2017  . DM (diabetes mellitus), type 2, uncontrolled ([place])  [E11.65] 12/12/2016  . Dehydration [E86.0] 12/12/2016  . Suicidal ideations [R45.851] 12/12/2016  . Bipolar disorder (manic depression) ([place]) [F31.9] 12/12/2016  . Hyperglycemia due to type 2 diabetes mellitus ([place]) [E11.65] 12/12/2016  . AKI (acute kidney injury) ([place]) [N17.9] 12/12/2016  . DKA, type 2, not at goal [facility]) [E11.10] 12/12/2016  . Obesity, Class III, BMI 40-49.9 (morbid obesity) ([place]) [E66.01]   . Type 2 diabetes mellitus without complication, without long-term current use of insulin ([place]) [E11.9] 12/10/2016  . Lithium use [Z79.899] 12/10/2016  . Moderate single current episode of major depressive disorder ([place]) [F32.1] 04/15/2016  . Pain in joint, shoulder region [M25.519] 04/14/2016  . Diverticulosis of large intestine without hemorrhage [K57.30] 11/07/2015  . Calculus of gallbladder without cholecystitis [K80.20] 11/07/2015   Total Time spent with patient: 20 minutes  Past Psychiatric History:   Past Medical History:  Past Medical History:  Diagnosis Date  . Depression   . Diabetes mellitus without complication ([place])   . Gallstones   . Obesity, Class III, BMI 40-49.9 (morbid obesity) ([place])    History reviewed. No pertinent surgical history. Family History:  Family History  Problem Relation Age of Onset  . Hypertension Mother   . Depression Mother   . Heart attack Maternal Grandfather   . Lymphoma Maternal Grandmother   . Diabetes Maternal Aunt   . Cancer Maternal Aunt    Family Psychiatric  History:  Social History:  Social History   Substance and Sexual Activity  Alcohol Use Yes  . Alcohol/week: 0.0 oz   Comment: once every 2 weeks. Wine coolers     Social History   Substance and Sexual Activity  Drug Use No  Social History   Socioeconomic History  . Marital status: Single    Spouse name: Not on file  . Number of children: 0  . Years of education: Not on file  . Highest education level: Not on file  Occupational History  . Occupation:  call center  Social Needs  . Financial resource strain: Not on file  . Food insecurity:    Worry: Not on file    Inability: Not on file  . Transportation needs:    Medical: Not on file    Non-medical: Not on file  Tobacco Use  . Smoking status: Never Smoker  . Smokeless tobacco: Never Used  Substance and Sexual Activity  . Alcohol use: Yes    Alcohol/week: 0.0 oz    Comment: once every 2 weeks. Wine coolers  . Drug use: No  . Sexual activity: Not Currently    Birth control/protection: None  Lifestyle  . Physical activity:    Days per week: Not on file    Minutes per session: Not on file  . Stress: Not on file  Relationships  . Social connections:    Talks on phone: Not on file    Gets together: Not on file    Attends religious service: Not on file    Active member of club or organization: Not on file    Attends meetings of clubs or organizations: Not on file    Relationship status: Not on file  Other Topics Concern  . Not on file  Social History Narrative  . Not on file   Additional Social History:   Sleep: Improving  Appetite:  Fair  Current Medications: Current Facility-Administered Medications  Medication Dose Route Frequency Provider Last Rate Last Dose  . ARIPiprazole (ABILIFY) tablet 5 mg  5 mg Oral Daily Damarcus Reggio, Myer Peer, MD   5 mg at 12/13/17 0749  . busPIRone (BUSPAR) tablet 10 mg  10 mg Oral TID Rankin, Shuvon B, NP   10 mg at 12/13/17 1208  . FLUoxetine (PROZAC) capsule 40 mg  40 mg Oral Daily Rankin, Shuvon B, NP   40 mg at 12/13/17 0748  . insulin aspart (novoLOG) injection 10 Units  10 Units Subcutaneous TID WC Natina Wiginton, Myer Peer, MD   10 Units at 12/13/17 1208  . insulin glargine (LANTUS) injection 25 Units  25 Units Subcutaneous Daily Rankin, Shuvon B, NP   25 Units at 12/13/17 0746  . lamoTRIgine (LAMICTAL) tablet 50 mg  50 mg Oral BID Maurine Mowbray, Myer Peer, MD   50 mg at 12/13/17 0749  . metFORMIN (GLUCOPHAGE) tablet 1,000 mg  1,000 mg Oral BID WC  Rankin, Shuvon B, NP   1,000 mg at 12/13/17 0749  . pantoprazole (PROTONIX) EC tablet 40 mg  40 mg Oral BID Rankin, Shuvon B, NP   40 mg at 12/13/17 0749  . polyethylene glycol (MIRALAX / GLYCOLAX) packet 17 g  17 g Oral Daily Rankin, Shuvon B, NP        Lab Results:  Results for orders placed or performed during the hospital encounter of 12/10/17 (from the past 48 hour(s))  Glucose, capillary     Status: Abnormal   Collection Time: 12/11/17  4:59 PM  Result Value Ref Range   Glucose-Capillary 202 (H) 70 - 99 mg/dL   Comment 1 Notify RN    Comment 2 Document in Chart   Glucose, capillary     Status: Abnormal   Collection Time: 12/11/17  8:42 PM  Result Value Ref Range  Glucose-Capillary 118 (H) 70 - 99 mg/dL   Comment 1 Notify RN   Glucose, capillary     Status: Abnormal   Collection Time: 12/12/17  6:09 AM  Result Value Ref Range   Glucose-Capillary 170 (H) 70 - 99 mg/dL  Glucose, capillary     Status: Abnormal   Collection Time: 12/12/17 12:03 PM  Result Value Ref Range   Glucose-Capillary 198 (H) 70 - 99 mg/dL  Glucose, capillary     Status: Abnormal   Collection Time: 12/12/17  4:59 PM  Result Value Ref Range   Glucose-Capillary 244 (H) 70 - 99 mg/dL  Glucose, capillary     Status: Abnormal   Collection Time: 12/12/17  8:49 PM  Result Value Ref Range   Glucose-Capillary 142 (H) 70 - 99 mg/dL  Glucose, capillary     Status: Abnormal   Collection Time: 12/13/17  6:09 AM  Result Value Ref Range   Glucose-Capillary 163 (H) 70 - 99 mg/dL   Comment 1 Notify RN    Comment 2 Document in Chart   Glucose, capillary     Status: Abnormal   Collection Time: 12/13/17 11:51 AM  Result Value Ref Range   Glucose-Capillary 168 (H) 70 - 99 mg/dL    Blood Alcohol level:  Lab Results  Component Value Date   ETH <10 51/70/0174    Metabolic Disorder Labs: Lab Results  Component Value Date   HGBA1C 16.7 (H) 12/09/2017   MPG 432.59 12/09/2017   No results found for:  PROLACTIN Lab Results  Component Value Date   CHOL 142 03/05/2017   TRIG 148 03/05/2017   HDL 27 (L) 03/05/2017   CHOLHDL 5.3 (H) 03/05/2017   LDLCALC 85 03/05/2017   LDLCALC Comment 12/10/2016    Physical Findings: AIMS: Facial and Oral Movements Muscles of Facial Expression: None, normal Lips and Perioral Area: None, normal Jaw: None, normal Tongue: None, normal,Extremity Movements Upper (arms, wrists, hands, fingers): None, normal Lower (legs, knees, ankles, toes): None, normal, Trunk Movements Neck, shoulders, hips: None, normal, Overall Severity Severity of abnormal movements (highest score from questions above): None, normal Incapacitation due to abnormal movements: None, normal Patient's awareness of abnormal movements (rate only patient's report): No Awareness, Dental Status Current problems with teeth and/or dentures?: No Does patient usually wear dentures?: No  CIWA:    COWS:     Musculoskeletal: Strength & Muscle Tone: within normal limits Gait & Station: normal Patient leans: N/A  Psychiatric Specialty Exam: Physical Exam  ROS-denies headache, no chest pain, no shortness of breath, no vomiting  Blood pressure 118/68, pulse (!) 104, temperature 98 F (36.7 C), temperature source Oral, resp. rate 20, height 6' (1.829 m), weight 114.8 kg (253 lb).Body mass index is 34.31 kg/m.  General Appearance: Fairly Groomed  Eye Contact:  Good  Speech:  Normal Rate  Volume:  Normal  Mood:  Remains depressed  Affect:  Constricted, vaguely anxious,  more reactive, smiles briefly at times  Thought Process:  Linear and Descriptions of Associations: Intact  Orientation:  Full (Time, Place, and Person)  Thought Content:  No hallucinations, no delusions, not internally preoccupied  Suicidal Thoughts:  Yes.  without intent/plan-reports having visual images of death or of hurting himself, but denies any current plan or intention and contracts for safety on unit  Homicidal  Thoughts:  No  Memory:  Recent and remote grossly intact  Judgement: Improving  Insight:  Fair  Psychomotor Activity:  Decreased, but has been visible on unit and  going to groups  Concentration:  Concentration: Good and Attention Span: Good  Recall:  Good  Fund of Knowledge:  Good  Language:  Good  Akathisia:  Negative  Handed:  Right  AIMS (if indicated):     Assets:  Desire for Improvement Resilience  ADL's:  Intact  Cognition:  WNL  Sleep:  Number of Hours: 6.75   Assessment-42 year old single male, admitted to medical unit in DKA on June 22.  History of chronic depression, reports she had stopped caring for his diabetes as a way of dying.  Patient remains depressed and endorses having  passive suicidal thoughts/visual images of his death.  Denies suicidal plan or intention and contracts for safety on unit.  Affect partially improved and tends to brighten partially upon approach or when interacting with peers. Denies medication side effects.  Expresses interest in ECT as a treatment option if considered appropriate.  Treatment Plan Summary: Daily contact with patient to assess and evaluate symptoms and progress in treatment, Medication management, Plan Inpatient treatment and Medications as below  Treatment plan reviewed as below today June 30 Encourage group and milieu participation to work on Radiographer, therapeutic and symptom reduction Continue Prozac 40 mg daily for depression. Increase Abilify to 10 mg daily for mood disorder Decrease Lamictal to 25 mgrs QAM and 50 mgrs QHS-patient reports he feels this medication no longer helpful and interested in tapering off  Continue BuSpar 10 mg 3 times a day for anxiety Continue diabetes management( CBG [id number] ) . Consider ECT consultation and referral , as patient expresses interest in this treatment option, particularly if depression does not improve significantly with medication regimen. Jenne Campus, MD 12/13/2017, 4:04 PM    Patient ID: [name], male   DOB: 23-Sep-1975, 42 y.o.   MRN: [id number]

## 2017-12-13 NOTE — [facility] Group Notes (Signed)
[place] [facility] Group Notes:  (Nursing)  Date:  12/13/2017  Time:  1:30 PM  Type of Therapy:  Nurse Education  Participation Level:  Active  Participation Quality:  Appropriate  Affect:  Appropriate  Cognitive:  Appropriate  Insight:  Appropriate  Engagement in Group:  Engaged  Modes of Intervention:  Discussion and Education  Summary of Progress/Problems: Group played a non competitive Pharmacist, hospital game that fosters listening skills as well as self expression  Waymond Cera 12/13/2017, 3:45 PM

## 2017-12-13 NOTE — [facility] Group Notes (Signed)
[facility] LCSW Group Therapy Note  Date/Time:  12/13/2017 10:00-11:00AM  Type of Therapy and Topic:  Group Therapy:  Healthy and Unhealthy Supports  Participation Level:  Active   Description of Group:  Patients in this group were introduced to the idea of adding a variety of healthy supports to address the various needs in their lives.Patients discussed what additional healthy supports could be helpful in their recovery and wellness after discharge in order to prevent future hospitalizations.   An emphasis was placed on using counselor, doctor, therapy groups, 12-step groups, and problem-specific support groups to expand supports.  They also worked as a group on developing a specific plan for several patients to deal with unhealthy supports through [place], psychoeducation with loved ones, and even termination of relationships.   Therapeutic Goals:   1)  discuss importance of adding supports to stay well once out of the hospital  2)  compare healthy versus unhealthy supports and identify some examples of each  3)  generate ideas and descriptions of healthy supports that can be added  4)  offer mutual support about how to address unhealthy supports  5)  encourage active participation in and adherence to discharge plan    Summary of Patient Progress:  The patient stated that current healthy supports in his mother and his brother. Patient is able to articulate the importance of having healthy supports in their life to help in the recovery process. Patient is able to discern healthy supports from unhealthy ones.    Therapeutic Modalities:   Motivational Interviewing Brief Solution-Focused Therapy  Elisabeth Pigeon, LCSW  Rolanda Jay

## 2017-12-13 NOTE — Progress Notes (Signed)
Patient observed interacting appropriately with peers, playing cards in the dayroom - Pt reports that he has had 'shakiness' in his hands and a "pins and needles feeling" all over his body at various times throughout the day. VS and blood sugar WNL. Dr notified.

## 2017-12-13 NOTE — Progress Notes (Signed)
D. Pt presents with an anxious affect/mood, but brightens a little upon interactions. Pt observed sitting in the dayroom interacting well with peers- pt approached this nurse this am endorsing 'thoughts of self harm'- reporting that he saw himself "with pencils in his eyes", but reports that he has no intention of following through and agrees to talk with staff when self harm thoughts arise. Pt writes that his most important goal today is "not seeing my death". Per pt's self inventory, pt rates his depression, hopelessness and anxiety an 8/7/7, respectively.  A. Labs and vitals monitored. Pt compliant with medications. Pt supported emotionally and encouraged to express concerns and ask questions.   R. Pt remains safe with 15 minute checks. Will continue POC.

## 2017-12-14 DIAGNOSIS — R45851 Suicidal ideations: Secondary | ICD-10-CM

## 2017-12-14 DIAGNOSIS — R441 Visual hallucinations: Secondary | ICD-10-CM

## 2017-12-14 DIAGNOSIS — F333 Major depressive disorder, recurrent, severe with psychotic symptoms: Secondary | ICD-10-CM

## 2017-12-14 LAB — GLUCOSE, CAPILLARY
GLUCOSE-CAPILLARY: 130 mg/dL — AB (ref 70–99)
GLUCOSE-CAPILLARY: 134 mg/dL — AB (ref 70–99)
GLUCOSE-CAPILLARY: 181 mg/dL — AB (ref 70–99)
Glucose-Capillary: 90 mg/dL (ref 70–99)

## 2017-12-14 MED ORDER — RISPERIDONE 1 MG PO TABS
1.0000 mg | ORAL_TABLET | Freq: Every day | ORAL | Status: DC
  Administered 2017-12-14: 1 mg via ORAL
  Filled 2017-12-14 (×2): qty 1

## 2017-12-14 MED ORDER — RISPERIDONE 0.5 MG PO TABS
0.5000 mg | ORAL_TABLET | Freq: Once | ORAL | Status: AC
Start: 2017-12-14 — End: 2017-12-14
  Administered 2017-12-14: 0.5 mg via ORAL
  Filled 2017-12-14 (×2): qty 1

## 2017-12-14 MED ORDER — HYDROCORTISONE 1 % EX CREA
TOPICAL_CREAM | Freq: Two times a day (BID) | CUTANEOUS | Status: DC | PRN
  Administered 2017-12-14: 1 via TOPICAL
  Filled 2017-12-14: qty 28

## 2017-12-14 MED ORDER — DIPHENHYDRAMINE HCL 25 MG PO CAPS
25.0000 mg | ORAL_CAPSULE | Freq: Once | ORAL | Status: AC
  Administered 2017-12-14: 25 mg via ORAL
  Filled 2017-12-14 (×2): qty 1

## 2017-12-14 NOTE — Progress Notes (Signed)
[facility] MD Progress Note  12/14/2017 11:09 AM Marc Long  MRN:  [id number] Subjective: Patient is seen and examined.  Patient is a 42 year old male with a past psychiatric history significant for major depression, recurrent, severe.  He was admitted after intentionally stopping his insulin dosage for diabetes, developing DKA.  He was admitted and placed on BuSpar, fluoxetine, Lamictal, and Abilify.  He reported earlier in hospitalization to having some paresthesias in his hands and feet.  He stated that those have gotten worse.  They were not present necessarily prior to the Abilify.  He had been on other antipsychotics for his visual hallucinations previously, but that led to weight gain.  He stated his mood was somewhat improved, but but the plan had been without improvement that there was consideration for possible ECT.  His vital signs are stable.  His blood sugar this morning was 130.  He denied suicidal ideation, but admitted to visual hallucinations.  These sound more delusionary.  He talked about seeing himself take a knife and stab himself in the throat, and also sees himself with a pencil stabbing in his eye.  He agreed to let us know if he was having these problems during the course of the hospitalization. Principal Problem: <principal problem not specified> Diagnosis:   Patient Active Problem List   Diagnosis Date Noted  . MDD (major depressive disorder), recurrent episode, severe ([place]) [F33.2] 12/10/2017  . Obesity (BMI 30-39.9) [E66.9] 12/09/2017  . Depression with suicidal ideation [F32.9, R45.851]   . DKA (diabetic ketoacidoses) ([place]) [E13.10] 12/05/2017  . Hypertriglyceridemia [E78.1] 02/05/2017  . DM (diabetes mellitus), type 2, uncontrolled ([place]) [E11.65] 12/12/2016  . Dehydration [E86.0] 12/12/2016  . Suicidal ideations [R45.851] 12/12/2016  . Bipolar disorder (manic depression) ([place]) [F31.9] 12/12/2016  . Hyperglycemia due to type 2 diabetes mellitus ([place]) [E11.65] 12/12/2016  .  AKI (acute kidney injury) ([place]) [N17.9] 12/12/2016  . DKA, type 2, not at goal [facility]) [E11.10] 12/12/2016  . Obesity, Class III, BMI 40-49.9 (morbid obesity) ([place]) [E66.01]   . Type 2 diabetes mellitus without complication, without long-term current use of insulin ([place]) [E11.9] 12/10/2016  . Lithium use [Z79.899] 12/10/2016  . Moderate single current episode of major depressive disorder ([place]) [F32.1] 04/15/2016  . Pain in joint, shoulder region [M25.519] 04/14/2016  . Diverticulosis of large intestine without hemorrhage [K57.30] 11/07/2015  . Calculus of gallbladder without cholecystitis [K80.20] 11/07/2015   Total Time spent with patient: 30 minutes  Past Psychiatric History: See admission H&P  Past Medical History:  Past Medical History:  Diagnosis Date  . Depression   . Diabetes mellitus without complication ([place])   . Gallstones   . Obesity, Class III, BMI 40-49.9 (morbid obesity) ([place])    History reviewed. No pertinent surgical history. Family History:  Family History  Problem Relation Age of Onset  . Hypertension Mother   . Depression Mother   . Heart attack Maternal Grandfather   . Lymphoma Maternal Grandmother   . Diabetes Maternal Aunt   . Cancer Maternal Aunt    Family Psychiatric  History: See admission H&P Social History:  Social History   Substance and Sexual Activity  Alcohol Use Yes  . Alcohol/week: 0.0 oz   Comment: once every 2 weeks. Wine coolers     Social History   Substance and Sexual Activity  Drug Use No    Social History   Socioeconomic History  . Marital status: Single    Spouse name: Not on file  . Number of children: 0  .  Years of education: Not on file  . Highest education level: Not on file  Occupational History  . Occupation: call center  Social Needs  . Financial resource strain: Not on file  . Food insecurity:    Worry: Not on file    Inability: Not on file  . Transportation needs:    Medical: Not on file    Non-medical: Not  on file  Tobacco Use  . Smoking status: Never Smoker  . Smokeless tobacco: Never Used  Substance and Sexual Activity  . Alcohol use: Yes    Alcohol/week: 0.0 oz    Comment: once every 2 weeks. Wine coolers  . Drug use: No  . Sexual activity: Not Currently    Birth control/protection: None  Lifestyle  . Physical activity:    Days per week: Not on file    Minutes per session: Not on file  . Stress: Not on file  Relationships  . Social connections:    Talks on phone: Not on file    Gets together: Not on file    Attends religious service: Not on file    Active member of club or organization: Not on file    Attends meetings of clubs or organizations: Not on file    Relationship status: Not on file  Other Topics Concern  . Not on file  Social History Narrative  . Not on file   Additional Social History:                         Sleep: Fair  Appetite:  Fair  Current Medications: Current Facility-Administered Medications  Medication Dose Route Frequency Provider Last Rate Last Dose  . busPIRone (BUSPAR) tablet 10 mg  10 mg Oral TID Rankin, Shuvon B, NP   10 mg at 12/14/17 0809  . FLUoxetine (PROZAC) capsule 40 mg  40 mg Oral Daily Rankin, Shuvon B, NP   40 mg at 12/14/17 0809  . insulin aspart (novoLOG) injection 10 Units  10 Units Subcutaneous TID WC Cobos, Myer Peer, MD   10 Units at 12/14/17 [phone]  . insulin glargine (LANTUS) injection 25 Units  25 Units Subcutaneous Daily Rankin, Shuvon B, NP   25 Units at 12/14/17 0809  . lamoTRIgine (LAMICTAL) tablet 25 mg  25 mg Oral BH-q7a Cobos, Myer Peer, MD   25 mg at 12/14/17 0641  . lamoTRIgine (LAMICTAL) tablet 50 mg  50 mg Oral QHS Cobos, Fernando A, MD      . metFORMIN (GLUCOPHAGE) tablet 1,000 mg  1,000 mg Oral BID WC Rankin, Shuvon B, NP   1,000 mg at 12/14/17 0809  . pantoprazole (PROTONIX) EC tablet 40 mg  40 mg Oral BID Rankin, Shuvon B, NP   40 mg at 12/14/17 0809  . polyethylene glycol (MIRALAX / GLYCOLAX)  packet 17 g  17 g Oral Daily Rankin, Shuvon B, NP      . risperiDONE (RISPERDAL) tablet 0.5 mg  0.5 mg Oral Once Sharma Covert, MD      . risperiDONE (RISPERDAL) tablet 1 mg  1 mg Oral QHS Sharma Covert, MD        Lab Results:  Results for orders placed or performed during the hospital encounter of 12/10/17 (from the past 48 hour(s))  Glucose, capillary     Status: Abnormal   Collection Time: 12/12/17 12:03 PM  Result Value Ref Range   Glucose-Capillary 198 (H) 70 - 99 mg/dL  Glucose, capillary  Status: Abnormal   Collection Time: 12/12/17  4:59 PM  Result Value Ref Range   Glucose-Capillary 244 (H) 70 - 99 mg/dL  Glucose, capillary     Status: Abnormal   Collection Time: 12/12/17  8:49 PM  Result Value Ref Range   Glucose-Capillary 142 (H) 70 - 99 mg/dL  Glucose, capillary     Status: Abnormal   Collection Time: 12/13/17  6:09 AM  Result Value Ref Range   Glucose-Capillary 163 (H) 70 - 99 mg/dL   Comment 1 Notify RN    Comment 2 Document in Chart   Glucose, capillary     Status: Abnormal   Collection Time: 12/13/17 11:51 AM  Result Value Ref Range   Glucose-Capillary 168 (H) 70 - 99 mg/dL  Glucose, capillary     Status: Abnormal   Collection Time: 12/13/17  4:59 PM  Result Value Ref Range   Glucose-Capillary 126 (H) 70 - 99 mg/dL  Glucose, capillary     Status: Abnormal   Collection Time: 12/13/17  8:49 PM  Result Value Ref Range   Glucose-Capillary 154 (H) 70 - 99 mg/dL  Glucose, capillary     Status: Abnormal   Collection Time: 12/14/17  6:12 AM  Result Value Ref Range   Glucose-Capillary 130 (H) 70 - 99 mg/dL   Comment 1 Notify RN    Comment 2 Document in Chart     Blood Alcohol level:  Lab Results  Component Value Date   ETH <10 03/08/3006    Metabolic Disorder Labs: Lab Results  Component Value Date   HGBA1C 16.7 (H) 12/09/2017   MPG 432.59 12/09/2017   No results found for: PROLACTIN Lab Results  Component Value Date   CHOL 142  03/05/2017   TRIG 148 03/05/2017   HDL 27 (L) 03/05/2017   CHOLHDL 5.3 (H) 03/05/2017   LDLCALC 85 03/05/2017   LDLCALC Comment 12/10/2016    Physical Findings: AIMS: Facial and Oral Movements Muscles of Facial Expression: None, normal Lips and Perioral Area: None, normal Jaw: None, normal Tongue: None, normal,Extremity Movements Upper (arms, wrists, hands, fingers): None, normal Lower (legs, knees, ankles, toes): None, normal, Trunk Movements Neck, shoulders, hips: None, normal, Overall Severity Severity of abnormal movements (highest score from questions above): None, normal Incapacitation due to abnormal movements: None, normal Patient's awareness of abnormal movements (rate only patient's report): No Awareness, Dental Status Current problems with teeth and/or dentures?: No Does patient usually wear dentures?: No  CIWA:    COWS:     Musculoskeletal: Strength & Muscle Tone: within normal limits Gait & Station: normal Patient leans: N/A  Psychiatric Specialty Exam: Physical Exam  Nursing note and vitals reviewed. Constitutional: He is oriented to person, place, and time. He appears well-developed and well-nourished.  HENT:  Head: Normocephalic and atraumatic.  Respiratory: Effort normal.  Neurological: He is alert and oriented to person, place, and time.    ROS  Blood pressure 121/77, pulse 97, temperature 98 F (36.7 C), temperature source Oral, resp. rate 20, height 6' (1.829 m), weight 114.8 kg (253 lb).Body mass index is 34.31 kg/m.  General Appearance: Casual  Eye Contact:  Fair  Speech:  Normal Rate  Volume:  Decreased  Mood:  Depressed  Affect:  Congruent  Thought Process:  Coherent  Orientation:  Full (Time, Place, and Person)  Thought Content:  Hallucinations: Visual  Suicidal Thoughts:  Yes.  without intent/plan  Homicidal Thoughts:  No  Memory:  Immediate;   Fair Recent;   Fair  Remote;   Fair  Judgement:  Fair  Insight:  Fair  Psychomotor  Activity:  Increased  Concentration:  Concentration: Fair and Attention Span: Fair  Recall:  [employer] of Knowledge:  Fair  Language:  Good  Akathisia:  Negative  Handed:  Right  AIMS (if indicated):     Assets:  Communication Skills Desire for Improvement Financial Resources/Insurance Housing Resilience Social Support  ADL's:  Intact  Cognition:  WNL  Sleep:  Number of Hours: 6.75     Treatment Plan Summary: Daily contact with patient to assess and evaluate symptoms and progress in treatment, Medication management and Plan Patient is seen and examined.  Patient is a 42 year old male with the above-stated past psychiatric history seen in follow-up.  He reports paresthesias.  It seems to correlate with restarting his Abilify.  We discussed that today, and will probably not going to be able to increase that given the side effects.  I am going to stop the Abilify, and place him on Risperdal.  We will give him half a milligram during the day today, and a milligram at bedtime.  We will see how that works.  I am also going to decrease his lamotrigine to 25 mg p.o. twice daily.  No other changes to the BuSpar or the fluoxetine at this time.  His blood sugar appears to be stable at this time.  He stated that the suicidal thoughts only come when he sees the visions.  He has agreed to let us know when those occur.  Sharma Covert, MD 12/14/2017, 11:09 AM

## 2017-12-14 NOTE — Progress Notes (Signed)
Pt attend wrap up group. The one thing good happened to him he was able to play basketball and make baskets today. He went to dinner and his hands were not trembling to much he could not handle the tray.

## 2017-12-14 NOTE — Progress Notes (Addendum)
Patient self inventory- Patient slept fair last night, sleep medication was not requested. Appetite has been good, concentration poor. Depression rated 7, hopelessness 6, and anxiety 8 out of 10 with 10 being the worst. Denies withdrawal symptoms. Endorses passive SI with no plan. Contracts for safety. Endorses "pins and needle tingling" in whole body as well as tremors. Patient's goal is "not visualizing killing myself" and patient said he will do this with "sleep." MD made aware.  Patient calm and pleasant upon approach. Only complaint is his passive SI. Patient compliant with medication administration. Safety maintained with 15 minute checks. Will continue to monitor.

## 2017-12-14 NOTE — Progress Notes (Signed)
Recreation Therapy Notes  Date: 7.1.19 Time: 0930 Location: [address] Dayroom  Group Topic: Stress Management  Goal Area(s) Addresses:  Patient will verbalize importance of using healthy stress management.  Patient will identify positive emotions associated with healthy stress management.   Behavioral Response: Engaged  Intervention: Stress Management  Activity :  Progressive Muscle Relaxation.  LRT introduced the stress management technique of progressive muscle relaxation.  LRT read script to guide patients through tensing and relaxing each muscle individually.  Patients were to follow along as LRT lead them through the exercise.  Education:  Stress Management, Discharge Planning.   Education Outcome: Acknowledges edcuation/In group clarification offered/Needs additional education  Clinical Observations/Feedback: Pt attended and participated in activity.     Victorino Sparrow, LRT/CTRS         Victorino Sparrow A 12/14/2017 11:39 AM

## 2017-12-14 NOTE — Progress Notes (Signed)
Writer spoke with patient 1:1 and he c/o itching and c/o rash on his inner forearms. NP J. Gwenlyn Found assessed his arms and an order for benadryl and hydrocortisone cream was ordered. Patient received these medications, will monitor effectiveness of medications. He has been observed up in the dayroom interacting with peers tonight. He attended group and participated. Support given and safety maintained on unit with 15 min checks.

## 2017-12-14 NOTE — Plan of Care (Signed)
  Problem: Education: Goal: Knowledge of [facility] Education information/materials will improve Outcome: Progressing Goal: Verbalization of understanding the information provided will improve Outcome: Progressing   Problem: Activity: Goal: Interest or engagement in activities will improve Outcome: Progressing Goal: Sleeping patterns will improve Outcome: Progressing   Problem: Coping: Goal: Ability to verbalize frustrations and anger appropriately will improve Outcome: Progressing Goal: Ability to demonstrate self-control will improve Outcome: Progressing   Problem: Health Behavior/Discharge Planning: Goal: Identification of resources available to assist in meeting health care needs will improve Outcome: Progressing Goal: Compliance with treatment plan for underlying cause of condition will improve Outcome: Progressing   Problem: Physical Regulation: Goal: Ability to maintain clinical measurements within normal limits will improve Outcome: Progressing   Problem: Safety: Goal: Periods of time without injury will increase Outcome: Progressing   Problem: Education: Goal: Ability to state activities that reduce stress will improve Outcome: Progressing   Problem: Self-Concept: Goal: Ability to modify response to factors that promote anxiety will improve Outcome: Progressing   Problem: Education: Goal: Utilization of techniques to improve thought processes will improve Outcome: Progressing Goal: Knowledge of the prescribed therapeutic regimen will improve Outcome: Progressing   Problem: Activity: Goal: Interest or engagement in leisure activities will improve Outcome: Progressing Goal: Imbalance in normal sleep/wake cycle will improve Outcome: Progressing   Problem: Coping: Goal: Coping ability will improve Outcome: Progressing Goal: Will verbalize feelings Outcome: Progressing   Problem: Health Behavior/Discharge Planning: Goal: Ability to make  decisions will improve Outcome: Progressing Goal: Compliance with therapeutic regimen will improve Outcome: Progressing   Problem: Role Relationship: Goal: Will demonstrate positive changes in social behaviors and relationships Outcome: Progressing   Problem: Safety: Goal: Ability to disclose and discuss suicidal ideas will improve Outcome: Progressing Goal: Ability to identify and utilize support systems that promote safety will improve Outcome: Progressing   Problem: Education: Goal: Ability to make informed decisions regarding treatment will improve Outcome: Progressing   Problem: Coping: Goal: Coping ability will improve Outcome: Progressing   Problem: Health Behavior/Discharge Planning: Goal: Identification of resources available to assist in meeting health care needs will improve Outcome: Progressing   Problem: Medication: Goal: Compliance with prescribed medication regimen will improve Outcome: Progressing   Problem: Self-Concept: Goal: Ability to disclose and discuss suicidal ideas will improve Outcome: Progressing

## 2017-12-15 LAB — GLUCOSE, CAPILLARY: Glucose-Capillary: 149 mg/dL — ABNORMAL HIGH (ref 70–99)

## 2017-12-15 MED ORDER — QUETIAPINE FUMARATE 50 MG PO TABS
50.0000 mg | ORAL_TABLET | Freq: Every day | ORAL | Status: DC
  Administered 2017-12-15: 50 mg via ORAL
  Filled 2017-12-15 (×4): qty 1

## 2017-12-15 MED ORDER — TRAZODONE HCL 50 MG PO TABS
50.0000 mg | ORAL_TABLET | Freq: Every evening | ORAL | Status: DC | PRN
  Filled 2017-12-15 (×5): qty 1

## 2017-12-15 MED ORDER — QUETIAPINE FUMARATE 25 MG PO TABS
25.0000 mg | ORAL_TABLET | Freq: Once | ORAL | Status: AC
  Administered 2017-12-15: 25 mg via ORAL
  Filled 2017-12-15 (×2): qty 1

## 2017-12-15 MED ORDER — LAMOTRIGINE 25 MG PO TABS
25.0000 mg | ORAL_TABLET | Freq: Two times a day (BID) | ORAL | Status: DC
  Administered 2017-12-15 – 2017-12-16 (×3): 25 mg via ORAL
  Filled 2017-12-15 (×6): qty 1

## 2017-12-15 NOTE — Plan of Care (Signed)
D: Pt  passive SI contracts for safety denies HI/AV. Pt is pleasant and cooperative. Pt  stated he was doing a little better , " re discovering caring" . Pt stated he was caring about the friends he made in here, pt was informed he needed to find some things more concrete in his life he can care about.    A: Pt was offered support and encouragement. Pt was given scheduled medications. Pt was encourage to attend groups. Q 15 minute checks were done for safety.   R:Pt attends groups and interacts well with peers and staff. Pt is taking medication. Pt has no complaints.Pt receptive to treatment and safety maintained on unit.   Problem: Education: Goal: Emotional status will improve Outcome: Progressing   Problem: Health Behavior/Discharge Planning: Goal: Compliance with treatment plan for underlying cause of condition will improve Outcome: Progressing   Problem: Education: Goal: Mental status will improve Outcome: Progressing

## 2017-12-15 NOTE — Consult Note (Signed)
ECT: Chart reviewed.  Patient appears to be an appropriate candidate for ECT treatment by what I can see in the chart.  If the patient is agreeable to ECT treatment we would be happy to entertain transfer and initiation of ECT treatment.  Please feel free to call me at [phone] or call the triage specialist at [phone].

## 2017-12-15 NOTE — Progress Notes (Signed)
Recreation Therapy Notes  Animal-Assisted Activity (AAA) Program Checklist/Progress Notes Patient Eligibility Criteria Checklist & Daily Group note for Rec Tx Intervention  Date: 7.2.19 Time: 25 Location: [address]  AAA/T Program Assumption of Risk Form signed by Teacher, music or Parent Legal Guardian YES  Patient is free of allergies or sever asthma YES  Patient reports no fear of animals YES   Patient reports no history of cruelty to animals YES   Patient understands his/her participation is voluntary YES   Patient washes hands before animal contact YES   Patient washes hands after animal contact YES   Education: Contractor, Appropriate Animal Interaction   Education Outcome: Acknowledges understanding/In group clarification offered/Needs additional education.   Clinical Observations/Feedback: Pt did not attend group.    Victorino Sparrow, LRT/CTRS         Victorino Sparrow A 12/15/2017 3:46 PM

## 2017-12-15 NOTE — [facility] Group Notes (Signed)
LCSW Group Therapy Note 12/15/2017 2:38 PM  Type of Therapy and Topic: Group Therapy: Overcoming Obstacles  Participation Level: Active  Description of Group:  In this group patients will be encouraged to explore what they see as obstacles to their own wellness and recovery. They will be guided to discuss their thoughts, feelings, and behaviors related to these obstacles. The group will process together ways to cope with barriers, with attention given to specific choices patients can make. Each patient will be challenged to identify changes they are motivated to make in order to overcome their obstacles. This group will be process-oriented, with patients participating in exploration of their own experiences as well as giving and receiving support and challenge from other group members.  Therapeutic Goals: 1. Patient will identify personal and current obstacles as they relate to admission. 2. Patient will identify barriers that currently interfere with their wellness or overcoming obstacles.  3. Patient will identify feelings, thought process and behaviors related to these barriers. 4. Patient will identify two changes they are willing to make to overcome these obstacles:   Summary of Patient Progress  Marc Long was engaged and participated throughout the group session. Marc Long reports that his main obstacle is his "lack of emotional regulation". Marc Long reports that he plans to engage in therapy services, in addition to his medication regimens. Marc Long states that this will help him regulate his emotions.    Therapeutic Modalities:  Cognitive Behavioral Therapy Solution Focused Therapy Motivational Interviewing Relapse Prevention Therapy   Theresa Duty Clinical Social Worker

## 2017-12-15 NOTE — Progress Notes (Signed)
Patient accepted to [facility] Room 302 for voluntary admission on 12/16/2017. Attending MD Claypacs. Report [phone]

## 2017-12-15 NOTE — Plan of Care (Signed)
  Problem: Activity: Goal: Interest or engagement in activities will improve Outcome: Progressing   Problem: Safety: Goal: Periods of time without injury will increase Outcome: Progressing   Problem: Self-Concept: Goal: Level of anxiety will decrease Outcome: Progressing   Problem: Medication: Goal: Compliance with prescribed medication regimen will improve Outcome: Progressing  DAR NOTE: Patient presents with flat affect and depressed mood.  Reports suicidal thoughts and visual hallucination but contract for safety.  Rates depression at 7, hopelessness at 7, and anxiety at 8.  Maintained on routine safety checks.  Medications given as prescribed.  Support and encouragement offered as needed.  Attended group and participated.  States goal for today is "no visions."  Patient visible in milieu interacting with peers.  Offered no complaint.

## 2017-12-15 NOTE — [facility] Group Notes (Signed)
Pt attended spiritual care group on grief and loss facilitated by chaplain Shaterria Sager   Group opened with brief discussion and psycho-social ed around grief and loss in relationships and in relation to self - identifying life patterns, circumstances, changes that cause losses. Established group norm of speaking from own life experience. Group goal of establishing open and affirming space for members to share loss and experience with grief, normalize grief experience and provide psycho social education and grief support.     

## 2017-12-15 NOTE — Progress Notes (Signed)
[facility] MD Progress Note  12/15/2017 10:02 AM Marc Long  MRN:  [id number] Subjective: Patient is seen and examined.  Patient is a 42 year old male with past psychiatric history significant for major depression, recurrent, severe.  He is seen in follow-up.  He stated he felt lethargic today.  He also stated that the Risperdal had no benefit on his "visualizations".  He stated he feels about the same as yesterday.  His thoughts of self-harm were about the same after having the visualizations of stabbing himself in the eye etc.  We discussed the fact that he had been on Abilify previously, and led to weight gain.  The Risperdal does not appear to be tolerated well.  We discussed starting Seroquel to see if it was more beneficial.  He continues on a wean of Lamictal, BuSpar 10 mg p.o. 3 times daily, and fluoxetine 40 mg p.o. daily.  His vital signs are stable, and his blood sugar this morning is 149. Principal Problem: <principal problem not specified> Diagnosis:   Patient Active Problem List   Diagnosis Date Noted  . MDD (major depressive disorder), recurrent episode, severe ([place]) [F33.2] 12/10/2017  . Obesity (BMI 30-39.9) [E66.9] 12/09/2017  . Depression with suicidal ideation [F32.9, R45.851]   . DKA (diabetic ketoacidoses) ([place]) [E13.10] 12/05/2017  . Hypertriglyceridemia [E78.1] 02/05/2017  . DM (diabetes mellitus), type 2, uncontrolled ([place]) [E11.65] 12/12/2016  . Dehydration [E86.0] 12/12/2016  . Suicidal ideations [R45.851] 12/12/2016  . Bipolar disorder (manic depression) ([place]) [F31.9] 12/12/2016  . Hyperglycemia due to type 2 diabetes mellitus ([place]) [E11.65] 12/12/2016  . AKI (acute kidney injury) ([place]) [N17.9] 12/12/2016  . DKA, type 2, not at goal [facility]) [E11.10] 12/12/2016  . Obesity, Class III, BMI 40-49.9 (morbid obesity) ([place]) [E66.01]   . Type 2 diabetes mellitus without complication, without long-term current use of insulin ([place]) [E11.9] 12/10/2016  . Lithium use [Z79.899] 12/10/2016   . Moderate single current episode of major depressive disorder ([place]) [F32.1] 04/15/2016  . Pain in joint, shoulder region [M25.519] 04/14/2016  . Diverticulosis of large intestine without hemorrhage [K57.30] 11/07/2015  . Calculus of gallbladder without cholecystitis [K80.20] 11/07/2015   Total Time spent with patient: 30 minutes  Past Psychiatric History: See admission H&P  Past Medical History:  Past Medical History:  Diagnosis Date  . Depression   . Diabetes mellitus without complication ([place])   . Gallstones   . Obesity, Class III, BMI 40-49.9 (morbid obesity) ([place])    History reviewed. No pertinent surgical history. Family History:  Family History  Problem Relation Age of Onset  . Hypertension Mother   . Depression Mother   . Heart attack Maternal Grandfather   . Lymphoma Maternal Grandmother   . Diabetes Maternal Aunt   . Cancer Maternal Aunt    Family Psychiatric  History: See admission H&P Social History:  Social History   Substance and Sexual Activity  Alcohol Use Yes  . Alcohol/week: 0.0 oz   Comment: once every 2 weeks. Wine coolers     Social History   Substance and Sexual Activity  Drug Use No    Social History   Socioeconomic History  . Marital status: Single    Spouse name: Not on file  . Number of children: 0  . Years of education: Not on file  . Highest education level: Not on file  Occupational History  . Occupation: call center  Social Needs  . Financial resource strain: Not on file  . Food insecurity:    Worry: Not on  file    Inability: Not on file  . Transportation needs:    Medical: Not on file    Non-medical: Not on file  Tobacco Use  . Smoking status: Never Smoker  . Smokeless tobacco: Never Used  Substance and Sexual Activity  . Alcohol use: Yes    Alcohol/week: 0.0 oz    Comment: once every 2 weeks. Wine coolers  . Drug use: No  . Sexual activity: Not Currently    Birth control/protection: None  Lifestyle  . Physical  activity:    Days per week: Not on file    Minutes per session: Not on file  . Stress: Not on file  Relationships  . Social connections:    Talks on phone: Not on file    Gets together: Not on file    Attends religious service: Not on file    Active member of club or organization: Not on file    Attends meetings of clubs or organizations: Not on file    Relationship status: Not on file  Other Topics Concern  . Not on file  Social History Narrative  . Not on file   Additional Social History:                         Sleep: Fair  Appetite:  Fair  Current Medications: Current Facility-Administered Medications  Medication Dose Route Frequency Provider Last Rate Last Dose  . busPIRone (BUSPAR) tablet 10 mg  10 mg Oral TID Rankin, Shuvon B, NP   10 mg at 12/15/17 0841  . FLUoxetine (PROZAC) capsule 40 mg  40 mg Oral Daily Rankin, Shuvon B, NP   40 mg at 12/15/17 0841  . hydrocortisone cream 1 %   Topical BID PRN Rozetta Nunnery, NP   1 application at 01/60/10 2053  . insulin aspart (novoLOG) injection 10 Units  10 Units Subcutaneous TID WC Cobos, Myer Peer, MD   10 Units at 12/15/17 [phone]  . insulin glargine (LANTUS) injection 25 Units  25 Units Subcutaneous Daily Rankin, Shuvon B, NP   25 Units at 12/15/17 0841  . lamoTRIgine (LAMICTAL) tablet 25 mg  25 mg Oral BH-q7a Cobos, Myer Peer, MD   25 mg at 12/15/17 5573  . lamoTRIgine (LAMICTAL) tablet 50 mg  50 mg Oral QHS Cobos, Myer Peer, MD   50 mg at 12/14/17 2054  . metFORMIN (GLUCOPHAGE) tablet 1,000 mg  1,000 mg Oral BID WC Rankin, Shuvon B, NP   1,000 mg at 12/15/17 0841  . pantoprazole (PROTONIX) EC tablet 40 mg  40 mg Oral BID Rankin, Shuvon B, NP   40 mg at 12/15/17 0841  . polyethylene glycol (MIRALAX / GLYCOLAX) packet 17 g  17 g Oral Daily Rankin, Shuvon B, NP      . QUEtiapine (SEROQUEL) tablet 25 mg  25 mg Oral Once Sharma Covert, MD      . QUEtiapine (SEROQUEL) tablet 50 mg  50 mg Oral QHS Sharma Covert, MD        Lab Results:  Results for orders placed or performed during the hospital encounter of 12/10/17 (from the past 48 hour(s))  Glucose, capillary     Status: Abnormal   Collection Time: 12/13/17 11:51 AM  Result Value Ref Range   Glucose-Capillary 168 (H) 70 - 99 mg/dL  Glucose, capillary     Status: Abnormal   Collection Time: 12/13/17  4:59 PM  Result Value Ref Range  Glucose-Capillary 126 (H) 70 - 99 mg/dL  Glucose, capillary     Status: Abnormal   Collection Time: 12/13/17  8:49 PM  Result Value Ref Range   Glucose-Capillary 154 (H) 70 - 99 mg/dL  Glucose, capillary     Status: Abnormal   Collection Time: 12/14/17  6:12 AM  Result Value Ref Range   Glucose-Capillary 130 (H) 70 - 99 mg/dL   Comment 1 Notify RN    Comment 2 Document in Chart   Glucose, capillary     Status: Abnormal   Collection Time: 12/14/17 11:58 AM  Result Value Ref Range   Glucose-Capillary 134 (H) 70 - 99 mg/dL  Glucose, capillary     Status: Abnormal   Collection Time: 12/14/17  4:55 PM  Result Value Ref Range   Glucose-Capillary 181 (H) 70 - 99 mg/dL  Glucose, capillary     Status: None   Collection Time: 12/14/17  8:48 PM  Result Value Ref Range   Glucose-Capillary 90 70 - 99 mg/dL  Glucose, capillary     Status: Abnormal   Collection Time: 12/15/17  6:27 AM  Result Value Ref Range   Glucose-Capillary 149 (H) 70 - 99 mg/dL    Blood Alcohol level:  Lab Results  Component Value Date   ETH <10 82/99/3716    Metabolic Disorder Labs: Lab Results  Component Value Date   HGBA1C 16.7 (H) 12/09/2017   MPG 432.59 12/09/2017   No results found for: PROLACTIN Lab Results  Component Value Date   CHOL 142 03/05/2017   TRIG 148 03/05/2017   HDL 27 (L) 03/05/2017   CHOLHDL 5.3 (H) 03/05/2017   LDLCALC 85 03/05/2017   LDLCALC Comment 12/10/2016    Physical Findings: AIMS: Facial and Oral Movements Muscles of Facial Expression: None, normal Lips and Perioral Area: None,  normal Jaw: None, normal Tongue: None, normal,Extremity Movements Upper (arms, wrists, hands, fingers): None, normal Lower (legs, knees, ankles, toes): None, normal, Trunk Movements Neck, shoulders, hips: None, normal, Overall Severity Severity of abnormal movements (highest score from questions above): None, normal Incapacitation due to abnormal movements: None, normal Patient's awareness of abnormal movements (rate only patient's report): No Awareness, Dental Status Current problems with teeth and/or dentures?: No Does patient usually wear dentures?: No  CIWA:    COWS:     Musculoskeletal: Strength & Muscle Tone: within normal limits Gait & Station: normal Patient leans: N/A  Psychiatric Specialty Exam: Physical Exam  Constitutional: He is oriented to person, place, and time. He appears well-developed and well-nourished.  HENT:  Head: Normocephalic and atraumatic.  Respiratory: Effort normal.  Neurological: He is alert and oriented to person, place, and time.    ROS  Blood pressure 122/65, pulse (!) 105, temperature 97.7 F (36.5 C), temperature source Oral, resp. rate 20, height 6' (1.829 m), weight 114.8 kg (253 lb).Body mass index is 34.31 kg/m.  General Appearance: Disheveled  Eye Contact:  Fair  Speech:  Slow  Volume:  Decreased  Mood:  Depressed  Affect:  Congruent  Thought Process:  Coherent  Orientation:  Full (Time, Place, and Person)  Thought Content:  Hallucinations: Visual  Suicidal Thoughts:  Yes.  without intent/plan  Homicidal Thoughts:  No  Memory:  Immediate;   Fair Recent;   Fair Remote;   Fair  Judgement:  Intact  Insight:  Fair  Psychomotor Activity:  Decreased  Concentration:  Concentration: Fair and Attention Span: Fair  Recall:  [employer] of Knowledge:  Good  Language:  Fair  Akathisia:  Negative  Handed:  Right  AIMS (if indicated):     Assets:  Desire for Improvement Housing Resilience  ADL's:  Intact  Cognition:  WNL  Sleep:   Number of Hours: 6.5     Treatment Plan Summary: Daily contact with patient to assess and evaluate symptoms and progress in treatment, Medication management and Plan Patient is seen and examined.  Patient is a 42 year old male with the above-stated past psychiatric history seen in follow-up.  He feels more lethargic this morning.  As well, he stated he is still having the visualizations.  It appears as though we will not be able to increase the Risperdal given the lethargy already.  I am going to stop the Risperdal this morning.  He had previously been on Abilify as well, but led to weight gain.  We will try low-dose Seroquel.  I am going to give him 25 mg p.o. during the day to day, and then 50 mg starting at bedtime.  Additionally I will reduce his Lamictal to 25 mg p.o. twice daily.  I will also go on and call for Dr. Weber Cooks to consult for possible ECT.  His vital signs are stable, and his blood sugar this morning is looking good.  No changes in those treatment.  Sharma Covert, MD 12/15/2017, 10:02 AM

## 2017-12-16 ENCOUNTER — Inpatient Hospital Stay
Admission: AD | Admit: 2017-12-16 | Discharge: 2017-12-26 | DRG: 885 | Disposition: A | Discharge status: Home / Self Care | Payer: Medicaid Other | Source: Intra-hospital | Attending: Psychiatry | Admitting: Psychiatry

## 2017-12-16 ENCOUNTER — Other Ambulatory Visit: Payer: Self-pay

## 2017-12-16 DIAGNOSIS — Z807 Family history of other malignant neoplasms of lymphoid, hematopoietic and related tissues: Secondary | ICD-10-CM | POA: Diagnosis not present

## 2017-12-16 DIAGNOSIS — F332 Major depressive disorder, recurrent severe without psychotic features: Secondary | ICD-10-CM | POA: Diagnosis present

## 2017-12-16 DIAGNOSIS — E781 Pure hyperglyceridemia: Secondary | ICD-10-CM | POA: Diagnosis present

## 2017-12-16 DIAGNOSIS — Z794 Long term (current) use of insulin: Secondary | ICD-10-CM | POA: Diagnosis not present

## 2017-12-16 DIAGNOSIS — R200 Anesthesia of skin: Secondary | ICD-10-CM

## 2017-12-16 DIAGNOSIS — F333 Major depressive disorder, recurrent, severe with psychotic symptoms: Secondary | ICD-10-CM | POA: Diagnosis not present

## 2017-12-16 DIAGNOSIS — Z91030 Bee allergy status: Secondary | ICD-10-CM

## 2017-12-16 DIAGNOSIS — E119 Type 2 diabetes mellitus without complications: Secondary | ICD-10-CM | POA: Diagnosis present

## 2017-12-16 DIAGNOSIS — Z6838 Body mass index (BMI) 38.0-38.9, adult: Secondary | ICD-10-CM

## 2017-12-16 DIAGNOSIS — Z7984 Long term (current) use of oral hypoglycemic drugs: Secondary | ICD-10-CM

## 2017-12-16 DIAGNOSIS — K5730 Diverticulosis of large intestine without perforation or abscess without bleeding: Secondary | ICD-10-CM | POA: Diagnosis present

## 2017-12-16 DIAGNOSIS — Z881 Allergy status to other antibiotic agents status: Secondary | ICD-10-CM | POA: Diagnosis not present

## 2017-12-16 DIAGNOSIS — E118 Type 2 diabetes mellitus with unspecified complications: Secondary | ICD-10-CM

## 2017-12-16 DIAGNOSIS — Z818 Family history of other mental and behavioral disorders: Secondary | ICD-10-CM | POA: Diagnosis not present

## 2017-12-16 DIAGNOSIS — R45851 Suicidal ideations: Secondary | ICD-10-CM | POA: Diagnosis present

## 2017-12-16 DIAGNOSIS — Z91013 Allergy to seafood: Secondary | ICD-10-CM | POA: Diagnosis not present

## 2017-12-16 DIAGNOSIS — Z888 Allergy status to other drugs, medicaments and biological substances status: Secondary | ICD-10-CM

## 2017-12-16 DIAGNOSIS — Z8249 Family history of ischemic heart disease and other diseases of the circulatory system: Secondary | ICD-10-CM | POA: Diagnosis not present

## 2017-12-16 DIAGNOSIS — Z833 Family history of diabetes mellitus: Secondary | ICD-10-CM | POA: Diagnosis not present

## 2017-12-16 LAB — GLUCOSE, CAPILLARY
GLUCOSE-CAPILLARY: 119 mg/dL — AB (ref 70–99)
Glucose-Capillary: 127 mg/dL — ABNORMAL HIGH (ref 70–99)

## 2017-12-16 MED ORDER — TRAZODONE HCL 50 MG PO TABS
50.0000 mg | ORAL_TABLET | Freq: Every day | ORAL | Status: DC
  Administered 2017-12-16 – 2017-12-25 (×10): 50 mg via ORAL
  Filled 2017-12-16 (×11): qty 1

## 2017-12-16 MED ORDER — FLUOXETINE HCL 20 MG PO CAPS
40.0000 mg | ORAL_CAPSULE | Freq: Every day | ORAL | Status: DC
  Administered 2017-12-16 – 2017-12-26 (×11): 40 mg via ORAL
  Filled 2017-12-16 (×11): qty 2

## 2017-12-16 MED ORDER — QUETIAPINE FUMARATE 25 MG PO TABS
50.0000 mg | ORAL_TABLET | Freq: Every day | ORAL | Status: DC
  Administered 2017-12-16 – 2017-12-21 (×6): 50 mg via ORAL
  Filled 2017-12-16 (×6): qty 2

## 2017-12-16 MED ORDER — INSULIN GLARGINE 100 UNIT/ML ~~LOC~~ SOLN: 25.0000 [IU] | Freq: Every day | SUBCUTANEOUS | 11 refills | Status: DC

## 2017-12-16 MED ORDER — INSULIN GLARGINE 100 UNIT/ML ~~LOC~~ SOLN
25.0000 [IU] | Freq: Every day | SUBCUTANEOUS | Status: DC
  Administered 2017-12-16 – 2017-12-20 (×5): 25 [IU] via SUBCUTANEOUS
  Filled 2017-12-16 (×7): qty 0.25

## 2017-12-16 MED ORDER — QUETIAPINE FUMARATE 50 MG PO TABS: 50.0000 mg | ORAL_TABLET | Freq: Every day | ORAL | Status: DC

## 2017-12-16 MED ORDER — HYDROCORTISONE 1 % EX CREA
TOPICAL_CREAM | Freq: Two times a day (BID) | CUTANEOUS | Status: DC | PRN
  Filled 2017-12-16: qty 28

## 2017-12-16 MED ORDER — INSULIN ASPART 100 UNIT/ML ~~LOC~~ SOLN
10.0000 [IU] | Freq: Three times a day (TID) | SUBCUTANEOUS | Status: DC
  Administered 2017-12-17 – 2017-12-20 (×8): 10 [IU] via SUBCUTANEOUS
  Filled 2017-12-16 (×7): qty 1

## 2017-12-16 MED ORDER — PANTOPRAZOLE SODIUM 40 MG PO TBEC
40.0000 mg | DELAYED_RELEASE_TABLET | Freq: Every day | ORAL | Status: DC
  Administered 2017-12-16 – 2017-12-26 (×11): 40 mg via ORAL
  Filled 2017-12-16 (×12): qty 1

## 2017-12-16 MED ORDER — ACETAMINOPHEN 325 MG PO TABS
650.0000 mg | ORAL_TABLET | Freq: Four times a day (QID) | ORAL | Status: DC | PRN
  Administered 2017-12-18: 650 mg via ORAL
  Filled 2017-12-16: qty 2

## 2017-12-16 MED ORDER — HYDROCORTISONE 1 % EX CREA: TOPICAL_CREAM | Freq: Two times a day (BID) | CUTANEOUS | 0 refills | Status: DC | PRN

## 2017-12-16 MED ORDER — METFORMIN HCL 500 MG PO TABS
1000.0000 mg | ORAL_TABLET | Freq: Two times a day (BID) | ORAL | Status: DC
  Administered 2017-12-16 – 2017-12-26 (×19): 1000 mg via ORAL
  Filled 2017-12-16 (×20): qty 2

## 2017-12-16 MED ORDER — POLYETHYLENE GLYCOL 3350 17 G PO PACK
17.0000 g | PACK | Freq: Every day | ORAL | 0 refills | Status: DC
Start: 2017-12-16 — End: 2017-12-26

## 2017-12-16 MED ORDER — TRAZODONE HCL 100 MG PO TABS
100.0000 mg | ORAL_TABLET | Freq: Every evening | ORAL | Status: DC | PRN
  Administered 2017-12-18 – 2017-12-24 (×4): 100 mg via ORAL
  Filled 2017-12-16 (×5): qty 1

## 2017-12-16 MED ORDER — MAGNESIUM HYDROXIDE 400 MG/5ML PO SUSP: 30.0000 mL | Freq: Every day | ORAL | Status: DC | PRN

## 2017-12-16 MED ORDER — INSULIN ASPART 100 UNIT/ML ~~LOC~~ SOLN: 10.0000 [IU] | Freq: Three times a day (TID) | SUBCUTANEOUS | 11 refills | Status: DC

## 2017-12-16 MED ORDER — LAMOTRIGINE 25 MG PO TABS: 25.0000 mg | ORAL_TABLET | Freq: Two times a day (BID) | ORAL | Status: DC

## 2017-12-16 MED ORDER — POLYETHYLENE GLYCOL 3350 17 G PO PACK
17.0000 g | PACK | Freq: Every day | ORAL | Status: DC
  Administered 2017-12-16 – 2017-12-19 (×2): 17 g via ORAL
  Filled 2017-12-16 (×8): qty 1

## 2017-12-16 MED ORDER — ALUM & MAG HYDROXIDE-SIMETH 200-200-20 MG/5ML PO SUSP: 30.0000 mL | ORAL | Status: DC | PRN

## 2017-12-16 MED ORDER — BUSPIRONE HCL 10 MG PO TABS
10.0000 mg | ORAL_TABLET | Freq: Three times a day (TID) | ORAL | Status: DC
  Administered 2017-12-16 – 2017-12-26 (×25): 10 mg via ORAL
  Filled 2017-12-16 (×31): qty 1

## 2017-12-16 MED ORDER — LAMOTRIGINE 25 MG PO TABS
25.0000 mg | ORAL_TABLET | Freq: Two times a day (BID) | ORAL | Status: DC
  Administered 2017-12-16 – 2017-12-18 (×4): 25 mg via ORAL
  Filled 2017-12-16 (×4): qty 1

## 2017-12-16 MED ORDER — HYDROXYZINE HCL 50 MG PO TABS
50.0000 mg | ORAL_TABLET | Freq: Three times a day (TID) | ORAL | Status: DC | PRN
  Administered 2017-12-22 – 2017-12-23 (×3): 50 mg via ORAL
  Filled 2017-12-16 (×3): qty 1

## 2017-12-16 MED ORDER — TRAZODONE HCL 50 MG PO TABS: 50.0000 mg | ORAL_TABLET | Freq: Every evening | ORAL | Status: DC | PRN

## 2017-12-16 NOTE — Progress Notes (Signed)
  [facility] Adult Case Management Discharge Plan :  Will you be returning to the same living situation after discharge:  No, patient is being transported to [facility] for continuity of care. Patient has been accepted for ECT therapy with Dr. Weber Cooks.  At discharge, do you have transportation home?: Yes,  hospital transfer; [facility] transportation Do you have the ability to pay for your medications: No.  Release of information consent forms completed and in the chart;  Patient's signature needed at discharge.  Patient to Follow up at: Follow-up Information    [facility]. Go on 12/24/2017.   Specialty:  Behavioral Health Why:  Appointment for medication management and therapy services is Thursday, 12/24/17 at 9:40am with Tim. Please be sure to bring your Photo ID and any discharge paperwork from this hospitalization.  Contact information: [place] [address] [phone]        [facility]. Go on 12/21/2017.   Why:  Appointment is Monday, 12/21/17 at 1:30pm. Please be sure to bring your Photo ID and any discharge paperwork from this hospitalization.  Contact information: [address] [place], [place]  [phone] [phone]          Next level of care provider has access to [place] and Suicide Prevention discussed: Yes,  with the patient  Have you used any form of tobacco in the last 30 days? (Cigarettes, Smokeless Tobacco, Cigars, and/or Pipes): No  Has patient been referred to the Quitline?: N/A patient is not a smoker  Patient has been referred for addiction treatment: [place], [place] 12/16/2017, 1:00 PM

## 2017-12-16 NOTE — Plan of Care (Signed)
  Problem: Education: Goal: Knowledge of [facility] Education information/materials will improve Outcome: Adequate for Discharge Goal: Emotional status will improve Outcome: Adequate for Discharge Goal: Mental status will improve Outcome: Adequate for Discharge Goal: Verbalization of understanding the information provided will improve Outcome: Adequate for Discharge   Problem: Activity: Goal: Interest or engagement in activities will improve Outcome: Adequate for Discharge Goal: Sleeping patterns will improve Outcome: Adequate for Discharge   Problem: Coping: Goal: Ability to verbalize frustrations and anger appropriately will improve Outcome: Adequate for Discharge Goal: Ability to demonstrate self-control will improve Outcome: Adequate for Discharge   Problem: Health Behavior/Discharge Planning: Goal: Identification of resources available to assist in meeting health care needs will improve Outcome: Adequate for Discharge Goal: Compliance with treatment plan for underlying cause of condition will improve Outcome: Adequate for Discharge   Problem: Physical Regulation: Goal: Ability to maintain clinical measurements within normal limits will improve Outcome: Adequate for Discharge   Problem: Safety: Goal: Periods of time without injury will increase Outcome: Adequate for Discharge   Problem: Education: Goal: Ability to state activities that reduce stress will improve Outcome: Adequate for Discharge   Problem: Coping: Goal: Ability to identify and develop effective coping behavior will improve Outcome: Adequate for Discharge   Problem: Self-Concept: Goal: Ability to identify factors that promote anxiety will improve Outcome: Adequate for Discharge Goal: Level of anxiety will decrease Outcome: Adequate for Discharge Goal: Ability to modify response to factors that promote anxiety will improve Outcome: Adequate for Discharge   Problem: Education: Goal:  Utilization of techniques to improve thought processes will improve Outcome: Adequate for Discharge Goal: Knowledge of the prescribed therapeutic regimen will improve Outcome: Adequate for Discharge   Problem: Activity: Goal: Interest or engagement in leisure activities will improve Outcome: Adequate for Discharge Goal: Imbalance in normal sleep/wake cycle will improve Outcome: Adequate for Discharge   Problem: Coping: Goal: Coping ability will improve Outcome: Adequate for Discharge Goal: Will verbalize feelings Outcome: Adequate for Discharge   Problem: Health Behavior/Discharge Planning: Goal: Ability to make decisions will improve Outcome: Adequate for Discharge Goal: Compliance with therapeutic regimen will improve Outcome: Adequate for Discharge   Problem: Role Relationship: Goal: Will demonstrate positive changes in social behaviors and relationships Outcome: Adequate for Discharge   Problem: Safety: Goal: Ability to disclose and discuss suicidal ideas will improve Outcome: Adequate for Discharge Goal: Ability to identify and utilize support systems that promote safety will improve Outcome: Adequate for Discharge   Problem: Self-Concept: Goal: Will verbalize positive feelings about self Outcome: Adequate for Discharge Goal: Level of anxiety will decrease Outcome: Adequate for Discharge   Problem: Education: Goal: Ability to make informed decisions regarding treatment will improve Outcome: Adequate for Discharge   Problem: Coping: Goal: Coping ability will improve Outcome: Adequate for Discharge   Problem: Health Behavior/Discharge Planning: Goal: Identification of resources available to assist in meeting health care needs will improve Outcome: Adequate for Discharge   Problem: Medication: Goal: Compliance with prescribed medication regimen will improve Outcome: Adequate for Discharge   Problem: Self-Concept: Goal: Ability to disclose and discuss suicidal  ideas will improve Outcome: Adequate for Discharge Goal: Will verbalize positive feelings about self Outcome: Adequate for Discharge

## 2017-12-16 NOTE — Discharge Summary (Signed)
Physician Discharge Summary Note  Patient:  Marc Long is an 42 y.o., male MRN:  [id number] DOB:  1976-02-12 Patient phone:  [phone] (home)  Patient address:   [place] [address],  Total Time spent with patient: 20 minutes  Date of Admission:  12/10/2017 Date of Discharge: 12/16/17  Reason for Admission:  Worsening depression with SI  Principal Problem: MDD (major depressive disorder), recurrent episode, severe [facility]) Discharge Diagnoses: Patient Active Problem List   Diagnosis Date Noted  . MDD (major depressive disorder), recurrent episode, severe ([place]) [F33.2] 12/10/2017  . Obesity (BMI 30-39.9) [E66.9] 12/09/2017  . Depression with suicidal ideation [F32.9, R45.851]   . DKA (diabetic ketoacidoses) ([place]) [E13.10] 12/05/2017  . Hypertriglyceridemia [E78.1] 02/05/2017  . DM (diabetes mellitus), type 2, uncontrolled ([place]) [E11.65] 12/12/2016  . Dehydration [E86.0] 12/12/2016  . Suicidal ideations [R45.851] 12/12/2016  . Bipolar disorder (manic depression) ([place]) [F31.9] 12/12/2016  . Hyperglycemia due to type 2 diabetes mellitus ([place]) [E11.65] 12/12/2016  . AKI (acute kidney injury) ([place]) [N17.9] 12/12/2016  . DKA, type 2, not at goal [facility]) [E11.10] 12/12/2016  . Obesity, Class III, BMI 40-49.9 (morbid obesity) ([place]) [E66.01]   . Type 2 diabetes mellitus without complication, without long-term current use of insulin ([place]) [E11.9] 12/10/2016  . Lithium use [Z79.899] 12/10/2016  . Moderate single current episode of major depressive disorder ([place]) [F32.1] 04/15/2016  . Pain in joint, shoulder region [M25.519] 04/14/2016  . Diverticulosis of large intestine without hemorrhage [K57.30] 11/07/2015  . Calculus of gallbladder without cholecystitis [K80.20] 11/07/2015    Past Psychiatric History: no prior psychiatric admissions . He has been diagnosed with Bipolar Disorder in the past . Does not describe any clear history of mania or hypomania, does endorse  history of depression.  Reports he has never attempted suicide. Denies history of self cutting or self injurious behaviors. Denies history of psychosis. Denies history of violence .  Past Medical History:  Past Medical History:  Diagnosis Date  . Depression   . Diabetes mellitus without complication ([place])   . Gallstones   . Obesity, Class III, BMI 40-49.9 (morbid obesity) ([place])    History reviewed. No pertinent surgical history. Family History:  Family History  Problem Relation Age of Onset  . Hypertension Mother   . Depression Mother   . Heart attack Maternal Grandfather   . Lymphoma Maternal Grandmother   . Diabetes Maternal Aunt   . Cancer Maternal Aunt    Family Psychiatric  History: reports mother has history of depression, no suicides in family, biological father had problem with gambling   Social History:  Social History   Substance and Sexual Activity  Alcohol Use Yes  . Alcohol/week: 0.0 oz   Comment: once every 2 weeks. Wine coolers     Social History   Substance and Sexual Activity  Drug Use No    Social History   Socioeconomic History  . Marital status: Single    Spouse name: Not on file  . Number of children: 0  . Years of education: Not on file  . Highest education level: Not on file  Occupational History  . Occupation: call center  Social Needs  . Financial resource strain: Not on file  . Food insecurity:    Worry: Not on file    Inability: Not on file  . Transportation needs:    Medical: Not on file    Non-medical: Not on file  Tobacco Use  . Smoking status: Never Smoker  .  Smokeless tobacco: Never Used  Substance and Sexual Activity  . Alcohol use: Yes    Alcohol/week: 0.0 oz    Comment: once every 2 weeks. Wine coolers  . Drug use: No  . Sexual activity: Not Currently    Birth control/protection: None  Lifestyle  . Physical activity:    Days per week: Not on file    Minutes per session: Not on file  . Stress: Not on file   Relationships  . Social connections:    Talks on phone: Not on file    Gets together: Not on file    Attends religious service: Not on file    Active member of club or organization: Not on file    Attends meetings of clubs or organizations: Not on file    Relationship status: Not on file  Other Topics Concern  . Not on file  Social History Narrative  . Not on file    Hospital Course:   12/11/17 [facility] MD Assessment: 42 year old male, single, lives with roommate  Admitted to inpatient medical unit with DKA on 6/22. Seen by Psychiatrist CL consultant, who recommended inpatient psychiatric treatment upon medical clearance. Reports he has been feeling depressed for several months, states his depression is chronic but has tended to worsen, feeling he had nothing to live for,experiencing thoughts of death, describes " seeing images of me killing myself ".  He  states he had stopped diabetic medications/stopped taking proper care of his DM in order to die. Endorses neuro-vegetative symptoms of depression.  12/15/17 [facility] MD Progress Note: Daily contact with patient to assess and evaluate symptoms and progress in treatment, Medication management and Plan Patient is seen and examined.  Patient is a 42 year old male with the above-stated past psychiatric history seen in follow-up.  He feels more lethargic this morning.  As well, he stated he is still having the visualizations.  It appears as though we will not be able to increase the Risperdal given the lethargy already.  I am going to stop the Risperdal this morning.  He had previously been on Abilify as well, but led to weight gain.  We will try low-dose Seroquel.  I am going to give him 25 mg p.o. during the day to day, and then 50 mg starting at bedtime.  Additionally I will reduce his Lamictal to 25 mg p.o. twice daily.  I will also go on and call for Dr. Weber Cooks to consult for possible ECT.  His vital signs are stable, and his blood sugar this morning is  looking good.  No changes in those treatment.  12/15/17 ECT Consult Note: ECT: Chart reviewed.  Patient appears to be an appropriate candidate for ECT treatment by what I can see in the chart.  If the patient is agreeable to ECT treatment we would be happy to entertain transfer and initiation of ECT treatment.  Please feel free to call me at [phone] or call the triage specialist at [phone].  Patient [facility] unit for 5 days. The patient partiallystabilized on medication and therapy. Patient was discharged and is being [facility] to Dr. Carlena Hurl for ECT treatment. Patient agrees to follow up at [facility]. Patient is provided with prescriptions for their medications upon discharge.    Physical Findings: AIMS: Facial and Oral Movements Muscles of Facial Expression: None, normal Lips and Perioral Area: None, normal Jaw: None, normal Tongue: None, normal,Extremity Movements Upper (arms, wrists, hands, fingers): None, normal Lower (legs, knees, ankles, toes):  None, normal, Trunk Movements Neck, shoulders, hips: None, normal, Overall Severity Severity of abnormal movements (highest score from questions above): None, normal Incapacitation due to abnormal movements: None, normal Patient's awareness of abnormal movements (rate only patient's report): No Awareness, Dental Status Current problems with teeth and/or dentures?: No Does patient usually wear dentures?: No  CIWA:    COWS:     Musculoskeletal: Strength & Muscle Tone: within normal limits Gait & Station: normal Patient leans: N/A  Psychiatric Specialty Exam: Physical Exam  ROS  Blood pressure (!) 112/96, pulse (!) 108, temperature 98.4 F (36.9 C), temperature source Oral, resp. rate 16, height 6' (1.829 m), weight 114.8 kg (253 lb), SpO2 99 %.Body mass index is 34.31 kg/m.  General Appearance: Disheveled  Eye Contact:  Fair  Speech:  Slow  Volume:  Decreased  Mood:  Depressed  Affect:  Congruent   Thought Process:  Coherent and Descriptions of Associations: Intact  Orientation:  Full (Time, Place, and Person)  Thought Content:  Hallucinations: Visual  Suicidal Thoughts:  Yes.  without intent/plan  Homicidal Thoughts:  No  Memory:  Immediate;   Fair Recent;   Fair Remote;   Fair  Judgement:  Intact  Insight:  Fair  Psychomotor Activity:  Decreased  Concentration:  Concentration: Fair and Attention Span: Fair  Recall:  Maries of Knowledge:  Good  Language:  Good  Akathisia:  No  Handed:  Right  AIMS (if indicated):     Assets:  Communication Skills Desire for Improvement Financial Resources/Insurance Housing Leisure Time Resilience Social Support Talents/Skills  ADL's:  Intact  Cognition:  WNL  Sleep:  Number of Hours: 6.5     Have you used any form of tobacco in the last 30 days? (Cigarettes, Smokeless Tobacco, Cigars, and/or Pipes): No  Has this patient used any form of tobacco in the last 30 days? (Cigarettes, Smokeless Tobacco, Cigars, and/or Pipes) Yes, No  Blood Alcohol level:  Lab Results  Component Value Date   ETH <10 67/61/9509    Metabolic Disorder Labs:  Lab Results  Component Value Date   HGBA1C 16.7 (H) 12/09/2017   MPG 432.59 12/09/2017   No results found for: PROLACTIN Lab Results  Component Value Date   CHOL 142 03/05/2017   TRIG 148 03/05/2017   HDL 27 (L) 03/05/2017   CHOLHDL 5.3 (H) 03/05/2017   LDLCALC 85 03/05/2017   [place] Comment 12/10/2016    See Psychiatric Specialty Exam and Suicide Risk Assessment completed by Attending Physician prior to discharge.  Discharge destination:  Other:  [facility] for ECT treatment  Is patient on multiple antipsychotic therapies at discharge:  No   Has Patient had three or more failed trials of antipsychotic monotherapy by history:  No  Recommended Plan for Multiple Antipsychotic Therapies: NA   Allergies as of 12/16/2017      Reactions   Bee Venom Swelling   Keflex [cephalexin]     UPSET STOMACH   Relafen [nabumetone]    Blood in stool   Shellfish Allergy Nausea Only      Medication List    STOP taking these medications   dapagliflozin propanediol 10 MG Tabs tablet Commonly known as:  FARXIGA   Exenatide ER 2 MG/0.85ML Auij Commonly known as:  BYDUREON BCISE   famotidine 40 MG tablet Commonly known as:  PEPCID   feeding supplement (GLUCERNA SHAKE) Liqd   lithium carbonate 300 MG CR tablet Commonly known as:  LITHOBID   pramipexole 0.25 MG tablet Commonly  known as:  MIRAPEX     TAKE these medications     Indication  busPIRone 10 MG tablet Commonly known as:  BUSPAR Take 10 mg by mouth 3 (three) times daily.  Indication:  Anxiety Disorder   FLUoxetine 40 MG capsule Commonly known as:  PROZAC Take 1 capsule (40 mg total) by mouth daily.  Indication:  mood stability   hydrocortisone cream 1 % Apply topically 2 (two) times daily as needed for itching.  Indication:  Itching   insulin aspart 100 UNIT/ML injection Commonly known as:  novoLOG Inject 10 Units into the skin 3 (three) times daily with meals. What changed:  how much to take  Indication:  Type 2 Diabetes   insulin glargine 100 UNIT/ML injection Commonly known as:  LANTUS Inject 0.25 mLs (25 Units total) into the skin daily.  Indication:  Type 2 Diabetes   lamoTRIgine 25 MG tablet Commonly known as:  LAMICTAL Take 1 tablet (25 mg total) by mouth 2 (two) times daily. What changed:    medication strength  See the new instructions.  Indication:  mood stability   metFORMIN 1000 MG tablet Commonly known as:  GLUCOPHAGE Take 1 tablet (1,000 mg total) by mouth 2 (two) times daily with a meal.  Indication:  Type 2 Diabetes   pantoprazole 40 MG tablet Commonly known as:  PROTONIX Take 1 tablet (40 mg total) by mouth 2 (two) times daily.  Indication:  Gastroesophageal Reflux Disease   polyethylene glycol packet Commonly known as:  MIRALAX / GLYCOLAX Take 17 g by mouth daily.   Indication:  Constipation   QUEtiapine 50 MG tablet Commonly known as:  SEROQUEL Take 1 tablet (50 mg total) by mouth at bedtime. For sleep and mood  Indication:  sleep and mood   traZODone 50 MG tablet Commonly known as:  DESYREL Take 1 tablet (50 mg total) by mouth at bedtime and may repeat dose one time if needed.  Indication:  Trouble Sleeping      Follow-up [facility]. Go on 12/24/2017.   Specialty:  Behavioral Health Why:  Appointment for medication management and therapy services is Thursday, 12/24/17 at 9:40am with Tim. Please be sure to bring your Photo ID and any discharge paperwork from this hospitalization.  Contact information: [place] [address] [phone]        [facility]. Go on 12/21/2017.   Why:  Appointment is Monday, 12/21/17 at 1:30pm. Please be sure to bring your Photo ID and any discharge paperwork from this hospitalization.  Contact information: [place], [place]  [phone] [phone]          Follow-up recommendations:  Continue activity as tolerated. Continue diet as recommended by your PCP. Ensure to keep all appointments with outpatient providers.  Comments:  Patient is instructed prior to discharge to: Take all medications as prescribed by his/her mental healthcare provider. Report any adverse effects and or reactions from the medicines to his/her outpatient provider promptly. Patient has been instructed & cautioned: To not engage in alcohol and or illegal drug use while on prescription medicines. In the event of worsening symptoms, patient is instructed to call the crisis hotline, 911 and or go to the nearest ED for appropriate evaluation and treatment of symptoms. To follow-up with his/her primary care provider for your other medical issues, concerns and or health care needs.    Signed: [name] Lenny Bouchillon, FNP 12/16/2017, 1:54 PM

## 2017-12-16 NOTE — BHH Suicide Risk Assessment (Signed)
[facility] Discharge Suicide Risk Assessment   Principal Problem: <principal problem not specified> Discharge Diagnoses:  Patient Active Problem List   Diagnosis Date Noted  . MDD (major depressive disorder), recurrent episode, severe ([place]) [F33.2] 12/10/2017  . Obesity (BMI 30-39.9) [E66.9] 12/09/2017  . Depression with suicidal ideation [F32.9, R45.851]   . DKA (diabetic ketoacidoses) ([place]) [E13.10] 12/05/2017  . Hypertriglyceridemia [E78.1] 02/05/2017  . DM (diabetes mellitus), type 2, uncontrolled ([place]) [E11.65] 12/12/2016  . Dehydration [E86.0] 12/12/2016  . Suicidal ideations [R45.851] 12/12/2016  . Bipolar disorder (manic depression) ([place]) [F31.9] 12/12/2016  . Hyperglycemia due to type 2 diabetes mellitus ([place]) [E11.65] 12/12/2016  . AKI (acute kidney injury) ([place]) [N17.9] 12/12/2016  . DKA, type 2, not at goal [facility]) [E11.10] 12/12/2016  . Obesity, Class III, BMI 40-49.9 (morbid obesity) ([place]) [E66.01]   . Type 2 diabetes mellitus without complication, without long-term current use of insulin ([place]) [E11.9] 12/10/2016  . Lithium use [Z79.899] 12/10/2016  . Moderate single current episode of major depressive disorder ([place]) [F32.1] 04/15/2016  . Pain in joint, shoulder region [M25.519] 04/14/2016  . Diverticulosis of large intestine without hemorrhage [K57.30] 11/07/2015  . Calculus of gallbladder without cholecystitis [K80.20] 11/07/2015    Total Time spent with patient: 30 minutes  Musculoskeletal: Strength & Muscle Tone: within normal limits Gait & Station: normal Patient leans: N/A  Psychiatric Specialty Exam: Review of Systems  All other systems reviewed and are negative.   Blood pressure (!) 112/96, pulse (!) 108, temperature 98.4 F (36.9 C), temperature source Oral, resp. rate 16, height 6' (1.829 m), weight 114.8 kg (253 lb), SpO2 99 %.Body mass index is 34.31 kg/m.  General Appearance: Casual  Eye Contact::  Fair  Speech:  Normal Rate409  Volume:  Normal  Mood:   Depressed  Affect:  Congruent  Thought Process:  Coherent  Orientation:  Full (Time, Place, and Person)  Thought Content:  Hallucinations: Visual  Suicidal Thoughts:  Yes.  without intent/plan  Homicidal Thoughts:  No  Memory:  Immediate;   Fair Recent;   Fair Remote;   Fair  Judgement:  Intact  Insight:  Fair  Psychomotor Activity:  Increased  Concentration:  Fair  Recall:  [employer] of Knowledge:Fair  Language: Good  Akathisia:  Negative  Handed:  Right  AIMS (if indicated):     Assets:  Communication Skills Desire for Improvement Housing Resilience Social Support  Sleep:  Number of Hours: 6.5  Cognition: WNL  ADL's:  Intact   Mental Status Per Nursing Assessment::   On Admission:  Suicidal ideation indicated by patient, Self-harm thoughts, Self-harm behaviors  Demographic Factors:  Low socioeconomic status and Unemployed  Loss Factors: NA  Historical Factors: Impulsivity  Risk Reduction Factors:   Sense of responsibility to family  Continued Clinical Symptoms:  Depression:   Anhedonia Hopelessness Impulsivity Insomnia  Cognitive Features That Contribute To Risk:  None    Suicide Risk:  Mild:  Suicidal ideation of limited frequency, intensity, duration, and specificity.  There are no identifiable plans, no associated intent, mild dysphoria and related symptoms, good self-control (both objective and subjective assessment), few other risk factors, and identifiable protective factors, including available and accessible social support.  Follow-up Information    [facility]. Go on 12/24/2017.   Specialty:  Behavioral Health Why:  Appointment for medication management and therapy services is Thursday, 12/24/17 at 9:40am with Tim. Please be sure to bring your Photo ID and any discharge paperwork from this hospitalization.  Contact information: [address] [address] [facility]. Go on 12/21/2017.    Why:  Appointment is Monday, 12/21/17 at 1:30pm. Please be sure to bring your Photo ID and any discharge paperwork from this hospitalization.  Contact information: [place], [place]  [phone]          Plan Of Care/Follow-up recommendations:  Activity:  ad lib  Sharma Covert, MD 12/16/2017, 7:45 AM

## 2017-12-16 NOTE — Therapy (Addendum)
Occupational Therapy Group Treatment Note  Date:  12/16/2017 Time:  3:37 PM  Group Topic/Focus:  Stress Management  Participation Level:  Active  Participation Quality:  Appropriate  Affect:  Flat  Cognitive:  Appropriate  Insight: Improving  Engagement in Group:  Engaged  Modes of Intervention:  Activity, Discussion, Education and Socialization  Additional Comments:    S: "I really need to work through my anger"  O: Stress management group completed to use as productive coping strategy, to help mitigate maladaptive coping to integrate in functional BADL/IADL. Stress management tool worksheet discussed to educate on unhealthy vs healthy coping skills to manage stress to improve community integration. Coping strategies taught include: relaxation based- deep breathing, counting to 10, taking a 1 minute vacation, acceptance, stress balls, relaxation audio/video, visual/mental imagery. Positive mental attitude- gratitude, acceptance, cognitive reframing, positive self talk, anger management. Coping skills bingo played with education given on variety of coping skills between bingo calls. Pts encouraged to share experience with various coping skills and share what has worked for them with others. Coloring and relaxation guide handouts given at the end of the session.   A: Pt presents to group with flat affect, but engaged and participatory throughout entirety of session. Affect improving throughout session to laughter/jokes with other group members. Stress management tools worksheet completed, pt stating he suppresses his emotions and often acts out. He would like to work on his anger management and relaxation techniques this date. Pt engaged in coping skills bingo, with much enjoyment socializing with other group members. Pt eager to acquire handouts at end of session.  P: Pt provided with education on stress management activities to implement into daily routine. Handouts given to facilitate  carryover when reintegrating into community     [facility], [place], OTR/L  [employer] 12/16/2017, 3:37 PM

## 2017-12-16 NOTE — BH Assessment (Addendum)
Patient has been accepted to [facility].  Accepting physician is Dr. Weber Cooks.  Attending Physician will be Dr. Weber Cooks.  Patient has been assigned to room 302, by [place] F.  Call report to [phone].  Representative/Transfer Coordinator is Dispensing optician Patient pre-admitted by [facility] Patient Access Elberta Fortis)  [facility] Staff Lurline Idol, Social Worker) made aware of acceptance.

## 2017-12-16 NOTE — Plan of Care (Signed)
New Admission  unable to complete  or start problem   Problem: Education: Goal: Knowledge of [place] [facility] Education information/materials will improve Outcome: Not Progressing Goal: Emotional status will improve Outcome: Not Progressing Goal: Mental status will improve Outcome: Not Progressing Goal: Verbalization of understanding the information provided will improve Outcome: Not Progressing   Problem: Coping: Goal: Coping ability will improve Outcome: Not Progressing   Problem: Self-Concept: Goal: Ability to disclose and discuss suicidal ideas will improve Outcome: Not Progressing   Problem: Activity: Goal: Interest or engagement in leisure activities will improve Outcome: Not Progressing Goal: Imbalance in normal sleep/wake cycle will improve Outcome: Not Progressing   Problem: Self-Concept: Goal: Will verbalize positive feelings about self Outcome: Not Progressing Goal: Level of anxiety will decrease Outcome: Not Progressing

## 2017-12-16 NOTE — Progress Notes (Signed)
Per Kerry Dory at [facility], the patient has been accepted for ECT with Dr. Weber Cooks.   Accepting/attending physician: Dr. Weber Cooks Room Number: 375 Number for report: [phone]  Please fax the patient's voluntary consent form to [facility] at [phone].  Dr. Myles Lipps, MD notified.  Legrand Como, RN notified.    Radonna Ricker, MSW, [place] Worker [facility]  Phone: [phone]

## 2017-12-16 NOTE — Progress Notes (Signed)
D: Admission Note:  D: Pt appeared depressed  With  a flat affect.  Pt  denies SI at this time patient present to unit  For purpose  Of ECT . Patient  Has a history of  Major Depression . Patient having  Thoughts of suicidal ideations . At present patient  Denies  This .  Patient  Voice of medication  Not working on his visualization  Pt is redirectable and cooperative with assessment.      A: Pt admitted to unit per protocol, skin assessment and search done and no contraband found. With Elige Radon RN  Pt  educated on therapeutic milieu rules. Pt was introduced to milieu by nursing staff.    R: Pt was receptive to education about the milieu .  15 min safety checks started. Probation officer offered support

## 2017-12-16 NOTE — Tx Team (Signed)
Interdisciplinary Treatment and Diagnostic Plan Update  12/16/2017 Time of Session: 9am Marc Long MRN: [id number]  Principal Diagnosis: <principal problem not specified>  Secondary Diagnoses: Active Problems:   MDD (major depressive disorder), recurrent episode, severe (HCC)   Current Medications:  Current Facility-Administered Medications  Medication Dose Route Frequency Provider Last Rate Last Dose  . busPIRone (BUSPAR) tablet 10 mg  10 mg Oral TID Rankin, Shuvon B, NP   10 mg at 12/16/17 0825  . FLUoxetine (PROZAC) capsule 40 mg  40 mg Oral Daily Rankin, Shuvon B, NP   40 mg at 12/16/17 0825  . hydrocortisone cream 1 %   Topical BID PRN Rozetta Nunnery, NP   1 application at 09/62/83 2053  . insulin aspart (novoLOG) injection 10 Units  10 Units Subcutaneous TID WC Cobos, Myer Peer, MD   10 Units at 12/16/17 [phone]  . insulin glargine (LANTUS) injection 25 Units  25 Units Subcutaneous Daily Rankin, Shuvon B, NP   25 Units at 12/16/17 0826  . lamoTRIgine (LAMICTAL) tablet 25 mg  25 mg Oral BID Sharma Covert, MD   25 mg at 12/16/17 0825  . metFORMIN (GLUCOPHAGE) tablet 1,000 mg  1,000 mg Oral BID WC Rankin, Shuvon B, NP   1,000 mg at 12/16/17 0825  . pantoprazole (PROTONIX) EC tablet 40 mg  40 mg Oral BID Rankin, Shuvon B, NP   40 mg at 12/16/17 0825  . polyethylene glycol (MIRALAX / GLYCOLAX) packet 17 g  17 g Oral Daily Rankin, Shuvon B, NP      . QUEtiapine (SEROQUEL) tablet 50 mg  50 mg Oral QHS Sharma Covert, MD   50 mg at 12/15/17 2137  . traZODone (DESYREL) tablet 50 mg  50 mg Oral QHS,MR X 1 Lindon Romp A, NP       PTA Medications: Medications Prior to Admission  Medication Sig Dispense Refill Last Dose  . busPIRone (BUSPAR) 10 MG tablet Take 10 mg by mouth 3 (three) times daily.  1   . dapagliflozin propanediol (FARXIGA) 10 MG TABS tablet Take 10 mg by mouth daily. (Patient not taking: Reported on 03/31/2017) 30 tablet 5 Not Taking at Unknown time  . Exenatide ER  (BYDUREON BCISE) 2 MG/0.85ML AUIJ Inject 2 mg into the skin once a week. 4 pen 5 04/09/2017  . famotidine (PEPCID) 40 MG tablet Take 1 tablet (40 mg total) by mouth at bedtime.     . feeding supplement, GLUCERNA SHAKE, (GLUCERNA SHAKE) LIQD Take 237 mLs by mouth 3 (three) times daily with meals.  0   . FLUoxetine (PROZAC) 40 MG capsule Take 1 capsule (40 mg total) by mouth daily.  3   . insulin aspart (NOVOLOG) 100 UNIT/ML injection Inject 8 Units into the skin 3 (three) times daily with meals. 10 mL 11   . insulin glargine (LANTUS) 100 UNIT/ML injection Inject 0.25 mLs (25 Units total) into the skin daily. 10 mL 11   . lamoTRIgine (LAMICTAL) 200 MG tablet TAKE 200 MG BY MOUTH ONCE DAILY  1 04/13/2017 at Unknown time  . lithium carbonate (LITHOBID) 300 MG CR tablet Take 3 (900 MG) tablets by mouth daily  1 04/13/2017 at Unknown time  . metFORMIN (GLUCOPHAGE) 1000 MG tablet Take 1 tablet (1,000 mg total) by mouth 2 (two) times daily with a meal. 180 tablet 1 04/13/2017 at Unknown time  . pantoprazole (PROTONIX) 40 MG tablet Take 1 tablet (40 mg total) by mouth 2 (two) times daily.     Marland Kitchen  polyethylene glycol (MIRALAX / GLYCOLAX) packet Take 17 g by mouth daily. 14 each 0   . pramipexole (MIRAPEX) 0.25 MG tablet Take 0.75 mg by mouth at bedtime.       Patient Stressors: Financial difficulties Health problems Medication change or noncompliance Occupational concerns  Patient Strengths: Ability for insight Average or above average intelligence Capable of independent living General fund of knowledge  Treatment Modalities: Medication Management, Group therapy, Case management,  1 to 1 session with clinician, Psychoeducation, Recreational therapy.   Physician Treatment Plan for Primary Diagnosis: <principal problem not specified> Long Term Goal(s): Improvement in symptoms so as ready for discharge Improvement in symptoms so as ready for discharge   Short Term Goals: Ability to identify changes  in lifestyle to reduce recurrence of condition will improve Ability to identify and develop effective coping behaviors will improve Ability to identify changes in lifestyle to reduce recurrence of condition will improve Ability to verbalize feelings will improve Ability to disclose and discuss suicidal ideas Ability to demonstrate self-control will improve Ability to identify and develop effective coping behaviors will improve Ability to maintain clinical measurements within normal limits will improve  Medication Management: Evaluate patient's response, side effects, and tolerance of medication regimen.  Therapeutic Interventions: 1 to 1 sessions, Unit Group sessions and Medication administration.  Evaluation of Outcomes: Adequate for Discharge  Physician Treatment Plan for Secondary Diagnosis: Active Problems:   MDD (major depressive disorder), recurrent episode, severe ([place])  Long Term Goal(s): Improvement in symptoms so as ready for discharge Improvement in symptoms so as ready for discharge   Short Term Goals: Ability to identify changes in lifestyle to reduce recurrence of condition will improve Ability to identify and develop effective coping behaviors will improve Ability to identify changes in lifestyle to reduce recurrence of condition will improve Ability to verbalize feelings will improve Ability to disclose and discuss suicidal ideas Ability to demonstrate self-control will improve Ability to identify and develop effective coping behaviors will improve Ability to maintain clinical measurements within normal limits will improve     Medication Management: Evaluate patient's response, side effects, and tolerance of medication regimen.  Therapeutic Interventions: 1 to 1 sessions, Unit Group sessions and Medication administration.  Evaluation of Outcomes: Adequate for Discharge   RN Treatment Plan for Primary Diagnosis: <principal problem not specified> Long Term Goal(s):  Knowledge of disease and therapeutic regimen to maintain health will improve  Short Term Goals: Ability to disclose and discuss suicidal ideas, Ability to identify and develop effective coping behaviors will improve and Compliance with prescribed medications will improve  Medication Management: RN will administer medications as ordered by provider, will assess and evaluate patient's response and provide education to patient for prescribed medication. RN will report any adverse and/or side effects to prescribing provider.  Therapeutic Interventions: 1 on 1 counseling sessions, Psychoeducation, Medication administration, Evaluate responses to treatment, Monitor vital signs and CBGs as ordered, Perform/monitor CIWA, COWS, AIMS and Fall Risk screenings as ordered, Perform wound care treatments as ordered.  Evaluation of Outcomes: Adequate for Discharge   LCSW Treatment Plan for Primary Diagnosis: <principal problem not specified> Long Term Goal(s): Safe transition to appropriate next level of care at discharge, Engage patient in therapeutic group addressing interpersonal concerns.  Short Term Goals: Engage patient in aftercare planning with referrals and resources  Therapeutic Interventions: Assess for all discharge needs, 1 to 1 time with Social worker, Explore available resources and support systems, Assess for adequacy in community support network, Educate family and significant  other(s) on suicide prevention, Complete Psychosocial Assessment, Interpersonal group therapy.  Evaluation of Outcomes: Adequate for Discharge   Progress in Treatment: Attending groups: Yes. Participating in groups: Yes. Taking medication as prescribed: Yes. Toleration medication: Yes. Family/Significant other contact made: No, will contact:  patient refuses consent Patient understands diagnosis: Yes. Discussing patient identified problems/goals with staff: Yes. Medical problems stabilized or resolved:  Yes. Denies suicidal/homicidal ideation: Yes. Issues/concerns per patient self-inventory: Yes. Other:   New problem(s) identified: None   New Short Term/Long Term Goal(s): medication stabilization, elimination of SI thoughts, development of comprehensive mental wellness plan.    Patient Goals:  "don't know what's going to help, I just hope something will"  Discharge Plan or Barriers: Patient is discharging [facility] for ECT with Dr. Weber Cooks.   Reason for Continuation of Hospitalization: Depression Medication stabilization Suicidal ideation  Estimated Length of Stay:3-5 days   Attendees: Patient: Marc Long  12/16/2017 11:24 AM  Physician: Dr. Neita Garnet, MD 12/16/2017 11:24 AM  Nursing: Chong Sicilian. D, RN 12/16/2017 11:24 AM  RN Care Manager: Rhunette Croft 12/16/2017 11:24 AM  Social Worker: Radonna Ricker, [place] 12/16/2017 11:24 AM  Recreational Therapist: Rhunette Croft 12/16/2017 11:24 AM  Other: X 12/16/2017 11:24 AM  Other: X 12/16/2017 11:24 AM  Other:x 12/16/2017 11:24 AM    Scribe for Treatment Team: Marylee Floras, [place] 12/16/2017 11:24 AM

## 2017-12-16 NOTE — Progress Notes (Signed)
Patient verbalizes readiness for discharge. Follow up plan explained, AVS, Transition record and SRA given. Prescriptions and teaching provided. Belongings returned and signed for. Suicide safety plan completed and signed. Patient verbalizes understanding. Patient denies SI/HI and assures this Probation officer he will seek assistance should that change. Patient discharged to lobby where [facility] was waiting to transport patient to [place] took report for this patient.

## 2017-12-17 ENCOUNTER — Inpatient Hospital Stay: Payer: Medicaid Other

## 2017-12-17 ENCOUNTER — Other Ambulatory Visit: Payer: Self-pay | Admitting: Psychiatry

## 2017-12-17 DIAGNOSIS — F332 Major depressive disorder, recurrent severe without psychotic features: Principal | ICD-10-CM

## 2017-12-17 DIAGNOSIS — Z01810 Encounter for preprocedural cardiovascular examination: Secondary | ICD-10-CM

## 2017-12-17 LAB — GLUCOSE, CAPILLARY
GLUCOSE-CAPILLARY: 107 mg/dL — AB (ref 70–99)
GLUCOSE-CAPILLARY: 91 mg/dL (ref 70–99)
Glucose-Capillary: 110 mg/dL — ABNORMAL HIGH (ref 70–99)
Glucose-Capillary: 73 mg/dL (ref 70–99)

## 2017-12-17 MED ORDER — INSULIN ASPART 100 UNIT/ML ~~LOC~~ SOLN
0.0000 [IU] | Freq: Three times a day (TID) | SUBCUTANEOUS | Status: DC
  Administered 2017-12-20: 2 [IU] via SUBCUTANEOUS
  Administered 2017-12-22 – 2017-12-23 (×2): 1 [IU] via SUBCUTANEOUS
  Filled 2017-12-17 (×2): qty 1

## 2017-12-17 MED ORDER — GLUCERNA SHAKE PO LIQD
237.0000 mL | Freq: Three times a day (TID) | ORAL | Status: DC
  Administered 2017-12-17 – 2017-12-24 (×20): 237 mL via ORAL

## 2017-12-17 MED ORDER — ADULT MULTIVITAMIN W/MINERALS CH
1.0000 | ORAL_TABLET | Freq: Every day | ORAL | Status: DC
  Administered 2017-12-17 – 2017-12-26 (×10): 1 via ORAL
  Filled 2017-12-17 (×10): qty 1

## 2017-12-17 NOTE — [facility] Suicide Risk Assessment (Signed)
[facility] Admission Suicide Risk Assessment   Nursing information obtained from:  Patient Demographic factors:  Male, Unemployed Current Mental Status:  Suicidal ideation indicated by patient, Self-harm thoughts Loss Factors:  Decline in physical health Historical Factors:  NA Risk Reduction Factors:  Positive coping skills or problem solving skills  Total Time spent with patient: 1 hour Principal Problem: Severe recurrent major depression without psychotic features ([place]) Diagnosis:   Patient Active Problem List   Diagnosis Date Noted  . Severe recurrent major depression without psychotic features ([place]) [F33.2] 12/16/2017  . MDD (major depressive disorder), recurrent episode, severe ([place]) [F33.2] 12/10/2017  . Obesity (BMI 30-39.9) [E66.9] 12/09/2017  . Depression with suicidal ideation [F32.9, R45.851]   . DKA (diabetic ketoacidoses) ([place]) [E13.10] 12/05/2017  . Hypertriglyceridemia [E78.1] 02/05/2017  . DM (diabetes mellitus), type 2, uncontrolled ([place]) [E11.65] 12/12/2016  . Dehydration [E86.0] 12/12/2016  . Suicidal ideations [R45.851] 12/12/2016  . Bipolar disorder (manic depression) ([place]) [F31.9] 12/12/2016  . Hyperglycemia due to type 2 diabetes mellitus ([place]) [E11.65] 12/12/2016  . AKI (acute kidney injury) ([place]) [N17.9] 12/12/2016  . DKA, type 2, not at goal [facility]) [E11.10] 12/12/2016  . Obesity, Class III, BMI 40-49.9 (morbid obesity) ([place]) [E66.01]   . Type 2 diabetes mellitus without complication, without long-term current use of insulin ([place]) [E11.9] 12/10/2016  . Lithium use [Z79.899] 12/10/2016  . Moderate single current episode of major depressive disorder ([place]) [F32.1] 04/15/2016  . Pain in joint, shoulder region [M25.519] 04/14/2016  . Diverticulosis of large intestine without hemorrhage [K57.30] 11/07/2015  . Calculus of gallbladder without cholecystitis [K80.20] 11/07/2015   Subjective Data: 42 year old man admitted to the hospital in transfer from [facility] because  of persistent depression.  Still has suicidal thoughts without specific intent in the hospital.  No evidence of acute psychosis.  Chronic anxiety and depression.  Continued Clinical Symptoms:  Alcohol Use Disorder Identification Test Final Score (AUDIT): 3 The "Alcohol Use Disorders Identification Test", Guidelines for Use in Primary Care, Second Edition.  World Pharmacologist [facility]). Score between 0-7:  no or low risk or alcohol related problems. Score between 8-15:  moderate risk of alcohol related problems. Score between 16-19:  high risk of alcohol related problems. Score 20 or above:  warrants further diagnostic evaluation for alcohol dependence and treatment.   CLINICAL FACTORS:   Depression:   Anhedonia   Musculoskeletal: Strength & Muscle Tone: within normal limits Gait & Station: normal Patient leans: N/A  Psychiatric Specialty Exam: Physical Exam  Nursing note and vitals reviewed. Constitutional: He appears well-developed and well-nourished.  HENT:  Head: Normocephalic and atraumatic.  Eyes: Pupils are equal, round, and reactive to light. Conjunctivae are normal.  Neck: Normal range of motion.  Cardiovascular: Regular rhythm and normal heart sounds.  Respiratory: Effort normal.  GI: Soft.  Musculoskeletal: Normal range of motion.  Neurological: He is alert.  Skin: Skin is warm and dry.  Psychiatric: Judgment normal. His affect is blunt. His speech is delayed. He is slowed. Cognition and memory are normal. He expresses suicidal ideation. He expresses suicidal plans.    Review of Systems  Constitutional: Negative.   HENT: Negative.   Eyes: Negative.   Respiratory: Negative.   Cardiovascular: Negative.   Gastrointestinal: Negative.   Musculoskeletal: Negative.   Skin: Negative.   Neurological: Negative.   Psychiatric/Behavioral: Positive for depression and suicidal ideas. Negative for hallucinations, memory loss and substance abuse. The patient is  nervous/anxious. The patient does not have insomnia.     Blood pressure  102/60, pulse (!) 106, temperature 98 F (36.7 C), temperature source Oral, resp. rate 16, height 5\' 11"  (1.803 m), weight 125.2 kg (276 lb), SpO2 93 %.Body mass index is 38.49 kg/m.  General Appearance: Fairly Groomed  Eye Contact:  Good  Speech:  Clear and Coherent  Volume:  Normal  Mood:  Dysphoric  Affect:  Congruent  Thought Process:  Coherent  Orientation:  Full (Time, Place, and Person)  Thought Content:  Logical  Suicidal Thoughts:  Yes.  without intent/plan  Homicidal Thoughts:  No  Memory:  Immediate;   Fair Recent;   Fair Remote;   Fair  Judgement:  Fair  Insight:  Fair  Psychomotor Activity:  Decreased  Concentration:  Concentration: Fair  Recall:  [employer] of Knowledge:  Fair  Language:  Fair  Akathisia:  No  Handed:  Right  AIMS (if indicated):     Assets:  Desire for Improvement Resilience  ADL's:  Intact  Cognition:  WNL  Sleep:  Number of Hours: 6.15      COGNITIVE FEATURES THAT CONTRIBUTE TO RISK:  Closed-mindedness    SUICIDE RISK:   Mild:  Suicidal ideation of limited frequency, intensity, duration, and specificity.  There are no identifiable plans, no associated intent, mild dysphoria and related symptoms, good self-control (both objective and subjective assessment), few other risk factors, and identifiable protective factors, including available and accessible social support.  PLAN OF CARE: Patient admitted to the hospital with intention of beginning ECT.  Reviewed treatment plan with patient.  Continue 15-minute checks.  ECT treatment beginning tomorrow along with medicine for depression and daily therapy.  Review of symptoms including suicidality prior to discharge  I certify that inpatient services furnished can reasonably be expected to improve the patient's condition.   Alethia Berthold, MD 12/17/2017, 2:00 PM

## 2017-12-17 NOTE — Progress Notes (Signed)
Patient alert and oriented x 4, no distress noted, affect is blunted, mood is pleasant , denies SI/HI/AVH. Patient's thoughts are organized and coherent, he appears less anxious and he is interacting appropriately with peers and staff. Patient was given support and encouraged to attend evening wrap up group.  Patient attends evening wrap up group, minimal interaction with peers and staff. 15 minutes check maintained will continue to monitor.

## 2017-12-17 NOTE — H&P (Signed)
Psychiatric Admission Assessment Adult  Patient Identification: Marc Long MRN:  [id number] Date of Evaluation:  12/17/2017 Chief Complaint:  Depression Principal Diagnosis: Severe recurrent major depression without psychotic features [facility]) Diagnosis:   Patient Active Problem List   Diagnosis Date Noted  . Severe recurrent major depression without psychotic features ([place]) [F33.2] 12/16/2017  . MDD (major depressive disorder), recurrent episode, severe ([place]) [F33.2] 12/10/2017  . Obesity (BMI 30-39.9) [E66.9] 12/09/2017  . Depression with suicidal ideation [F32.9, R45.851]   . DKA (diabetic ketoacidoses) ([place]) [E13.10] 12/05/2017  . Hypertriglyceridemia [E78.1] 02/05/2017  . DM (diabetes mellitus), type 2, uncontrolled ([place]) [E11.65] 12/12/2016  . Dehydration [E86.0] 12/12/2016  . Suicidal ideations [R45.851] 12/12/2016  . Bipolar disorder (manic depression) ([place]) [F31.9] 12/12/2016  . Hyperglycemia due to type 2 diabetes mellitus ([place]) [E11.65] 12/12/2016  . AKI (acute kidney injury) ([place]) [N17.9] 12/12/2016  . DKA, type 2, not at goal [facility]) [E11.10] 12/12/2016  . Obesity, Class III, BMI 40-49.9 (morbid obesity) ([place]) [E66.01]   . Type 2 diabetes mellitus without complication, without long-term current use of insulin ([place]) [E11.9] 12/10/2016  . Lithium use [Z79.899] 12/10/2016  . Moderate single current episode of major depressive disorder ([place]) [F32.1] 04/15/2016  . Pain in joint, shoulder region [M25.519] 04/14/2016  . Diverticulosis of large intestine without hemorrhage [K57.30] 11/07/2015  . Calculus of gallbladder without cholecystitis [K80.20] 11/07/2015   History of Present Illness: 42 year old man admitted in [facility] for [facility].  Patient was admitted to the hospital there after presenting with diabetic ketoacidosis.  Patient says he had stopped taking all of his diabetes medicines for about a month with the hope that he would die.  He says  he was doing this because he thought it would be easier on his family that if he were to do something more active such as shooting himself.  He says he is continuing to have some suicidal thought.  He has felt depressed for the last couple years but it has been worse since January.  Hopeless a lot of the time.  Energy level low.  Very negative about himself.  He describes himself now as having "images" as being his chief complaint.  He says he frequently during the day gets these intrusive thoughts about doing something violent to himself such as stabbing himself although he does not have any specific intention of acting on it.  They are distressing to him and cause anxiety.  Some difficulty sleeping.  Currently having normal appetite currently cooperative with medicine.  No other report of any hallucinations.  Not abusing any drugs or alcohol currently or before inpatient treatment.  Currently on medications for mood stabilization and depression.  Had not been getting better at the hospital in [facility] prompting a consideration of electroconvulsive therapy. Associated Signs/Symptoms: Depression Symptoms:  depressed mood, feelings of worthlessness/guilt, hopelessness, (Hypo) Manic Symptoms:  Irritable Mood, Anxiety Symptoms:  Agoraphobia, Excessive Worry, Psychotic Symptoms:  Nothing that I would call psychotic PTSD Symptoms: Negative Total Time spent with patient: 1 hour  Past Psychiatric History: No previous psychiatric hospitalizations.  Had seen outpatient psychiatrist and therapist in the community.  Prior medicines include lithium Lamictal Ativan Abilify Zoloft Paxil risk Toradol.  He found the antidepressants and antipsychotics especially the current Seroquel somewhat helpful.  Patient has no history of more active suicide attempts.  No history of violence.  He does give a history of long-standing anxiety about intrusive violent sexual fantasies although he has no intention currently of acting  on them.  Is the patient at risk to self? Yes.    Has the patient been a risk to self in the past 6 months? Yes.    Has the patient been a risk to self within the distant past? Yes.    Is the patient a risk to others? No.  Has the patient been a risk to others in the past 6 months? No.  Has the patient been a risk to others within the distant past? No.   Prior Inpatient Therapy:   Prior Outpatient Therapy:    Alcohol Screening: 1. How often do you have a drink containing alcohol?: Monthly or less 2. How many drinks containing alcohol do you have on a typical day when you are drinking?: 3 or 4 3. How often do you have six or more drinks on one occasion?: Never AUDIT-C Score: 2 4. How often during the last year have you found that you were not able to stop drinking once you had started?: Never 5. How often during the last year have you failed to do what was normally expected from you becasue of drinking?: Never 6. How often during the last year have you needed a first drink in the morning to get yourself going after a heavy drinking session?: Never 7. How often during the last year have you had a feeling of guilt of remorse after drinking?: Less than monthly 8. How often during the last year have you been unable to remember what happened the night before because you had been drinking?: Never 9. Have you or someone else been injured as a result of your drinking?: No 10. Has a relative or friend or a doctor or another health worker been concerned about your drinking or suggested you cut down?: No Alcohol Use Disorder Identification Test Final Score (AUDIT): 3 Intervention/Follow-up: AUDIT Score <7 follow-up not indicated, Continued Monitoring Substance Abuse History in the last 12 months:  No. Consequences of Substance Abuse: Medical Consequences:  Major consequences towards his health with diabetic ketoacidosis. Previous Psychotropic Medications: Yes  Psychological Evaluations: Yes  Past  Medical History:  Past Medical History:  Diagnosis Date  . Depression   . Diabetes mellitus without complication ([place])   . Gallstones   . Obesity, Class III, BMI 40-49.9 (morbid obesity) ([place])    History reviewed. No pertinent surgical history. Family History:  Family History  Problem Relation Age of Onset  . Hypertension Mother   . Depression Mother   . Heart attack Maternal Grandfather   . Lymphoma Maternal Grandmother   . Diabetes Maternal Aunt   . Cancer Maternal Aunt    Family Psychiatric  History: Family history positive for depression in his mother and brother Tobacco Screening: Have you used any form of tobacco in the last 30 days? (Cigarettes, Smokeless Tobacco, Cigars, and/or Pipes): No Social History:  Social History   Substance and Sexual Activity  Alcohol Use Yes  . Alcohol/week: 0.0 oz   Comment: once every 2 weeks. Wine coolers     Social History   Substance and Sexual Activity  Drug Use No    Additional Social History:                           Allergies:   Allergies  Allergen Reactions  . Bee Venom Swelling  . Keflex [Cephalexin]     UPSET STOMACH  . Relafen [Nabumetone]     Blood in stool  . Shellfish Allergy Nausea  Only   Lab Results:  Results for orders placed or performed during the hospital encounter of 12/16/17 (from the past 48 hour(s))  Glucose, capillary     Status: Abnormal   Collection Time: 12/16/17  6:05 PM  Result Value Ref Range   Glucose-Capillary 119 (H) 70 - 99 mg/dL   Comment 1 Notify RN   Glucose, capillary     Status: Abnormal   Collection Time: 12/17/17  7:12 AM  Result Value Ref Range   Glucose-Capillary 107 (H) 70 - 99 mg/dL   Comment 1 Notify RN   Glucose, capillary     Status: Abnormal   Collection Time: 12/17/17 12:14 PM  Result Value Ref Range   Glucose-Capillary 110 (H) 70 - 99 mg/dL    Blood Alcohol level:  Lab Results  Component Value Date   ETH <10 70/26/3785    Metabolic Disorder Labs:   Lab Results  Component Value Date   HGBA1C 16.7 (H) 12/09/2017   MPG 432.59 12/09/2017   No results found for: PROLACTIN Lab Results  Component Value Date   CHOL 142 03/05/2017   TRIG 148 03/05/2017   HDL 27 (L) 03/05/2017   CHOLHDL 5.3 (H) 03/05/2017   LDLCALC 85 03/05/2017   LDLCALC Comment 12/10/2016    Current Medications: Current Facility-Administered Medications  Medication Dose Route Frequency Provider Last Rate Last Dose  . acetaminophen (TYLENOL) tablet 650 mg  650 mg Oral Q6H PRN Tiffanyann Deroo, Haneef T, MD      . alum & mag hydroxide-simeth (MAALOX/MYLANTA) 200-200-20 MG/5ML suspension 30 mL  30 mL Oral Q4H PRN Zephaniah Lubrano, Blakely T, MD      . busPIRone (BUSPAR) tablet 10 mg  10 mg Oral TID Traycen Goyer, Madie Reno, MD   10 mg at 12/17/17 1219  . feeding supplement (GLUCERNA SHAKE) (GLUCERNA SHAKE) liquid 237 mL  237 mL Oral TID BM Tyleek Smick, Arek T, MD   237 mL at 12/17/17 1000  . FLUoxetine (PROZAC) capsule 40 mg  40 mg Oral Daily Nefertiti Mohamad, Madie Reno, MD   40 mg at 12/17/17 8850  . hydrocortisone cream 1 %   Topical BID PRN Monque Haggar, Madie Reno, MD      . hydrOXYzine (ATARAX/VISTARIL) tablet 50 mg  50 mg Oral TID PRN Merry Pond, Dantre T, MD      . insulin aspart (novoLOG) injection 0-9 Units  0-9 Units Subcutaneous TID WC Mariya Mottley, Duncan T, MD      . insulin aspart (novoLOG) injection 10 Units  10 Units Subcutaneous TID WC Miniya Miguez, Madie Reno, MD   10 Units at 12/17/17 1219  . insulin glargine (LANTUS) injection 25 Units  25 Units Subcutaneous Daily Cap Massi, Madie Reno, MD   25 Units at 12/17/17 [phone]  . lamoTRIgine (LAMICTAL) tablet 25 mg  25 mg Oral BID Debrina Kizer, Madie Reno, MD   25 mg at 12/17/17 1287  . magnesium hydroxide (MILK OF MAGNESIA) suspension 30 mL  30 mL Oral Daily PRN Suellen Durocher, Escher T, MD      . metFORMIN (GLUCOPHAGE) tablet 1,000 mg  1,000 mg Oral BID WC Joao Mccurdy, Madie Reno, MD   1,000 mg at 12/17/17 8676  . multivitamin with minerals tablet 1 tablet  1 tablet Oral Daily Shakeira Rhee, Madie Reno, MD   1 tablet at  12/17/17 1219  . pantoprazole (PROTONIX) EC tablet 40 mg  40 mg Oral Daily Raechell Singleton, Madie Reno, MD   40 mg at 12/17/17 7209  . polyethylene glycol (MIRALAX / GLYCOLAX) packet 17 g  17 g  Oral Daily Yvonna Brun, Madie Reno, MD   17 g at 12/16/17 1840  . QUEtiapine (SEROQUEL) tablet 50 mg  50 mg Oral QHS Maliek Schellhorn, Madie Reno, MD   50 mg at 12/16/17 2209  . traZODone (DESYREL) tablet 100 mg  100 mg Oral QHS PRN Nan Maya, Mamadou T, MD      . traZODone (DESYREL) tablet 50 mg  50 mg Oral QHS Keyasia Jolliff, Madie Reno, MD   50 mg at 12/16/17 2209   PTA Medications: Medications Prior to Admission  Medication Sig Dispense Refill Last Dose  . busPIRone (BUSPAR) 10 MG tablet Take 10 mg by mouth 3 (three) times daily.  1   . FLUoxetine (PROZAC) 40 MG capsule Take 1 capsule (40 mg total) by mouth daily.  3   . hydrocortisone cream 1 % Apply topically 2 (two) times daily as needed for itching. 30 g 0   . insulin aspart (NOVOLOG) 100 UNIT/ML injection Inject 10 Units into the skin 3 (three) times daily with meals. 10 mL 11   . insulin glargine (LANTUS) 100 UNIT/ML injection Inject 0.25 mLs (25 Units total) into the skin daily. 10 mL 11   . lamoTRIgine (LAMICTAL) 25 MG tablet Take 1 tablet (25 mg total) by mouth 2 (two) times daily.     . metFORMIN (GLUCOPHAGE) 1000 MG tablet Take 1 tablet (1,000 mg total) by mouth 2 (two) times daily with a meal. 180 tablet 1 04/13/2017 at Unknown time  . pantoprazole (PROTONIX) 40 MG tablet Take 1 tablet (40 mg total) by mouth 2 (two) times daily.     . polyethylene glycol (MIRALAX / GLYCOLAX) packet Take 17 g by mouth daily. 14 each 0   . QUEtiapine (SEROQUEL) 50 MG tablet Take 1 tablet (50 mg total) by mouth at bedtime. For sleep and mood     . traZODone (DESYREL) 50 MG tablet Take 1 tablet (50 mg total) by mouth at bedtime and may repeat dose one time if needed.       Musculoskeletal: Strength & Muscle Tone: within normal limits Gait & Station: normal Patient leans: N/A  Psychiatric Specialty  Exam: Physical Exam  Nursing note and vitals reviewed. Constitutional: He appears well-developed and well-nourished.  HENT:  Head: Normocephalic and atraumatic.  Eyes: Pupils are equal, round, and reactive to light. Conjunctivae are normal.  Neck: Normal range of motion.  Cardiovascular: Regular rhythm and normal heart sounds.  Respiratory: Effort normal. No respiratory distress.  GI: Soft.  Musculoskeletal: Normal range of motion.  Neurological: He is alert.  Skin: Skin is warm and dry.  Psychiatric: Judgment normal. His affect is blunt. His speech is delayed. He is slowed. Cognition and memory are normal. He exhibits a depressed mood. He expresses suicidal ideation.    Review of Systems  Constitutional: Negative.   HENT: Negative.   Eyes: Negative.   Respiratory: Negative.   Cardiovascular: Negative.   Gastrointestinal: Negative.   Musculoskeletal: Negative.   Skin: Negative.   Neurological: Negative.   Psychiatric/Behavioral: Positive for depression and suicidal ideas. Negative for hallucinations, memory loss and substance abuse. The patient is not nervous/anxious and does not have insomnia.     Blood pressure 102/60, pulse (!) 106, temperature 98 F (36.7 C), temperature source Oral, resp. rate 16, height '5\' 11"'$  (1.803 m), weight 125.2 kg (276 lb), SpO2 93 %.Body mass index is 38.49 kg/m.  General Appearance: Casual  Eye Contact:  Good  Speech:  Clear and Coherent  Volume:  Normal  Mood:  Anxious  and Depressed  Affect:  Congruent  Thought Process:  Goal Directed  Orientation:  Full (Time, Place, and Person)  Thought Content:  Logical  Suicidal Thoughts:  Yes.  without intent/plan  Homicidal Thoughts:  No  Memory:  Immediate;   Fair Recent;   Fair Remote;   Fair  Judgement:  Fair  Insight:  Fair  Psychomotor Activity:  Decreased  Concentration:  Concentration: Fair  Recall:  [employer] of Knowledge:  Fair  Language:  Fair  Akathisia:  No  Handed:  Right  AIMS  (if indicated):     Assets:  Desire for Improvement Housing Resilience  ADL's:  Intact  Cognition:  WNL  Sleep:  Number of Hours: 6.15    Treatment Plan Summary: Daily contact with patient to assess and evaluate symptoms and progress in treatment, Medication management and Plan 42 year old man admitted to the hospital here for ECT.  Patient meets criteria based on diagnosis of severe major depression recurrent.  Also failure to respond adequately to current medicine with some ongoing suicidal ideation and extremely poor functioning outside the hospital.  Patient has no medical contraindications to ECT.  We spent time going over the treatment and the patient was given adequate opportunity to ask questions.  Side effects and risks and potential benefits were discussed.  Patient is agreeable to the plan and we will plan to begin right unilateral ECT treatment tomorrow.  I have ordered a repeat of his basic metabolic panel and EKG and a chest x-ray.  Orders will be done to start treatment tomorrow.  No current change to medicine other than to hold his blood sugar medicine first thing in the morning.  Patient agrees to plan.  Patient met with full treatment team and is agreeable to the plan with them and will contact social work and nursing for any needs as they arise.  Observation Level/Precautions:  15 minute checks  Laboratory:  Chemistry Profile  Psychotherapy:    Medications:    Consultations:    Discharge Concerns:    Estimated LOS:  Other:     Physician Treatment Plan for Primary Diagnosis: Severe recurrent major depression without psychotic features ([place]) Long Term Goal(s): Improvement in symptoms so as ready for discharge  Short Term Goals: Ability to verbalize feelings will improve and Ability to disclose and discuss suicidal ideas  Physician Treatment Plan for Secondary Diagnosis: Principal Problem:   Severe recurrent major depression without psychotic features ([place]) Active  Problems:   Type 2 diabetes mellitus without complication, without long-term current use of insulin ([place])  Long Term Goal(s): Improvement in symptoms so as ready for discharge  Short Term Goals: Ability to identify and develop effective coping behaviors will improve and Ability to maintain clinical measurements within normal limits will improve  I certify that inpatient services furnished can reasonably be expected to improve the patient's condition.    Alethia Berthold, MD 7/4/20192:02 PM

## 2017-12-17 NOTE — Progress Notes (Signed)
Initial Nutrition Assessment  DOCUMENTATION CODES:   Obesity unspecified  INTERVENTION:   Glucerna Shake po TID, each supplement provides 220 kcal and 10 grams of protein  Double protein with meals   MVI daily  Pt at risk for refeeding, noted to have low phosphorus on 6/27; would recommend rechecking a K, Mg, and P on this pt.   NUTRITION DIAGNOSIS:   Unintentional weight loss related to chronic illness(uncontrolled DM) as evidenced by 15 percent weight loss in 8 months.  GOAL:   Patient will meet greater than or equal to 90% of their needs  MONITOR:   PO intake, Supplement acceptance  REASON FOR ASSESSMENT:   Malnutrition Screening Tool    ASSESSMENT:   42 year old male with past psychiatric history significant for major depression, recurrent, severe and recent DKA   Pt familiar to nutrition department from previous admit to [facility] for DKA and gallstones. Pt reported at that time poor appetite and oral intake for 3 weeks. Per chart, pt has lost 47lbs(15%) in 8 months which is significant for the time frame. Pt believes weight loss is r/t a change in his medication regimen at home. RD suspects wt loss partly from pt's uncontrolled DM. RD will order supplements to help pt meet his estimated needs. Pt currently eating 100% of meals in hospital. Pt at risk for refeeding, noted to have low phosphorus on 6/27; would recommend rechecking a K, Mg, and P on this pt.   Medications reviewed and include: insulin, metformin, protonix, miralax  Labs reviewed: cbgs- 127, 119, 107 x 24 hrs AIC 16.7(H)- 6/26  Diet Order:   Diet Order           Diet Carb Modified Fluid consistency: Thin; Room service appropriate? Yes  Diet effective now         EDUCATION NEEDS:   No education needs have been identified at this time  Skin:  Skin Assessment: Reviewed RN Assessment  Last BM:  Unknown   Height:   Ht Readings from Last 1 Encounters:  12/16/17 5\' 11"  (1.803 m)    Weight:    Wt Readings from Last 1 Encounters:  12/16/17 276 lb (125.2 kg)    Ideal Body Weight:  78 kg  BMI:  Body mass index is 38.49 kg/m.  Estimated Nutritional Needs:   Kcal:  2500-2800kcal/day   Protein:  100-124g/day   Fluid:  >2.3L/day   Koleen Distance MS, RD, LDN Pager #- [phone] Office#- [phone] After Hours Pager: [phone]

## 2017-12-17 NOTE — Plan of Care (Signed)
Data: Patient is appropriate and cooperative to assessment. Patient endorses passive SI with a plan to "cut my throat" patient states " I am always having these thoughts, when ever anyone asks I always say yes, and let them know. Patient verbalizes agreement for safety. Patient has completed daily self inventory worksheet. Patient reports fair mood, is depressed in affect. Patient has no complaints and a pain rating of 0/10. Patient reports good sleep quality, appetite is good. Patient rates depression "7/10" , feelings of hopelessness "8/10" and anxiety "8/10" Patients goal for today is "get acclimated to the routine and the facility."  Action:  Q x 15 minute observation checks were completed for safety. Patient was provided with education on medications. Patient was offered support and encouragement. Patient was given scheduled medications. Patient  was encourage to attend groups, participate in unit activities and continue with plan of care.     Response: Patient is compliant with medications. Patient has no complaints at this time. Patient is receptive to treatment and safety maintained on unit.    Problem: Self-Concept: Goal: Ability to disclose and discuss suicidal ideas will improve Outcome: Progressing   Problem: Coping: Goal: Will verbalize feelings Outcome: Progressing   Problem: Safety: Goal: Ability to disclose and discuss suicidal ideas will improve Outcome: Progressing

## 2017-12-17 NOTE — [facility] Suicide Risk Assessment (Signed)
[place] INPATIENT:  Family/Significant Other Suicide Prevention Education  Suicide Prevention Education:  Patient Refusal for Family/Significant Other Suicide Prevention Education: The patient Marc Long has refused to provide written consent for family/significant other to be provided Family/Significant Other Suicide Prevention Education during admission and/or prior to discharge.  Physician notified.  Alden Hipp, LCSW 12/17/2017, 10:23 AM

## 2017-12-17 NOTE — Plan of Care (Signed)
  Problem: Education: Goal: Knowledge of [facility] Education information/materials will improve Outcome: Progressing   
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
## 2017-12-17 NOTE — Tx Team (Addendum)
Interdisciplinary Treatment and Diagnostic Plan Update  12/17/2017 Time of Session: [place] MRN: [id number]  Principal Diagnosis: <principal problem not specified>  Secondary Diagnoses: Active Problems:   Severe recurrent major depression without psychotic features (HCC)   Current Medications:  Current Facility-Administered Medications  Medication Dose Route Frequency Provider Last Rate Last Dose  . acetaminophen (TYLENOL) tablet 650 mg  650 mg Oral Q6H PRN Clapacs, Corydon T, MD      . alum & mag hydroxide-simeth (MAALOX/MYLANTA) 200-200-20 MG/5ML suspension 30 mL  30 mL Oral Q4H PRN Clapacs, Brandy T, MD      . busPIRone (BUSPAR) tablet 10 mg  10 mg Oral TID Clapacs, Madie Reno, MD   10 mg at 12/17/17 1219  . feeding supplement (GLUCERNA SHAKE) (GLUCERNA SHAKE) liquid 237 mL  237 mL Oral TID BM Clapacs, Manpreet T, MD   237 mL at 12/17/17 1000  . FLUoxetine (PROZAC) capsule 40 mg  40 mg Oral Daily Clapacs, Umberto T, MD   40 mg at 12/17/17 4098  . hydrocortisone cream 1 %   Topical BID PRN Clapacs, Lexander T, MD      . hydrOXYzine (ATARAX/VISTARIL) tablet 50 mg  50 mg Oral TID PRN Clapacs, Ifeoluwa T, MD      . insulin aspart (novoLOG) injection 10 Units  10 Units Subcutaneous TID WC Clapacs, Madie Reno, MD   10 Units at 12/17/17 1219  . insulin glargine (LANTUS) injection 25 Units  25 Units Subcutaneous Daily Clapacs, Madie Reno, MD   25 Units at 12/17/17 [phone]  . lamoTRIgine (LAMICTAL) tablet 25 mg  25 mg Oral BID Clapacs, Madie Reno, MD   25 mg at 12/17/17 4782  . magnesium hydroxide (MILK OF MAGNESIA) suspension 30 mL  30 mL Oral Daily PRN Clapacs, Juana T, MD      . metFORMIN (GLUCOPHAGE) tablet 1,000 mg  1,000 mg Oral BID WC Clapacs, Madie Reno, MD   1,000 mg at 12/17/17 9562  . multivitamin with minerals tablet 1 tablet  1 tablet Oral Daily Clapacs, Madie Reno, MD   1 tablet at 12/17/17 1219  . pantoprazole (PROTONIX) EC tablet 40 mg  40 mg Oral Daily Clapacs, Madie Reno, MD   40 mg at 12/17/17 1308  . polyethylene  glycol (MIRALAX / GLYCOLAX) packet 17 g  17 g Oral Daily Clapacs, Jaelyn T, MD   17 g at 12/16/17 1840  . QUEtiapine (SEROQUEL) tablet 50 mg  50 mg Oral QHS Clapacs, Madie Reno, MD   50 mg at 12/16/17 2209  . traZODone (DESYREL) tablet 100 mg  100 mg Oral QHS PRN Clapacs, Trueman T, MD      . traZODone (DESYREL) tablet 50 mg  50 mg Oral QHS Clapacs, Madie Reno, MD   50 mg at 12/16/17 2209   PTA Medications: Medications Prior to Admission  Medication Sig Dispense Refill Last Dose  . busPIRone (BUSPAR) 10 MG tablet Take 10 mg by mouth 3 (three) times daily.  1   . FLUoxetine (PROZAC) 40 MG capsule Take 1 capsule (40 mg total) by mouth daily.  3   . hydrocortisone cream 1 % Apply topically 2 (two) times daily as needed for itching. 30 g 0   . insulin aspart (NOVOLOG) 100 UNIT/ML injection Inject 10 Units into the skin 3 (three) times daily with meals. 10 mL 11   . insulin glargine (LANTUS) 100 UNIT/ML injection Inject 0.25 mLs (25 Units total) into the skin daily. 10 mL 11   .  lamoTRIgine (LAMICTAL) 25 MG tablet Take 1 tablet (25 mg total) by mouth 2 (two) times daily.     . metFORMIN (GLUCOPHAGE) 1000 MG tablet Take 1 tablet (1,000 mg total) by mouth 2 (two) times daily with a meal. 180 tablet 1 04/13/2017 at Unknown time  . pantoprazole (PROTONIX) 40 MG tablet Take 1 tablet (40 mg total) by mouth 2 (two) times daily.     . polyethylene glycol (MIRALAX / GLYCOLAX) packet Take 17 g by mouth daily. 14 each 0   . QUEtiapine (SEROQUEL) 50 MG tablet Take 1 tablet (50 mg total) by mouth at bedtime. For sleep and mood     . traZODone (DESYREL) 50 MG tablet Take 1 tablet (50 mg total) by mouth at bedtime and may repeat dose one time if needed.       Patient Stressors:    Patient Strengths:    Treatment Modalities: Medication Management, Group therapy, Case management,  1 to 1 session with clinician, Psychoeducation, Recreational therapy.   Physician Treatment Plan for Primary Diagnosis: <principal problem not  specified> Long Term Goal(s):     Short Term Goals:    Medication Management: Evaluate patient's response, side effects, and tolerance of medication regimen.  Therapeutic Interventions: 1 to 1 sessions, Unit Group sessions and Medication administration.  Evaluation of Outcomes: Progressing  Physician Treatment Plan for Secondary Diagnosis: Active Problems:   Severe recurrent major depression without psychotic features ([place])  Long Term Goal(s):     Short Term Goals:       Medication Management: Evaluate patient's response, side effects, and tolerance of medication regimen.  Therapeutic Interventions: 1 to 1 sessions, Unit Group sessions and Medication administration.  Evaluation of Outcomes: Progressing   RN Treatment Plan for Primary Diagnosis: <principal problem not specified> Long Term Goal(s): Knowledge of disease and therapeutic regimen to maintain health will improve  Short Term Goals: Ability to verbalize feelings will improve, Ability to identify and develop effective coping behaviors will improve and Compliance with prescribed medications will improve  Medication Management: RN will administer medications as ordered by provider, will assess and evaluate patient's response and provide education to patient for prescribed medication. RN will report any adverse and/or side effects to prescribing provider.  Therapeutic Interventions: 1 on 1 counseling sessions, Psychoeducation, Medication administration, Evaluate responses to treatment, Monitor vital signs and CBGs as ordered, Perform/monitor CIWA, COWS, AIMS and Fall Risk screenings as ordered, Perform wound care treatments as ordered.  Evaluation of Outcomes: Progressing   LCSW Treatment Plan for Primary Diagnosis: <principal problem not specified> Long Term Goal(s): Safe transition to appropriate next level of care at discharge, Engage patient in therapeutic group addressing interpersonal concerns.  Short Term Goals:  Engage patient in aftercare planning with referrals and resources, Increase emotional regulation and Increase skills for wellness and recovery  Therapeutic Interventions: Assess for all discharge needs, 1 to 1 time with Social worker, Explore available resources and support systems, Assess for adequacy in community support network, Educate family and significant other(s) on suicide prevention, Complete Psychosocial Assessment, Interpersonal group therapy.  Evaluation of Outcomes: Progressing   Progress in Treatment: Attending groups: Yes. Participating in groups: Yes. Taking medication as prescribed: Yes. Toleration medication: Yes. Family/Significant other contact made: No, will contact:  a support if pt provides consent. Patient understands diagnosis: Yes. Discussing patient identified problems/goals with staff: Yes. Medical problems stabilized or resolved: Yes. Denies suicidal/homicidal ideation: Yes. Issues/concerns per patient self-inventory: No. Other: None at this time.   New problem(s) identified:  No, Describe:  none at this time  New Short Term/Long Term Goal(s): Pt will report 0 SI by the time of discharge.   Patient Goals:  Pt reported his goal for treatment is to, "get rid of these images in my head and to work on my anger management."   Discharge Plan or Barriers: Pt will continue ECT while on BMU. Pt will be discharged home and will continue tx in the outpatient setting upon discharge.   Reason for Continuation of Hospitalization: Depression Hallucinations Medication stabilization   Estimated Length of Stay: 7 days  Recreational Therapy: Patient Stressors: N/A  Patient Goal: Patient will identify 3 positive coping skills to decrease depressive symptoms within 5 recreation therapy group sessions    Attendees: Patient: Marc Long 12/17/2017 12:34 PM  Physician: Dr. Weber Cooks, MD 12/17/2017 12:34 PM  Nursing:  12/17/2017 12:34 PM  RN Care Manager: 12/17/2017 12:34 PM   Social Worker: Alden Hipp, LCSW 12/17/2017 12:34 PM  Recreational Therapist:  12/17/2017 12:34 PM  Other:  12/17/2017 12:34 PM  Other:  12/17/2017 12:34 PM  Other: 12/17/2017 12:34 PM    Scribe for Treatment Team: Alden Hipp, LCSW 12/17/2017 12:34 PM

## 2017-12-17 NOTE — Progress Notes (Signed)
Informed on coming shift that patient still required EKG for ECT treatment. EKG was unable to be obtained on this shift.

## 2017-12-18 ENCOUNTER — Inpatient Hospital Stay: Payer: Medicaid Other

## 2017-12-18 ENCOUNTER — Inpatient Hospital Stay: Payer: Medicaid Other | Admitting: Registered Nurse

## 2017-12-18 LAB — GLUCOSE, CAPILLARY
GLUCOSE-CAPILLARY: 95 mg/dL (ref 70–99)
GLUCOSE-CAPILLARY: 88 mg/dL (ref 70–99)
GLUCOSE-CAPILLARY: 97 mg/dL (ref 70–99)
Glucose-Capillary: 69 mg/dL — ABNORMAL LOW (ref 70–99)
Glucose-Capillary: 130 mg/dL — ABNORMAL HIGH (ref 70–99)

## 2017-12-18 MED ORDER — MIDAZOLAM HCL 2 MG/2ML IJ SOLN
INTRAMUSCULAR | Status: AC
  Filled 2017-12-18: qty 2

## 2017-12-18 MED ORDER — MIDAZOLAM HCL 2 MG/2ML IJ SOLN
INTRAMUSCULAR | Status: DC | PRN
  Administered 2017-12-18: 2 mg via INTRAVENOUS

## 2017-12-18 MED ORDER — MIDAZOLAM HCL 2 MG/2ML IJ SOLN: 2.0000 mg | Freq: Once | INTRAMUSCULAR | Status: DC

## 2017-12-18 MED ORDER — SUCCINYLCHOLINE CHLORIDE 20 MG/ML IJ SOLN
INTRAMUSCULAR | Status: DC | PRN
  Administered 2017-12-18: 140 mg via INTRAVENOUS

## 2017-12-18 MED ORDER — SODIUM CHLORIDE 0.9 % IV SOLN
INTRAVENOUS | Status: DC | PRN
  Administered 2017-12-18: 09:00:00 via INTRAVENOUS

## 2017-12-18 MED ORDER — SODIUM CHLORIDE 0.9 % IV SOLN
500.0000 mL | Freq: Once | INTRAVENOUS | Status: AC
  Administered 2017-12-18: 500 mL via INTRAVENOUS

## 2017-12-18 MED ORDER — IBUPROFEN 600 MG PO TABS
600.0000 mg | ORAL_TABLET | Freq: Four times a day (QID) | ORAL | Status: DC | PRN
  Administered 2017-12-18 – 2017-12-20 (×5): 600 mg via ORAL
  Filled 2017-12-18 (×5): qty 1

## 2017-12-18 MED ORDER — METHOHEXITAL SODIUM 100 MG/10ML IV SOSY
PREFILLED_SYRINGE | INTRAVENOUS | Status: DC | PRN
  Administered 2017-12-18: 120 mg via INTRAVENOUS

## 2017-12-18 MED ORDER — ONDANSETRON HCL 4 MG/2ML IJ SOLN: 4.0000 mg | Freq: Once | INTRAMUSCULAR | Status: DC | PRN

## 2017-12-18 NOTE — H&P (Signed)
Marc Long is an 42 y.o. male.   Chief Complaint: Patient complains of chronically depressed sad and nervous mood with social withdrawal and lack of motivation HPI: Diagnosis of depression as well as anxiety disorder  Past Medical History:  Diagnosis Date  . Depression   . Diabetes mellitus without complication ([place])   . Gallstones   . Obesity, Class III, BMI 40-49.9 (morbid obesity) ([place])     History reviewed. No pertinent surgical history.  Family History  Problem Relation Age of Onset  . Hypertension Mother   . Depression Mother   . Heart attack Maternal Grandfather   . Lymphoma Maternal Grandmother   . Diabetes Maternal Aunt   . Cancer Maternal Aunt    Social History:  reports that he has never smoked. He has never used smokeless tobacco. He reports that he drinks alcohol. He reports that he does not use drugs.  Allergies:  Allergies  Allergen Reactions  . Bee Venom Swelling  . Keflex [Cephalexin]     UPSET STOMACH  . Relafen [Nabumetone]     Blood in stool  . Shellfish Allergy Nausea Only    Medications Prior to Admission  Medication Sig Dispense Refill  . busPIRone (BUSPAR) 10 MG tablet Take 10 mg by mouth 3 (three) times daily.  1  . FLUoxetine (PROZAC) 40 MG capsule Take 1 capsule (40 mg total) by mouth daily.  3  . hydrocortisone cream 1 % Apply topically 2 (two) times daily as needed for itching. 30 g 0  . insulin aspart (NOVOLOG) 100 UNIT/ML injection Inject 10 Units into the skin 3 (three) times daily with meals. 10 mL 11  . insulin glargine (LANTUS) 100 UNIT/ML injection Inject 0.25 mLs (25 Units total) into the skin daily. 10 mL 11  . lamoTRIgine (LAMICTAL) 25 MG tablet Take 1 tablet (25 mg total) by mouth 2 (two) times daily.    . metFORMIN (GLUCOPHAGE) 1000 MG tablet Take 1 tablet (1,000 mg total) by mouth 2 (two) times daily with a meal. 180 tablet 1  . pantoprazole (PROTONIX) 40 MG tablet Take 1 tablet (40 mg total) by mouth 2 (two) times daily.    .  polyethylene glycol (MIRALAX / GLYCOLAX) packet Take 17 g by mouth daily. 14 each 0  . QUEtiapine (SEROQUEL) 50 MG tablet Take 1 tablet (50 mg total) by mouth at bedtime. For sleep and mood    . traZODone (DESYREL) 50 MG tablet Take 1 tablet (50 mg total) by mouth at bedtime and may repeat dose one time if needed.      Results for orders placed or performed during the hospital encounter of 12/16/17 (from the past 48 hour(s))  Glucose, capillary     Status: Abnormal   Collection Time: 12/16/17  6:05 PM  Result Value Ref Range   Glucose-Capillary 119 (H) 70 - 99 mg/dL   Comment 1 Notify RN   Glucose, capillary     Status: Abnormal   Collection Time: 12/17/17  7:12 AM  Result Value Ref Range   Glucose-Capillary 107 (H) 70 - 99 mg/dL   Comment 1 Notify RN   Glucose, capillary     Status: Abnormal   Collection Time: 12/17/17 12:14 PM  Result Value Ref Range   Glucose-Capillary 110 (H) 70 - 99 mg/dL  Glucose, capillary     Status: None   Collection Time: 12/17/17  4:15 PM  Result Value Ref Range   Glucose-Capillary 91 70 - 99 mg/dL  Glucose, capillary  Status: None   Collection Time: 12/17/17  9:02 PM  Result Value Ref Range   Glucose-Capillary 73 70 - 99 mg/dL  Glucose, capillary     Status: None   Collection Time: 12/18/17  6:59 AM  Result Value Ref Range   Glucose-Capillary 95 70 - 99 mg/dL   Comment 1 Notify RN    Dg Chest 2 View  Result Date: 12/17/2017 CLINICAL DATA:  No chest complaints at this time, diabetic, ECT treatment EXAM: CHEST - 2 VIEW COMPARISON:  12/06/2017 FINDINGS: The heart size and mediastinal contours are within normal limits. Both lungs are clear. No pleural effusion or pneumothorax. The visualized skeletal structures are unremarkable. IMPRESSION: No active cardiopulmonary disease. Electronically Signed   By: Lajean Manes M.D.   On: 12/17/2017 14:53    Review of Systems  Constitutional: Negative.   HENT: Negative.   Eyes: Negative.   Respiratory:  Negative.   Cardiovascular: Negative.   Gastrointestinal: Negative.   Musculoskeletal: Negative.   Skin: Negative.   Neurological: Negative.   Psychiatric/Behavioral: Positive for depression and suicidal ideas. Negative for hallucinations, memory loss and substance abuse. The patient is nervous/anxious. The patient does not have insomnia.     Blood pressure 124/78, pulse 80, temperature 98.6 F (37 C), resp. rate (!) 30, height 5\' 11"  (1.803 m), weight 124.3 kg (274 lb), SpO2 97 %. Physical Exam  Nursing note and vitals reviewed. Constitutional: He appears well-developed and well-nourished.  HENT:  Head: Normocephalic and atraumatic.  Eyes: Pupils are equal, round, and reactive to light. Conjunctivae are normal.  Neck: Normal range of motion.  Cardiovascular: Regular rhythm and normal heart sounds.  Respiratory: Effort normal.  GI: Soft.  Musculoskeletal: Normal range of motion.  Neurological: He is alert.  Skin: Skin is warm and dry.  Psychiatric: Judgment normal. His affect is blunt. He is slowed. Cognition and memory are normal. He expresses suicidal ideation. He expresses no suicidal plans.     Assessment/Plan ECT starting with right unilateral treatment moving forward on a 3 times a week schedule as tolerated beginning now with inpatient treatment.  Alethia Berthold, MD 12/18/2017, 10:07 AM

## 2017-12-18 NOTE — Progress Notes (Signed)
[facility] MD Progress Note  12/18/2017 2:07 PM Marc Long  MRN:  [id number] Subjective: Patient seen chart reviewed.  Patient had ECT this morning his first treatment was right unilateral treatment.  He required one stimulation but had a short and poorly organized seizure.  Seen this afternoon the patient is complaining of headache and some fatigue and soreness.  Mood remains about the same.  Passive suicidal ideation without intent or plan.  No aggression no agitation.  Basically cooperative with treatment.  Blood pressure stable.  Blood sugars stable. Principal Problem: Severe recurrent major depression without psychotic features ([place]) Diagnosis:   Patient Active Problem List   Diagnosis Date Noted  . Severe recurrent major depression without psychotic features ([place]) [F33.2] 12/16/2017  . MDD (major depressive disorder), recurrent episode, severe ([place]) [F33.2] 12/10/2017  . Obesity (BMI 30-39.9) [E66.9] 12/09/2017  . Depression with suicidal ideation [F32.9, R45.851]   . DKA (diabetic ketoacidoses) ([place]) [E13.10] 12/05/2017  . Hypertriglyceridemia [E78.1] 02/05/2017  . DM (diabetes mellitus), type 2, uncontrolled ([place]) [E11.65] 12/12/2016  . Dehydration [E86.0] 12/12/2016  . Suicidal ideations [R45.851] 12/12/2016  . Bipolar disorder (manic depression) ([place]) [F31.9] 12/12/2016  . Hyperglycemia due to type 2 diabetes mellitus ([place]) [E11.65] 12/12/2016  . AKI (acute kidney injury) ([place]) [N17.9] 12/12/2016  . DKA, type 2, not at goal [facility]) [E11.10] 12/12/2016  . Obesity, Class III, BMI 40-49.9 (morbid obesity) ([place]) [E66.01]   . Type 2 diabetes mellitus without complication, without long-term current use of insulin ([place]) [E11.9] 12/10/2016  . Lithium use [Z79.899] 12/10/2016  . Moderate single current episode of major depressive disorder ([place]) [F32.1] 04/15/2016  . Pain in joint, shoulder region [M25.519] 04/14/2016  . Diverticulosis of large intestine without hemorrhage [K57.30] 11/07/2015  .  Calculus of gallbladder without cholecystitis [K80.20] 11/07/2015   Total Time spent with patient: 30 minutes  Past Psychiatric History: Past history of recurrent depression with recent suicidality  Past Medical History:  Past Medical History:  Diagnosis Date  . Depression   . Diabetes mellitus without complication ([place])   . Gallstones   . Obesity, Class III, BMI 40-49.9 (morbid obesity) ([place])    History reviewed. No pertinent surgical history. Family History:  Family History  Problem Relation Age of Onset  . Hypertension Mother   . Depression Mother   . Heart attack Maternal Grandfather   . Lymphoma Maternal Grandmother   . Diabetes Maternal Aunt   . Cancer Maternal Aunt    Family Psychiatric  History: Positive for depression Social History:  Social History   Substance and Sexual Activity  Alcohol Use Yes  . Alcohol/week: 0.0 oz   Comment: once every 2 weeks. Wine coolers     Social History   Substance and Sexual Activity  Drug Use No    Social History   Socioeconomic History  . Marital status: Single    Spouse name: Not on file  . Number of children: 0  . Years of education: Not on file  . Highest education level: Not on file  Occupational History  . Occupation: call center  Social Needs  . Financial resource strain: Not on file  . Food insecurity:    Worry: Not on file    Inability: Not on file  . Transportation needs:    Medical: Not on file    Non-medical: Not on file  Tobacco Use  . Smoking status: Never Smoker  . Smokeless tobacco: Never Used  Substance and Sexual Activity  . Alcohol use: Yes  Alcohol/week: 0.0 oz    Comment: once every 2 weeks. Wine coolers  . Drug use: No  . Sexual activity: Not Currently    Birth control/protection: None  Lifestyle  . Physical activity:    Days per week: Not on file    Minutes per session: Not on file  . Stress: Not on file  Relationships  . Social connections:    Talks on phone: Not on file     Gets together: Not on file    Attends religious service: Not on file    Active member of club or organization: Not on file    Attends meetings of clubs or organizations: Not on file    Relationship status: Not on file  Other Topics Concern  . Not on file  Social History Narrative  . Not on file   Additional Social History:                         Sleep: Fair  Appetite:  Fair  Current Medications: Current Facility-Administered Medications  Medication Dose Route Frequency Provider Last Rate Last Dose  . acetaminophen (TYLENOL) tablet 650 mg  650 mg Oral Q6H PRN Nyeema Want, Madie Reno, MD   650 mg at 12/18/17 1347  . alum & mag hydroxide-simeth (MAALOX/MYLANTA) 200-200-20 MG/5ML suspension 30 mL  30 mL Oral Q4H PRN Joeanne Robicheaux, Renell T, MD      . busPIRone (BUSPAR) tablet 10 mg  10 mg Oral TID Yazir Koerber, Madie Reno, MD   10 mg at 12/18/17 1209  . feeding supplement (GLUCERNA SHAKE) (GLUCERNA SHAKE) liquid 237 mL  237 mL Oral TID BM Jahniah Pallas, Madie Reno, MD   237 mL at 12/17/17 2105  . FLUoxetine (PROZAC) capsule 40 mg  40 mg Oral Daily Jasminemarie Sherrard, Mica T, MD   40 mg at 12/18/17 1210  . hydrocortisone cream 1 %   Topical BID PRN Naithan Delage, Madie Reno, MD      . hydrOXYzine (ATARAX/VISTARIL) tablet 50 mg  50 mg Oral TID PRN Oasis Goehring, Hoyt T, MD      . insulin aspart (novoLOG) injection 0-9 Units  0-9 Units Subcutaneous TID WC Gaila Engebretsen, Anthonio T, MD      . insulin aspart (novoLOG) injection 10 Units  10 Units Subcutaneous TID WC Nyeli Holtmeyer, Madie Reno, MD   10 Units at 12/18/17 1209  . insulin glargine (LANTUS) injection 25 Units  25 Units Subcutaneous Daily Elysa Womac, Madie Reno, MD   25 Units at 12/18/17 1209  . magnesium hydroxide (MILK OF MAGNESIA) suspension 30 mL  30 mL Oral Daily PRN Tierany Appleby, Jamear T, MD      . metFORMIN (GLUCOPHAGE) tablet 1,000 mg  1,000 mg Oral BID WC Bakari Nikolai, Macauley T, MD   1,000 mg at 12/18/17 1210  . midazolam (VERSED) injection 2 mg  2 mg Intravenous Once Shaneen Reeser, Janari T, MD      . multivitamin with  minerals tablet 1 tablet  1 tablet Oral Daily Carlas Vandyne, Madie Reno, MD   1 tablet at 12/18/17 1210  . ondansetron (ZOFRAN) injection 4 mg  4 mg Intravenous Once PRN Gunnar Fusi, MD      . pantoprazole (PROTONIX) EC tablet 40 mg  40 mg Oral Daily Jasdeep Dejarnett, Madie Reno, MD   40 mg at 12/18/17 1210  . polyethylene glycol (MIRALAX / GLYCOLAX) packet 17 g  17 g Oral Daily Saud Bail, Dekendrick T, MD   17 g at 12/16/17 1840  . QUEtiapine (SEROQUEL) tablet 50 mg  50 mg Oral QHS Kizzy Olafson, Madie Reno, MD   50 mg at 12/17/17 2103  . traZODone (DESYREL) tablet 100 mg  100 mg Oral QHS PRN Delshon Blanchfield, Chucky T, MD      . traZODone (DESYREL) tablet 50 mg  50 mg Oral QHS Lashena Signer, Madie Reno, MD   50 mg at 12/17/17 2104    Lab Results:  Results for orders placed or performed during the hospital encounter of 12/16/17 (from the past 48 hour(s))  Glucose, capillary     Status: Abnormal   Collection Time: 12/16/17  6:05 PM  Result Value Ref Range   Glucose-Capillary 119 (H) 70 - 99 mg/dL   Comment 1 Notify RN   Glucose, capillary     Status: Abnormal   Collection Time: 12/17/17  7:12 AM  Result Value Ref Range   Glucose-Capillary 107 (H) 70 - 99 mg/dL   Comment 1 Notify RN   Glucose, capillary     Status: Abnormal   Collection Time: 12/17/17 12:14 PM  Result Value Ref Range   Glucose-Capillary 110 (H) 70 - 99 mg/dL  Glucose, capillary     Status: None   Collection Time: 12/17/17  4:15 PM  Result Value Ref Range   Glucose-Capillary 91 70 - 99 mg/dL  Glucose, capillary     Status: None   Collection Time: 12/17/17  9:02 PM  Result Value Ref Range   Glucose-Capillary 73 70 - 99 mg/dL  Glucose, capillary     Status: None   Collection Time: 12/18/17  6:59 AM  Result Value Ref Range   Glucose-Capillary 95 70 - 99 mg/dL   Comment 1 Notify RN   Glucose, capillary     Status: None   Collection Time: 12/18/17  8:46 AM  Result Value Ref Range   Glucose-Capillary 88 70 - 99 mg/dL  Glucose, capillary     Status: None   Collection  Time: 12/18/17 11:48 AM  Result Value Ref Range   Glucose-Capillary 97 70 - 99 mg/dL    Blood Alcohol level:  Lab Results  Component Value Date   ETH <10 38/18/2993    Metabolic Disorder Labs: Lab Results  Component Value Date   HGBA1C 16.7 (H) 12/09/2017   MPG 432.59 12/09/2017   No results found for: PROLACTIN Lab Results  Component Value Date   CHOL 142 03/05/2017   TRIG 148 03/05/2017   HDL 27 (L) 03/05/2017   CHOLHDL 5.3 (H) 03/05/2017   LDLCALC 85 03/05/2017   [place] Comment 12/10/2016    Physical Findings: AIMS:  , ,  ,  ,    CIWA:    COWS:     Musculoskeletal: Strength & Muscle Tone: within normal limits Gait & Station: normal Patient leans: N/A  Psychiatric Specialty Exam: Physical Exam  Nursing note and vitals reviewed. Constitutional: He appears well-developed and well-nourished.  HENT:  Head: Normocephalic and atraumatic.  Eyes: Pupils are equal, round, and reactive to light. Conjunctivae are normal.  Neck: Normal range of motion.  Cardiovascular: Regular rhythm and normal heart sounds.  Respiratory: Effort normal. No respiratory distress.  GI: Soft.  Musculoskeletal: Normal range of motion.  Neurological: He is alert.  Skin: Skin is warm and dry.  Psychiatric: Judgment normal. His affect is blunt. His speech is delayed. He is slowed. Cognition and memory are impaired. He expresses suicidal ideation. He expresses no suicidal plans.    Review of Systems  Constitutional: Negative.   HENT: Negative.   Eyes: Negative.   Respiratory:  Negative.   Cardiovascular: Negative.   Gastrointestinal: Negative.   Musculoskeletal: Negative.   Skin: Negative.   Neurological: Positive for headaches.  Psychiatric/Behavioral: Positive for depression and suicidal ideas. Negative for hallucinations, memory loss and substance abuse. The patient is nervous/anxious. The patient does not have insomnia.     Blood pressure 119/80, pulse 84, temperature (!) 97 F  (36.1 C), resp. rate 20, height 5\' 11"  (1.803 m), weight 124.3 kg (274 lb), SpO2 94 %.Body mass index is 38.22 kg/m.  General Appearance: Disheveled  Eye Contact:  Good  Speech:  Slow  Volume:  Decreased  Mood:  Depressed  Affect:  Congruent  Thought Process:  Goal Directed  Orientation:  Full (Time, Place, and Person)  Thought Content:  Logical  Suicidal Thoughts:  Yes.  without intent/plan  Homicidal Thoughts:  No  Memory:  Immediate;   Fair Recent;   Fair Remote;   Fair  Judgement:  Fair  Insight:  Fair  Psychomotor Activity:  Decreased  Concentration:  Concentration: Fair  Recall:  [employer] of Knowledge:  Fair  Language:  Fair  Akathisia:  No  Handed:  Right  AIMS (if indicated):     Assets:  Desire for Improvement  ADL's:  Intact  Cognition:  Impaired,  Mild  Sleep:  Number of Hours: 7.15     Treatment Plan Summary: Daily contact with patient to assess and evaluate symptoms and progress in treatment, Medication management and Plan Patient had ECT this morning although seizure was of inadequate quality.  I have discontinued his lamotrigine is unlikely to add to his treatment benefit but possibly impeding his seizures.  Continue other medicine including medicine for diabetes.  Added Motrin to assist with pain.  Supportive counseling and encouragement to attend groups.  Next ECT Monday.  Alethia Berthold, MD 12/18/2017, 2:07 PM

## 2017-12-18 NOTE — Transfer of Care (Signed)
Immediate Anesthesia Transfer of Care Note  Patient: Marc Long  Procedure(s) Performed: ECT TX  Patient Location: PACU  Anesthesia Type:General  Level of Consciousness: sedated  Airway & Oxygen Therapy: Patient Spontanous Breathing and Patient connected to face mask oxygen  Post-op Assessment: Report given to RN and Post -op Vital signs reviewed and stable  Post vital signs: Reviewed and stable  Last Vitals:  Vitals Value Taken Time  BP 124/78 12/18/2017 10:03 AM  Temp 37 C 12/18/2017 10:02 AM  Pulse 77 12/18/2017 10:03 AM  Resp 29 12/18/2017 10:03 AM  SpO2 97 % 12/18/2017 10:03 AM  Vitals shown include unvalidated device data.  Last Pain:  Vitals:   12/18/17 0839  TempSrc: Oral  PainSc:          Complications: No apparent anesthesia complications

## 2017-12-18 NOTE — Plan of Care (Signed)
Patient is complying with his medications and improving in socialization's with his peers and assertive , expresses no concerns , denies any SI/HI . Not hearing voices at this time , contract for safety of self and others, 15 minute safety checks is maintained, patient is sleeping long hours without any interruptions no distress. Problem: Education: Goal: Knowledge of [place] [facility] Education information/materials will improve Outcome: Progressing Goal: Emotional status will improve Outcome: Progressing Goal: Mental status will improve Outcome: Progressing Goal: Verbalization of understanding the information provided will improve Outcome: Progressing   Problem: Coping: Goal: Coping ability will improve Outcome: Progressing   Problem: Self-Concept: Goal: Ability to disclose and discuss suicidal ideas will improve Outcome: Progressing   Problem: Activity: Goal: Interest or engagement in leisure activities will improve Outcome: Progressing Goal: Imbalance in normal sleep/wake cycle will improve Outcome: Progressing   Problem: Self-Concept: Goal: Will verbalize positive feelings about self Outcome: Progressing Goal: Level of anxiety will decrease Outcome: Progressing   Problem: Education: Goal: Knowledge of [place] [facility] Education information/materials will improve Outcome: Progressing Goal: Emotional status will improve Outcome: Progressing Goal: Mental status will improve Outcome: Progressing Goal: Verbalization of understanding the information provided will improve Outcome: Progressing   Problem: Activity: Goal: Interest or engagement in activities will improve Outcome: Progressing Goal: Sleeping patterns will improve Outcome: Progressing   Problem: Coping: Goal: Ability to verbalize frustrations and anger appropriately will improve Outcome: Progressing Goal: Ability to demonstrate self-control will improve Outcome: Progressing   Problem: Health  Behavior/Discharge Planning: Goal: Identification of resources available to assist in meeting health care needs will improve Outcome: Progressing Goal: Compliance with treatment plan for underlying cause of condition will improve Outcome: Progressing   Problem: Physical Regulation: Goal: Ability to maintain clinical measurements within normal limits will improve Outcome: Progressing   Problem: Safety: Goal: Periods of time without injury will increase Outcome: Progressing   Problem: Education: Goal: Utilization of techniques to improve thought processes will improve Outcome: Progressing Goal: Knowledge of the prescribed therapeutic regimen will improve Outcome: Progressing   Problem: Activity: Goal: Interest or engagement in leisure activities will improve Outcome: Progressing Goal: Imbalance in normal sleep/wake cycle will improve Outcome: Progressing   Problem: Coping: Goal: Coping ability will improve Outcome: Progressing Goal: Will verbalize feelings Outcome: Progressing   Problem: Health Behavior/Discharge Planning: Goal: Ability to make decisions will improve Outcome: Progressing Goal: Compliance with therapeutic regimen will improve Outcome: Progressing   Problem: Role Relationship: Goal: Will demonstrate positive changes in social behaviors and relationships Outcome: Progressing   Problem: Safety: Goal: Ability to disclose and discuss suicidal ideas will improve Outcome: Progressing Goal: Ability to identify and utilize support systems that promote safety will improve Outcome: Progressing   Problem: Self-Concept: Goal: Will verbalize positive feelings about self Outcome: Progressing Goal: Level of anxiety will decrease Outcome: Progressing

## 2017-12-18 NOTE — Plan of Care (Signed)
Patient tolerated ECT well.Denies SI,HI and AVH.Visible in the milieu.Appropriate with staff and peers.Compliant with medications.Appetite and energy level good.Support and encouragement given.

## 2017-12-18 NOTE — Anesthesia Preprocedure Evaluation (Signed)
Anesthesia Evaluation  Patient identified by MRN, date of birth, ID band Patient awake    Reviewed: Allergy & Precautions, NPO status , Patient's Chart, lab work & pertinent test results  History of Anesthesia Complications Negative for: history of anesthetic complications  Airway Mallampati: III       Dental   Pulmonary neg sleep apnea, neg COPD,           Cardiovascular (-) hypertension(-) Past MI and (-) CHF (-) dysrhythmias (-) Valvular Problems/Murmurs     Neuro/Psych neg Seizures Depression Bipolar Disorder    GI/Hepatic Neg liver ROS, neg GERD (resolved with weight loss)  ,  Endo/Other  diabetes, Type 2, Oral Hypoglycemic Agents, Insulin DependentMorbid obesity  Renal/GU Renal disease     Musculoskeletal   Abdominal   Peds  Hematology   Anesthesia Other Findings   Reproductive/Obstetrics                             Anesthesia Physical Anesthesia Plan  ASA: III  Anesthesia Plan: General   Post-op Pain Management:    Induction: Intravenous  PONV Risk Score and Plan:   Airway Management Planned: Mask  Additional Equipment:   Intra-op Plan:   Post-operative Plan:   Informed Consent: I have reviewed the patients History and Physical, chart, labs and discussed the procedure including the risks, benefits and alternatives for the proposed anesthesia with the patient or authorized representative who has indicated his/her understanding and acceptance.     Plan Discussed with:   Anesthesia Plan Comments:         Anesthesia Quick Evaluation

## 2017-12-18 NOTE — Anesthesia Procedure Notes (Signed)
Performed by: Geraldyn Shain, CRNA Pre-anesthesia Checklist: Patient identified, Emergency Drugs available, Suction available and Patient being monitored Patient Re-evaluated:Patient Re-evaluated prior to induction Oxygen Delivery Method: Circle system utilized Preoxygenation: Pre-oxygenation with 100% oxygen Induction Type: IV induction Ventilation: Mask ventilation without difficulty and Mask ventilation throughout procedure Airway Equipment and Method: Bite block Placement Confirmation: positive ETCO2 Dental Injury: Teeth and Oropharynx as per pre-operative assessment        

## 2017-12-18 NOTE — Procedures (Signed)
ECT SERVICES Physician's Interval Evaluation & Treatment Note  Patient Identification: Pierson Vantol MRN:  [id number] Date of Evaluation:  12/18/2017 TX #: 1  MADRS: 24  MMSE: 30  P.E. Findings:  No significant physical exam difficulties no pulmonary or cardiac problems no musculoskeletal problems  Psychiatric Interval Note:  Subjective feelings of depression and anxiety.  Affect slightly blunted but reactive thoughts lucid no evidence psychosis  Subjective:  Patient is a 42 y.o. male seen for evaluation for Electroconvulsive Therapy. Patient reports himself as depressed and has passive suicidal thoughts.  Treatment Summary:   [x]   Right Unilateral             []  Bilateral   % Energy : 0.3 ms 40%   Impedance: 370 ohms  Seizure Energy Index: No reading  Postictal Suppression Index: No reading  Seizure Concordance Index: 32%  Medications  Pre Shock: Brevital 120 mg succinylcholine 140 mg  Post Shock: Versed 2 mg  Seizure Duration: 8 seconds by EMG probably only 8 seconds by EEG as well.  Poorly organized and short seizure.   Comments: Patient had significant secretions and we will give him 0.2 mg of Robinul with next treatment.  I will examine his medications to see if there is anything we can do to improve the quality of seizure because this 1 was pretty an adequate.  We will go up to 80% of maximum for his neck stimulation follow-up treatment next on Monday.  Lungs:  [x]   Clear to auscultation               []  Other:   Heart:    [x]   Regular rhythm             []  irregular rhythm    [x]   Previous H&P reviewed, patient examined and there are NO CHANGES                 []   Previous H&P reviewed, patient examined and there are changes noted.   Alethia Berthold, MD 7/5/201910:08 AM

## 2017-12-18 NOTE — Anesthesia Post-op Follow-up Note (Signed)
Anesthesia QCDR form completed.        

## 2017-12-18 NOTE — [facility] Group Notes (Signed)
LCSW Group Therapy Note  12/18/2017 1:00 pm  Type of Therapy and Topic:  Group Therapy:  Feelings around Relapse and Recovery  Participation Level:  Active   Description of Group:    Patients in this group will discuss emotions they experience before and after a relapse. They will process how experiencing these feelings, or avoidance of experiencing them, relates to having a relapse. Facilitator will guide patients to explore emotions they have related to recovery. Patients will be encouraged to process which emotions are more powerful. They will be guided to discuss the emotional reaction significant others in their lives may have to their relapse or recovery. Patients will be assisted in exploring ways to respond to the emotions of others without this contributing to a relapse.  Therapeutic Goals: 1. Patient will identify two or more emotions that lead to a relapse for them 2. Patient will identify two emotions that result when they relapse 3. Patient will identify two emotions related to recovery 4. Patient will demonstrate ability to communicate their needs through discussion and/or role plays   Summary of Patient Progress:  Marc Long actively participated in today's group on feelings around relapse.  Marc Long shared that whenever he experiences mania it often results in him relapsing.  Marc Long shared that a risk factor for him relapsing is having negative thoughts about his old lifestyle.  He also discussed his feeling of pride whenever he is able to get through his relapses and go into recovery.   Therapeutic Modalities:   Cognitive Behavioral Therapy Solution-Focused Therapy Assertiveness Training Relapse Prevention Therapy   Devona Konig, LCSW 12/18/2017 1:37 PM

## 2017-12-18 NOTE — Progress Notes (Signed)
Inpatient Diabetes Program Recommendations  AACE/ADA: New Consensus Statement on Inpatient Glycemic Control (2015)  Target Ranges:  Prepandial:   less than 140 mg/dL      Peak postprandial:   less than 180 mg/dL (1-2 hours)      Critically ill patients:  140 - 180 mg/dL   Lab Results  Component Value Date   GLUCAP 97 12/18/2017   HGBA1C 16.7 (H) 12/09/2017    Review of Glycemic ControlResults for YARNELL, ARVIDSON (MRN [id number]) as of 12/18/2017 14:55  Ref. Range 12/17/2017 16:15 12/17/2017 21:02 12/18/2017 06:59 12/18/2017 08:46 12/18/2017 11:48  Glucose-Capillary Latest Ref Range: 70 - 99 mg/dL 91 73 95 88 97    Diabetes history: Type 2 DM Outpatient Diabetes medications:  Novolog 10 units tid with meals, Lantus 25 units daily, Metformin 1000 mg bid Current orders for Inpatient glycemic control:  Novolog sensitive tid with meals, Lantus 25 units daily, Novolog 10 units tid with meals Inpatient Diabetes Program Recommendations:   Please consider reducing Lantus to 22 units daily and reduce Novolog meal coverage to 8 units tid with meals.   Thanks,  Adah Perl, RN, BC-ADM Inpatient Diabetes Coordinator Pager [phone] (8a-5p)

## 2017-12-18 NOTE — Anesthesia Postprocedure Evaluation (Signed)
Anesthesia Post Note  Patient: Marc Long  Procedure(s) Performed: ECT TX  Patient location during evaluation: PACU Anesthesia Type: General Level of consciousness: awake and alert Pain management: pain level controlled Vital Signs Assessment: post-procedure vital signs reviewed and stable Respiratory status: spontaneous breathing and respiratory function stable Cardiovascular status: stable Anesthetic complications: no     Last Vitals:  Vitals:   12/18/17 1022 12/18/17 1032  BP: 115/75 119/80  Pulse: 80 84  Resp: (!) 30 20  Temp: (!) 36.1 C   SpO2: 100% 94%    Last Pain:  Vitals:   12/18/17 1032  TempSrc:   PainSc: 0-No pain                 Marcia Hartwell K

## 2017-12-19 LAB — GLUCOSE, CAPILLARY
GLUCOSE-CAPILLARY: 62 mg/dL — AB (ref 70–99)
GLUCOSE-CAPILLARY: 70 mg/dL (ref 70–99)
Glucose-Capillary: 98 mg/dL (ref 70–99)
Glucose-Capillary: 70 mg/dL (ref 70–99)
Glucose-Capillary: 65 mg/dL — ABNORMAL LOW (ref 70–99)
Glucose-Capillary: 60 mg/dL — ABNORMAL LOW (ref 70–99)

## 2017-12-19 NOTE — Progress Notes (Signed)
  Hypoglycemic Event  CBG: 65  Treatment: 15 GM carbohydrate snack  Symptoms: None  Follow-up CBG: Time: 1632 CBG Result: 62  Follow-up CBG: Time: 1651 CBG Result: 70  Possible Reasons for Event: Unknown  Comments/MD notified:Subedi @1612   Patient was provided with dinner tray and completed 100% of it.   Marc Long

## 2017-12-19 NOTE — Progress Notes (Signed)
[facility] MD Progress Note  12/19/2017 11:32 AM Marc Long  MRN:  [id number] Subjective: Patient seen, pt walking in hallway in front of his room.   Patient had 1st ECT On Fri, next planned on Monday.   Pt reports depression and passive SI.   Pt reports being annoyed by another pt disturbing the unit.   Principal Problem: Severe recurrent major depression without psychotic features ([place]) Diagnosis:   Patient Active Problem List   Diagnosis Date Noted  . Severe recurrent major depression without psychotic features ([place]) [F33.2] 12/16/2017  . MDD (major depressive disorder), recurrent episode, severe ([place]) [F33.2] 12/10/2017  . Obesity (BMI 30-39.9) [E66.9] 12/09/2017  . Depression with suicidal ideation [F32.9, R45.851]   . DKA (diabetic ketoacidoses) ([place]) [E13.10] 12/05/2017  . Hypertriglyceridemia [E78.1] 02/05/2017  . DM (diabetes mellitus), type 2, uncontrolled ([place]) [E11.65] 12/12/2016  . Dehydration [E86.0] 12/12/2016  . Suicidal ideations [R45.851] 12/12/2016  . Bipolar disorder (manic depression) ([place]) [F31.9] 12/12/2016  . Hyperglycemia due to type 2 diabetes mellitus ([place]) [E11.65] 12/12/2016  . AKI (acute kidney injury) ([place]) [N17.9] 12/12/2016  . DKA, type 2, not at goal [facility]) [E11.10] 12/12/2016  . Obesity, Class III, BMI 40-49.9 (morbid obesity) ([place]) [E66.01]   . Type 2 diabetes mellitus without complication, without long-term current use of insulin ([place]) [E11.9] 12/10/2016  . Lithium use [Z79.899] 12/10/2016  . Moderate single current episode of major depressive disorder ([place]) [F32.1] 04/15/2016  . Pain in joint, shoulder region [M25.519] 04/14/2016  . Diverticulosis of large intestine without hemorrhage [K57.30] 11/07/2015  . Calculus of gallbladder without cholecystitis [K80.20] 11/07/2015   Total Time spent with patient: 30 minutes  Past Psychiatric History: Past history of recurrent depression with recent suicidality  Past Medical History:  Past Medical History:   Diagnosis Date  . Depression   . Diabetes mellitus without complication ([place])   . Gallstones   . Obesity, Class III, BMI 40-49.9 (morbid obesity) ([place])    History reviewed. No pertinent surgical history. Family History:  Family History  Problem Relation Age of Onset  . Hypertension Mother   . Depression Mother   . Heart attack Maternal Grandfather   . Lymphoma Maternal Grandmother   . Diabetes Maternal Aunt   . Cancer Maternal Aunt    Family Psychiatric  History: Positive for depression Social History:  Social History   Substance and Sexual Activity  Alcohol Use Yes  . Alcohol/week: 0.0 oz   Comment: once every 2 weeks. Wine coolers     Social History   Substance and Sexual Activity  Drug Use No    Social History   Socioeconomic History  . Marital status: Single    Spouse name: Not on file  . Number of children: 0  . Years of education: Not on file  . Highest education level: Not on file  Occupational History  . Occupation: call center  Social Needs  . Financial resource strain: Not on file  . Food insecurity:    Worry: Not on file    Inability: Not on file  . Transportation needs:    Medical: Not on file    Non-medical: Not on file  Tobacco Use  . Smoking status: Never Smoker  . Smokeless tobacco: Never Used  Substance and Sexual Activity  . Alcohol use: Yes    Alcohol/week: 0.0 oz    Comment: once every 2 weeks. Wine coolers  . Drug use: No  . Sexual activity: Not Currently    Birth control/protection: None  Lifestyle  . Physical activity:    Days per week: Not on file    Minutes per session: Not on file  . Stress: Not on file  Relationships  . Social connections:    Talks on phone: Not on file    Gets together: Not on file    Attends religious service: Not on file    Active member of club or organization: Not on file    Attends meetings of clubs or organizations: Not on file    Relationship status: Not on file  Other Topics Concern  . Not  on file  Social History Narrative  . Not on file   Additional Social History:                         Sleep: Fair  Appetite:  Fair  Current Medications: Current Facility-Administered Medications  Medication Dose Route Frequency Provider Last Rate Last Dose  . acetaminophen (TYLENOL) tablet 650 mg  650 mg Oral Q6H PRN Clapacs, Madie Reno, MD   650 mg at 12/18/17 1347  . alum & mag hydroxide-simeth (MAALOX/MYLANTA) 200-200-20 MG/5ML suspension 30 mL  30 mL Oral Q4H PRN Clapacs, Nolon T, MD      . busPIRone (BUSPAR) tablet 10 mg  10 mg Oral TID Clapacs, Madie Reno, MD   10 mg at 12/19/17 1130  . feeding supplement (GLUCERNA SHAKE) (GLUCERNA SHAKE) liquid 237 mL  237 mL Oral TID BM Clapacs, Sabastion T, MD   237 mL at 12/19/17 1000  . FLUoxetine (PROZAC) capsule 40 mg  40 mg Oral Daily Clapacs, Isa T, MD   40 mg at 12/19/17 5366  . hydrocortisone cream 1 %   Topical BID PRN Clapacs, Chuck T, MD      . hydrOXYzine (ATARAX/VISTARIL) tablet 50 mg  50 mg Oral TID PRN Clapacs, Madie Reno, MD      . ibuprofen (ADVIL,MOTRIN) tablet 600 mg  600 mg Oral Q6H PRN Clapacs, Madie Reno, MD   600 mg at 12/19/17 0640  . insulin aspart (novoLOG) injection 0-9 Units  0-9 Units Subcutaneous TID WC Clapacs, Shepard T, MD      . insulin aspart (novoLOG) injection 10 Units  10 Units Subcutaneous TID WC Clapacs, Madie Reno, MD   10 Units at 12/19/17 1130  . insulin glargine (LANTUS) injection 25 Units  25 Units Subcutaneous Daily Clapacs, Madie Reno, MD   25 Units at 12/19/17 [phone]  . magnesium hydroxide (MILK OF MAGNESIA) suspension 30 mL  30 mL Oral Daily PRN Clapacs, Thatcher T, MD      . metFORMIN (GLUCOPHAGE) tablet 1,000 mg  1,000 mg Oral BID WC Clapacs, Madie Reno, MD   1,000 mg at 12/19/17 0828  . midazolam (VERSED) injection 2 mg  2 mg Intravenous Once Clapacs, Harlan T, MD      . multivitamin with minerals tablet 1 tablet  1 tablet Oral Daily Clapacs, Madie Reno, MD   1 tablet at 12/19/17 [phone]  . ondansetron (ZOFRAN) injection 4 mg  4 mg  Intravenous Once PRN Gunnar Fusi, MD      . pantoprazole (PROTONIX) EC tablet 40 mg  40 mg Oral Daily Clapacs, Madie Reno, MD   40 mg at 12/19/17 5956  . polyethylene glycol (MIRALAX / GLYCOLAX) packet 17 g  17 g Oral Daily Clapacs, Madie Reno, MD   17 g at 12/19/17 3875  . QUEtiapine (SEROQUEL) tablet 50 mg  50 mg Oral QHS Clapacs, Bayler  T, MD   50 mg at 12/18/17 2058  . traZODone (DESYREL) tablet 100 mg  100 mg Oral QHS PRN Clapacs, Madie Reno, MD   100 mg at 12/18/17 2058  . traZODone (DESYREL) tablet 50 mg  50 mg Oral QHS Clapacs, Madie Reno, MD   50 mg at 12/18/17 2057    Lab Results:  Results for orders placed or performed during the hospital encounter of 12/16/17 (from the past 48 hour(s))  Glucose, capillary     Status: Abnormal   Collection Time: 12/17/17 12:14 PM  Result Value Ref Range   Glucose-Capillary 110 (H) 70 - 99 mg/dL  Glucose, capillary     Status: None   Collection Time: 12/17/17  4:15 PM  Result Value Ref Range   Glucose-Capillary 91 70 - 99 mg/dL  Glucose, capillary     Status: None   Collection Time: 12/17/17  9:02 PM  Result Value Ref Range   Glucose-Capillary 73 70 - 99 mg/dL  Glucose, capillary     Status: None   Collection Time: 12/18/17  6:59 AM  Result Value Ref Range   Glucose-Capillary 95 70 - 99 mg/dL   Comment 1 Notify RN   Glucose, capillary     Status: None   Collection Time: 12/18/17  8:46 AM  Result Value Ref Range   Glucose-Capillary 88 70 - 99 mg/dL  Glucose, capillary     Status: None   Collection Time: 12/18/17 11:48 AM  Result Value Ref Range   Glucose-Capillary 97 70 - 99 mg/dL  Glucose, capillary     Status: Abnormal   Collection Time: 12/18/17  4:30 PM  Result Value Ref Range   Glucose-Capillary 69 (L) 70 - 99 mg/dL  Glucose, capillary     Status: Abnormal   Collection Time: 12/18/17  8:56 PM  Result Value Ref Range   Glucose-Capillary 130 (H) 70 - 99 mg/dL  Glucose, capillary     Status: None   Collection Time: 12/19/17  6:43 AM   Result Value Ref Range   Glucose-Capillary 98 70 - 99 mg/dL  Glucose, capillary     Status: None   Collection Time: 12/19/17 11:28 AM  Result Value Ref Range   Glucose-Capillary 70 70 - 99 mg/dL   Comment 1 Notify RN     Blood Alcohol level:  Lab Results  Component Value Date   ETH <10 81/85/6314    Metabolic Disorder Labs: Lab Results  Component Value Date   HGBA1C 16.7 (H) 12/09/2017   MPG 432.59 12/09/2017   No results found for: PROLACTIN Lab Results  Component Value Date   CHOL 142 03/05/2017   TRIG 148 03/05/2017   HDL 27 (L) 03/05/2017   CHOLHDL 5.3 (H) 03/05/2017   LDLCALC 85 03/05/2017   [place] Comment 12/10/2016    Physical Findings: AIMS:  , ,  ,  ,    CIWA:    COWS:     Musculoskeletal: Strength & Muscle Tone: within normal limits Gait & Station: normal Patient leans: N/A  Psychiatric Specialty Exam: Physical Exam  Nursing note and vitals reviewed. Constitutional: He appears well-developed and well-nourished.  Respiratory: No respiratory distress.  Neurological: He is alert.  Psychiatric: Judgment normal. His affect is blunt. His speech is delayed. He is slowed. Cognition and memory are impaired. He expresses suicidal ideation. He expresses no suicidal plans.    Review of Systems  Constitutional: Negative.   HENT: Negative.   Eyes: Negative.   Respiratory: Negative.  Cardiovascular: Negative.   Gastrointestinal: Negative.   Musculoskeletal: Negative.   Skin: Negative.   Neurological: Positive for headaches.  Psychiatric/Behavioral: Positive for depression and suicidal ideas. Negative for hallucinations, memory loss and substance abuse. The patient is nervous/anxious. The patient does not have insomnia.     Blood pressure 113/77, pulse 94, temperature 97.8 F (36.6 C), temperature source Oral, resp. rate 18, height 5\' 11"  (1.803 m), weight 124.3 kg (274 lb), SpO2 97 %.Body mass index is 38.22 kg/m.  General Appearance: Disheveled, age  appropriate  Eye Contact:  Good  Speech:  Slow  Volume:  Decreased  Mood:  Depressed  Affect:  Congruent, depressed  Thought Process:  Goal Directed  Orientation:  Full (Time, Place, and Person)  Thought Content:  Logical  Suicidal Thoughts:  Yes.  without intent/plan  Homicidal Thoughts:  No  Memory:  Immediate;   Fair Recent;   Fair Remote;   Fair  Judgement:  Fair  Insight:  Fair  Psychomotor Activity:  Decreased  Concentration:  Concentration: Fair  Recall:  [employer] of Knowledge:  Fair  Language:  Fair  Akathisia:  No  Handed:  Right  AIMS (if indicated):     Assets:  Desire for Improvement  ADL's:  Intact  Cognition:  Impaired,  Mild  Sleep:  Number of Hours: 7.5     Treatment Plan Summary: Pt depressed and voicing passive SI. Cont prozac and seroquel, Buspar. Cont ECT, next on Monday  Lenward Chancellor, MD 12/19/2017, 11:32 AMPatient ID: Marc Long, male   DOB: 1975/09/16, 42 y.o.   MRN: [id number]

## 2017-12-19 NOTE — Plan of Care (Addendum)
Patient found in day room upon my arrival. Patient is visible and social throughout the evening. Reports continued improvement in mood. Affect is animated. Patient is polite and pleasant. Denies SI/HI/AVH. Complains of HA 4/10, given Motrin with positive results. Given PRN dose of Trazodone with positive results. Reports eating and voiding adequately. CBG 130. No scheduled coverage @ HS. Compliant with HS medications and staff direction. Attends group. Q 15 minute checks maintained. Will continue to monitor throughout the shift.  Patient slept 7.5 hours. No apparent distress. Complains of generalized muscle pain, given Motrin. CBG 98. No coverage necessary. Given 10 units Novolog and Lantus 25 units scheduled. Will endorse care to oncoming shift.  Problem: Education: Goal: Knowledge of [place] [facility] Education information/materials will improve Outcome: Progressing Goal: Emotional status will improve Outcome: Progressing Goal: Mental status will improve Outcome: Progressing Goal: Verbalization of understanding the information provided will improve Outcome: Progressing   Problem: Education: Goal: Knowledge of [place] [facility] Education information/materials will improve Outcome: Progressing Goal: Emotional status will improve Outcome: Progressing Goal: Mental status will improve Outcome: Progressing Goal: Verbalization of understanding the information provided will improve Outcome: Progressing   Problem: Activity: Goal: Interest or engagement in activities will improve Outcome: Progressing Goal: Sleeping patterns will improve Outcome: Progressing   Problem: Coping: Goal: Ability to verbalize frustrations and anger appropriately will improve Outcome: Progressing Goal: Ability to demonstrate self-control will improve Outcome: Progressing   Problem: Self-Concept: Goal: Level of anxiety will decrease Outcome: Progressing

## 2017-12-19 NOTE — [facility] Group Notes (Signed)
LCSW Group Therapy Note  12/19/2017 1:15pm  Type of Therapy and Topic:  Group Therapy:  Feelings around Relapse and Recovery  Participation Level:  Active   Description of Group:    Patients in this group will discuss emotions they experience before and after a relapse. They will process how experiencing these feelings, or avoidance of experiencing them, relates to having a relapse. Facilitator will guide patients to explore emotions they have related to recovery. Patients will be encouraged to process which emotions are more powerful. They will be guided to discuss the emotional reaction significant others in their lives may have to their relapse or recovery. Patients will be assisted in exploring ways to respond to the emotions of others without this contributing to a relapse.  Therapeutic Goals: 1. Patient will identify two or more emotions that lead to a relapse for them 2. Patient will identify two emotions that result when they relapse 3. Patient will identify two emotions related to recovery 4. Patient will demonstrate ability to communicate their needs through discussion and/or role plays   Summary of Patient Progress: Pt able to meet therapeutic goals listed above. Pt shares his hopes of what life will be like when he is in recovery.  He shares he will feel more in control and able to trust and like himself more.    Therapeutic Modalities:   Cognitive Behavioral Therapy Solution-Focused Therapy Assertiveness Training Relapse Prevention Therapy   August Saucer, LCSW 12/19/2017 1:53 PM

## 2017-12-19 NOTE — [facility] Group Notes (Signed)
[place] Group Notes:  (Nursing/MHT/Case Management/Adjunct)  Date:  12/19/2017  Time:  10:11 PM  Type of Therapy:  Group Therapy  Participation Level:  Active  Participation Quality:  Appropriate  Affect:  Appropriate  Cognitive:  Appropriate  Insight:  Appropriate  Engagement in Group:  Engaged  Modes of Intervention:  Discussion  Summary of Progress/Problems: MHT reviewed rules and expectations of the unit. MHT informed patients of visitation and phone hours. MHT informed patients of routine checks throughout the night and reminded to cover up. MHT engaged patients with an introduction game. Game was to make 3 statements, 2 being true, one being false and the group deciding which was false. MHT had patients tell goal for the day and if they were able to accomplish it. MHT reviewed what was discussed in groups on this day, recovery and if they learned anything useful to do when released. MHT discussed the thought, feeling, behavioral model. MHT informed patients not to dwell on the negative things in life, but to focus on what was needed to make the change. MHT encouraged patients not to dwell on things they had no control over. MHT encouraged patients to make changes by doing things differently. MHT encouraged patients to make changes that would promote positive changes in their lives. Marc Long participated in the introduction activity. Marc Long asked about how to change his way of thinking. Discussed with Marc Long figuring out what he wanted to accomplish, then working on his thinking to accomplish goal. Marc Long was receptive and stated he was learning things to help him upon his discharge.  [name] 12/19/2017, 10:11 PM

## 2017-12-19 NOTE — Plan of Care (Signed)
Data: Patient is appropriate and cooperative to assessment. Patient endorses passive SI and denies HI/AVH. Patient has completed daily self inventory worksheet. Patient reports depressed mood, and has an appropriate affect. Patient has no complaints and a pain rating of 0/10. Patient reports good sleep quality, appetite is good. Patient rates depression "6/10" , feelings of hopelessness "6/10" and anxiety "3/10" Patient has no stated goal for today.  Action:  Q x 15 minute observation checks were completed for safety. Patient was provided with education on medications. Patient was offered support and encouragement. Patient was given scheduled medications. Patient  was encourage to attend groups, participate in unit activities and continue with plan of care.     Response: Patient is medication compliant. Patient has no complaints at this time. Patient is receptive to treatment and safety maintained on unit.    Problem: Education: Goal: Knowledge of [place] [facility] Education information/materials will improve Outcome: Progressing Goal: Mental status will improve Outcome: Progressing   Problem: Activity: Goal: Interest or engagement in activities will improve Outcome: Progressing   Problem: Safety: Goal: Periods of time without injury will increase Outcome: Progressing   Problem: Education: Goal: Verbalization of understanding the information provided will improve Outcome: Not Progressing

## 2017-12-20 LAB — GLUCOSE, CAPILLARY
GLUCOSE-CAPILLARY: 91 mg/dL (ref 70–99)
GLUCOSE-CAPILLARY: 76 mg/dL (ref 70–99)
GLUCOSE-CAPILLARY: 107 mg/dL — AB (ref 70–99)
Glucose-Capillary: 160 mg/dL — ABNORMAL HIGH (ref 70–99)
Glucose-Capillary: 102 mg/dL — ABNORMAL HIGH (ref 70–99)
Glucose-Capillary: 55 mg/dL — ABNORMAL LOW (ref 70–99)
Glucose-Capillary: 52 mg/dL — ABNORMAL LOW (ref 70–99)

## 2017-12-20 MED ORDER — IBUPROFEN 800 MG PO TABS
800.0000 mg | ORAL_TABLET | Freq: Four times a day (QID) | ORAL | Status: DC | PRN
  Administered 2017-12-20 – 2017-12-24 (×2): 800 mg via ORAL
  Filled 2017-12-20: qty 1
  Filled 2017-12-20: qty 4
  Filled 2017-12-20: qty 1
  Filled 2017-12-20 (×3): qty 4
  Filled 2017-12-20: qty 1

## 2017-12-20 NOTE — Progress Notes (Signed)
[facility] MD Progress Note  12/20/2017 2:48 PM Marc Long  MRN:  [id number] Subjective:   "My body hurts, muscle sore" Patient seen, pt in his room,    Patient had 1st ECT On Fri, next planned on Monday.   Pt generalized body ache, "muscle sore", had ibuprofen 600 mg with partial relief, will increase it to 800 mg ,  reports depression and passive SI, no plan.    Principal Problem: Severe recurrent major depression without psychotic features ([place]) Diagnosis:   Patient Active Problem List   Diagnosis Date Noted  . Severe recurrent major depression without psychotic features ([place]) [F33.2] 12/16/2017  . MDD (major depressive disorder), recurrent episode, severe ([place]) [F33.2] 12/10/2017  . Obesity (BMI 30-39.9) [E66.9] 12/09/2017  . Depression with suicidal ideation [F32.9, R45.851]   . DKA (diabetic ketoacidoses) ([place]) [E13.10] 12/05/2017  . Hypertriglyceridemia [E78.1] 02/05/2017  . DM (diabetes mellitus), type 2, uncontrolled ([place]) [E11.65] 12/12/2016  . Dehydration [E86.0] 12/12/2016  . Suicidal ideations [R45.851] 12/12/2016  . Bipolar disorder (manic depression) ([place]) [F31.9] 12/12/2016  . Hyperglycemia due to type 2 diabetes mellitus ([place]) [E11.65] 12/12/2016  . AKI (acute kidney injury) ([place]) [N17.9] 12/12/2016  . DKA, type 2, not at goal [facility]) [E11.10] 12/12/2016  . Obesity, Class III, BMI 40-49.9 (morbid obesity) ([place]) [E66.01]   . Type 2 diabetes mellitus without complication, without long-term current use of insulin ([place]) [E11.9] 12/10/2016  . Lithium use [Z79.899] 12/10/2016  . Moderate single current episode of major depressive disorder ([place]) [F32.1] 04/15/2016  . Pain in joint, shoulder region [M25.519] 04/14/2016  . Diverticulosis of large intestine without hemorrhage [K57.30] 11/07/2015  . Calculus of gallbladder without cholecystitis [K80.20] 11/07/2015   Total Time spent with patient: 25 min  Past Psychiatric History: Past history of recurrent depression with  recent suicidality  Past Medical History:  Past Medical History:  Diagnosis Date  . Depression   . Diabetes mellitus without complication ([place])   . Gallstones   . Obesity, Class III, BMI 40-49.9 (morbid obesity) ([place])    History reviewed. No pertinent surgical history. Family History:  Family History  Problem Relation Age of Onset  . Hypertension Mother   . Depression Mother   . Heart attack Maternal Grandfather   . Lymphoma Maternal Grandmother   . Diabetes Maternal Aunt   . Cancer Maternal Aunt    Family Psychiatric  History: Positive for depression Social History:  Social History   Substance and Sexual Activity  Alcohol Use Yes  . Alcohol/week: 0.0 oz   Comment: once every 2 weeks. Wine coolers     Social History   Substance and Sexual Activity  Drug Use No    Social History   Socioeconomic History  . Marital status: Single    Spouse name: Not on file  . Number of children: 0  . Years of education: Not on file  . Highest education level: Not on file  Occupational History  . Occupation: call center  Social Needs  . Financial resource strain: Not on file  . Food insecurity:    Worry: Not on file    Inability: Not on file  . Transportation needs:    Medical: Not on file    Non-medical: Not on file  Tobacco Use  . Smoking status: Never Smoker  . Smokeless tobacco: Never Used  Substance and Sexual Activity  . Alcohol use: Yes    Alcohol/week: 0.0 oz    Comment: once every 2 weeks. Wine coolers  . Drug  use: No  . Sexual activity: Not Currently    Birth control/protection: None  Lifestyle  . Physical activity:    Days per week: Not on file    Minutes per session: Not on file  . Stress: Not on file  Relationships  . Social connections:    Talks on phone: Not on file    Gets together: Not on file    Attends religious service: Not on file    Active member of club or organization: Not on file    Attends meetings of clubs or organizations: Not on file     Relationship status: Not on file  Other Topics Concern  . Not on file  Social History Narrative  . Not on file   Additional Social History:                         Sleep: Fair  Appetite:  Fair  Current Medications: Current Facility-Administered Medications  Medication Dose Route Frequency Provider Last Rate Last Dose  . acetaminophen (TYLENOL) tablet 650 mg  650 mg Oral Q6H PRN Clapacs, Madie Reno, MD   650 mg at 12/18/17 1347  . alum & mag hydroxide-simeth (MAALOX/MYLANTA) 200-200-20 MG/5ML suspension 30 mL  30 mL Oral Q4H PRN Clapacs, Jorey T, MD      . busPIRone (BUSPAR) tablet 10 mg  10 mg Oral TID Clapacs, Madie Reno, MD   10 mg at 12/20/17 1127  . feeding supplement (GLUCERNA SHAKE) (GLUCERNA SHAKE) liquid 237 mL  237 mL Oral TID BM Clapacs, Roni T, MD   237 mL at 12/20/17 1100  . FLUoxetine (PROZAC) capsule 40 mg  40 mg Oral Daily Clapacs, Madie Reno, MD   40 mg at 12/20/17 4259  . hydrocortisone cream 1 %   Topical BID PRN Clapacs, Madie Reno, MD      . hydrOXYzine (ATARAX/VISTARIL) tablet 50 mg  50 mg Oral TID PRN Clapacs, Madie Reno, MD      . ibuprofen (ADVIL,MOTRIN) tablet 800 mg  800 mg Oral Q6H PRN Lenward Chancellor, MD      . insulin aspart (novoLOG) injection 0-9 Units  0-9 Units Subcutaneous TID WC Clapacs, Madie Reno, MD   2 Units at 12/20/17 1003  . insulin aspart (novoLOG) injection 10 Units  10 Units Subcutaneous TID WC Clapacs, Madie Reno, MD   10 Units at 12/20/17 1227  . insulin glargine (LANTUS) injection 25 Units  25 Units Subcutaneous Daily Clapacs, Madie Reno, MD   25 Units at 12/20/17 0815  . magnesium hydroxide (MILK OF MAGNESIA) suspension 30 mL  30 mL Oral Daily PRN Clapacs, Ivon T, MD      . metFORMIN (GLUCOPHAGE) tablet 1,000 mg  1,000 mg Oral BID WC Clapacs, Madie Reno, MD   1,000 mg at 12/20/17 5638  . midazolam (VERSED) injection 2 mg  2 mg Intravenous Once Clapacs, Dequavious T, MD      . multivitamin with minerals tablet 1 tablet  1 tablet Oral Daily Clapacs, Madie Reno, MD   1  tablet at 12/20/17 [phone]  . ondansetron (ZOFRAN) injection 4 mg  4 mg Intravenous Once PRN Gunnar Fusi, MD      . pantoprazole (PROTONIX) EC tablet 40 mg  40 mg Oral Daily Clapacs, Madie Reno, MD   40 mg at 12/20/17 3329  . polyethylene glycol (MIRALAX / GLYCOLAX) packet 17 g  17 g Oral Daily Clapacs, Madie Reno, MD   17 g at 12/19/17 5188  .  QUEtiapine (SEROQUEL) tablet 50 mg  50 mg Oral QHS Clapacs, Madie Reno, MD   50 mg at 12/19/17 2129  . traZODone (DESYREL) tablet 100 mg  100 mg Oral QHS PRN Clapacs, Madie Reno, MD   100 mg at 12/18/17 2058  . traZODone (DESYREL) tablet 50 mg  50 mg Oral QHS Clapacs, Madie Reno, MD   50 mg at 12/19/17 2129    Lab Results:  Results for orders placed or performed during the hospital encounter of 12/16/17 (from the past 48 hour(s))  Glucose, capillary     Status: Abnormal   Collection Time: 12/18/17  4:30 PM  Result Value Ref Range   Glucose-Capillary 69 (L) 70 - 99 mg/dL  Glucose, capillary     Status: Abnormal   Collection Time: 12/18/17  8:56 PM  Result Value Ref Range   Glucose-Capillary 130 (H) 70 - 99 mg/dL  Glucose, capillary     Status: None   Collection Time: 12/19/17  6:43 AM  Result Value Ref Range   Glucose-Capillary 98 70 - 99 mg/dL  Glucose, capillary     Status: None   Collection Time: 12/19/17 11:28 AM  Result Value Ref Range   Glucose-Capillary 70 70 - 99 mg/dL   Comment 1 Notify RN   Glucose, capillary     Status: Abnormal   Collection Time: 12/19/17  4:12 PM  Result Value Ref Range   Glucose-Capillary 65 (L) 70 - 99 mg/dL  Glucose, capillary     Status: Abnormal   Collection Time: 12/19/17  4:33 PM  Result Value Ref Range   Glucose-Capillary 62 (L) 70 - 99 mg/dL  Glucose, capillary     Status: None   Collection Time: 12/19/17  4:51 PM  Result Value Ref Range   Glucose-Capillary 70 70 - 99 mg/dL  Glucose, capillary     Status: Abnormal   Collection Time: 12/19/17  8:32 PM  Result Value Ref Range   Glucose-Capillary 60 (L) 70 - 99  mg/dL   Comment 1 Notify RN   Glucose, capillary     Status: None   Collection Time: 12/20/17  7:05 AM  Result Value Ref Range   Glucose-Capillary 91 70 - 99 mg/dL   Comment 1 Notify RN   Glucose, capillary     Status: Abnormal   Collection Time: 12/20/17  9:47 AM  Result Value Ref Range   Glucose-Capillary 160 (H) 70 - 99 mg/dL  Glucose, capillary     Status: Abnormal   Collection Time: 12/20/17 11:27 AM  Result Value Ref Range   Glucose-Capillary 102 (H) 70 - 99 mg/dL   Comment 1 Notify RN     Blood Alcohol level:  Lab Results  Component Value Date   ETH <10 37/90/2409    Metabolic Disorder Labs: Lab Results  Component Value Date   HGBA1C 16.7 (H) 12/09/2017   MPG 432.59 12/09/2017   No results found for: PROLACTIN Lab Results  Component Value Date   CHOL 142 03/05/2017   TRIG 148 03/05/2017   HDL 27 (L) 03/05/2017   CHOLHDL 5.3 (H) 03/05/2017   LDLCALC 85 03/05/2017   [place] Comment 12/10/2016    Physical Findings: AIMS:  , ,  ,  ,    CIWA:    COWS:     Musculoskeletal: Strength & Muscle Tone: within normal limits Gait & Station: normal Patient leans: N/A  Psychiatric Specialty Exam: Physical Exam  Nursing note and vitals reviewed. Constitutional: He is oriented to person, place,  and time. He appears well-developed and well-nourished.  Respiratory: No respiratory distress.  Neurological: He is alert and oriented to person, place, and time.  Psychiatric: Judgment normal. His affect is blunt. His speech is delayed. He is slowed. Cognition and memory are impaired. He expresses suicidal ideation. He expresses no suicidal plans.    Review of Systems  Constitutional: Negative.   HENT: Negative.   Eyes: Negative.   Respiratory: Negative.   Cardiovascular: Negative.   Gastrointestinal: Negative.   Musculoskeletal: Negative.   Skin: Negative.   Neurological: Positive for headaches.  Psychiatric/Behavioral: Positive for depression and suicidal ideas.  Negative for hallucinations, memory loss and substance abuse. The patient is nervous/anxious. The patient does not have insomnia.     Blood pressure 107/79, pulse 81, temperature 98.6 F (37 C), temperature source Oral, resp. rate 18, height 5\' 11"  (1.803 m), weight 124.3 kg (274 lb), SpO2 100 %.Body mass index is 38.22 kg/m.  General Appearance: Disheveled, age appropriate  Eye Contact:  Good  Speech:  Slow  Volume:  Decreased  Mood:  Depressed  Affect:  Congruent, depressed  Thought Process:  Goal Directed  Orientation:  Full (Time, Place, and Person)  Thought Content:  Logical  Suicidal Thoughts:  Yes.  without intent/plan, bodycahe  Homicidal Thoughts:  No  Memory:  Immediate;   Fair Recent;   Fair Remote;   Fair  Judgement:  Fair  Insight:  Fair  Psychomotor Activity:  Decreased  Concentration:  Concentration: Fair  Recall:  [employer] of Knowledge:  Fair  Language:  Fair  Akathisia:  No  Handed:  Right  AIMS (if indicated):     Assets:  Desire for Improvement  ADL's:  Intact  Cognition:  Impaired,  Mild  Sleep:  Number of Hours: 6     Treatment Plan Summary: Pt depressed and voicing passive SI. Increase ibuprofen to 800mg  qid/prn for body ache..  Cont prozac and seroquel, Buspar. Cont ECT, next on Monday.   Lenward Chancellor, MD 12/20/2017, 2:48 PMPatient ID: Marc Long, male   DOB: 06/30/1975, 42 y.o.   MRN: [id number] Patient ID: Marc Long, male   DOB: 1976-01-12, 42 y.o.   MRN: [id number]

## 2017-12-20 NOTE — Progress Notes (Signed)
Hypoglycemic Event  CBG: earlier @2030  was 52 and 55  Treatment: 15 GM carbohydrate snack  Symptoms: None  Follow-up CBG: Time:2100 CBG Result:107  Possible Reasons for Event: Unknown   Comments/MD notified:no new orders    Clemens Catholic

## 2017-12-20 NOTE — Progress Notes (Signed)
Patient states to this writer that he thinks his left ankle is swollen. This writer accessed patient's legs bilaterally and there is no evidence of any pitting edema. Patient would like MD to evaluate him.

## 2017-12-20 NOTE — Plan of Care (Signed)
Patient verbalizes understanding of the general information that's been provided to him and all questions/concerns have been addressed and answered. Patient endorses passive Si, however, he does contract for safety with this Probation officer. Patient rates his depression a "5/10" stating it's from "general depression". Patient denies HI/AVH as well as anxiety. Patient has attended unit groups and has been observed outside with staff and interacting with other members on the unit without any issues. Patient stated that he slept "well" last night and has been present in the milieu throughout the day. Patient has the ability to verbalize his anger and frustrations appropriately and has demonstrated self-control on the unit. Patient verbalizes understanding of and has been in compliance with his prescribed therapeutic regimen and has worked to maintain his clinical measurements within normal limits. Patient has the ability to identify the available resources that can assist him in meeting his health-care needs. Patient has been free from injury thus far on the unit and remains safe on the unit at this time.   Problem: Education: Goal: Knowledge of [place] [facility] Education information/materials will improve Outcome: Progressing Goal: Emotional status will improve Outcome: Progressing Goal: Mental status will improve Outcome: Progressing Goal: Verbalization of understanding the information provided will improve Outcome: Progressing   Problem: Coping: Goal: Coping ability will improve Outcome: Progressing   Problem: Self-Concept: Goal: Ability to disclose and discuss suicidal ideas will improve Outcome: Progressing   Problem: Activity: Goal: Interest or engagement in leisure activities will improve Outcome: Progressing Goal: Imbalance in normal sleep/wake cycle will improve Outcome: Progressing   Problem: Self-Concept: Goal: Will verbalize positive feelings about self Outcome: Progressing Goal:  Level of anxiety will decrease Outcome: Progressing   Problem: Education: Goal: Knowledge of [place] [facility] Education information/materials will improve Outcome: Progressing Goal: Emotional status will improve Outcome: Progressing Goal: Mental status will improve Outcome: Progressing Goal: Verbalization of understanding the information provided will improve Outcome: Progressing   Problem: Activity: Goal: Interest or engagement in activities will improve Outcome: Progressing Goal: Sleeping patterns will improve Outcome: Progressing   Problem: Coping: Goal: Ability to verbalize frustrations and anger appropriately will improve Outcome: Progressing Goal: Ability to demonstrate self-control will improve Outcome: Progressing   Problem: Health Behavior/Discharge Planning: Goal: Identification of resources available to assist in meeting health care needs will improve Outcome: Progressing Goal: Compliance with treatment plan for underlying cause of condition will improve Outcome: Progressing   Problem: Physical Regulation: Goal: Ability to maintain clinical measurements within normal limits will improve Outcome: Progressing   Problem: Safety: Goal: Periods of time without injury will increase Outcome: Progressing   Problem: Education: Goal: Utilization of techniques to improve thought processes will improve Outcome: Progressing Goal: Knowledge of the prescribed therapeutic regimen will improve Outcome: Progressing   Problem: Activity: Goal: Interest or engagement in leisure activities will improve Outcome: Progressing Goal: Imbalance in normal sleep/wake cycle will improve Outcome: Progressing   Problem: Coping: Goal: Coping ability will improve Outcome: Progressing Goal: Will verbalize feelings Outcome: Progressing   Problem: Health Behavior/Discharge Planning: Goal: Ability to make decisions will improve Outcome: Progressing Goal: Compliance with therapeutic  regimen will improve Outcome: Progressing   Problem: Role Relationship: Goal: Will demonstrate positive changes in social behaviors and relationships Outcome: Progressing   Problem: Safety: Goal: Ability to disclose and discuss suicidal ideas will improve Outcome: Progressing Goal: Ability to identify and utilize support systems that promote safety will improve Outcome: Progressing   Problem: Self-Concept: Goal: Will verbalize positive feelings about self Outcome: Progressing Goal: Level  of anxiety will decrease Outcome: Progressing   Problem: Education: Goal: Knowledge of General Education information will improve Outcome: Progressing   Problem: Clinical Measurements: Goal: Ability to maintain clinical measurements within normal limits will improve Outcome: Progressing

## 2017-12-20 NOTE — Progress Notes (Signed)
This Probation officer didn't give patient his 10 units with meals because his CBG was 4, MD was notified and instructed this writer to take patient's CBG again and give sliding scale.

## 2017-12-20 NOTE — Progress Notes (Signed)
D- Patient alert and oriented. Patient presents in a sad/depressed mood on assessment stating that he slept "well" last night and the only complaint he had was that he has some muscle soreness, which he believes is a side effect of his ECT treatment, so patient did request some pain medication from this Probation officer.  Patient rates his depression a "5/10" stating that it's just "general depression". Patient denies SI, HI, AVH, at this time. Patient also denies any anxiety. Patient's goal for today is "learning about something that will help me forgive myself", in which he will "pay attention in group" in order to do this.  A- Scheduled medications administered to patient, per MD orders. Support and encouragement provided.  Routine safety checks conducted every 15 minutes.  Patient informed to notify staff with problems or concerns.  R- No adverse drug reactions noted. Patient contracts for safety at this time. Patient compliant with medications and treatment plan. Patient receptive, calm, and cooperative. Patient interacts well with others on the unit.  Patient remains safe at this time.

## 2017-12-20 NOTE — Progress Notes (Signed)
Patient was not given his sliding scale meal coverage because his CBG was 76.

## 2017-12-20 NOTE — Plan of Care (Signed)
Patient is responding well to treatment and states that his medicines are working fine and expresses no concerns, contract verbal safety of self and others, attend groups and participate In peer  activities, patient is given  moral support with encouragement, patient is cooperating with medication regimen, sleep is continuous without any interruptions no distress, denies any SI/HI no signs of AVH.at this time. Problem: Education: Goal: Knowledge of [place] [facility] Education information/materials will improve 12/20/2017 0048 by Clemens Catholic, RN Outcome: Progressing 12/20/2017 0046 by Clemens Catholic, RN Outcome: Progressing Goal: Emotional status will improve 12/20/2017 0048 by Clemens Catholic, RN Outcome: Progressing 12/20/2017 0046 by Clemens Catholic, RN Outcome: Progressing Goal: Mental status will improve 12/20/2017 0048 by Clemens Catholic, RN Outcome: Progressing 12/20/2017 0046 by Clemens Catholic, RN Outcome: Progressing Goal: Verbalization of understanding the information provided will improve 12/20/2017 0048 by Clemens Catholic, RN Outcome: Progressing 12/20/2017 0046 by Clemens Catholic, RN Outcome: Progressing   Problem: Coping: Goal: Coping ability will improve 12/20/2017 0048 by Clemens Catholic, RN Outcome: Progressing 12/20/2017 0046 by Clemens Catholic, RN Outcome: Progressing   Problem: Self-Concept: Goal: Ability to disclose and discuss suicidal ideas will improve 12/20/2017 0048 by Clemens Catholic, RN Outcome: Progressing 12/20/2017 0046 by Clemens Catholic, RN Outcome: Progressing   Problem: Activity: Goal: Interest or engagement in leisure activities will improve 12/20/2017 0048 by Clemens Catholic, RN Outcome: Progressing 12/20/2017 0046 by Clemens Catholic, RN Outcome: Progressing Goal: Imbalance in normal sleep/wake cycle will improve 12/20/2017 0048 by Clemens Catholic, RN Outcome: Progressing 12/20/2017 0046 by Clemens Catholic,  RN Outcome: Progressing   Problem: Self-Concept: Goal: Will verbalize positive feelings about self 12/20/2017 0048 by Clemens Catholic, RN Outcome: Progressing 12/20/2017 0046 by Clemens Catholic, RN Outcome: Progressing Goal: Level of anxiety will decrease 12/20/2017 0048 by Clemens Catholic, RN Outcome: Progressing 12/20/2017 0046 by Clemens Catholic, RN Outcome: Progressing   Problem: Education: Goal: Knowledge of [place] Education information/materials will improve 12/20/2017 0048 by Clemens Catholic, RN Outcome: Progressing 12/20/2017 0046 by Clemens Catholic, RN Outcome: Progressing Goal: Emotional status will improve 12/20/2017 0048 by Clemens Catholic, RN Outcome: Progressing 12/20/2017 0046 by Clemens Catholic, RN Outcome: Progressing Goal: Mental status will improve 12/20/2017 0048 by Clemens Catholic, RN Outcome: Progressing 12/20/2017 0046 by Clemens Catholic, RN Outcome: Progressing Goal: Verbalization of understanding the information provided will improve 12/20/2017 0048 by Clemens Catholic, RN Outcome: Progressing 12/20/2017 0046 by Clemens Catholic, RN Outcome: Progressing   Problem: Activity: Goal: Interest or engagement in activities will improve 12/20/2017 0048 by Clemens Catholic, RN Outcome: Progressing 12/20/2017 0046 by Clemens Catholic, RN Outcome: Progressing Goal: Sleeping patterns will improve 12/20/2017 0048 by Clemens Catholic, RN Outcome: Progressing 12/20/2017 0046 by Clemens Catholic, RN Outcome: Progressing   Problem: Coping: Goal: Ability to verbalize frustrations and anger appropriately will improve 12/20/2017 0048 by Clemens Catholic, RN Outcome: Progressing 12/20/2017 0046 by Clemens Catholic, RN Outcome: Progressing Goal: Ability to demonstrate self-control will improve 12/20/2017 0048 by Clemens Catholic, RN Outcome: Progressing 12/20/2017 0046 by Clemens Catholic, RN Outcome: Progressing   Problem: Health  Behavior/Discharge Planning: Goal: Identification of resources available to assist in meeting health care needs will improve 12/20/2017 0048 by Clemens Catholic, RN Outcome: Progressing 12/20/2017 0046 by Clemens Catholic, RN Outcome: Progressing Goal: Compliance with treatment plan for underlying cause of condition will improve 12/20/2017 0048 by Clemens Catholic, RN Outcome: Progressing 12/20/2017 0046 by Clemens Catholic, RN Outcome: Progressing   Problem: Physical Regulation: Goal: Ability to maintain clinical measurements within normal limits will improve 12/20/2017 0048 by  Clemens Catholic, RN Outcome: Progressing 12/20/2017 0046 by Clemens Catholic, RN Outcome: Progressing   Problem: Safety: Goal: Periods of time without injury will increase 12/20/2017 0048 by Clemens Catholic, RN Outcome: Progressing 12/20/2017 0046 by Clemens Catholic, RN Outcome: Progressing   Problem: Education: Goal: Utilization of techniques to improve thought processes will improve 12/20/2017 0048 by Clemens Catholic, RN Outcome: Progressing 12/20/2017 0046 by Clemens Catholic, RN Outcome: Progressing Goal: Knowledge of the prescribed therapeutic regimen will improve 12/20/2017 0048 by Clemens Catholic, RN Outcome: Progressing 12/20/2017 0046 by Clemens Catholic, RN Outcome: Progressing   Problem: Activity: Goal: Interest or engagement in leisure activities will improve 12/20/2017 0048 by Clemens Catholic, RN Outcome: Progressing 12/20/2017 0046 by Clemens Catholic, RN Outcome: Progressing Goal: Imbalance in normal sleep/wake cycle will improve 12/20/2017 0048 by Clemens Catholic, RN Outcome: Progressing 12/20/2017 0046 by Clemens Catholic, RN Outcome: Progressing   Problem: Coping: Goal: Coping ability will improve 12/20/2017 0048 by Clemens Catholic, RN Outcome: Progressing 12/20/2017 0046 by Clemens Catholic, RN Outcome: Progressing Goal: Will verbalize feelings 12/20/2017 0048  by Clemens Catholic, RN Outcome: Progressing 12/20/2017 0046 by Clemens Catholic, RN Outcome: Progressing   Problem: Health Behavior/Discharge Planning: Goal: Ability to make decisions will improve 12/20/2017 0048 by Clemens Catholic, RN Outcome: Progressing 12/20/2017 0046 by Clemens Catholic, RN Outcome: Progressing Goal: Compliance with therapeutic regimen will improve 12/20/2017 0048 by Clemens Catholic, RN Outcome: Progressing 12/20/2017 0046 by Clemens Catholic, RN Outcome: Progressing   Problem: Role Relationship: Goal: Will demonstrate positive changes in social behaviors and relationships 12/20/2017 0048 by Clemens Catholic, RN Outcome: Progressing 12/20/2017 0046 by Clemens Catholic, RN Outcome: Progressing   Problem: Safety: Goal: Ability to disclose and discuss suicidal ideas will improve 12/20/2017 0048 by Clemens Catholic, RN Outcome: Progressing 12/20/2017 0046 by Clemens Catholic, RN Outcome: Progressing Goal: Ability to identify and utilize support systems that promote safety will improve 12/20/2017 0048 by Clemens Catholic, RN Outcome: Progressing 12/20/2017 0046 by Clemens Catholic, RN Outcome: Progressing   Problem: Self-Concept: Goal: Will verbalize positive feelings about self 12/20/2017 0048 by Clemens Catholic, RN Outcome: Progressing 12/20/2017 0046 by Clemens Catholic, RN Outcome: Progressing Goal: Level of anxiety will decrease 12/20/2017 0048 by Clemens Catholic, RN Outcome: Progressing 12/20/2017 0046 by Clemens Catholic, RN Outcome: Progressing   Problem: Education: Goal: Knowledge of [facility] Education information will improve 12/20/2017 0048 by Clemens Catholic, RN Outcome: Progressing 12/20/2017 0046 by Clemens Catholic, RN Outcome: Progressing   Problem: Clinical Measurements: Goal: Ability to maintain clinical measurements within normal limits will improve 12/20/2017 0048 by Clemens Catholic, RN Outcome:  Progressing 12/20/2017 0046 by Clemens Catholic, RN Outcome: Progressing

## 2017-12-20 NOTE — [facility] Group Notes (Signed)
LCSW Group Therapy Notes 12/20/2017 1:00pm Type of Therapy and Topic:  Group Therapy:  Communication Participation Level:  Active  Description of Group: Patients will identify how individuals communicate with one another appropriately and inappropriately.  Patients will be guided to discuss their thoughts, feelings and behaviors related to barriers when communicating.  The group will process together ways to execute positive and appropriate communication with attention given to how one uses behavior, tone and body language.  Patients will be encouraged to reflect on a situation where they were successfully able to communicate and what made this example successful.  Group will identify specific changes they are motivated to make in order to overcome communication barriers with self, peers, authority, and parents.  This group will be process-oriented with patients participating in exploration of their own experiences, giving and receiving support, and challenging self and other group members.   Therapeutic Goals 1. Patient will identify how people communicate (body language, facial expression, and electronics).  Group will also discuss tone, voice and how these impact what is communicated and what is received. 2. Patient will identify feelings (such as fear or worry), thought process and behaviors related to why people internalize feelings rather than express self openly. 3. Patient will identify two changes they are willing to make to overcome communication barriers 4. Members will then practice through role play how to communicate using I statements, I feel statements, and acknowledging feelings rather than displacing feelings on others Summary of Patient Progress: Pt participated in group, able to meet the above therapeutic goals.  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  Therapeutic Modalities Cognitive Behavioral Therapy Motivational Interviewing Solution Focused Therapy  August Saucer, LCSW 12/20/2017 1:56 PM

## 2017-12-21 ENCOUNTER — Inpatient Hospital Stay: Payer: Medicaid Other

## 2017-12-21 ENCOUNTER — Inpatient Hospital Stay: Payer: Medicaid Other | Admitting: Anesthesiology

## 2017-12-21 ENCOUNTER — Other Ambulatory Visit: Payer: Self-pay | Admitting: Psychiatry

## 2017-12-21 LAB — GLUCOSE, CAPILLARY
GLUCOSE-CAPILLARY: 70 mg/dL (ref 70–99)
GLUCOSE-CAPILLARY: 121 mg/dL — AB (ref 70–99)
Glucose-Capillary: 96 mg/dL (ref 70–99)
Glucose-Capillary: 109 mg/dL — ABNORMAL HIGH (ref 70–99)
Glucose-Capillary: 100 mg/dL — ABNORMAL HIGH (ref 70–99)
Glucose-Capillary: 77 mg/dL (ref 70–99)

## 2017-12-21 MED ORDER — KETOROLAC TROMETHAMINE 30 MG/ML IJ SOLN
INTRAMUSCULAR | Status: AC
  Administered 2017-12-21: 30 mg via INTRAVENOUS
  Filled 2017-12-21: qty 1

## 2017-12-21 MED ORDER — SODIUM CHLORIDE 0.9 % IV SOLN
INTRAVENOUS | Status: DC | PRN
  Administered 2017-12-21: 09:00:00 via INTRAVENOUS

## 2017-12-21 MED ORDER — METHOHEXITAL SODIUM 100 MG/10ML IV SOSY
PREFILLED_SYRINGE | INTRAVENOUS | Status: DC | PRN
  Administered 2017-12-21: 120 mg via INTRAVENOUS

## 2017-12-21 MED ORDER — SUCCINYLCHOLINE CHLORIDE 20 MG/ML IJ SOLN
INTRAMUSCULAR | Status: AC
  Filled 2017-12-21: qty 1

## 2017-12-21 MED ORDER — MIDAZOLAM HCL 2 MG/2ML IJ SOLN
INTRAMUSCULAR | Status: DC | PRN
  Administered 2017-12-21: 2 mg via INTRAVENOUS

## 2017-12-21 MED ORDER — MIDAZOLAM HCL 2 MG/2ML IJ SOLN: 2.0000 mg | Freq: Once | INTRAMUSCULAR | Status: DC

## 2017-12-21 MED ORDER — DEXTROSE-NACL 5-0.45 % IV SOLN
INTRAVENOUS | Status: AC
  Administered 2017-12-21: 10:00:00 via INTRAVENOUS

## 2017-12-21 MED ORDER — DEXTROSE 5 % IV SOLN: 500.0000 mL | Freq: Once | INTRAVENOUS | Status: DC

## 2017-12-21 MED ORDER — GLYCOPYRROLATE 0.2 MG/ML IJ SOLN
0.2000 mg | Freq: Once | INTRAMUSCULAR | Status: AC
  Administered 2017-12-21: 0.2 mg via INTRAVENOUS

## 2017-12-21 MED ORDER — GLYCOPYRROLATE 0.2 MG/ML IJ SOLN
INTRAMUSCULAR | Status: AC
  Administered 2017-12-21: 0.2 mg via INTRAVENOUS
  Filled 2017-12-21: qty 1

## 2017-12-21 MED ORDER — SUCCINYLCHOLINE CHLORIDE 200 MG/10ML IV SOSY
PREFILLED_SYRINGE | INTRAVENOUS | Status: DC | PRN
  Administered 2017-12-21: 140 mg via INTRAVENOUS

## 2017-12-21 MED ORDER — KETOROLAC TROMETHAMINE 30 MG/ML IJ SOLN
30.0000 mg | Freq: Once | INTRAMUSCULAR | Status: AC
  Administered 2017-12-21: 30 mg via INTRAVENOUS

## 2017-12-21 MED ORDER — MIDAZOLAM HCL 2 MG/2ML IJ SOLN
INTRAMUSCULAR | Status: AC
  Filled 2017-12-21: qty 2

## 2017-12-21 NOTE — Tx Team (Signed)
Initial Treatment Plan 12/21/2017 4:27 PM [name] VZC:[id number]    PATIENT STRESSORS: Health problems Medication change or noncompliance   PATIENT STRENGTHS: Ability for insight Active sense of humor Average or above average intelligence Capable of independent living Communication skills Supportive family/friends   PATIENT IDENTIFIED PROBLEMS: Depression  12/16/17  Suicidal 12/16/17                   DISCHARGE CRITERIA:  Ability to meet basic life and health needs Improved stabilization in mood, thinking, and/or behavior  PRELIMINARY DISCHARGE PLAN: Outpatient therapy Return to previous living arrangement  PATIENT/FAMILY INVOLVEMENT: This treatment plan has been presented to and reviewed with the patient, Marc Long, and/or family member,    The patient and family have been given the opportunity to ask questions and make suggestions.  Leodis Liverpool, RN 12/21/2017, 4:27 PM

## 2017-12-21 NOTE — Anesthesia Preprocedure Evaluation (Signed)
Anesthesia Evaluation  Patient identified by MRN, date of birth, ID band Patient awake    Reviewed: Allergy & Precautions, NPO status , Patient's Chart, lab work & pertinent test results  History of Anesthesia Complications Negative for: history of anesthetic complications  Airway Mallampati: III       Dental  (+) Dental Advidsory Given   Pulmonary neg sleep apnea, neg COPD,           Cardiovascular (-) hypertension(-) Past MI and (-) CHF (-) dysrhythmias (-) Valvular Problems/Murmurs     Neuro/Psych neg Seizures PSYCHIATRIC DISORDERS Depression Bipolar Disorder    GI/Hepatic Neg liver ROS, neg GERD (resolved with weight loss)  ,  Endo/Other  diabetes, Type 2, Oral Hypoglycemic Agents, Insulin DependentMorbid obesity  Renal/GU Renal disease     Musculoskeletal   Abdominal   Peds  Hematology   Anesthesia Other Findings Past Medical History: No date: Depression No date: Diabetes mellitus without complication (HCC) No date: Gallstones No date: Obesity, Class III, BMI 40-49.9 (morbid obesity) (HCC)   Reproductive/Obstetrics                             Anesthesia Physical  Anesthesia Plan  ASA: III  Anesthesia Plan: General   Post-op Pain Management:    Induction: Intravenous  PONV Risk Score and Plan: TIVA  Airway Management Planned: Mask  Additional Equipment:   Intra-op Plan:   Post-operative Plan:   Informed Consent: I have reviewed the patients History and Physical, chart, labs and discussed the procedure including the risks, benefits and alternatives for the proposed anesthesia with the patient or authorized representative who has indicated his/her understanding and acceptance.     Plan Discussed with:   Anesthesia Plan Comments:         Anesthesia Quick Evaluation

## 2017-12-21 NOTE — [facility] Group Notes (Signed)
[facility] Group Notes:  (Nursing/MHT/Case Management/Adjunct)  Date:  12/21/2017  Time:  9:05 PM  Type of Therapy:  Group Therapy  Participation Level:  Active  Participation Quality:  Appropriate  Affect:  Appropriate  Cognitive:  Appropriate  Insight:  Appropriate  Engagement in Group:  Engaged  Modes of Intervention:  Discussion  Summary of Progress/Problems:  Kandis Fantasia 12/21/2017, 9:05 PM

## 2017-12-21 NOTE — [facility] Group Notes (Signed)
LCSW Group Therapy Note   12/21/2017 1:00pm   Type of Therapy and Topic:  Group Therapy:  Overcoming Obstacles   Participation Level:  Active   Description of Group:    In this group patients will be encouraged to explore what they see as obstacles to their own wellness and recovery. They will be guided to discuss their thoughts, feelings, and behaviors related to these obstacles. The group will process together ways to cope with barriers, with attention given to specific choices patients can make. Each patient will be challenged to identify changes they are motivated to make in order to overcome their obstacles. This group will be process-oriented, with patients participating in exploration of their own experiences as well as giving and receiving support and challenge from other group members.   Therapeutic Goals: 1. Patient will identify personal and current obstacles as they relate to admission. 2. Patient will identify barriers that currently interfere with their wellness or overcoming obstacles.  3. Patient will identify feelings, thought process and behaviors related to these barriers. 4. Patient will identify two changes they are willing to make to overcome these obstacles:      Summary of Patient Progress Pt joined group late as he had ECT this morning.  Able to meet therapeutic goals.  Shares that one of his main obstacles is coming to terms with his past choices and forgiving himself for things he has done.  He shares in group discussion around resiliency and ways to move past obstacle.     Therapeutic Modalities:   Cognitive Behavioral Therapy Solution Focused Therapy Motivational Interviewing Relapse Prevention Therapy  August Saucer, LCSW 12/21/2017 3:56 PM

## 2017-12-21 NOTE — Transfer of Care (Addendum)
Immediate Anesthesia Transfer of Care Note  Patient: Marc Long  Procedure(s) Performed: ECT TX  Patient Location: PACU  Anesthesia Type:General  Level of Consciousness: sedated  Airway & Oxygen Therapy: Patient Spontanous Breathing and Patient connected to face mask oxygen  Post-op Assessment: Report given to RN and Post -op Vital signs reviewed and stable  Post vital signs: Reviewed and stable  Last Vitals:  Vitals Value Taken Time  BP 135/93 12/21/2017 11:06 AM  Temp 36.7 C 12/21/2017 11:06 AM  Pulse 80 12/21/2017 11:08 AM  Resp 26 12/21/2017 11:08 AM  SpO2 97 % 12/21/2017 11:08 AM  Vitals shown include unvalidated device data.  Last Pain:  Vitals:   12/21/17 1106  TempSrc:   PainSc: Asleep      Patients Stated Pain Goal: 2 (20/80/22 3361)  Complications: No apparent anesthesia complications

## 2017-12-21 NOTE — Progress Notes (Signed)
Writer  Notified Dr. Weber Cooks  For Blood sugar 96 this am prior to going to ECT . Informed not to give Insulin , will monitor in ECT.

## 2017-12-21 NOTE — Plan of Care (Signed)
Patient is improving in all areas, compliant with his  care of ADLs and medicines , sleeping long hours with out any interruptions and maintaining safety in the unit, patient expresses no concerns, coping is adequate, 15 minute safety is maintained. Problem: Education: Goal: Knowledge of [place] [facility] Education information/materials will improve Outcome: Progressing Goal: Emotional status will improve Outcome: Progressing Goal: Mental status will improve Outcome: Progressing Goal: Verbalization of understanding the information provided will improve Outcome: Progressing   Problem: Coping: Goal: Coping ability will improve Outcome: Progressing   Problem: Self-Concept: Goal: Ability to disclose and discuss suicidal ideas will improve Outcome: Progressing   Problem: Activity: Goal: Interest or engagement in leisure activities will improve Outcome: Progressing Goal: Imbalance in normal sleep/wake cycle will improve Outcome: Progressing   Problem: Self-Concept: Goal: Will verbalize positive feelings about self Outcome: Progressing Goal: Level of anxiety will decrease Outcome: Progressing   Problem: Education: Goal: Knowledge of [place] [facility] Education information/materials will improve Outcome: Progressing Goal: Emotional status will improve Outcome: Progressing Goal: Mental status will improve Outcome: Progressing Goal: Verbalization of understanding the information provided will improve Outcome: Progressing   Problem: Activity: Goal: Interest or engagement in activities will improve Outcome: Progressing Goal: Sleeping patterns will improve Outcome: Progressing   Problem: Coping: Goal: Ability to verbalize frustrations and anger appropriately will improve Outcome: Progressing Goal: Ability to demonstrate self-control will improve Outcome: Progressing   Problem: Health Behavior/Discharge Planning: Goal: Identification of resources available to assist in  meeting health care needs will improve Outcome: Progressing Goal: Compliance with treatment plan for underlying cause of condition will improve Outcome: Progressing   Problem: Physical Regulation: Goal: Ability to maintain clinical measurements within normal limits will improve Outcome: Progressing   Problem: Safety: Goal: Periods of time without injury will increase Outcome: Progressing   Problem: Education: Goal: Utilization of techniques to improve thought processes will improve Outcome: Progressing Goal: Knowledge of the prescribed therapeutic regimen will improve Outcome: Progressing   Problem: Activity: Goal: Interest or engagement in leisure activities will improve Outcome: Progressing Goal: Imbalance in normal sleep/wake cycle will improve Outcome: Progressing   Problem: Coping: Goal: Coping ability will improve Outcome: Progressing Goal: Will verbalize feelings Outcome: Progressing   Problem: Health Behavior/Discharge Planning: Goal: Ability to make decisions will improve Outcome: Progressing Goal: Compliance with therapeutic regimen will improve Outcome: Progressing   Problem: Role Relationship: Goal: Will demonstrate positive changes in social behaviors and relationships Outcome: Progressing   Problem: Safety: Goal: Ability to disclose and discuss suicidal ideas will improve Outcome: Progressing Goal: Ability to identify and utilize support systems that promote safety will improve Outcome: Progressing   Problem: Self-Concept: Goal: Will verbalize positive feelings about self Outcome: Progressing Goal: Level of anxiety will decrease Outcome: Progressing   Problem: Education: Goal: Knowledge of [facility] Education information will improve Outcome: Progressing   Problem: Clinical Measurements: Goal: Ability to maintain clinical measurements within normal limits will improve Outcome: Progressing

## 2017-12-21 NOTE — Progress Notes (Signed)
Inpatient Diabetes Program Recommendations  AACE/ADA: New Consensus Statement on Inpatient Glycemic Control (2015)  Target Ranges:  Prepandial:   less than 140 mg/dL      Peak postprandial:   less than 180 mg/dL (1-2 hours)      Critically ill patients:  140 - 180 mg/dL   Lab Results  Component Value Date   GLUCAP 96 12/21/2017   HGBA1C 16.7 (H) 12/09/2017    Review of Glycemic ControlResults for KYRE, JEFFRIES (MRN [id number]) as of 12/21/2017 12:47  Ref. Range 12/20/2017 07:05 12/20/2017 09:47 12/20/2017 11:27 12/20/2017 16:06 12/20/2017 20:15 12/20/2017 20:41 12/20/2017 22:06 12/21/2017 07:00  Glucose-Capillary Latest Ref Range: 70 - 99 mg/dL 91 160 (H) 102 (H) 76 55 (L) 52 (L) 107 (H) 96   Diabetes history: Type 2 DM Outpatient Diabetes medications:  Novolog 10 units tid with meals, Lantus 25 units daily, Metformin 1000 mg bid Current orders for Inpatient glycemic control:  Novolog sensitive tid with meals, Lantus 25 units daily, Novolog 10 units tid with meals Inpatient Diabetes Program Recommendations:   Please reduce Lantus to 20 units daily and reduce Novolog meal coverage to 6 units tid with meals.  Discussed with RN.   Thanks,  Adah Perl, RN, BC-ADM Inpatient Diabetes Coordinator Pager [phone] (8a-5p)

## 2017-12-21 NOTE — Progress Notes (Signed)
Recreation Therapy Notes  Date: 12/21/2017  Time: 9:30 am   Location: Craft Room   Behavioral response: N/A   Intervention Topic:  Problem Solving  Discussion/Intervention: Patient did not attend group.   Clinical Observations/Feedback:  Patient did not attend group.   Herbert Aguinaldo LRT/CTRS        Vladislav Axelson 12/21/2017 10:18 AM

## 2017-12-21 NOTE — Anesthesia Procedure Notes (Signed)
Date/Time: 12/21/2017 10:56 AM Performed by: Dionne Bucy, CRNA Pre-anesthesia Checklist: Patient identified, Emergency Drugs available, Suction available and Patient being monitored Patient Re-evaluated:Patient Re-evaluated prior to induction Oxygen Delivery Method: Circle system utilized Preoxygenation: Pre-oxygenation with 100% oxygen Induction Type: IV induction Ventilation: Mask ventilation without difficulty and Mask ventilation throughout procedure Airway Equipment and Method: Bite block Placement Confirmation: positive ETCO2 Dental Injury: Teeth and Oropharynx as per pre-operative assessment

## 2017-12-21 NOTE — Progress Notes (Signed)
[facility] MD Progress Note  12/21/2017 8:46 PM Marc Long  MRN:  [id number] Subjective: Follow-up 42 year old man with a history of depression.  Patient had his second ECT treatment today.  Tolerated without difficulty.  He did have some soreness after the last treatment so we are going to try and make sure we manage his pain well.  Mood continues to be depressed and dysphoric with some passive suicidal thoughts. Principal Problem: Severe recurrent major depression without psychotic features ([place]) Diagnosis:   Patient Active Problem List   Diagnosis Date Noted  . Severe recurrent major depression without psychotic features ([place]) [F33.2] 12/16/2017  . MDD (major depressive disorder), recurrent episode, severe ([place]) [F33.2] 12/10/2017  . Obesity (BMI 30-39.9) [E66.9] 12/09/2017  . Depression with suicidal ideation [F32.9, R45.851]   . DKA (diabetic ketoacidoses) ([place]) [E13.10] 12/05/2017  . Hypertriglyceridemia [E78.1] 02/05/2017  . DM (diabetes mellitus), type 2, uncontrolled ([place]) [E11.65] 12/12/2016  . Dehydration [E86.0] 12/12/2016  . Suicidal ideations [R45.851] 12/12/2016  . Bipolar disorder (manic depression) ([place]) [F31.9] 12/12/2016  . Hyperglycemia due to type 2 diabetes mellitus ([place]) [E11.65] 12/12/2016  . AKI (acute kidney injury) ([place]) [N17.9] 12/12/2016  . DKA, type 2, not at goal [facility]) [E11.10] 12/12/2016  . Obesity, Class III, BMI 40-49.9 (morbid obesity) ([place]) [E66.01]   . Type 2 diabetes mellitus without complication, without long-term current use of insulin ([place]) [E11.9] 12/10/2016  . Lithium use [Z79.899] 12/10/2016  . Moderate single current episode of major depressive disorder ([place]) [F32.1] 04/15/2016  . Pain in joint, shoulder region [M25.519] 04/14/2016  . Diverticulosis of large intestine without hemorrhage [K57.30] 11/07/2015  . Calculus of gallbladder without cholecystitis [K80.20] 11/07/2015   Total Time spent with patient: 30 minutes  Past Psychiatric  History: History of recurrent depression episodes with poor function  Past Medical History:  Past Medical History:  Diagnosis Date  . Depression   . Diabetes mellitus without complication ([place])   . Gallstones   . Obesity, Class III, BMI 40-49.9 (morbid obesity) ([place])    History reviewed. No pertinent surgical history. Family History:  Family History  Problem Relation Age of Onset  . Hypertension Mother   . Depression Mother   . Heart attack Maternal Grandfather   . Lymphoma Maternal Grandmother   . Diabetes Maternal Aunt   . Cancer Maternal Aunt    Family Psychiatric  History: None Social History:  Social History   Substance and Sexual Activity  Alcohol Use Yes  . Alcohol/week: 0.0 oz   Comment: once every 2 weeks. Wine coolers     Social History   Substance and Sexual Activity  Drug Use No    Social History   Socioeconomic History  . Marital status: Single    Spouse name: Not on file  . Number of children: 0  . Years of education: Not on file  . Highest education level: Not on file  Occupational History  . Occupation: call center  Social Needs  . Financial resource strain: Not on file  . Food insecurity:    Worry: Not on file    Inability: Not on file  . Transportation needs:    Medical: Not on file    Non-medical: Not on file  Tobacco Use  . Smoking status: Never Smoker  . Smokeless tobacco: Never Used  Substance and Sexual Activity  . Alcohol use: Yes    Alcohol/week: 0.0 oz    Comment: once every 2 weeks. Wine coolers  . Drug use: No  .  Sexual activity: Not Currently    Birth control/protection: None  Lifestyle  . Physical activity:    Days per week: Not on file    Minutes per session: Not on file  . Stress: Not on file  Relationships  . Social connections:    Talks on phone: Not on file    Gets together: Not on file    Attends religious service: Not on file    Active member of club or organization: Not on file    Attends meetings of clubs  or organizations: Not on file    Relationship status: Not on file  Other Topics Concern  . Not on file  Social History Narrative  . Not on file   Additional Social History:                         Sleep: Fair  Appetite:  Fair  Current Medications: Current Facility-Administered Medications  Medication Dose Route Frequency Provider Last Rate Last Dose  . acetaminophen (TYLENOL) tablet 650 mg  650 mg Oral Q6H PRN Romyn Boswell, Madie Reno, MD   650 mg at 12/18/17 1347  . alum & mag hydroxide-simeth (MAALOX/MYLANTA) 200-200-20 MG/5ML suspension 30 mL  30 mL Oral Q4H PRN Kang Ishida, Raijon T, MD      . busPIRone (BUSPAR) tablet 10 mg  10 mg Oral TID Verner Mccrone, Madie Reno, MD   10 mg at 12/21/17 1619  . feeding supplement (GLUCERNA SHAKE) (GLUCERNA SHAKE) liquid 237 mL  237 mL Oral TID BM Dwayne Bulkley, Madie Reno, MD   237 mL at 12/20/17 2122  . FLUoxetine (PROZAC) capsule 40 mg  40 mg Oral Daily Ermelinda Eckert, Itai T, MD   40 mg at 12/21/17 1316  . hydrocortisone cream 1 %   Topical BID PRN Kaede Clendenen, Jakson T, MD      . hydrOXYzine (ATARAX/VISTARIL) tablet 50 mg  50 mg Oral TID PRN Donnamaria Shands, Vitaliy T, MD      . ibuprofen (ADVIL,MOTRIN) tablet 800 mg  800 mg Oral Q6H PRN Lenward Chancellor, MD   800 mg at 12/20/17 1616  . insulin aspart (novoLOG) injection 0-9 Units  0-9 Units Subcutaneous TID WC Kaylia Winborne, Madie Reno, MD   2 Units at 12/20/17 1003  . insulin aspart (novoLOG) injection 10 Units  10 Units Subcutaneous TID WC Nayara Taplin, Madie Reno, MD   10 Units at 12/20/17 1700  . insulin glargine (LANTUS) injection 25 Units  25 Units Subcutaneous Daily Paxton Kanaan, Madie Reno, MD   25 Units at 12/20/17 0815  . magnesium hydroxide (MILK OF MAGNESIA) suspension 30 mL  30 mL Oral Daily PRN Jackelyn Illingworth, Phil T, MD      . metFORMIN (GLUCOPHAGE) tablet 1,000 mg  1,000 mg Oral BID WC Jacquline Terrill, Viola T, MD   1,000 mg at 12/21/17 1318  . midazolam (VERSED) injection 2 mg  2 mg Intravenous Once Valarie Farace, Zeric T, MD      . midazolam (VERSED) injection 2 mg  2  mg Intravenous Once Bambie Pizzolato, Mostyn T, MD      . multivitamin with minerals tablet 1 tablet  1 tablet Oral Daily Rochelle Larue, Madie Reno, MD   1 tablet at 12/21/17 1325  . ondansetron (ZOFRAN) injection 4 mg  4 mg Intravenous Once PRN Gunnar Fusi, MD      . pantoprazole (PROTONIX) EC tablet 40 mg  40 mg Oral Daily Flemon Kelty, Madie Reno, MD   40 mg at 12/21/17 1324  . polyethylene glycol (MIRALAX / GLYCOLAX)  packet 17 g  17 g Oral Daily Ottie Neglia, Madie Reno, MD   17 g at 12/19/17 8366  . QUEtiapine (SEROQUEL) tablet 50 mg  50 mg Oral QHS Robbert Langlinais, Madie Reno, MD   50 mg at 12/20/17 2123  . traZODone (DESYREL) tablet 100 mg  100 mg Oral QHS PRN Carlee Tesfaye, Madie Reno, MD   100 mg at 12/18/17 2058  . traZODone (DESYREL) tablet 50 mg  50 mg Oral QHS Onyekachi Gathright, Madie Reno, MD   50 mg at 12/20/17 2123    Lab Results:  Results for orders placed or performed during the hospital encounter of 12/16/17 (from the past 48 hour(s))  Glucose, capillary     Status: None   Collection Time: 12/20/17  7:05 AM  Result Value Ref Range   Glucose-Capillary 91 70 - 99 mg/dL   Comment 1 Notify RN   Glucose, capillary     Status: Abnormal   Collection Time: 12/20/17  9:47 AM  Result Value Ref Range   Glucose-Capillary 160 (H) 70 - 99 mg/dL  Glucose, capillary     Status: Abnormal   Collection Time: 12/20/17 11:27 AM  Result Value Ref Range   Glucose-Capillary 102 (H) 70 - 99 mg/dL   Comment 1 Notify RN   Glucose, capillary     Status: None   Collection Time: 12/20/17  4:06 PM  Result Value Ref Range   Glucose-Capillary 76 70 - 99 mg/dL  Glucose, capillary     Status: Abnormal   Collection Time: 12/20/17  8:15 PM  Result Value Ref Range   Glucose-Capillary 55 (L) 70 - 99 mg/dL  Glucose, capillary     Status: Abnormal   Collection Time: 12/20/17  8:41 PM  Result Value Ref Range   Glucose-Capillary 52 (L) 70 - 99 mg/dL  Glucose, capillary     Status: Abnormal   Collection Time: 12/20/17 10:06 PM  Result Value Ref Range    Glucose-Capillary 107 (H) 70 - 99 mg/dL  Glucose, capillary     Status: None   Collection Time: 12/21/17  7:00 AM  Result Value Ref Range   Glucose-Capillary 96 70 - 99 mg/dL   Comment 1 Notify RN   Glucose, capillary     Status: Abnormal   Collection Time: 12/21/17 12:49 PM  Result Value Ref Range   Glucose-Capillary 109 (H) 70 - 99 mg/dL   Comment 1 Notify RN   Glucose, capillary     Status: Abnormal   Collection Time: 12/21/17  4:17 PM  Result Value Ref Range   Glucose-Capillary 100 (H) 70 - 99 mg/dL   Comment 1 Document in Chart   Glucose, capillary     Status: None   Collection Time: 12/21/17  8:23 PM  Result Value Ref Range   Glucose-Capillary 70 70 - 99 mg/dL   Comment 1 Notify RN     Blood Alcohol level:  Lab Results  Component Value Date   ETH <10 29/47/6546    Metabolic Disorder Labs: Lab Results  Component Value Date   HGBA1C 16.7 (H) 12/09/2017   MPG 432.59 12/09/2017   No results found for: PROLACTIN Lab Results  Component Value Date   CHOL 142 03/05/2017   TRIG 148 03/05/2017   HDL 27 (L) 03/05/2017   CHOLHDL 5.3 (H) 03/05/2017   LDLCALC 85 03/05/2017   [place] Comment 12/10/2016    Physical Findings: AIMS:  , ,  ,  ,    CIWA:    COWS:  Musculoskeletal: Strength & Muscle Tone: within normal limits Gait & Station: normal Patient leans: N/A  Psychiatric Specialty Exam: Physical Exam  Nursing note and vitals reviewed. Constitutional: He appears well-developed and well-nourished.  HENT:  Head: Normocephalic and atraumatic.  Eyes: Pupils are equal, round, and reactive to light. Conjunctivae are normal.  Neck: Normal range of motion.  Cardiovascular: Regular rhythm and normal heart sounds.  Respiratory: Effort normal. No respiratory distress.  GI: Soft.  Musculoskeletal: Normal range of motion.  Neurological: He is alert.  Skin: Skin is warm and dry.  Psychiatric: Judgment normal. His affect is blunt. His speech is delayed. He is  slowed. Cognition and memory are normal. He expresses suicidal ideation. He expresses no suicidal plans.    Review of Systems  Constitutional: Negative.   HENT: Negative.   Eyes: Negative.   Respiratory: Negative.   Cardiovascular: Negative.   Gastrointestinal: Negative.   Musculoskeletal: Positive for myalgias.  Skin: Negative.   Neurological: Negative.   Psychiatric/Behavioral: Positive for depression and suicidal ideas. Negative for hallucinations, memory loss and substance abuse. The patient is nervous/anxious. The patient does not have insomnia.     Blood pressure (!) 128/97, pulse 74, temperature 99 F (37.2 C), temperature source Oral, resp. rate 18, height 5\' 11"  (1.803 m), weight 124.3 kg (274 lb), SpO2 100 %.Body mass index is 38.22 kg/m.  General Appearance: Casual  Eye Contact:  Fair  Speech:  Normal Rate  Volume:  Decreased  Mood:  Dysphoric  Affect:  Congruent  Thought Process:  Goal Directed  Orientation:  Full (Time, Place, and Person)  Thought Content:  Logical  Suicidal Thoughts:  Yes.  without intent/plan  Homicidal Thoughts:  No  Memory:  Immediate;   Fair Recent;   Fair Remote;   Fair  Judgement:  Fair  Insight:  Fair  Psychomotor Activity:  Decreased  Concentration:  Concentration: Fair  Recall:  [employer] of Knowledge:  Fair  Language:  Fair  Akathisia:  No  Handed:  Right  AIMS (if indicated):     Assets:  Desire for Improvement Social Support  ADL's:  Intact  Cognition:  WNL  Sleep:  Number of Hours: 6.5     Treatment Plan Summary: Daily contact with patient to assess and evaluate symptoms and progress in treatment, Medication management and Plan Continue plan for ECT Next treatment Wednesday.  No change to current medication for psychiatric illness.  It has been noted over the weekend and today that his blood sugars are consistently running low even without insulin.  This has been concerning enough that we are going to hold insulin for now  and see how he does without any of it before restarting.  Alethia Berthold, MD 12/21/2017, 8:47 PM

## 2017-12-21 NOTE — Anesthesia Post-op Follow-up Note (Signed)
Anesthesia QCDR form completed.        

## 2017-12-21 NOTE — Anesthesia Postprocedure Evaluation (Signed)
Anesthesia Post Note  Patient: Marc Long  Procedure(s) Performed: ECT TX  Patient location during evaluation: PACU Anesthesia Type: General Level of consciousness: awake and alert Pain management: pain level controlled Vital Signs Assessment: post-procedure vital signs reviewed and stable Respiratory status: spontaneous breathing, nonlabored ventilation, respiratory function stable and patient connected to nasal cannula oxygen Cardiovascular status: blood pressure returned to baseline and stable Postop Assessment: no apparent nausea or vomiting Anesthetic complications: no     Last Vitals:  Vitals:   12/21/17 1136 12/21/17 1146  BP: 124/87 125/89  Pulse: 65 69  Resp: (!) 25 16  Temp:  36.9 C  SpO2: 91% 93%    Last Pain:  Vitals:   12/21/17 1146  TempSrc:   PainSc: 0-No pain                 Martha Clan

## 2017-12-22 ENCOUNTER — Other Ambulatory Visit: Payer: Self-pay | Admitting: Psychiatry

## 2017-12-22 LAB — GLUCOSE, CAPILLARY
GLUCOSE-CAPILLARY: 105 mg/dL — AB (ref 70–99)
GLUCOSE-CAPILLARY: 101 mg/dL — AB (ref 70–99)
GLUCOSE-CAPILLARY: 95 mg/dL (ref 70–99)
Glucose-Capillary: 142 mg/dL — ABNORMAL HIGH (ref 70–99)

## 2017-12-22 MED ORDER — QUETIAPINE FUMARATE 100 MG PO TABS
100.0000 mg | ORAL_TABLET | Freq: Every day | ORAL | Status: DC
  Administered 2017-12-22 – 2017-12-25 (×4): 100 mg via ORAL
  Filled 2017-12-22 (×4): qty 1

## 2017-12-22 NOTE — Plan of Care (Addendum)
Patient found in day room upon my arrival. Patient is visible and social this evening. Patient is calm and cooperative, polite and pleasant. Reports continued depression. Affect congruent. Reports disturbing VH that he describes as "intense." Denies SI/HI/AH. Denies pain. Reports eating and voiding adequately. CBG 95. Compliant with HS medications and staff direction. Given PRN Trazodone for sleep and Vistaril for anxiety, both with positive results. Reminded of NPO after midnight status. Verbalized understanding. Q 15 minute checks maintained. Will continue to monitor throughout the shift. Patient slept 7.25 minutes. No apparent distress. CBG 117. Will endorse care to oncoming shift.   Problem: Education: Goal: Knowledge of [place] [facility] Education information/materials will improve Outcome: Progressing Goal: Emotional status will improve Outcome: Progressing Goal: Mental status will improve Outcome: Progressing Goal: Verbalization of understanding the information provided will improve Outcome: Progressing   Problem: Activity: Goal: Sleeping patterns will improve Outcome: Progressing   Problem: Coping: Goal: Ability to demonstrate self-control will improve Outcome: Progressing

## 2017-12-22 NOTE — BHH Group Notes (Signed)
[place] Group Notes:  (Nursing/MHT/Case Management/Adjunct)  Date:  12/22/2017  Time:  9:25 PM  Type of Therapy:  Group Therapy  Participation Level:  Active  Participation Quality:  Appropriate and Attentive  Affect:  Appropriate  Cognitive:  Appropriate  Insight:  Improving  Engagement in Group:  Engaged  Modes of Intervention:  Discussion  Summary of Progress/Problems: MHT reviewed rules and expectations of the unit. MHT reminded patients of visiting and phone hours. MHT asked that all patients cover themselves throughout the night because there would be routine checks. MHT informed patients of the call at [facility] for vitals. MHT reminded patients to complete their daily checklist forms. MHT made introduction and modeled how to introduce and inform group of daily goal. MHT reviewed topic from sessions on this day (self-esteem). MHT processed with group about letting go of the past and focusing on a better future. MHT reminded patients they could not change the past, only learn from it. MHT processed with patients about self-forgiveness. MHT encouraged patients to not worry about the things they have no control over and focus on the things they can change. MHT informed patients they could not do the same things and expect a different outcome. MHT encouraged patients to focus on the present and make changes that will move them in a positive direction. Marc Long reported his goal was to forgive himself on this day. Marc Long stated he had not forgave himself. Marc Long was attentive throughout group. Marc Long stated he understood about letting go of the past in order to move on in a positive manner in the future. [name] 12/22/2017, 9:25 PM

## 2017-12-22 NOTE — Progress Notes (Signed)
Recreation Therapy Notes  Date: 12/22/2017  Time: 9:30 am  Location: Craft Room  Behavioral response: Appropriate   Intervention Topic: Self-esteem  Discussion/Intervention:  Group content today was focused on self-esteem. Patient defined self-esteem and where it comes form. The group described reasons self-esteem is important. Individuals stated things that impact self-esteem and positive ways to improve self-esteem. The group participated in the intervention "Improving Me" where patients were able to create a collage of positive things that makes them who they are. Clinical Observations/Feedback:  Patient came to group and defined self-esteem as how you feel about your self. He stated that life circumstances and failure/success normally affects his self esteem. Individual was social with peers and staff while participating in the intervention.  Christyl Osentoski LRT/CTRS         Mylo Driskill 12/22/2017 11:20 AM

## 2017-12-22 NOTE — Progress Notes (Signed)
D: Patient stated slept good last night .Stated appetite is good and energy level  Is normal. Stated concentration is good . Stated on Depression scale 6, hopeless 5 and anxiety 5 .( low 0-10 high) Denies suicidal  homicidal ideations  .  No auditory hallucinations  No pain concerns . Appropriate ADL'S. Interacting with peers and staff. Patient  knowledgeable of information received verbalize understanding .  Mental  and emotional status improved  Continue to work with resources available ECT  for ongoing  improvement  in mental status.  No  safety concerns . Continue to work on Scientific laboratory technician  and decision making  . Attending  unit programing  Compliant  with medication  and understanding of  what they are used for .  Voice no concerns around  sleep thoughts of suicide but no plan .  A: Encourage patient participation with unit programming . Instruction  Given on  Medication , verbalize understanding.  R: Voice no other concerns. Staff continue to monitor

## 2017-12-22 NOTE — Plan of Care (Signed)
Patient  knowledgeable of information received verbalize understanding .  Mental  and emotional status improved  Continue to work with resources available ECT  for ongoing  improvement  in mental status.  No  safety concerns . Continue to work on Scientific laboratory technician  and decision making  . Attending  unit programing  Compliant  with medication  and understanding of  what they are used for .  Voice no concerns around  sleep thoughts of suicide but no plan .   Problem: Self-Concept: Goal: Will verbalize positive feelings about self Outcome: Progressing Goal: Level of anxiety will decrease Outcome: Progressing   Problem: Health Behavior/Discharge Planning: Goal: Ability to make decisions will improve Outcome: Progressing Goal: Compliance with therapeutic regimen will improve Outcome: Progressing   Problem: Education: Goal: Utilization of techniques to improve thought processes will improve Outcome: Progressing Goal: Knowledge of the prescribed therapeutic regimen will improve Outcome: Progressing   Problem: Self-Concept: Goal: Will verbalize positive feelings about self Outcome: Progressing Goal: Level of anxiety will decrease Outcome: Progressing   Problem: Activity: Goal: Interest or engagement in leisure activities will improve Outcome: Progressing Goal: Imbalance in normal sleep/wake cycle will improve Outcome: Progressing   Problem: Self-Concept: Goal: Ability to disclose and discuss suicidal ideas will improve Outcome: Progressing   Problem: Coping: Goal: Coping ability will improve Outcome: Progressing   Problem: Physical Regulation: Goal: Ability to maintain clinical measurements within normal limits will improve Outcome: Progressing   Problem: Health Behavior/Discharge Planning: Goal: Identification of resources available to assist in meeting health care needs will improve Outcome: Progressing Goal: Compliance with treatment plan for underlying cause of condition  will improve Outcome: Progressing   Problem: Coping: Goal: Ability to verbalize frustrations and anger appropriately will improve Outcome: Progressing Goal: Ability to demonstrate self-control will improve Outcome: Progressing   Problem: Education: Goal: Knowledge of [place] [facility] Education information/materials will improve Outcome: Progressing Goal: Emotional status will improve Outcome: Progressing Goal: Mental status will improve Outcome: Progressing Goal: Verbalization of understanding the information provided will improve Outcome: Progressing   Problem: Education: Goal: Knowledge of [place] [facility] Education information/materials will improve Outcome: Progressing Goal: Emotional status will improve Outcome: Progressing Goal: Mental status will improve Outcome: Progressing Goal: Verbalization of understanding the information provided will improve Outcome: Progressing

## 2017-12-22 NOTE — BHH Group Notes (Signed)
CSW Group Therapy Note  12/22/2017  Time:  0900  Type of Therapy and Topic: Group Therapy: Goals Group: SMART Goals    Participation Level:  Active    Description of Group:   The purpose of a daily goals group is to assist and guide patients in setting recovery/wellness-related goals. The objective is to set goals as they relate to the crisis in which they were admitted. Patients will be using SMART goal modalities to set measurable goals. Characteristics of realistic goals will be discussed and patients will be assisted in setting and processing how one will reach their goal. Facilitator will also assist patients in applying interventions and coping skills learned in psycho-education groups to the SMART goal and process how one will achieve defined goal.    Therapeutic Goals:  -Patients will develop and document one goal related to or their crisis in which brought them into treatment.  -Patients will be guided by LCSW using SMART goal setting modality in how to set a measurable, attainable, realistic and time sensitive goal.  -Patients will process barriers in reaching goal.  -Patients will process interventions in how to overcome and successful in reaching goal.    Patient's Goal:  Pt continues to work towards their tx goals but has not yet reached them. Pt was able to appropriately participate in group discussion, and was able to offer support/validation to other group members. Pt reported his goal for today is, "to learn how to forgive myself by writing at least one letter to myself by the end of the day."    Therapeutic Modalities:  Motivational Interviewing  Cognitive Behavioral Therapy  Crisis Intervention Model  SMART goals setting  Alden Hipp, MSW, LCSW Clinical Social Worker 12/22/2017 9:48 AM

## 2017-12-22 NOTE — Plan of Care (Signed)
Observed in the day room socializing with peers, pleasant to engage, "I came in because of my depression because Life just Lost me, I was going to use my roommate's gun but he removed it from where I could find it; I cut ties with my girlfriend and family; I decided that I will just let my diabetes take care of me; I stopped checking my blood sugar; I ate whatever I wanted even when I know I shouldn't.. My blood glucose was 400 when I was admitted. I think the ECT will work, no complaints except for slight headaches and soreness..." Raising an eyebrow when he told me his last CBG was 70 @ 2030; he was treated as if hypoglycemic and CBG=77 and again prior to going to bed 121. Pleasant, unkempt bears, peer to peer interaction appropriate, good sense of humor; denied SI/HI/AVH now.  Patient slept for Estimated Hours of 6.30; Precautionary checks every 15 minutes for safety maintained, room free of safety hazards, patient sustains no injury or falls during this shift.  Problem: Education: Goal: Emotional status will improve Outcome: Progressing Goal: Mental status will improve Outcome: Progressing Goal: Verbalization of understanding the information provided will improve Outcome: Progressing   Problem: Activity: Goal: Interest or engagement in activities will improve Outcome: Progressing Goal: Sleeping patterns will improve Outcome: Progressing   Problem: Coping: Goal: Ability to verbalize frustrations and anger appropriately will improve Outcome: Progressing   Problem: Safety: Goal: Periods of time without injury will increase Outcome: Progressing

## 2017-12-22 NOTE — [facility] Group Notes (Signed)
12/22/2017 1PM  Type of Therapy/Topic:  Group Therapy:  Feelings about Diagnosis  Participation Level:  Active   Description of Group:   This group will allow patients to explore their thoughts and feelings about diagnoses they have received. Patients will be guided to explore their level of understanding and acceptance of these diagnoses. Facilitator will encourage patients to process their thoughts and feelings about the reactions of others to their diagnosis and will guide patients in identifying ways to discuss their diagnosis with significant others in their lives. This group will be process-oriented, with patients participating in exploration of their own experiences, giving and receiving support, and processing challenge from other group members.   Therapeutic Goals: 1. Patient will demonstrate understanding of diagnosis as evidenced by identifying two or more symptoms of the disorder 2. Patient will be able to express two feelings regarding the diagnosis 3. Patient will demonstrate their ability to communicate their needs through discussion and/or role play  Summary of Patient Progress: Actively and appropriately engaged in the group. Patient was able to provide support and validation to other group members.Patient practiced active listening when interacting with the facilitator and other group members. Marc Long about his symptom of depression. "I'll isolate myself. I'm at the acceptance stage now and doing what I can to get better."  Patient is still in the process of obtaining treatment goals.        Therapeutic Modalities:   Cognitive Behavioral Therapy Brief Therapy Feelings Identification    Marc Long, [name] 12/22/2017 2:38 PM

## 2017-12-22 NOTE — Tx Team (Signed)
Interdisciplinary Treatment and Diagnostic Plan Update  12/22/2017 Time of Session: [place] MRN: [id number]  Principal Diagnosis: Severe recurrent major depression without psychotic features [facility])  Secondary Diagnoses: Principal Problem:   Severe recurrent major depression without psychotic features ([place]) Active Problems:   Type 2 diabetes mellitus without complication, without long-term current use of insulin (HCC)   Current Medications:  Current Facility-Administered Medications  Medication Dose Route Frequency Provider Last Rate Last Dose  . acetaminophen (TYLENOL) tablet 650 mg  650 mg Oral Q6H PRN Clapacs, Madie Reno, MD   650 mg at 12/18/17 1347  . alum & mag hydroxide-simeth (MAALOX/MYLANTA) 200-200-20 MG/5ML suspension 30 mL  30 mL Oral Q4H PRN Clapacs, Cashius T, MD      . busPIRone (BUSPAR) tablet 10 mg  10 mg Oral TID Clapacs, Madie Reno, MD   10 mg at 12/22/17 1144  . feeding supplement (GLUCERNA SHAKE) (GLUCERNA SHAKE) liquid 237 mL  237 mL Oral TID BM Clapacs, Raydell T, MD   237 mL at 12/22/17 1012  . FLUoxetine (PROZAC) capsule 40 mg  40 mg Oral Daily Clapacs, Madie Reno, MD   40 mg at 12/22/17 0809  . hydrocortisone cream 1 %   Topical BID PRN Clapacs, Winthrop T, MD      . hydrOXYzine (ATARAX/VISTARIL) tablet 50 mg  50 mg Oral TID PRN Clapacs, Madie Reno, MD   50 mg at 12/22/17 1143  . ibuprofen (ADVIL,MOTRIN) tablet 800 mg  800 mg Oral Q6H PRN Lenward Chancellor, MD   800 mg at 12/20/17 1616  . insulin aspart (novoLOG) injection 0-9 Units  0-9 Units Subcutaneous TID WC Clapacs, Madie Reno, MD   1 Units at 12/22/17 1144  . magnesium hydroxide (MILK OF MAGNESIA) suspension 30 mL  30 mL Oral Daily PRN Clapacs, Osborn T, MD      . metFORMIN (GLUCOPHAGE) tablet 1,000 mg  1,000 mg Oral BID WC Clapacs, Verner T, MD   1,000 mg at 12/22/17 0809  . midazolam (VERSED) injection 2 mg  2 mg Intravenous Once Clapacs, Beuford T, MD      . midazolam (VERSED) injection 2 mg  2 mg Intravenous Once Clapacs,  Stephanie T, MD      . multivitamin with minerals tablet 1 tablet  1 tablet Oral Daily Clapacs, Madie Reno, MD   1 tablet at 12/22/17 0809  . ondansetron (ZOFRAN) injection 4 mg  4 mg Intravenous Once PRN Gunnar Fusi, MD      . pantoprazole (PROTONIX) EC tablet 40 mg  40 mg Oral Daily Clapacs, Madie Reno, MD   40 mg at 12/22/17 0809  . polyethylene glycol (MIRALAX / GLYCOLAX) packet 17 g  17 g Oral Daily Clapacs, Madie Reno, MD   17 g at 12/19/17 6294  . QUEtiapine (SEROQUEL) tablet 50 mg  50 mg Oral QHS Clapacs, Madie Reno, MD   50 mg at 12/21/17 2121  . traZODone (DESYREL) tablet 100 mg  100 mg Oral QHS PRN Clapacs, Madie Reno, MD   100 mg at 12/18/17 2058  . traZODone (DESYREL) tablet 50 mg  50 mg Oral QHS Clapacs, Madie Reno, MD   50 mg at 12/21/17 2121   PTA Medications: Medications Prior to Admission  Medication Sig Dispense Refill Last Dose  . busPIRone (BUSPAR) 10 MG tablet Take 10 mg by mouth 3 (three) times daily.  1 12/20/2017  . FLUoxetine (PROZAC) 40 MG capsule Take 1 capsule (40 mg total) by mouth daily.  3 12/20/2017  .  hydrocortisone cream 1 % Apply topically 2 (two) times daily as needed for itching. 30 g 0 12/20/2017  . insulin aspart (NOVOLOG) 100 UNIT/ML injection Inject 10 Units into the skin 3 (three) times daily with meals. 10 mL 11 12/20/2017  . insulin glargine (LANTUS) 100 UNIT/ML injection Inject 0.25 mLs (25 Units total) into the skin daily. 10 mL 11 12/20/2017  . lamoTRIgine (LAMICTAL) 25 MG tablet Take 1 tablet (25 mg total) by mouth 2 (two) times daily.   12/20/2017  . metFORMIN (GLUCOPHAGE) 1000 MG tablet Take 1 tablet (1,000 mg total) by mouth 2 (two) times daily with a meal. 180 tablet 1 12/20/2017  . pantoprazole (PROTONIX) 40 MG tablet Take 1 tablet (40 mg total) by mouth 2 (two) times daily.   12/20/2017  . polyethylene glycol (MIRALAX / GLYCOLAX) packet Take 17 g by mouth daily. 14 each 0 12/20/2017  . QUEtiapine (SEROQUEL) 50 MG tablet Take 1 tablet (50 mg total) by mouth at bedtime. For sleep  and mood   12/20/2017  . traZODone (DESYREL) 50 MG tablet Take 1 tablet (50 mg total) by mouth at bedtime and may repeat dose one time if needed.   12/20/2017    Patient Stressors: Health problems Medication change or noncompliance  Patient Strengths: Ability for insight Active sense of humor Average or above average intelligence Capable of independent living Communication skills Supportive family/friends  Treatment Modalities: Medication Management, Group therapy, Case management,  1 to 1 session with clinician, Psychoeducation, Recreational therapy.   Physician Treatment Plan for Primary Diagnosis: Severe recurrent major depression without psychotic features ([place]) Long Term Goal(s): Improvement in symptoms so as ready for discharge Improvement in symptoms so as ready for discharge   Short Term Goals: Ability to verbalize feelings will improve Ability to disclose and discuss suicidal ideas Ability to identify and develop effective coping behaviors will improve Ability to maintain clinical measurements within normal limits will improve  Medication Management: Evaluate patient's response, side effects, and tolerance of medication regimen.  Therapeutic Interventions: 1 to 1 sessions, Unit Group sessions and Medication administration.  Evaluation of Outcomes: Progressing  Physician Treatment Plan for Secondary Diagnosis: Principal Problem:   Severe recurrent major depression without psychotic features ([place]) Active Problems:   Type 2 diabetes mellitus without complication, without long-term current use of insulin (HCC)  Long Term Goal(s): Improvement in symptoms so as ready for discharge Improvement in symptoms so as ready for discharge   Short Term Goals: Ability to verbalize feelings will improve Ability to disclose and discuss suicidal ideas Ability to identify and develop effective coping behaviors will improve Ability to maintain clinical measurements within normal limits  will improve     Medication Management: Evaluate patient's response, side effects, and tolerance of medication regimen.  Therapeutic Interventions: 1 to 1 sessions, Unit Group sessions and Medication administration.  Evaluation of Outcomes: Progressing   RN Treatment Plan for Primary Diagnosis: Severe recurrent major depression without psychotic features ([place]) Long Term Goal(s): Knowledge of disease and therapeutic regimen to maintain health will improve  Short Term Goals: Ability to verbalize feelings will improve, Ability to identify and develop effective coping behaviors will improve and Compliance with prescribed medications will improve  Medication Management: RN will administer medications as ordered by provider, will assess and evaluate patient's response and provide education to patient for prescribed medication. RN will report any adverse and/or side effects to prescribing provider.  Therapeutic Interventions: 1 on 1 counseling sessions, Psychoeducation, Medication administration, Evaluate responses to treatment, Monitor  vital signs and CBGs as ordered, Perform/monitor CIWA, COWS, AIMS and Fall Risk screenings as ordered, Perform wound care treatments as ordered.  Evaluation of Outcomes: Progressing   LCSW Treatment Plan for Primary Diagnosis: Severe recurrent major depression without psychotic features ([place]) Long Term Goal(s): Safe transition to appropriate next level of care at discharge, Engage patient in therapeutic group addressing interpersonal concerns.  Short Term Goals: Engage patient in aftercare planning with referrals and resources, Increase emotional regulation and Increase skills for wellness and recovery  Therapeutic Interventions: Assess for all discharge needs, 1 to 1 time with Social worker, Explore available resources and support systems, Assess for adequacy in community support network, Educate family and significant other(s) on suicide prevention, Complete  Psychosocial Assessment, Interpersonal group therapy.  Evaluation of Outcomes: Progressing   Progress in Treatment: Attending groups: Yes. Participating in groups: Yes. Taking medication as prescribed: Yes. Toleration medication: Yes. Family/Significant other contact made: No, will contact:  a support if pt provides consent. Patient understands diagnosis: Yes. Discussing patient identified problems/goals with staff: Yes. Medical problems stabilized or resolved: Yes. Denies suicidal/homicidal ideation: Yes. Issues/concerns per patient self-inventory: No. Other: None at this time.   New problem(s) identified: No, Describe:  none at this time  New Short Term/Long Term Goal(s): Pt will report 0 SI by the time of discharge.   Patient Goals:  Pt reported his goal for treatment is to, "get rid of these images in my head and to work on my anger management."   Discharge Plan or Barriers: Pt will continue ECT while on BMU. Pt will be discharged home and will continue tx in the outpatient setting upon discharge.   Reason for Continuation of Hospitalization: Depression Hallucinations Medication stabilization   Estimated Length of Stay: 6 days  Recreational Therapy: Patient Stressors: N/A  Patient Goal: Patient will identify 3 positive coping skills to decrease depressive symptoms within 5 recreation therapy group sessions    Attendees: Patient: Marc Long 12/22/2017 3:32 PM  Physician: Dr. Weber Cooks, MD 12/22/2017 3:32 PM  Nursing:  12/22/2017 3:32 PM  RN Care Manager: 12/22/2017 3:32 PM  Social Worker: Alden Hipp, LCSW 12/22/2017 3:32 PM  Recreational Therapist:  12/22/2017 3:32 PM  Other:  12/22/2017 3:32 PM  Other:  12/22/2017 3:32 PM  Other: 12/22/2017 3:32 PM    Scribe for Treatment Team: Alden Hipp, LCSW 12/22/2017 3:32 PM

## 2017-12-22 NOTE — Progress Notes (Signed)
[facility] MD Progress Note  12/22/2017 6:09 PM Marc Long  MRN:  [id number] Subjective: Follow-up note for this 42 year old man receiving ECT.  Patient tells me physically he is feeling better today.  Still a little muscle stiffness but not as bad as previously.  Emotionally today was a bad day.  He had several of the episodes that he calls "visions" which are intrusive thoughts about killing himself or other people.  He cannot put his finger on why these things occur.  He denies any intention to act on them. Principal Problem: Severe recurrent major depression without psychotic features ([place]) Diagnosis:   Patient Active Problem List   Diagnosis Date Noted  . Severe recurrent major depression without psychotic features ([place]) [F33.2] 12/16/2017  . MDD (major depressive disorder), recurrent episode, severe ([place]) [F33.2] 12/10/2017  . Obesity (BMI 30-39.9) [E66.9] 12/09/2017  . Depression with suicidal ideation [F32.9, R45.851]   . DKA (diabetic ketoacidoses) ([place]) [E13.10] 12/05/2017  . Hypertriglyceridemia [E78.1] 02/05/2017  . DM (diabetes mellitus), type 2, uncontrolled ([place]) [E11.65] 12/12/2016  . Dehydration [E86.0] 12/12/2016  . Suicidal ideations [R45.851] 12/12/2016  . Bipolar disorder (manic depression) ([place]) [F31.9] 12/12/2016  . Hyperglycemia due to type 2 diabetes mellitus ([place]) [E11.65] 12/12/2016  . AKI (acute kidney injury) ([place]) [N17.9] 12/12/2016  . DKA, type 2, not at goal [facility]) [E11.10] 12/12/2016  . Obesity, Class III, BMI 40-49.9 (morbid obesity) ([place]) [E66.01]   . Type 2 diabetes mellitus without complication, without long-term current use of insulin ([place]) [E11.9] 12/10/2016  . Lithium use [Z79.899] 12/10/2016  . Moderate single current episode of major depressive disorder ([place]) [F32.1] 04/15/2016  . Pain in joint, shoulder region [M25.519] 04/14/2016  . Diverticulosis of large intestine without hemorrhage [K57.30] 11/07/2015  . Calculus of gallbladder without  cholecystitis [K80.20] 11/07/2015   Total Time spent with patient: 30 minutes  Past Psychiatric History: Patient has a history of recurrent depression and suicidality with poor response to medicine  Past Medical History:  Past Medical History:  Diagnosis Date  . Depression   . Diabetes mellitus without complication ([place])   . Gallstones   . Obesity, Class III, BMI 40-49.9 (morbid obesity) ([place])    History reviewed. No pertinent surgical history. Family History:  Family History  Problem Relation Age of Onset  . Hypertension Mother   . Depression Mother   . Heart attack Maternal Grandfather   . Lymphoma Maternal Grandmother   . Diabetes Maternal Aunt   . Cancer Maternal Aunt    Family Psychiatric  History: Positive Social History:  Social History   Substance and Sexual Activity  Alcohol Use Yes  . Alcohol/week: 0.0 oz   Comment: once every 2 weeks. Wine coolers     Social History   Substance and Sexual Activity  Drug Use No    Social History   Socioeconomic History  . Marital status: Single    Spouse name: Not on file  . Number of children: 0  . Years of education: Not on file  . Highest education level: Not on file  Occupational History  . Occupation: call center  Social Needs  . Financial resource strain: Not on file  . Food insecurity:    Worry: Not on file    Inability: Not on file  . Transportation needs:    Medical: Not on file    Non-medical: Not on file  Tobacco Use  . Smoking status: Never Smoker  . Smokeless tobacco: Never Used  Substance and Sexual Activity  .  Alcohol use: Yes    Alcohol/week: 0.0 oz    Comment: once every 2 weeks. Wine coolers  . Drug use: No  . Sexual activity: Not Currently    Birth control/protection: None  Lifestyle  . Physical activity:    Days per week: Not on file    Minutes per session: Not on file  . Stress: Not on file  Relationships  . Social connections:    Talks on phone: Not on file    Gets together: Not  on file    Attends religious service: Not on file    Active member of club or organization: Not on file    Attends meetings of clubs or organizations: Not on file    Relationship status: Not on file  Other Topics Concern  . Not on file  Social History Narrative  . Not on file   Additional Social History:                         Sleep: Fair  Appetite:  Fair  Current Medications: Current Facility-Administered Medications  Medication Dose Route Frequency Provider Last Rate Last Dose  . acetaminophen (TYLENOL) tablet 650 mg  650 mg Oral Q6H PRN Earlee Herald, Madie Reno, MD   650 mg at 12/18/17 1347  . alum & mag hydroxide-simeth (MAALOX/MYLANTA) 200-200-20 MG/5ML suspension 30 mL  30 mL Oral Q4H PRN Telly Broberg, Shaye T, MD      . busPIRone (BUSPAR) tablet 10 mg  10 mg Oral TID Gift Rueckert, Madie Reno, MD   10 mg at 12/22/17 1628  . feeding supplement (GLUCERNA SHAKE) (GLUCERNA SHAKE) liquid 237 mL  237 mL Oral TID BM Mattthew Ziomek, Dwane T, MD   237 mL at 12/22/17 1633  . FLUoxetine (PROZAC) capsule 40 mg  40 mg Oral Daily Raymonde Hamblin, Madie Reno, MD   40 mg at 12/22/17 0809  . hydrocortisone cream 1 %   Topical BID PRN Cara Thaxton, Shady T, MD      . hydrOXYzine (ATARAX/VISTARIL) tablet 50 mg  50 mg Oral TID PRN Vonita Calloway, Madie Reno, MD   50 mg at 12/22/17 1143  . ibuprofen (ADVIL,MOTRIN) tablet 800 mg  800 mg Oral Q6H PRN Lenward Chancellor, MD   800 mg at 12/20/17 1616  . insulin aspart (novoLOG) injection 0-9 Units  0-9 Units Subcutaneous TID WC Lamario Mani, Madie Reno, MD   1 Units at 12/22/17 1144  . magnesium hydroxide (MILK OF MAGNESIA) suspension 30 mL  30 mL Oral Daily PRN Lovenia Debruler, Jovian T, MD      . metFORMIN (GLUCOPHAGE) tablet 1,000 mg  1,000 mg Oral BID WC Kilian Schwartz, Sayed T, MD   1,000 mg at 12/22/17 1630  . midazolam (VERSED) injection 2 mg  2 mg Intravenous Once Jody Aguinaga, Daison T, MD      . midazolam (VERSED) injection 2 mg  2 mg Intravenous Once Brandt Chaney, Osamah T, MD      . multivitamin with minerals tablet 1 tablet  1  tablet Oral Daily Juris Gosnell, Madie Reno, MD   1 tablet at 12/22/17 0809  . ondansetron (ZOFRAN) injection 4 mg  4 mg Intravenous Once PRN Gunnar Fusi, MD      . pantoprazole (PROTONIX) EC tablet 40 mg  40 mg Oral Daily Dezarai Prew, Madie Reno, MD   40 mg at 12/22/17 0809  . polyethylene glycol (MIRALAX / GLYCOLAX) packet 17 g  17 g Oral Daily Alexus Galka, Madie Reno, MD   17 g at 12/19/17 7408  .  QUEtiapine (SEROQUEL) tablet 100 mg  100 mg Oral QHS Junior Huezo, Sadarius T, MD      . traZODone (DESYREL) tablet 100 mg  100 mg Oral QHS PRN Garyson Stelly, Madie Reno, MD   100 mg at 12/18/17 2058  . traZODone (DESYREL) tablet 50 mg  50 mg Oral QHS Maxyne Derocher, Madie Reno, MD   50 mg at 12/21/17 2121    Lab Results:  Results for orders placed or performed during the hospital encounter of 12/16/17 (from the past 48 hour(s))  Glucose, capillary     Status: Abnormal   Collection Time: 12/20/17  8:15 PM  Result Value Ref Range   Glucose-Capillary 55 (L) 70 - 99 mg/dL  Glucose, capillary     Status: Abnormal   Collection Time: 12/20/17  8:41 PM  Result Value Ref Range   Glucose-Capillary 52 (L) 70 - 99 mg/dL  Glucose, capillary     Status: Abnormal   Collection Time: 12/20/17 10:06 PM  Result Value Ref Range   Glucose-Capillary 107 (H) 70 - 99 mg/dL  Glucose, capillary     Status: None   Collection Time: 12/21/17  7:00 AM  Result Value Ref Range   Glucose-Capillary 96 70 - 99 mg/dL   Comment 1 Notify RN   Glucose, capillary     Status: Abnormal   Collection Time: 12/21/17 12:49 PM  Result Value Ref Range   Glucose-Capillary 109 (H) 70 - 99 mg/dL   Comment 1 Notify RN   Glucose, capillary     Status: Abnormal   Collection Time: 12/21/17  4:17 PM  Result Value Ref Range   Glucose-Capillary 100 (H) 70 - 99 mg/dL   Comment 1 Document in Chart   Glucose, capillary     Status: None   Collection Time: 12/21/17  8:23 PM  Result Value Ref Range   Glucose-Capillary 70 70 - 99 mg/dL   Comment 1 Notify RN   Glucose, capillary      Status: None   Collection Time: 12/21/17  9:26 PM  Result Value Ref Range   Glucose-Capillary 77 70 - 99 mg/dL   Comment 1 Notify RN   Glucose, capillary     Status: Abnormal   Collection Time: 12/21/17 10:43 PM  Result Value Ref Range   Glucose-Capillary 121 (H) 70 - 99 mg/dL   Comment 1 Notify RN   Glucose, capillary     Status: Abnormal   Collection Time: 12/22/17  6:55 AM  Result Value Ref Range   Glucose-Capillary 105 (H) 70 - 99 mg/dL   Comment 1 Notify RN   Glucose, capillary     Status: Abnormal   Collection Time: 12/22/17 11:18 AM  Result Value Ref Range   Glucose-Capillary 142 (H) 70 - 99 mg/dL   Comment 1 Notify RN   Glucose, capillary     Status: Abnormal   Collection Time: 12/22/17  4:14 PM  Result Value Ref Range   Glucose-Capillary 101 (H) 70 - 99 mg/dL   Comment 1 Notify RN     Blood Alcohol level:  Lab Results  Component Value Date   ETH <10 28/00/3491    Metabolic Disorder Labs: Lab Results  Component Value Date   HGBA1C 16.7 (H) 12/09/2017   MPG 432.59 12/09/2017   No results found for: PROLACTIN Lab Results  Component Value Date   CHOL 142 03/05/2017   TRIG 148 03/05/2017   HDL 27 (L) 03/05/2017   CHOLHDL 5.3 (H) 03/05/2017   LDLCALC 85 03/05/2017  [place] Comment 12/10/2016    Physical Findings: AIMS:  , ,  ,  ,    CIWA:    COWS:     Musculoskeletal: Strength & Muscle Tone: within normal limits Gait & Station: normal Patient leans: N/A  Psychiatric Specialty Exam: Physical Exam  Nursing note and vitals reviewed. Constitutional: He appears well-developed and well-nourished.  HENT:  Head: Normocephalic and atraumatic.  Eyes: Pupils are equal, round, and reactive to light. Conjunctivae are normal.  Neck: Normal range of motion.  Cardiovascular: Regular rhythm and normal heart sounds.  Respiratory: Effort normal. No respiratory distress.  GI: Soft.  Musculoskeletal: Normal range of motion.  Neurological: He is alert.  Skin:  Skin is warm and dry.  Psychiatric: Judgment normal. His affect is blunt. His speech is delayed. He is slowed. Cognition and memory are normal. He expresses suicidal ideation. He expresses no suicidal plans.    Review of Systems  Constitutional: Negative.   HENT: Negative.   Eyes: Negative.   Respiratory: Negative.   Cardiovascular: Negative.   Gastrointestinal: Negative.   Musculoskeletal: Positive for myalgias.  Skin: Negative.   Neurological: Negative.   Psychiatric/Behavioral: Positive for depression, hallucinations and suicidal ideas. Negative for memory loss and substance abuse. The patient is not nervous/anxious and does not have insomnia.     Blood pressure 124/78, pulse (!) 106, temperature 97.8 F (36.6 C), temperature source Oral, resp. rate 18, height 5\' 11"  (1.803 m), weight 124.3 kg (274 lb), SpO2 97 %.Body mass index is 38.22 kg/m.  General Appearance: Fairly Groomed  Eye Contact:  Good  Speech:  Clear and Coherent  Volume:  Normal  Mood:  Dysphoric  Affect:  Congruent  Thought Process:  Goal Directed  Orientation:  Full (Time, Place, and Person)  Thought Content:  Logical  Suicidal Thoughts:  Yes.  without intent/plan  Homicidal Thoughts:  Yes.  without intent/plan  Memory:  Immediate;   Fair Recent;   Poor Remote;   Fair  Judgement:  Fair  Insight:  Fair  Psychomotor Activity:  Normal  Concentration:  Concentration: Fair  Recall:  [employer] of Knowledge:  Fair  Language:  Fair  Akathisia:  No  Handed:  Right  AIMS (if indicated):     Assets:  Desire for Improvement Physical Health Social Support  ADL's:  Intact  Cognition:  WNL  Sleep:  Number of Hours: 6.3     Treatment Plan Summary: Daily contact with patient to assess and evaluate symptoms and progress in treatment, Medication management and Plan Patient continues to have anxious depressed and intrusive thoughts.  Behavior is calm.  Patient will be increased on his Seroquel to 100 mg at night  for the intrusive thoughts and mood.  Continue ECT with next treatment scheduled for tomorrow morning.  His diabetes seems to be under a safer level of control on no standing insulin at all so I am going to continue that for now.  No other need for new physical intervention.  Still estimated length of stay in the neighborhood of 6 days.  Alethia Berthold, MD 12/22/2017, 6:09 PM

## 2017-12-23 ENCOUNTER — Inpatient Hospital Stay: Payer: Medicaid Other | Admitting: Anesthesiology

## 2017-12-23 ENCOUNTER — Inpatient Hospital Stay: Payer: Medicaid Other

## 2017-12-23 LAB — GLUCOSE, CAPILLARY
GLUCOSE-CAPILLARY: 117 mg/dL — AB (ref 70–99)
Glucose-Capillary: 105 mg/dL — ABNORMAL HIGH (ref 70–99)
Glucose-Capillary: 121 mg/dL — ABNORMAL HIGH (ref 70–99)
Glucose-Capillary: 126 mg/dL — ABNORMAL HIGH (ref 70–99)

## 2017-12-23 MED ORDER — KETAMINE HCL 10 MG/ML IJ SOLN
INTRAMUSCULAR | Status: DC | PRN
  Administered 2017-12-23: 120 mg via INTRAVENOUS
  Administered 2017-12-23: 50 mg via INTRAVENOUS

## 2017-12-23 MED ORDER — SODIUM CHLORIDE 0.9 % IV SOLN
INTRAVENOUS | Status: DC | PRN
  Administered 2017-12-23: 10:00:00 via INTRAVENOUS

## 2017-12-23 MED ORDER — MIDAZOLAM HCL 2 MG/2ML IJ SOLN
INTRAMUSCULAR | Status: AC
  Filled 2017-12-23: qty 2

## 2017-12-23 MED ORDER — ONDANSETRON HCL 4 MG/2ML IJ SOLN: 4.0000 mg | Freq: Once | INTRAMUSCULAR | Status: DC | PRN

## 2017-12-23 MED ORDER — KETOROLAC TROMETHAMINE 30 MG/ML IJ SOLN
INTRAMUSCULAR | Status: AC
  Filled 2017-12-23: qty 1

## 2017-12-23 MED ORDER — GLYCOPYRROLATE 0.2 MG/ML IJ SOLN
INTRAMUSCULAR | Status: AC
  Filled 2017-12-23: qty 1

## 2017-12-23 MED ORDER — GLYCOPYRROLATE 0.2 MG/ML IJ SOLN
0.2000 mg | Freq: Once | INTRAMUSCULAR | Status: AC
  Administered 2017-12-23: 0.2 mg via INTRAVENOUS

## 2017-12-23 MED ORDER — MIDAZOLAM HCL 2 MG/2ML IJ SOLN
INTRAMUSCULAR | Status: DC | PRN
  Administered 2017-12-23: 2 mg via INTRAVENOUS

## 2017-12-23 MED ORDER — KETOROLAC TROMETHAMINE 30 MG/ML IJ SOLN
30.0000 mg | Freq: Once | INTRAMUSCULAR | Status: AC
  Administered 2017-12-23: 30 mg via INTRAVENOUS

## 2017-12-23 MED ORDER — SUCCINYLCHOLINE CHLORIDE 20 MG/ML IJ SOLN
INTRAMUSCULAR | Status: AC
  Filled 2017-12-23: qty 1

## 2017-12-23 MED ORDER — SODIUM CHLORIDE 0.9 % IV SOLN
500.0000 mL | Freq: Once | INTRAVENOUS | Status: AC
  Administered 2017-12-23: 500 mL via INTRAVENOUS

## 2017-12-23 MED ORDER — SUCCINYLCHOLINE CHLORIDE 200 MG/10ML IV SOSY
PREFILLED_SYRINGE | INTRAVENOUS | Status: DC | PRN
  Administered 2017-12-23: 140 mg via INTRAVENOUS

## 2017-12-23 MED ORDER — MIDAZOLAM HCL 2 MG/2ML IJ SOLN: 2.0000 mg | Freq: Once | INTRAMUSCULAR | Status: DC

## 2017-12-23 MED ORDER — FENTANYL CITRATE (PF) 100 MCG/2ML IJ SOLN: 25.0000 ug | INTRAMUSCULAR | Status: DC | PRN

## 2017-12-23 NOTE — Progress Notes (Signed)
Recreation Therapy Notes  Date: 12/23/2017  Time: 9:30 am   Location: Craft Room   Behavioral response: N/A   Intervention Topic:  Coping  Discussion/Intervention: Patient did not attend group.   Clinical Observations/Feedback:  Patient did not attend group.   Rachelanne Whidby LRT/CTRS         Anastacia Reinecke 12/23/2017 11:49 AM

## 2017-12-23 NOTE — H&P (Signed)
Marc Long is an 42 y.o. male.   Chief Complaint: Patient with improved mood although still having intrusive ideation HPI: History of recurrent mood instability  Past Medical History:  Diagnosis Date  . Depression   . Diabetes mellitus without complication ([place])   . Gallstones   . Obesity, Class III, BMI 40-49.9 (morbid obesity) ([place])     History reviewed. No pertinent surgical history.  Family History  Problem Relation Age of Onset  . Hypertension Mother   . Depression Mother   . Heart attack Maternal Grandfather   . Lymphoma Maternal Grandmother   . Diabetes Maternal Aunt   . Cancer Maternal Aunt    Social History:  reports that he has never smoked. He has never used smokeless tobacco. He reports that he drinks alcohol. He reports that he does not use drugs.  Allergies:  Allergies  Allergen Reactions  . Bee Venom Swelling  . Keflex [Cephalexin]     UPSET STOMACH  . Relafen [Nabumetone]     Blood in stool  . Shellfish Allergy Nausea Only    Medications Prior to Admission  Medication Sig Dispense Refill  . busPIRone (BUSPAR) 10 MG tablet Take 10 mg by mouth 3 (three) times daily.  1  . FLUoxetine (PROZAC) 40 MG capsule Take 1 capsule (40 mg total) by mouth daily.  3  . hydrocortisone cream 1 % Apply topically 2 (two) times daily as needed for itching. 30 g 0  . insulin aspart (NOVOLOG) 100 UNIT/ML injection Inject 10 Units into the skin 3 (three) times daily with meals. 10 mL 11  . insulin glargine (LANTUS) 100 UNIT/ML injection Inject 0.25 mLs (25 Units total) into the skin daily. 10 mL 11  . lamoTRIgine (LAMICTAL) 25 MG tablet Take 1 tablet (25 mg total) by mouth 2 (two) times daily.    . metFORMIN (GLUCOPHAGE) 1000 MG tablet Take 1 tablet (1,000 mg total) by mouth 2 (two) times daily with a meal. 180 tablet 1  . pantoprazole (PROTONIX) 40 MG tablet Take 1 tablet (40 mg total) by mouth 2 (two) times daily.    . polyethylene glycol (MIRALAX / GLYCOLAX) packet  Take 17 g by mouth daily. 14 each 0  . QUEtiapine (SEROQUEL) 50 MG tablet Take 1 tablet (50 mg total) by mouth at bedtime. For sleep and mood    . traZODone (DESYREL) 50 MG tablet Take 1 tablet (50 mg total) by mouth at bedtime and may repeat dose one time if needed.      Results for orders placed or performed during the hospital encounter of 12/16/17 (from the past 48 hour(s))  Glucose, capillary     Status: Abnormal   Collection Time: 12/21/17 12:49 PM  Result Value Ref Range   Glucose-Capillary 109 (H) 70 - 99 mg/dL   Comment 1 Notify RN   Glucose, capillary     Status: Abnormal   Collection Time: 12/21/17  4:17 PM  Result Value Ref Range   Glucose-Capillary 100 (H) 70 - 99 mg/dL   Comment 1 Document in Chart   Glucose, capillary     Status: None   Collection Time: 12/21/17  8:23 PM  Result Value Ref Range   Glucose-Capillary 70 70 - 99 mg/dL   Comment 1 Notify RN   Glucose, capillary     Status: None   Collection Time: 12/21/17  9:26 PM  Result Value Ref Range   Glucose-Capillary 77 70 - 99 mg/dL   Comment 1 Notify RN  Glucose, capillary     Status: Abnormal   Collection Time: 12/21/17 10:43 PM  Result Value Ref Range   Glucose-Capillary 121 (H) 70 - 99 mg/dL   Comment 1 Notify RN   Glucose, capillary     Status: Abnormal   Collection Time: 12/22/17  6:55 AM  Result Value Ref Range   Glucose-Capillary 105 (H) 70 - 99 mg/dL   Comment 1 Notify RN   Glucose, capillary     Status: Abnormal   Collection Time: 12/22/17 11:18 AM  Result Value Ref Range   Glucose-Capillary 142 (H) 70 - 99 mg/dL   Comment 1 Notify RN   Glucose, capillary     Status: Abnormal   Collection Time: 12/22/17  4:14 PM  Result Value Ref Range   Glucose-Capillary 101 (H) 70 - 99 mg/dL   Comment 1 Notify RN   Glucose, capillary     Status: None   Collection Time: 12/22/17  8:54 PM  Result Value Ref Range   Glucose-Capillary 95 70 - 99 mg/dL  Glucose, capillary     Status: Abnormal   Collection  Time: 12/23/17  6:28 AM  Result Value Ref Range   Glucose-Capillary 117 (H) 70 - 99 mg/dL   No results found.  Review of Systems  Constitutional: Negative.   HENT: Negative.   Eyes: Negative.   Respiratory: Negative.   Cardiovascular: Negative.   Gastrointestinal: Negative.   Musculoskeletal: Negative.   Skin: Negative.   Neurological: Negative.   Psychiatric/Behavioral: Positive for depression. Negative for hallucinations, memory loss, substance abuse and suicidal ideas. The patient is not nervous/anxious and does not have insomnia.     Blood pressure 126/88, pulse (!) 59, temperature 97.9 F (36.6 C), temperature source Oral, resp. rate 16, height 5\' 11"  (1.803 m), weight 124.3 kg (274 lb), SpO2 98 %. Physical Exam  Nursing note and vitals reviewed. Constitutional: He appears well-developed and well-nourished.  HENT:  Head: Normocephalic and atraumatic.  Eyes: Pupils are equal, round, and reactive to light. Conjunctivae are normal.  Neck: Normal range of motion.  Cardiovascular: Regular rhythm and normal heart sounds.  Respiratory: Effort normal. No respiratory distress.  GI: Soft.  Musculoskeletal: Normal range of motion.  Neurological: He is alert.  Skin: Skin is warm and dry.  Psychiatric: He has a normal mood and affect. Judgment and thought content normal.     Assessment/Plan Tolerating ECT well continue to increase through this week with ongoing reassessment about the need for inpatient treatment  Alethia Berthold, MD 12/23/2017, 10:12 AM

## 2017-12-23 NOTE — Anesthesia Procedure Notes (Signed)
Date/Time: 12/23/2017 10:50 AM Performed by: Dionne Bucy, CRNA Pre-anesthesia Checklist: Patient identified, Emergency Drugs available, Suction available and Patient being monitored Patient Re-evaluated:Patient Re-evaluated prior to induction Oxygen Delivery Method: Circle system utilized Preoxygenation: Pre-oxygenation with 100% oxygen Induction Type: IV induction Ventilation: Mask ventilation without difficulty and Mask ventilation throughout procedure Airway Equipment and Method: Bite block Placement Confirmation: positive ETCO2 Dental Injury: Teeth and Oropharynx as per pre-operative assessment

## 2017-12-23 NOTE — Plan of Care (Signed)
Mental  and emotional status improved  Continue to work with resources available ECT  for ongoing  improvement  in mental status.  No  safety concerns . Continue to work on Scientific laboratory technician  and decision making  . Attending  unit programing  Compliant  with medication  and understanding of  what they are used for .  Voice no concerns around  sleep thoughts of suicide but no plan .Patient  knowledgeable of information received verbalize understanding .   Problem: Education: Goal: Knowledge of [place] [facility] Education information/materials will improve Outcome: Progressing Goal: Emotional status will improve Outcome: Progressing Goal: Mental status will improve Outcome: Progressing Goal: Verbalization of understanding the information provided will improve Outcome: Progressing   Problem: Education: Goal: Knowledge of [place] [facility] Education information/materials will improve Outcome: Progressing Goal: Emotional status will improve Outcome: Progressing Goal: Mental status will improve Outcome: Progressing Goal: Verbalization of understanding the information provided will improve Outcome: Progressing   Problem: Activity: Goal: Interest or engagement in activities will improve Outcome: Progressing Goal: Sleeping patterns will improve Outcome: Progressing   Problem: Coping: Goal: Ability to verbalize frustrations and anger appropriately will improve Outcome: Progressing Goal: Ability to demonstrate self-control will improve Outcome: Progressing   Problem: Health Behavior/Discharge Planning: Goal: Identification of resources available to assist in meeting health care needs will improve Outcome: Progressing Goal: Compliance with treatment plan for underlying cause of condition will improve Outcome: Progressing

## 2017-12-23 NOTE — Procedures (Signed)
ECT SERVICES Physician's Interval Evaluation & Treatment Note  Patient Identification: Marc Long MRN:  [id number] Date of Evaluation:  12/23/2017 TX #: 3  MADRS:   MMSE:   P.E. Findings:  No change to physical exam  Psychiatric Interval Note:  Intrusive thoughts less disturbing mood stable  Subjective:  Patient is a 42 y.o. male seen for evaluation for Electroconvulsive Therapy. See note above.  Slight improvement.  Not feeling as unsafe as previously  Treatment Summary:   [x]   Right Unilateral             []  Bilateral   % Energy : 0.3 ms 100%   Impedance: 510 ohms  Seizure Energy Index: 11,802 V squared  Postictal Suppression Index: No reading  Seizure Concordance Index: 98%  Medications  Pre Shock: Ketamine a total of 170 mg.  He was given 121st but then 50 more were added to ensure complete sedation, succinylcholine 140 mg  Post Shock: Versed 2 mg  Seizure Duration: 26 seconds EMG 44 seconds EEG follow-up Friday   Comments: Follow-up Friday  Lungs:  [x]   Clear to auscultation               []  Other:   Heart:    [x]   Regular rhythm             []  irregular rhythm    [x]   Previous H&P reviewed, patient examined and there are NO CHANGES                 []   Previous H&P reviewed, patient examined and there are changes noted.   Alethia Berthold, MD 7/10/201910:39 AM

## 2017-12-23 NOTE — Progress Notes (Signed)
[facility] MD Progress Note  12/23/2017 7:03 PM Marc Long  MRN:  [id number] Subjective: Follow-up 42 year old man with depression.  Patient had ECT this morning tolerated without difficulty.  Mood continues to show gradual improvement.  Still has intrusive suicidal thoughts but does not feel like he is at risk of acting on them.  He does complain of swelling in his ankles which I am at a loss to explain. Principal Problem: Severe recurrent major depression without psychotic features ([place]) Diagnosis:   Patient Active Problem List   Diagnosis Date Noted  . Severe recurrent major depression without psychotic features ([place]) [F33.2] 12/16/2017  . MDD (major depressive disorder), recurrent episode, severe ([place]) [F33.2] 12/10/2017  . Obesity (BMI 30-39.9) [E66.9] 12/09/2017  . Depression with suicidal ideation [F32.9, R45.851]   . DKA (diabetic ketoacidoses) ([place]) [E13.10] 12/05/2017  . Hypertriglyceridemia [E78.1] 02/05/2017  . DM (diabetes mellitus), type 2, uncontrolled ([place]) [E11.65] 12/12/2016  . Dehydration [E86.0] 12/12/2016  . Suicidal ideations [R45.851] 12/12/2016  . Bipolar disorder (manic depression) ([place]) [F31.9] 12/12/2016  . Hyperglycemia due to type 2 diabetes mellitus ([place]) [E11.65] 12/12/2016  . AKI (acute kidney injury) ([place]) [N17.9] 12/12/2016  . DKA, type 2, not at goal [facility]) [E11.10] 12/12/2016  . Obesity, Class III, BMI 40-49.9 (morbid obesity) ([place]) [E66.01]   . Type 2 diabetes mellitus without complication, without long-term current use of insulin ([place]) [E11.9] 12/10/2016  . Lithium use [Z79.899] 12/10/2016  . Moderate single current episode of major depressive disorder ([place]) [F32.1] 04/15/2016  . Pain in joint, shoulder region [M25.519] 04/14/2016  . Diverticulosis of large intestine without hemorrhage [K57.30] 11/07/2015  . Calculus of gallbladder without cholecystitis [K80.20] 11/07/2015   Total Time spent with patient: 30 minutes  Past Psychiatric History: See  previous notes.  History of recurrent depression  Past Medical History:  Past Medical History:  Diagnosis Date  . Depression   . Diabetes mellitus without complication ([place])   . Gallstones   . Obesity, Class III, BMI 40-49.9 (morbid obesity) ([place])    History reviewed. No pertinent surgical history. Family History:  Family History  Problem Relation Age of Onset  . Hypertension Mother   . Depression Mother   . Heart attack Maternal Grandfather   . Lymphoma Maternal Grandmother   . Diabetes Maternal Aunt   . Cancer Maternal Aunt    Family Psychiatric  History: See previous notes Social History:  Social History   Substance and Sexual Activity  Alcohol Use Yes  . Alcohol/week: 0.0 oz   Comment: once every 2 weeks. Wine coolers     Social History   Substance and Sexual Activity  Drug Use No    Social History   Socioeconomic History  . Marital status: Single    Spouse name: Not on file  . Number of children: 0  . Years of education: Not on file  . Highest education level: Not on file  Occupational History  . Occupation: call center  Social Needs  . Financial resource strain: Not on file  . Food insecurity:    Worry: Not on file    Inability: Not on file  . Transportation needs:    Medical: Not on file    Non-medical: Not on file  Tobacco Use  . Smoking status: Never Smoker  . Smokeless tobacco: Never Used  Substance and Sexual Activity  . Alcohol use: Yes    Alcohol/week: 0.0 oz    Comment: once every 2 weeks. Wine coolers  . Drug use: No  .  Sexual activity: Not Currently    Birth control/protection: None  Lifestyle  . Physical activity:    Days per week: Not on file    Minutes per session: Not on file  . Stress: Not on file  Relationships  . Social connections:    Talks on phone: Not on file    Gets together: Not on file    Attends religious service: Not on file    Active member of club or organization: Not on file    Attends meetings of clubs or  organizations: Not on file    Relationship status: Not on file  Other Topics Concern  . Not on file  Social History Narrative  . Not on file   Additional Social History:                         Sleep: Fair  Appetite:  Fair  Current Medications: Current Facility-Administered Medications  Medication Dose Route Frequency Provider Last Rate Last Dose  . acetaminophen (TYLENOL) tablet 650 mg  650 mg Oral Q6H PRN Clapacs, Madie Reno, MD   650 mg at 12/18/17 1347  . alum & mag hydroxide-simeth (MAALOX/MYLANTA) 200-200-20 MG/5ML suspension 30 mL  30 mL Oral Q4H PRN Clapacs, Edmon T, MD      . busPIRone (BUSPAR) tablet 10 mg  10 mg Oral TID Clapacs, Madie Reno, MD   10 mg at 12/23/17 1637  . feeding supplement (GLUCERNA SHAKE) (GLUCERNA SHAKE) liquid 237 mL  237 mL Oral TID BM Clapacs, Albie T, MD   237 mL at 12/23/17 1444  . fentaNYL (SUBLIMAZE) injection 25 mcg  25 mcg Intravenous Q5 min PRN Alvin Critchley, MD      . FLUoxetine (PROZAC) capsule 40 mg  40 mg Oral Daily Clapacs, Madie Reno, MD   40 mg at 12/23/17 1222  . glycopyrrolate (ROBINUL) 0.2 MG/ML injection           . hydrocortisone cream 1 %   Topical BID PRN Clapacs, Nigel T, MD      . hydrOXYzine (ATARAX/VISTARIL) tablet 50 mg  50 mg Oral TID PRN Clapacs, Madie Reno, MD   50 mg at 12/22/17 2121  . ibuprofen (ADVIL,MOTRIN) tablet 800 mg  800 mg Oral Q6H PRN Lenward Chancellor, MD   800 mg at 12/20/17 1616  . insulin aspart (novoLOG) injection 0-9 Units  0-9 Units Subcutaneous TID WC Clapacs, Madie Reno, MD   1 Units at 12/23/17 1635  . ketorolac (TORADOL) 30 MG/ML injection           . magnesium hydroxide (MILK OF MAGNESIA) suspension 30 mL  30 mL Oral Daily PRN Clapacs, Liam T, MD      . metFORMIN (GLUCOPHAGE) tablet 1,000 mg  1,000 mg Oral BID WC Clapacs, Tal T, MD   1,000 mg at 12/23/17 1223  . midazolam (VERSED) injection 2 mg  2 mg Intravenous Once Clapacs, Zidan T, MD      . midazolam (VERSED) injection 2 mg  2 mg Intravenous Once Clapacs,  Sumedh T, MD      . midazolam (VERSED) injection 2 mg  2 mg Intravenous Once Clapacs, Auguste T, MD      . multivitamin with minerals tablet 1 tablet  1 tablet Oral Daily Clapacs, Madie Reno, MD   1 tablet at 12/23/17 1224  . ondansetron (ZOFRAN) injection 4 mg  4 mg Intravenous Once PRN Gunnar Fusi, MD      . ondansetron [facility])  injection 4 mg  4 mg Intravenous Once PRN Alvin Critchley, MD      . pantoprazole (PROTONIX) EC tablet 40 mg  40 mg Oral Daily Clapacs, Madie Reno, MD   40 mg at 12/23/17 1223  . polyethylene glycol (MIRALAX / GLYCOLAX) packet 17 g  17 g Oral Daily Clapacs, Madie Reno, MD   17 g at 12/19/17 3888  . QUEtiapine (SEROQUEL) tablet 100 mg  100 mg Oral QHS Clapacs, Vicent T, MD   100 mg at 12/22/17 2121  . traZODone (DESYREL) tablet 100 mg  100 mg Oral QHS PRN Clapacs, Madie Reno, MD   100 mg at 12/22/17 2121  . traZODone (DESYREL) tablet 50 mg  50 mg Oral QHS Clapacs, Madie Reno, MD   50 mg at 12/22/17 2121    Lab Results:  Results for orders placed or performed during the hospital encounter of 12/16/17 (from the past 48 hour(s))  Glucose, capillary     Status: None   Collection Time: 12/21/17  8:23 PM  Result Value Ref Range   Glucose-Capillary 70 70 - 99 mg/dL   Comment 1 Notify RN   Glucose, capillary     Status: None   Collection Time: 12/21/17  9:26 PM  Result Value Ref Range   Glucose-Capillary 77 70 - 99 mg/dL   Comment 1 Notify RN   Glucose, capillary     Status: Abnormal   Collection Time: 12/21/17 10:43 PM  Result Value Ref Range   Glucose-Capillary 121 (H) 70 - 99 mg/dL   Comment 1 Notify RN   Glucose, capillary     Status: Abnormal   Collection Time: 12/22/17  6:55 AM  Result Value Ref Range   Glucose-Capillary 105 (H) 70 - 99 mg/dL   Comment 1 Notify RN   Glucose, capillary     Status: Abnormal   Collection Time: 12/22/17 11:18 AM  Result Value Ref Range   Glucose-Capillary 142 (H) 70 - 99 mg/dL   Comment 1 Notify RN   Glucose, capillary     Status: Abnormal    Collection Time: 12/22/17  4:14 PM  Result Value Ref Range   Glucose-Capillary 101 (H) 70 - 99 mg/dL   Comment 1 Notify RN   Glucose, capillary     Status: None   Collection Time: 12/22/17  8:54 PM  Result Value Ref Range   Glucose-Capillary 95 70 - 99 mg/dL  Glucose, capillary     Status: Abnormal   Collection Time: 12/23/17  6:28 AM  Result Value Ref Range   Glucose-Capillary 117 (H) 70 - 99 mg/dL  Glucose, capillary     Status: Abnormal   Collection Time: 12/23/17 11:04 AM  Result Value Ref Range   Glucose-Capillary 105 (H) 70 - 99 mg/dL  Glucose, capillary     Status: Abnormal   Collection Time: 12/23/17  4:30 PM  Result Value Ref Range   Glucose-Capillary 121 (H) 70 - 99 mg/dL    Blood Alcohol level:  Lab Results  Component Value Date   ETH <10 75/79/7282    Metabolic Disorder Labs: Lab Results  Component Value Date   HGBA1C 16.7 (H) 12/09/2017   MPG 432.59 12/09/2017   No results found for: PROLACTIN Lab Results  Component Value Date   CHOL 142 03/05/2017   TRIG 148 03/05/2017   HDL 27 (L) 03/05/2017   CHOLHDL 5.3 (H) 03/05/2017   LDLCALC 85 03/05/2017   [place] Comment 12/10/2016    Physical Findings: AIMS:  , ,  ,  ,  CIWA:    COWS:     Musculoskeletal: Strength & Muscle Tone: within normal limits Gait & Station: normal Patient leans: N/A  Psychiatric Specialty Exam: Physical Exam  Nursing note and vitals reviewed. Constitutional: He appears well-developed and well-nourished.  HENT:  Head: Normocephalic and atraumatic.  Eyes: Pupils are equal, round, and reactive to light. Conjunctivae are normal.  Neck: Normal range of motion.  Cardiovascular: Regular rhythm and normal heart sounds.  Respiratory: Effort normal. No respiratory distress.  GI: Soft.  Musculoskeletal: Normal range of motion.  Neurological: He is alert.  Skin: Skin is warm and dry.  Psychiatric: Judgment normal. His affect is blunt. His speech is delayed. He is slowed.  Cognition and memory are normal. He expresses suicidal ideation. He expresses no suicidal plans.    Review of Systems  Constitutional: Negative.   HENT: Negative.   Eyes: Negative.   Respiratory: Negative.   Cardiovascular: Negative.   Gastrointestinal: Negative.   Musculoskeletal: Negative.   Skin: Negative.   Neurological: Negative.   Psychiatric/Behavioral: Positive for depression and suicidal ideas. Negative for substance abuse. The patient is nervous/anxious.     Blood pressure (!) 141/91, pulse 74, temperature 98.2 F (36.8 C), temperature source Oral, resp. rate 18, height 5\' 11"  (1.803 m), weight 124.3 kg (274 lb), SpO2 94 %.Body mass index is 38.22 kg/m.  General Appearance: Casual  Eye Contact:  Fair  Speech:  Clear and Coherent  Volume:  Normal  Mood:  Dysphoric  Affect:  Depressed  Thought Process:  Goal Directed  Orientation:  Full (Time, Place, and Person)  Thought Content:  Logical  Suicidal Thoughts:  Yes.  without intent/plan  Homicidal Thoughts:  No  Memory:  Immediate;   Fair Recent;   Fair Remote;   Fair  Judgement:  Impaired  Insight:  Shallow  Psychomotor Activity:  Decreased  Concentration:  Concentration: Fair  Recall:  [employer] of Knowledge:  Fair  Language:  Fair  Akathisia:  No  Handed:  Right  AIMS (if indicated):     Assets:  Desire for Improvement  ADL's:  Intact  Cognition:  WNL  Sleep:  Number of Hours: 6.3     Treatment Plan Summary: Daily contact with patient to assess and evaluate symptoms and progress in treatment, Medication management and Plan Continue current medicine.  Patient is doing well without his insulin with his blood sugars staying normal.  Next ECT Friday.  We discussed discharge planning and he may be ready to go by then.  Alethia Berthold, MD 12/23/2017, 7:03 PM

## 2017-12-23 NOTE — Transfer of Care (Signed)
Immediate Anesthesia Transfer of Care Note  Patient: Marc Long  Procedure(s) Performed: ECT TX  Patient Location: PACU  Anesthesia Type:General  Level of Consciousness: sedated  Airway & Oxygen Therapy: Patient Spontanous Breathing and Patient connected to face mask oxygen  Post-op Assessment: Report given to RN and Post -op Vital signs reviewed and stable  Post vital signs: Reviewed and stable  Last Vitals:  Vitals Value Taken Time  BP 127/89 12/23/2017 11:01 AM  Temp    Pulse 73 12/23/2017 11:02 AM  Resp 29 12/23/2017 11:02 AM  SpO2 95 % 12/23/2017 11:02 AM  Vitals shown include unvalidated device data.  Last Pain:  Vitals:   12/23/17 0842  TempSrc: Oral  PainSc: 0-No pain      Patients Stated Pain Goal: 0 (20/94/70 9628)  Complications: No apparent anesthesia complications

## 2017-12-23 NOTE — Anesthesia Postprocedure Evaluation (Signed)
Anesthesia Post Note  Patient: Marc Long  Procedure(s) Performed: ECT TX  Patient location during evaluation: PACU Anesthesia Type: General Level of consciousness: awake and alert and oriented Pain management: pain level controlled Vital Signs Assessment: post-procedure vital signs reviewed and stable Respiratory status: spontaneous breathing Cardiovascular status: blood pressure returned to baseline Anesthetic complications: no     Last Vitals:  Vitals:   12/23/17 1130 12/23/17 1455  BP: (!) 141/91   Pulse: 70 74  Resp: (!) 25 18  Temp: 37.3 C 36.8 C  SpO2: 94%     Last Pain:  Vitals:   12/23/17 1455  TempSrc: Oral  PainSc: 0-No pain                 Pattijo Juste

## 2017-12-23 NOTE — Anesthesia Preprocedure Evaluation (Signed)
Anesthesia Evaluation  Patient identified by MRN, date of birth, ID band Patient awake    Reviewed: Allergy & Precautions, NPO status , Patient's Chart, lab work & pertinent test results  History of Anesthesia Complications Negative for: history of anesthetic complications  Airway Mallampati: III       Dental  (+) Dental Advidsory Given   Pulmonary neg sleep apnea, neg COPD,           Cardiovascular (-) hypertension(-) Past MI and (-) CHF (-) dysrhythmias (-) Valvular Problems/Murmurs     Neuro/Psych neg Seizures PSYCHIATRIC DISORDERS Depression Bipolar Disorder    GI/Hepatic Neg liver ROS, neg GERD (resolved with weight loss)  ,  Endo/Other  diabetes, Type 2, Oral Hypoglycemic Agents, Insulin DependentMorbid obesity  Renal/GU Renal disease     Musculoskeletal   Abdominal   Peds  Hematology   Anesthesia Other Findings Past Medical History: No date: Depression No date: Diabetes mellitus without complication (HCC) No date: Gallstones No date: Obesity, Class III, BMI 40-49.9 (morbid obesity) (HCC)   Reproductive/Obstetrics                             Anesthesia Physical  Anesthesia Plan  ASA: III  Anesthesia Plan: General   Post-op Pain Management:    Induction: Intravenous  PONV Risk Score and Plan: TIVA  Airway Management Planned: Mask  Additional Equipment:   Intra-op Plan:   Post-operative Plan:   Informed Consent: I have reviewed the patients History and Physical, chart, labs and discussed the procedure including the risks, benefits and alternatives for the proposed anesthesia with the patient or authorized representative who has indicated his/her understanding and acceptance.     Plan Discussed with:   Anesthesia Plan Comments:         Anesthesia Quick Evaluation

## 2017-12-23 NOTE — [facility] Group Notes (Signed)
LCSW Group Therapy Note  12/23/2017 1:00pm  Type of Therapy/Topic:  Group Therapy:  Emotion Regulation  Participation Level:  Did Not Attend   Description of Group:    The purpose of this group is to assist patients in learning to regulate negative emotions and experience positive emotions. Patients will be guided to discuss ways in which they have been vulnerable to their negative emotions. These vulnerabilities will be juxtaposed with experiences of positive emotions or situations, and patients will be challenged to use positive emotions to combat negative ones. Special emphasis will be placed on coping with negative emotions in conflict situations, and patients will process healthy conflict resolution skills.  Therapeutic Goals: 1. Patient will identify two positive emotions or experiences to reflect on in order to balance out negative emotions 2. Patient will label two or more emotions that they find the most difficult to experience 3. Patient will demonstrate positive conflict resolution skills through discussion and/or role plays  Summary of Patient Progress: Marc Long was invited to today's group, but chose not to attend.      Therapeutic Modalities:   Cognitive Behavioral Therapy Feelings Identification Dialectical Behavioral Therapy

## 2017-12-24 ENCOUNTER — Other Ambulatory Visit: Payer: Self-pay | Admitting: Psychiatry

## 2017-12-24 LAB — GLUCOSE, CAPILLARY
GLUCOSE-CAPILLARY: 109 mg/dL — AB (ref 70–99)
Glucose-Capillary: 113 mg/dL — ABNORMAL HIGH (ref 70–99)
Glucose-Capillary: 103 mg/dL — ABNORMAL HIGH (ref 70–99)
Glucose-Capillary: 108 mg/dL — ABNORMAL HIGH (ref 70–99)

## 2017-12-24 NOTE — Progress Notes (Signed)
[facility] MD Progress Note  12/24/2017 6:25 PM Marc Long  MRN:  [id number] Subjective: Follow-up 42 year old man with a history of depression.  Patient seen chart reviewed.  Patient states that his mood is better today.  Does not have intrusive thoughts of self-harm or hurting anybody else.  He is having a headache this evening but otherwise has no physical complaints. Principal Problem: Severe recurrent major depression without psychotic features ([place]) Diagnosis:   Patient Active Problem List   Diagnosis Date Noted  . Severe recurrent major depression without psychotic features ([place]) [F33.2] 12/16/2017  . MDD (major depressive disorder), recurrent episode, severe ([place]) [F33.2] 12/10/2017  . Obesity (BMI 30-39.9) [E66.9] 12/09/2017  . Depression with suicidal ideation [F32.9, R45.851]   . DKA (diabetic ketoacidoses) ([place]) [E13.10] 12/05/2017  . Hypertriglyceridemia [E78.1] 02/05/2017  . DM (diabetes mellitus), type 2, uncontrolled ([place]) [E11.65] 12/12/2016  . Dehydration [E86.0] 12/12/2016  . Suicidal ideations [R45.851] 12/12/2016  . Bipolar disorder (manic depression) ([place]) [F31.9] 12/12/2016  . Hyperglycemia due to type 2 diabetes mellitus ([place]) [E11.65] 12/12/2016  . AKI (acute kidney injury) ([place]) [N17.9] 12/12/2016  . DKA, type 2, not at goal [facility]) [E11.10] 12/12/2016  . Obesity, Class III, BMI 40-49.9 (morbid obesity) ([place]) [E66.01]   . Type 2 diabetes mellitus without complication, without long-term current use of insulin ([place]) [E11.9] 12/10/2016  . Lithium use [Z79.899] 12/10/2016  . Moderate single current episode of major depressive disorder ([place]) [F32.1] 04/15/2016  . Pain in joint, shoulder region [M25.519] 04/14/2016  . Diverticulosis of large intestine without hemorrhage [K57.30] 11/07/2015  . Calculus of gallbladder without cholecystitis [K80.20] 11/07/2015   Total Time spent with patient: 20 minutes  Past Psychiatric History: History of recurrent  depression  Past Medical History:  Past Medical History:  Diagnosis Date  . Depression   . Diabetes mellitus without complication ([place])   . Gallstones   . Obesity, Class III, BMI 40-49.9 (morbid obesity) ([place])    History reviewed. No pertinent surgical history. Family History:  Family History  Problem Relation Age of Onset  . Hypertension Mother   . Depression Mother   . Heart attack Maternal Grandfather   . Lymphoma Maternal Grandmother   . Diabetes Maternal Aunt   . Cancer Maternal Aunt    Family Psychiatric  History: None Social History:  Social History   Substance and Sexual Activity  Alcohol Use Yes  . Alcohol/week: 0.0 oz   Comment: once every 2 weeks. Wine coolers     Social History   Substance and Sexual Activity  Drug Use No    Social History   Socioeconomic History  . Marital status: Single    Spouse name: Not on file  . Number of children: 0  . Years of education: Not on file  . Highest education level: Not on file  Occupational History  . Occupation: call center  Social Needs  . Financial resource strain: Not on file  . Food insecurity:    Worry: Not on file    Inability: Not on file  . Transportation needs:    Medical: Not on file    Non-medical: Not on file  Tobacco Use  . Smoking status: Never Smoker  . Smokeless tobacco: Never Used  Substance and Sexual Activity  . Alcohol use: Yes    Alcohol/week: 0.0 oz    Comment: once every 2 weeks. Wine coolers  . Drug use: No  . Sexual activity: Not Currently    Birth control/protection: None  Lifestyle  .  Physical activity:    Days per week: Not on file    Minutes per session: Not on file  . Stress: Not on file  Relationships  . Social connections:    Talks on phone: Not on file    Gets together: Not on file    Attends religious service: Not on file    Active member of club or organization: Not on file    Attends meetings of clubs or organizations: Not on file    Relationship status: Not  on file  Other Topics Concern  . Not on file  Social History Narrative  . Not on file   Additional Social History:                         Sleep: Fair  Appetite:  Fair  Current Medications: Current Facility-Administered Medications  Medication Dose Route Frequency Provider Last Rate Last Dose  . acetaminophen (TYLENOL) tablet 650 mg  650 mg Oral Q6H PRN Xai Frerking, Madie Reno, MD   650 mg at 12/18/17 1347  . alum & mag hydroxide-simeth (MAALOX/MYLANTA) 200-200-20 MG/5ML suspension 30 mL  30 mL Oral Q4H PRN Xochilth Standish, Alexsander T, MD      . busPIRone (BUSPAR) tablet 10 mg  10 mg Oral TID Carlethia Mesquita, Madie Reno, MD   10 mg at 12/24/17 1704  . feeding supplement (GLUCERNA SHAKE) (GLUCERNA SHAKE) liquid 237 mL  237 mL Oral TID BM Alverta Caccamo, Zaccheaus T, MD   237 mL at 12/24/17 1428  . fentaNYL (SUBLIMAZE) injection 25 mcg  25 mcg Intravenous Q5 min PRN Alvin Critchley, MD      . FLUoxetine (PROZAC) capsule 40 mg  40 mg Oral Daily Carolynn Tuley, Madie Reno, MD   40 mg at 12/24/17 0846  . hydrocortisone cream 1 %   Topical BID PRN Shayne Diguglielmo, Madie Reno, MD      . hydrOXYzine (ATARAX/VISTARIL) tablet 50 mg  50 mg Oral TID PRN Marguis Mathieson, Madie Reno, MD   50 mg at 12/23/17 2112  . ibuprofen (ADVIL,MOTRIN) tablet 800 mg  800 mg Oral Q6H PRN Lenward Chancellor, MD   800 mg at 12/24/17 1705  . insulin aspart (novoLOG) injection 0-9 Units  0-9 Units Subcutaneous TID WC Shayan Bramhall, Madie Reno, MD   1 Units at 12/23/17 1635  . magnesium hydroxide (MILK OF MAGNESIA) suspension 30 mL  30 mL Oral Daily PRN Annmarie Plemmons, Anush T, MD      . metFORMIN (GLUCOPHAGE) tablet 1,000 mg  1,000 mg Oral BID WC Kyeshia Zinn, [place] T, MD   1,000 mg at 12/24/17 1704  . midazolam (VERSED) injection 2 mg  2 mg Intravenous Once Terrilee Dudzik, Brandyn T, MD      . midazolam (VERSED) injection 2 mg  2 mg Intravenous Once Shawonda Kerce, Braiden T, MD      . midazolam (VERSED) injection 2 mg  2 mg Intravenous Once Pawel Soules, Dallon T, MD      . multivitamin with minerals tablet 1 tablet  1 tablet Oral Daily  Oliwia Berzins, Madie Reno, MD   1 tablet at 12/24/17 0846  . ondansetron (ZOFRAN) injection 4 mg  4 mg Intravenous Once PRN Gunnar Fusi, MD      . ondansetron [facility]) injection 4 mg  4 mg Intravenous Once PRN Alvin Critchley, MD      . pantoprazole (PROTONIX) EC tablet 40 mg  40 mg Oral Daily Elizabethanne Lusher, Madie Reno, MD   40 mg at 12/24/17 0846  . polyethylene glycol (MIRALAX /  GLYCOLAX) packet 17 g  17 g Oral Daily Yanky Vanderburg, Madie Reno, MD   17 g at 12/19/17 8127  . QUEtiapine (SEROQUEL) tablet 100 mg  100 mg Oral QHS [place], Refujio T, MD   100 mg at 12/23/17 2112  . traZODone (DESYREL) tablet 100 mg  100 mg Oral QHS PRN Aquinnah Devin, Madie Reno, MD   100 mg at 12/23/17 2112  . traZODone (DESYREL) tablet 50 mg  50 mg Oral QHS Trystan Eads, Madie Reno, MD   50 mg at 12/23/17 2112    Lab Results:  Results for orders placed or performed during the hospital encounter of 12/16/17 (from the past 48 hour(s))  Glucose, capillary     Status: None   Collection Time: 12/22/17  8:54 PM  Result Value Ref Range   Glucose-Capillary 95 70 - 99 mg/dL  Glucose, capillary     Status: Abnormal   Collection Time: 12/23/17  6:28 AM  Result Value Ref Range   Glucose-Capillary 117 (H) 70 - 99 mg/dL  Glucose, capillary     Status: Abnormal   Collection Time: 12/23/17 11:04 AM  Result Value Ref Range   Glucose-Capillary 105 (H) 70 - 99 mg/dL  Glucose, capillary     Status: Abnormal   Collection Time: 12/23/17  4:30 PM  Result Value Ref Range   Glucose-Capillary 121 (H) 70 - 99 mg/dL  Glucose, capillary     Status: Abnormal   Collection Time: 12/23/17  8:52 PM  Result Value Ref Range   Glucose-Capillary 126 (H) 70 - 99 mg/dL  Glucose, capillary     Status: Abnormal   Collection Time: 12/24/17  6:52 AM  Result Value Ref Range   Glucose-Capillary 113 (H) 70 - 99 mg/dL  Glucose, capillary     Status: Abnormal   Collection Time: 12/24/17 11:45 AM  Result Value Ref Range   Glucose-Capillary 103 (H) 70 - 99 mg/dL  Glucose, capillary      Status: Abnormal   Collection Time: 12/24/17  4:34 PM  Result Value Ref Range   Glucose-Capillary 109 (H) 70 - 99 mg/dL   Comment 1 Notify RN     Blood Alcohol level:  Lab Results  Component Value Date   ETH <10 51/70/0174    Metabolic Disorder Labs: Lab Results  Component Value Date   HGBA1C 16.7 (H) 12/09/2017   MPG 432.59 12/09/2017   No results found for: PROLACTIN Lab Results  Component Value Date   CHOL 142 03/05/2017   TRIG 148 03/05/2017   HDL 27 (L) 03/05/2017   CHOLHDL 5.3 (H) 03/05/2017   LDLCALC 85 03/05/2017   [place] Comment 12/10/2016    Physical Findings: AIMS:  , ,  ,  ,    CIWA:    COWS:     Musculoskeletal: Strength & Muscle Tone: within normal limits Gait & Station: normal Patient leans: N/A  Psychiatric Specialty Exam: Physical Exam  Nursing note and vitals reviewed. Constitutional: He appears well-developed and well-nourished.  HENT:  Head: Normocephalic and atraumatic.  Eyes: Pupils are equal, round, and reactive to light. Conjunctivae are normal.  Neck: Normal range of motion.  Cardiovascular: Regular rhythm and normal heart sounds.  Respiratory: Effort normal. No respiratory distress.  GI: Soft.  Musculoskeletal: Normal range of motion.  Neurological: He is alert.  Skin: Skin is warm and dry.  Psychiatric: He has a normal mood and affect. His behavior is normal. Thought content normal.    Review of Systems  Constitutional: Negative.   HENT:  Negative.   Eyes: Negative.   Respiratory: Negative.   Cardiovascular: Negative.   Gastrointestinal: Negative.   Musculoskeletal: Negative.   Skin: Negative.   Neurological: Positive for headaches.  Psychiatric/Behavioral: Negative.     Blood pressure 114/67, pulse (!) 113, temperature 98.2 F (36.8 C), temperature source Oral, resp. rate 18, height 5\' 11"  (1.803 m), weight 124.3 kg (274 lb), SpO2 97 %.Body mass index is 38.22 kg/m.  General Appearance: Casual  Eye Contact:  Good   Speech:  Clear and Coherent  Volume:  Normal  Mood:  Euthymic  Affect:  Constricted  Thought Process:  Coherent  Orientation:  Full (Time, Place, and Person)  Thought Content:  Logical  Suicidal Thoughts:  No  Homicidal Thoughts:  No  Memory:  Immediate;   Fair Recent;   Fair Remote;   Fair  Judgement:  Fair  Insight:  Fair  Psychomotor Activity:  Decreased  Concentration:  Concentration: Fair  Recall:  [employer] of Knowledge:  Fair  Language:  Fair  Akathisia:  No  Handed:  Right  AIMS (if indicated):     Assets:  Communication Skills Desire for Improvement Housing Physical Health  ADL's:  Intact  Cognition:  WNL  Sleep:  Number of Hours: 6.25     Treatment Plan Summary: Daily contact with patient to assess and evaluate symptoms and progress in treatment, Medication management and Plan Patient will have ECT tomorrow morning.  I suggested to him today that we consider discharge tomorrow evening if he is feeling safe and he is agreeable to considering it.  No change to medicine for now.  Supportive counseling and review of treatment plan.  Blood sugars appear to be stable as do all of his vital signs.  Alethia Berthold, MD 12/24/2017, 6:25 PM

## 2017-12-24 NOTE — B[facility] Group Notes (Signed)
LCSW Group Therapy Note 12/24/2017 9:00 AM  Type of Therapy and Topic:  Group Therapy:  Setting Goals  Participation Level:  Active  Description of Group: In this process group, patients discussed using strengths to work toward goals and address challenges.  Patients identified two positive things about themselves and one goal they were working on.  Patients were given the opportunity to share openly and support each other's plan for self-empowerment.  The group discussed the value of gratitude and were encouraged to have a daily reflection of positive characteristics or circumstances.  Patients were encouraged to identify a plan to utilize their strengths to work on current challenges and goals.  Therapeutic Goals 1. Patient will verbalize personal strengths/positive qualities and relate how these can assist with achieving desired personal goals 2. Patients will verbalize affirmation of peers plans for personal change and goal setting 3. Patients will explore the value of gratitude and positive focus as related to successful achievement of goals 4. Patients will verbalize a plan for regular reinforcement of personal positive qualities and circumstances.  Summary of Patient Progress:  Marc Long actively participated in today's group discussion on setting SMART goals.  Marc Long shared that he set a goal since he has been in the hospital to learn as many positive coping skills that he can so he can exist "normally" when he is discharged back home.  Marc Long shared that he plans to purchase a cork board where he can post his goals so he can see his progress.     Therapeutic Modalities Cognitive Behavioral Therapy Motivational Interviewing    Devona Konig, [place] 12/24/2017 1:54 PM

## 2017-12-24 NOTE — [facility] Group Notes (Signed)
  12/24/2017  Time: 1PM  Type of Therapy/Topic:  Group Therapy:  Balance in Life  Participation Level:  Active  Description of Group:   This group will address the concept of balance and how it feels and looks when one is unbalanced. Patients will be encouraged to process areas in their lives that are out of balance and identify reasons for remaining unbalanced. Facilitators will guide patients in utilizing problem-solving interventions to address and correct the stressor making their life unbalanced. Understanding and applying boundaries will be explored and addressed for obtaining and maintaining a balanced life. Patients will be encouraged to explore ways to assertively make their unbalanced needs known to significant others in their lives, using other group members and facilitator for support and feedback.  Therapeutic Goals: 1. Patient will identify two or more emotions or situations they have that consume much of in their lives. 2. Patient will identify signs/triggers that life has become out of balance:  3. Patient will identify two ways to set boundaries in order to achieve balance in their lives:  4. Patient will demonstrate ability to communicate their needs through discussion and/or role plays  Summary of Patient Progress: Pt continues to work towards their tx goals but has not yet reached them. Pt was able to appropriately participate in group discussion, and was able to offer support/validation to other group members. Pt reported he is feeling, "better than I have in a long time," today. Pt reported one area of his life he would like to spend less time on is, "worrying about my mental health." Pt reported one area of his life he would like to spend more time on is, "friends and family."      Therapeutic Modalities:   Cognitive Behavioral Therapy Solution-Focused Therapy Assertiveness Training  Alden Hipp, MSW, LCSW Clinical Social Worker 12/24/2017 2:36 PM

## 2017-12-24 NOTE — Plan of Care (Addendum)
Patient found in day room upon my arrival. Patient is visible and social this evening. Attends group. Reports having a "good day." Reports some improvement in mood and VH. Affect is more animated tonight. Reports eating and voiding adequately. CBG 126. Given 2nd Glucophage @1954  as per patient request as he was not able to get it @ the scheduled time. Compliant with Glucerna. Compliant with medication and staff direction. Given Vistaril and Trazodone PRN for sleep with positive results. Q 15 minute checks maintained. Will continue to monitor throughout the shift. Patient slept 6.25 hours. No apparent distress. Will endorse care to oncoming shift.  Problem: Education: Goal: Emotional status will improve Outcome: Progressing Goal: Mental status will improve Outcome: Progressing Goal: Verbalization of understanding the information provided will improve Outcome: Progressing   Problem: Coping: Goal: Coping ability will improve Outcome: Progressing   Problem: Activity: Goal: Interest or engagement in activities will improve Outcome: Progressing Goal: Sleeping patterns will improve Outcome: Progressing   Problem: Coping: Goal: Ability to demonstrate self-control will improve Outcome: Progressing

## 2017-12-24 NOTE — Progress Notes (Signed)
Recreation Therapy Notes  Date: 12/24/2017  Time: 9:30 am  Location: Craft Room  Behavioral response: Appropriate   Intervention Topic: Anger Management  Discussion/Intervention:  Group content on today was focused on anger management. The group defined anger and reasons they become angry. Individuals expressed negative way they have dealt with anger in the past. Patients stated some positive ways they could deal with anger in the future. The group described how anger can affect your health and daily plans. Individuals participated in the intervention "Score your anger" where they had a chance to answer questions about themselves and get a score of their anger.  Clinical Observations/Feedback:  Patient came to group and defined anger as ways that he deals with anger.Individual stated he normally suppresses his emotions when he is angry. He stated his voice cracking is a sign that he is angry. Marc Long LRT/CTRS         Arlando Leisinger 12/24/2017 12:08 PM

## 2017-12-24 NOTE — Progress Notes (Signed)
Recreation Therapy Notes  Date: 12/24/2017  Time: 3:00pm  Location: Craft room  Behavioral response: Appropriate  Group Type: Game  Participation level: Active  Communication: Patient was social with peers and staff.  Comments: N/A  Joziah Dollins LRT/CTRS        Robbie Rideaux 12/24/2017 3:30 PM

## 2017-12-24 NOTE — Plan of Care (Signed)
Patient is alert and oriented.  Patient states he slept well last night and energy level is normal with good concentration today. Patient rates depression 6/10; hopelessness 3/10; and anxiety 7/10. Patient states," I feel better than before maybe I will get to leave next week. Patient is pleasant and mood is bright. Patient is out of room talking with fellow peers and staff.  Patient plan is to come up with a SMART goal.  Patient blood pressure 114/67, and eating meals regularly. Patient is attending groups and believes treatment is working well. Nurse will continue to monitor. 15 Minute safety checks by MHT's continue.  Problem: Education: Goal: Knowledge of [place] [facility] Education information/materials will improve Outcome: Progressing Goal: Emotional status will improve Outcome: Progressing Goal: Mental status will improve Outcome: Progressing Goal: Verbalization of understanding the information provided will improve Outcome: Progressing   Problem: Education: Goal: Knowledge of [place] [facility] Education information/materials will improve Outcome: Progressing Goal: Emotional status will improve Outcome: Progressing Goal: Mental status will improve Outcome: Progressing Goal: Verbalization of understanding the information provided will improve Outcome: Progressing

## 2017-12-25 ENCOUNTER — Inpatient Hospital Stay: Payer: Medicaid Other | Admitting: Registered Nurse

## 2017-12-25 ENCOUNTER — Encounter: Payer: Self-pay | Admitting: Anesthesiology

## 2017-12-25 LAB — GLUCOSE, CAPILLARY
GLUCOSE-CAPILLARY: 109 mg/dL — AB (ref 70–99)
GLUCOSE-CAPILLARY: 107 mg/dL — AB (ref 70–99)
GLUCOSE-CAPILLARY: 81 mg/dL (ref 70–99)
GLUCOSE-CAPILLARY: 74 mg/dL (ref 70–99)
Glucose-Capillary: 106 mg/dL — ABNORMAL HIGH (ref 70–99)

## 2017-12-25 MED ORDER — BUSPIRONE HCL 10 MG PO TABS: 10.0000 mg | ORAL_TABLET | Freq: Three times a day (TID) | ORAL | 1 refills | Status: DC

## 2017-12-25 MED ORDER — PANTOPRAZOLE SODIUM 40 MG PO TBEC: 40.0000 mg | DELAYED_RELEASE_TABLET | Freq: Every day | ORAL | 1 refills | Status: DC

## 2017-12-25 MED ORDER — SODIUM CHLORIDE 0.9 % IV SOLN
500.0000 mL | Freq: Once | INTRAVENOUS | Status: AC
  Administered 2017-12-25: 500 mL via INTRAVENOUS

## 2017-12-25 MED ORDER — SODIUM CHLORIDE 0.9 % IV SOLN
INTRAVENOUS | Status: DC | PRN
  Administered 2017-12-25: 10:00:00 via INTRAVENOUS

## 2017-12-25 MED ORDER — ONDANSETRON HCL 4 MG/2ML IJ SOLN: 4.0000 mg | Freq: Once | INTRAMUSCULAR | Status: DC | PRN

## 2017-12-25 MED ORDER — FLUOXETINE HCL 40 MG PO CAPS: 40.0000 mg | ORAL_CAPSULE | Freq: Every day | ORAL | 1 refills | Status: DC

## 2017-12-25 MED ORDER — SUCCINYLCHOLINE CHLORIDE 20 MG/ML IJ SOLN
INTRAMUSCULAR | Status: DC | PRN
  Administered 2017-12-25: 140 mg via INTRAVENOUS

## 2017-12-25 MED ORDER — SUCCINYLCHOLINE CHLORIDE 20 MG/ML IJ SOLN
INTRAMUSCULAR | Status: AC
  Filled 2017-12-25: qty 1

## 2017-12-25 MED ORDER — GLYCOPYRROLATE 0.2 MG/ML IJ SOLN
INTRAMUSCULAR | Status: AC
  Filled 2017-12-25: qty 1

## 2017-12-25 MED ORDER — KETAMINE HCL 10 MG/ML IJ SOLN
INTRAMUSCULAR | Status: DC | PRN
  Administered 2017-12-25: 150 mg via INTRAVENOUS

## 2017-12-25 MED ORDER — MIDAZOLAM HCL 2 MG/2ML IJ SOLN
INTRAMUSCULAR | Status: AC
  Filled 2017-12-25: qty 2

## 2017-12-25 MED ORDER — MIDAZOLAM HCL 2 MG/2ML IJ SOLN: 2.0000 mg | Freq: Once | INTRAMUSCULAR | Status: DC

## 2017-12-25 MED ORDER — MIDAZOLAM HCL 2 MG/2ML IJ SOLN
INTRAMUSCULAR | Status: DC | PRN
  Administered 2017-12-25: 2 mg via INTRAVENOUS

## 2017-12-25 MED ORDER — FENTANYL CITRATE (PF) 100 MCG/2ML IJ SOLN: 25.0000 ug | INTRAMUSCULAR | Status: DC | PRN

## 2017-12-25 MED ORDER — KETOROLAC TROMETHAMINE 30 MG/ML IJ SOLN
INTRAMUSCULAR | Status: AC
  Filled 2017-12-25: qty 1

## 2017-12-25 MED ORDER — QUETIAPINE FUMARATE 100 MG PO TABS: 100.0000 mg | ORAL_TABLET | Freq: Every day | ORAL | 1 refills | Status: DC

## 2017-12-25 MED ORDER — ADULT MULTIVITAMIN W/MINERALS CH: 1.0000 | ORAL_TABLET | Freq: Every day | ORAL | 0 refills | Status: DC

## 2017-12-25 MED ORDER — POLYETHYLENE GLYCOL 3350 17 G PO PACK: 17.0000 g | PACK | Freq: Every day | ORAL | 1 refills | Status: DC

## 2017-12-25 MED ORDER — METFORMIN HCL 1000 MG PO TABS: 1000.0000 mg | ORAL_TABLET | Freq: Two times a day (BID) | ORAL | 1 refills | Status: DC

## 2017-12-25 MED ORDER — TRAZODONE HCL 100 MG PO TABS: 100.0000 mg | ORAL_TABLET | Freq: Every evening | ORAL | 1 refills | Status: DC | PRN

## 2017-12-25 MED ORDER — GLYCOPYRROLATE 0.2 MG/ML IJ SOLN
0.2000 mg | Freq: Once | INTRAMUSCULAR | Status: AC
  Administered 2017-12-25: 0.2 mg via INTRAVENOUS

## 2017-12-25 MED ORDER — HYDROXYZINE HCL 50 MG PO TABS: 50.0000 mg | ORAL_TABLET | Freq: Three times a day (TID) | ORAL | 1 refills | Status: DC | PRN

## 2017-12-25 MED ORDER — KETOROLAC TROMETHAMINE 30 MG/ML IJ SOLN
30.0000 mg | Freq: Once | INTRAMUSCULAR | Status: AC
  Administered 2017-12-25: 30 mg via INTRAVENOUS

## 2017-12-25 NOTE — Progress Notes (Signed)
Post ECT assessment. Patient is oriented x4, pleasant and has no complaints at this time. Patient stated that he is prepared to D/C tomorrow. Patient denies SI/HI/AVH.

## 2017-12-25 NOTE — Procedures (Signed)
ECT SERVICES Physician's Interval Evaluation & Treatment Note  Patient Identification: Eliakim Tendler MRN:  [id number] Date of Evaluation:  12/25/2017 TX #: [id number]  MADRS:   MMSE:   P.E. Findings:  No change to physical exam  Psychiatric Interval Note:  Mood is reported as being significantly better.  Less anxious.  Subjective:  Patient is a 42 y.o. male seen for evaluation for Electroconvulsive Therapy. Patient is feeling better with less intrusive thoughts  Treatment Summary:   [x]   Right Unilateral             []  Bilateral   % Energy : 0.3 ms 100%   Impedance: 420 ohms  Seizure Energy Index: 8062 V squared  Postictal Suppression Index: 64%  Seizure Concordance Index: 97%  Medications  Pre Shock: Toradol 30 mg ketamine 150 mg succinylcholine 140 mg  Post Shock: Versed 2 mg  Seizure Duration: 24 seconds EMG 40 seconds EEG   Comments: Discharging home today and return for maintenance next Friday  Lungs:  [x]   Clear to auscultation               []  Other:   Heart:    [x]   Regular rhythm             []  irregular rhythm    [x]   Previous H&P reviewed, patient examined and there are NO CHANGES                 []   Previous H&P reviewed, patient examined and there are changes noted.   Alethia Berthold, MD 7/12/201910:13 AM

## 2017-12-25 NOTE — Progress Notes (Signed)
Recreation Therapy Notes  INPATIENT RECREATION TR PLAN  Patient Details Name: Marc Long MRN: [id number] DOB: 1975-12-15 Today's Date: 12/25/2017  Rec Therapy Plan Is patient appropriate for Therapeutic Recreation?: Yes Treatment times per week: at least 3 Estimated Length of Stay: 5-7 days TR Treatment/Interventions: Group participation (Comment)  Discharge Criteria Pt will be discharged from therapy if:: Discharged Treatment plan/goals/alternatives discussed and agreed upon by:: Patient/family  Discharge Summary Short term goals set: Patient will identify 3 positive coping skills to decrease depressive symptoms within 5 recreation therapy group sessions Short term goals met: Adequate for discharge Progress toward goals comments: Groups attended Which groups?: Anger management, Self-esteem Reason goals not met: N/A Therapeutic equipment acquired: N/A Reason patient discharged from therapy: Discharge from hospital Pt/family agrees with progress & goals achieved: Yes Date patient discharged from therapy: 12/25/17   [name] 12/25/2017, 1:11 PM

## 2017-12-25 NOTE — Anesthesia Preprocedure Evaluation (Signed)
Anesthesia Evaluation  Patient identified by MRN, date of birth, ID band Patient awake    Reviewed: Allergy & Precautions, NPO status , Patient's Chart, lab work & pertinent test results  History of Anesthesia Complications Negative for: history of anesthetic complications  Airway Mallampati: III       Dental  (+) Dental Advidsory Given   Pulmonary neg sleep apnea, neg COPD,           Cardiovascular (-) hypertension(-) Past MI and (-) CHF (-) dysrhythmias (-) Valvular Problems/Murmurs     Neuro/Psych neg Seizures PSYCHIATRIC DISORDERS Depression Bipolar Disorder    GI/Hepatic Neg liver ROS, neg GERD (resolved with weight loss)  ,  Endo/Other  diabetes, Type 2, Oral Hypoglycemic Agents, Insulin DependentMorbid obesity  Renal/GU Renal disease     Musculoskeletal   Abdominal   Peds  Hematology   Anesthesia Other Findings Past Medical History: No date: Depression No date: Diabetes mellitus without complication (HCC) No date: Gallstones No date: Obesity, Class III, BMI 40-49.9 (morbid obesity) (HCC)   Reproductive/Obstetrics                             Anesthesia Physical  Anesthesia Plan  ASA: III  Anesthesia Plan: General   Post-op Pain Management:    Induction: Intravenous  PONV Risk Score and Plan: TIVA  Airway Management Planned: Mask  Additional Equipment:   Intra-op Plan:   Post-operative Plan:   Informed Consent: I have reviewed the patients History and Physical, chart, labs and discussed the procedure including the risks, benefits and alternatives for the proposed anesthesia with the patient or authorized representative who has indicated his/her understanding and acceptance.     Plan Discussed with:   Anesthesia Plan Comments:         Anesthesia Quick Evaluation

## 2017-12-25 NOTE — Anesthesia Procedure Notes (Signed)
Performed by: River Ambrosio, CRNA Pre-anesthesia Checklist: Patient identified, Emergency Drugs available, Suction available and Patient being monitored Patient Re-evaluated:Patient Re-evaluated prior to induction Oxygen Delivery Method: Circle system utilized Preoxygenation: Pre-oxygenation with 100% oxygen Induction Type: IV induction Ventilation: Mask ventilation without difficulty and Mask ventilation throughout procedure Airway Equipment and Method: Bite block Placement Confirmation: positive ETCO2 Dental Injury: Teeth and Oropharynx as per pre-operative assessment        

## 2017-12-25 NOTE — Anesthesia Post-op Follow-up Note (Signed)
Anesthesia QCDR form completed.        

## 2017-12-25 NOTE — [facility] Group Notes (Signed)

## 2017-12-25 NOTE — H&P (Signed)
Marc Long is an 42 y.o. male.   Chief Complaint: Patient is feeling better.  Intrusive thoughts are less. HPI: History of recurrent depression and suicidal ideation  Past Medical History:  Diagnosis Date  . Depression   . Diabetes mellitus without complication ([place])   . Gallstones   . Obesity, Class III, BMI 40-49.9 (morbid obesity) ([place])     History reviewed. No pertinent surgical history.  Family History  Problem Relation Age of Onset  . Hypertension Mother   . Depression Mother   . Heart attack Maternal Grandfather   . Lymphoma Maternal Grandmother   . Diabetes Maternal Aunt   . Cancer Maternal Aunt    Social History:  reports that he has never smoked. He has never used smokeless tobacco. He reports that he drinks alcohol. He reports that he does not use drugs.  Allergies:  Allergies  Allergen Reactions  . Bee Venom Swelling  . Keflex [Cephalexin]     UPSET STOMACH  . Relafen [Nabumetone]     Blood in stool  . Shellfish Allergy Nausea Only    Medications Prior to Admission  Medication Sig Dispense Refill  . busPIRone (BUSPAR) 10 MG tablet Take 10 mg by mouth 3 (three) times daily.  1  . FLUoxetine (PROZAC) 40 MG capsule Take 1 capsule (40 mg total) by mouth daily.  3  . hydrocortisone cream 1 % Apply topically 2 (two) times daily as needed for itching. 30 g 0  . insulin aspart (NOVOLOG) 100 UNIT/ML injection Inject 10 Units into the skin 3 (three) times daily with meals. 10 mL 11  . insulin glargine (LANTUS) 100 UNIT/ML injection Inject 0.25 mLs (25 Units total) into the skin daily. 10 mL 11  . lamoTRIgine (LAMICTAL) 25 MG tablet Take 1 tablet (25 mg total) by mouth 2 (two) times daily.    . metFORMIN (GLUCOPHAGE) 1000 MG tablet Take 1 tablet (1,000 mg total) by mouth 2 (two) times daily with a meal. 180 tablet 1  . pantoprazole (PROTONIX) 40 MG tablet Take 1 tablet (40 mg total) by mouth 2 (two) times daily.    . polyethylene glycol (MIRALAX / GLYCOLAX)  packet Take 17 g by mouth daily. 14 each 0  . QUEtiapine (SEROQUEL) 50 MG tablet Take 1 tablet (50 mg total) by mouth at bedtime. For sleep and mood    . traZODone (DESYREL) 50 MG tablet Take 1 tablet (50 mg total) by mouth at bedtime and may repeat dose one time if needed.      Results for orders placed or performed during the hospital encounter of 12/16/17 (from the past 48 hour(s))  Glucose, capillary     Status: Abnormal   Collection Time: 12/23/17 11:04 AM  Result Value Ref Range   Glucose-Capillary 105 (H) 70 - 99 mg/dL  Glucose, capillary     Status: Abnormal   Collection Time: 12/23/17  4:30 PM  Result Value Ref Range   Glucose-Capillary 121 (H) 70 - 99 mg/dL  Glucose, capillary     Status: Abnormal   Collection Time: 12/23/17  8:52 PM  Result Value Ref Range   Glucose-Capillary 126 (H) 70 - 99 mg/dL  Glucose, capillary     Status: Abnormal   Collection Time: 12/24/17  6:52 AM  Result Value Ref Range   Glucose-Capillary 113 (H) 70 - 99 mg/dL  Glucose, capillary     Status: Abnormal   Collection Time: 12/24/17 11:45 AM  Result Value Ref Range   Glucose-Capillary 103 (  H) 70 - 99 mg/dL  Glucose, capillary     Status: Abnormal   Collection Time: 12/24/17  4:34 PM  Result Value Ref Range   Glucose-Capillary 109 (H) 70 - 99 mg/dL   Comment 1 Notify RN   Glucose, capillary     Status: Abnormal   Collection Time: 12/24/17  8:29 PM  Result Value Ref Range   Glucose-Capillary 108 (H) 70 - 99 mg/dL   Comment 1 Notify RN   Glucose, capillary     Status: Abnormal   Collection Time: 12/25/17  7:09 AM  Result Value Ref Range   Glucose-Capillary 109 (H) 70 - 99 mg/dL   Comment 1 Notify RN    No results found.  Review of Systems  Constitutional: Negative.   HENT: Negative.   Eyes: Negative.   Respiratory: Negative.   Cardiovascular: Negative.   Gastrointestinal: Negative.   Musculoskeletal: Negative.   Skin: Negative.   Neurological: Negative.   Psychiatric/Behavioral:  Negative.     Blood pressure (!) 135/94, pulse 73, temperature (!) 97.1 F (36.2 C), temperature source Temporal, resp. rate 18, height 5\' 11"  (1.803 m), weight 124.3 kg (274 lb), SpO2 100 %. Physical Exam  Nursing note and vitals reviewed. Constitutional: He appears well-developed and well-nourished.  HENT:  Head: Normocephalic and atraumatic.  Eyes: Pupils are equal, round, and reactive to light. Conjunctivae are normal.  Neck: Normal range of motion.  Cardiovascular: Regular rhythm and normal heart sounds.  Respiratory: Effort normal.  GI: Soft.  Musculoskeletal: Normal range of motion.  Neurological: He is alert.  Skin: Skin is warm and dry.  Psychiatric: His speech is normal and behavior is normal. Judgment and thought content normal. His affect is blunt. Cognition and memory are normal.     Assessment/Plan Patient has shown significant improvement and the plan will be for discharge on Saturday with follow-up treatment next week  Alethia Berthold, MD 12/25/2017, 9:28 AM

## 2017-12-25 NOTE — Progress Notes (Signed)
Recreation Therapy Notes  Date: 12/25/2017  Time: 9:30 am   Location: Craft Room   Behavioral response: N/A   Intervention Topic:  Team Work  Discussion/Intervention: Patient did not attend group.   Clinical Observations/Feedback:  Patient did not attend group.   Elisabetta Mishra LRT/CTRS        Roger Kettles 12/25/2017 10:23 AM

## 2017-12-25 NOTE — Progress Notes (Signed)
  [facility] Adult Case Management Discharge Plan :  Will you be returning to the same living situation after discharge:  Yes,  returning home At discharge, do you have transportation home?: Yes,  family Do you have the ability to pay for your medications: Yes,  insurance  Release of information consent forms completed and in the chart;  Patient's signature needed at discharge.  Patient to Follow up at: Follow-up Information    [facility]. Call.   Why:  Your ECT nurse will contact you to schedule your next ECT follow up appointment. Should you have any questions, please contact the ECT department. Thank you! Contact information: [place] [phone] ar [place] [phone]          Next level of care provider has access to [place] and Suicide Prevention discussed: Yes,  with pt. Pt declined to involve additional supports in his tx.   Have you used any form of tobacco in the last 30 days? (Cigarettes, Smokeless Tobacco, Cigars, and/or Pipes): No  Has patient been referred to the Quitline?: N/A patient is not a smoker  Patient has been referred for addiction treatment: N/A  Alden Hipp, LCSW 12/25/2017, 8:55 AM

## 2017-12-25 NOTE — Progress Notes (Signed)
Patient is pleasant and preparing for ECT treatment today. Patient denies SI/HI/AVH. Patient is calm and cooperative. Patient reports being nervous about procedures. Patient is also pending D/C later today.

## 2017-12-25 NOTE — Discharge Summary (Signed)
Physician Discharge Summary Note  Patient:  Marc Long is an 42 y.o., male MRN:  [id number] DOB:  08/17/1975 Patient phone:  [phone] (home)  Patient address:   [place] [address],  Total Time spent with patient: 20 minutes plus 15 min on care coordination and documentation.  Date of Admission:  12/16/2017 Date of Discharge: 12/25/2017  Reason for Admission:  Depression.  History of Present Illness: 42 year old man admitted in transfer from behavioral health Hospital for ECT treatment.  Patient was admitted to the hospital there after presenting with diabetic ketoacidosis.  Patient says he had stopped taking all of his diabetes medicines for about a month with the hope that he would die.  He says he was doing this because he thought it would be easier on his family that if he were to do something more active such as shooting himself.  He says he is continuing to have some suicidal thought.  He has felt depressed for the last couple years but it has been worse since January.  Hopeless a lot of the time.  Energy level low.  Very negative about himself.  He describes himself now as having "images" as being his chief complaint.  He says he frequently during the day gets these intrusive thoughts about doing something violent to himself such as stabbing himself although he does not have any specific intention of acting on it.  They are distressing to him and cause anxiety.  Some difficulty sleeping.  Currently having normal appetite currently cooperative with medicine.  No other report of any hallucinations.  Not abusing any drugs or alcohol currently or before inpatient treatment.  Currently on medications for mood stabilization and depression.  Had not been getting better at the hospital in [facility] prompting a consideration of electroconvulsive therapy.  Associated Signs/Symptoms: Depression Symptoms:  depressed mood, feelings of worthlessness/guilt,  hopelessness, (Hypo) Manic Symptoms:  Irritable Mood, Anxiety Symptoms:  Agoraphobia, Excessive Worry, Psychotic Symptoms:  Nothing that I would call psychotic PTSD Symptoms: Negative  Past Psychiatric History: No previous psychiatric hospitalizations.  Had seen outpatient psychiatrist and therapist in the community.  Prior medicines include lithium Lamictal Ativan Abilify Zoloft Paxil risk Toradol.  He found the antidepressants and antipsychotics especially the current Seroquel somewhat helpful.  Patient has no history of more active suicide attempts.  No history of violence.  He does give a history of long-standing anxiety about intrusive violent sexual fantasies although he has no intention currently of acting on them.  Family Psychiatric  History: Family history positive for depression in his mother and brother  Social History:   Principal Problem: Severe recurrent major depression without psychotic features [facility]) Discharge Diagnoses: Patient Active Problem List   Diagnosis Date Noted  . Severe recurrent major depression without psychotic features ([place]) [F33.2] 12/16/2017    Priority: High  . MDD (major depressive disorder), recurrent episode, severe ([place]) [F33.2] 12/10/2017  . Obesity (BMI 30-39.9) [E66.9] 12/09/2017  . Depression with suicidal ideation [F32.9, R45.851]   . DKA (diabetic ketoacidoses) ([place]) [E13.10] 12/05/2017  . Hypertriglyceridemia [E78.1] 02/05/2017  . DM (diabetes mellitus), type 2, uncontrolled ([place]) [E11.65] 12/12/2016  . Dehydration [E86.0] 12/12/2016  . Suicidal ideations [R45.851] 12/12/2016  . Bipolar disorder (manic depression) ([place]) [F31.9] 12/12/2016  . Hyperglycemia due to type 2 diabetes mellitus ([place]) [E11.65] 12/12/2016  . AKI (acute kidney injury) ([place]) [N17.9] 12/12/2016  . DKA, type 2, not at goal [facility]) [E11.10] 12/12/2016  . Obesity, Class III, BMI 40-49.9 (  morbid obesity) ([place]) [E66.01]   . Type 2 diabetes mellitus without complication,  without long-term current use of insulin ([place]) [E11.9] 12/10/2016  . Lithium use [Z79.899] 12/10/2016  . Moderate single current episode of major depressive disorder ([place]) [F32.1] 04/15/2016  . Pain in joint, shoulder region [M25.519] 04/14/2016  . Diverticulosis of large intestine without hemorrhage [K57.30] 11/07/2015  . Calculus of gallbladder without cholecystitis [K80.20] 11/07/2015    Past Medical History:  Past Medical History:  Diagnosis Date  . Depression   . Diabetes mellitus without complication ([place])   . Gallstones   . Obesity, Class III, BMI 40-49.9 (morbid obesity) ([place])    History reviewed. No pertinent surgical history. Family History:  Family History  Problem Relation Age of Onset  . Hypertension Mother   . Depression Mother   . Heart attack Maternal Grandfather   . Lymphoma Maternal Grandmother   . Diabetes Maternal Aunt   . Cancer Maternal Aunt    Social History:  Social History   Substance and Sexual Activity  Alcohol Use Yes  . Alcohol/week: 0.0 oz   Comment: once every 2 weeks. Wine coolers     Social History   Substance and Sexual Activity  Drug Use No    Social History   Socioeconomic History  . Marital status: Single    Spouse name: Not on file  . Number of children: 0  . Years of education: Not on file  . Highest education level: Not on file  Occupational History  . Occupation: call center  Social Needs  . Financial resource strain: Not on file  . Food insecurity:    Worry: Not on file    Inability: Not on file  . Transportation needs:    Medical: Not on file    Non-medical: Not on file  Tobacco Use  . Smoking status: Never Smoker  . Smokeless tobacco: Never Used  Substance and Sexual Activity  . Alcohol use: Yes    Alcohol/week: 0.0 oz    Comment: once every 2 weeks. Wine coolers  . Drug use: No  . Sexual activity: Not Currently    Birth control/protection: None  Lifestyle  . Physical activity:    Days per week: Not on  file    Minutes per session: Not on file  . Stress: Not on file  Relationships  . Social connections:    Talks on phone: Not on file    Gets together: Not on file    Attends religious service: Not on file    Active member of club or organization: Not on file    Attends meetings of clubs or organizations: Not on file    Relationship status: Not on file  Other Topics Concern  . Not on file  Social History Narrative  . Not on file    Hospital Course:    Marc Long is a 42 year old male with a history of treatment resistant depression admitted for ECT treatment with improvement.  #Mood, improved -continue Prozac 40 mg daily -continue Buspar 10 mg TID -continue Seroquel 100 mg nightly -continue Trazodone 100 mg nightly PRN  #DM -Metformin 1000 mg BID  #Disposition -discharge to home -follow up with Dr. Weber Cooks in one week  Physical Findings: AIMS:  , ,  ,  ,    CIWA:    COWS:     Musculoskeletal: Strength & Muscle Tone: within normal limits Gait & Station: normal Patient leans: N/A  Psychiatric Specialty Exam: Physical Exam  Nursing note and vitals reviewed.  Psychiatric: He has a normal mood and affect. His speech is normal and behavior is normal. Judgment and thought content normal. Cognition and memory are normal.    Review of Systems  Neurological: Negative.   Psychiatric/Behavioral: Negative.   All other systems reviewed and are negative.   Blood pressure 110/72, pulse 84, temperature 98.6 F (37 C), temperature source Oral, resp. rate 20, height 5\' 11"  (1.803 m), weight 124.3 kg (274 lb), SpO2 98 %.Body mass index is 38.22 kg/m.  General Appearance: Casual  Eye Contact:  Good  Speech:  Clear and Coherent  Volume:  Normal  Mood:  Euthymic  Affect:  Appropriate  Thought Process:  Goal Directed and Descriptions of Associations: Intact  Orientation:  Full (Time, Place, and Person)  Thought Content:  WDL  Suicidal Thoughts:  No  Homicidal Thoughts:  No   Memory:  Immediate;   Fair Recent;   Fair Remote;   Fair  Judgement:  Fair  Insight:  Fair  Psychomotor Activity:  Normal  Concentration:  Concentration: Fair and Attention Span: Fair  Recall:  [employer] of Knowledge:  Fair  Language:  Fair  Akathisia:  No  Handed:  Right  AIMS (if indicated):     Assets:  Communication Skills Desire for Improvement Financial Resources/Insurance Housing Physical Health Resilience Social Support  ADL's:  Intact  Cognition:  WNL  Sleep:  Number of Hours: 6.3     Have you used any form of tobacco in the last 30 days? (Cigarettes, Smokeless Tobacco, Cigars, and/or Pipes): No  Has this patient used any form of tobacco in the last 30 days? (Cigarettes, Smokeless Tobacco, Cigars, and/or Pipes) Yes, No  Blood Alcohol level:  Lab Results  Component Value Date   ETH <10 40/34/7425    Metabolic Disorder Labs:  Lab Results  Component Value Date   HGBA1C 16.7 (H) 12/09/2017   MPG 432.59 12/09/2017   No results found for: PROLACTIN Lab Results  Component Value Date   CHOL 142 03/05/2017   TRIG 148 03/05/2017   HDL 27 (L) 03/05/2017   CHOLHDL 5.3 (H) 03/05/2017   LDLCALC 85 03/05/2017   [place] Comment 12/10/2016    See Psychiatric Specialty Exam and Suicide Risk Assessment completed by Attending Physician prior to discharge.  Discharge destination:  Home  Is patient on multiple antipsychotic therapies at discharge:  No   Has Patient had three or more failed trials of antipsychotic monotherapy by history:  No  Recommended Plan for Multiple Antipsychotic Therapies: NA  Discharge Instructions    Diet - low sodium heart healthy   Complete by:  As directed    Increase activity slowly   Complete by:  As directed      Allergies as of 12/26/2017      Reactions   Bee Venom Swelling   Keflex [cephalexin]    UPSET STOMACH   Relafen [nabumetone]    Blood in stool   Shellfish Allergy Nausea Only      Medication List    STOP  taking these medications   hydrocortisone cream 1 %   insulin aspart 100 UNIT/ML injection Commonly known as:  novoLOG   insulin glargine 100 UNIT/ML injection Commonly known as:  LANTUS   lamoTRIgine 25 MG tablet Commonly known as:  LAMICTAL     TAKE these medications     Indication  busPIRone 10 MG tablet Commonly known as:  BUSPAR Take 1 tablet (10 mg total) by mouth 3 (three) times daily.  Indication:  Anxiety Disorder   FLUoxetine 40 MG capsule Commonly known as:  PROZAC Take 1 capsule (40 mg total) by mouth daily.  Indication:  mood stability   hydrOXYzine 50 MG tablet Commonly known as:  ATARAX/VISTARIL Take 1 tablet (50 mg total) by mouth 3 (three) times daily as needed for anxiety.  Indication:  Feeling Anxious, Tension   metFORMIN 1000 MG tablet Commonly known as:  GLUCOPHAGE Take 1 tablet (1,000 mg total) by mouth 2 (two) times daily with a meal.  Indication:  Type 2 Diabetes   multivitamin with minerals Tabs tablet Take 1 tablet by mouth daily.  Indication:  Vitamin Deficiency   pantoprazole 40 MG tablet Commonly known as:  PROTONIX Take 1 tablet (40 mg total) by mouth daily. What changed:  when to take this  Indication:  Stomach Ulcer   polyethylene glycol packet Commonly known as:  MIRALAX / GLYCOLAX Take 17 g by mouth daily.  Indication:  Constipation   QUEtiapine 100 MG tablet Commonly known as:  SEROQUEL Take 1 tablet (100 mg total) by mouth at bedtime. What changed:    medication strength  how much to take  additional instructions  Indication:  Depressive Phase of Manic-Depression   traZODone 100 MG tablet Commonly known as:  DESYREL Take 1 tablet (100 mg total) by mouth at bedtime as needed for sleep. What changed:    medication strength  how much to take  when to take this  reasons to take this  Indication:  Trouble Sleeping      Follow-up Information    [facility]. Call.   Why:  Your ECT nurse will contact you  to schedule your next ECT follow up appointment. Should you have any questions, please contact the ECT department. Thank you! Contact information: [place] [phone] ar [place] [phone]          Follow-up recommendations:  Activity:  as tolerated Diet:  low sodium heart heathy ADA diet Other:  keep follow up appointments  Comments:    Signed: Orson Slick, MD 12/26/2017, 8:27 AM

## 2017-12-25 NOTE — Plan of Care (Signed)
Patient has improved in all areas and anticipating discharge in AM after treatment, no issues and no complain from patient, denies any SI/HI at this time no distress, 15 minute safety rounds in progress. Problem: Education: Goal: Knowledge of [place] [facility] Education information/materials will improve Outcome: Progressing Goal: Emotional status will improve Outcome: Progressing Goal: Mental status will improve Outcome: Progressing Goal: Verbalization of understanding the information provided will improve Outcome: Progressing   Problem: Coping: Goal: Coping ability will improve Outcome: Progressing   Problem: Self-Concept: Goal: Ability to disclose and discuss suicidal ideas will improve Outcome: Progressing   Problem: Activity: Goal: Interest or engagement in leisure activities will improve Outcome: Progressing Goal: Imbalance in normal sleep/wake cycle will improve Outcome: Progressing   Problem: Self-Concept: Goal: Will verbalize positive feelings about self Outcome: Progressing Goal: Level of anxiety will decrease Outcome: Progressing   Problem: Education: Goal: Knowledge of [place] [facility] Education information/materials will improve Outcome: Progressing Goal: Emotional status will improve Outcome: Progressing Goal: Mental status will improve Outcome: Progressing Goal: Verbalization of understanding the information provided will improve Outcome: Progressing   Problem: Activity: Goal: Interest or engagement in activities will improve Outcome: Progressing Goal: Sleeping patterns will improve Outcome: Progressing   Problem: Coping: Goal: Ability to verbalize frustrations and anger appropriately will improve Outcome: Progressing Goal: Ability to demonstrate self-control will improve Outcome: Progressing   Problem: Health Behavior/Discharge Planning: Goal: Identification of resources available to assist in meeting health care needs will improve Outcome:  Progressing Goal: Compliance with treatment plan for underlying cause of condition will improve Outcome: Progressing   Problem: Physical Regulation: Goal: Ability to maintain clinical measurements within normal limits will improve Outcome: Progressing   Problem: Safety: Goal: Periods of time without injury will increase Outcome: Progressing   Problem: Education: Goal: Utilization of techniques to improve thought processes will improve Outcome: Progressing Goal: Knowledge of the prescribed therapeutic regimen will improve Outcome: Progressing   Problem: Activity: Goal: Interest or engagement in leisure activities will improve Outcome: Progressing Goal: Imbalance in normal sleep/wake cycle will improve Outcome: Progressing   Problem: Coping: Goal: Coping ability will improve Outcome: Progressing Goal: Will verbalize feelings Outcome: Progressing   Problem: Health Behavior/Discharge Planning: Goal: Ability to make decisions will improve Outcome: Progressing Goal: Compliance with therapeutic regimen will improve Outcome: Progressing   Problem: Role Relationship: Goal: Will demonstrate positive changes in social behaviors and relationships Outcome: Progressing   Problem: Safety: Goal: Ability to disclose and discuss suicidal ideas will improve Outcome: Progressing Goal: Ability to identify and utilize support systems that promote safety will improve Outcome: Progressing   Problem: Self-Concept: Goal: Will verbalize positive feelings about self Outcome: Progressing Goal: Level of anxiety will decrease Outcome: Progressing   Problem: Education: Goal: Knowledge of General Education information will improve Outcome: Progressing   Problem: Clinical Measurements: Goal: Ability to maintain clinical measurements within normal limits will improve Outcome: Progressing

## 2017-12-25 NOTE — Progress Notes (Signed)
Patient has returned from ECT. Patient is oriented x4. Patient reports no pain at this time. Patient is pleasant and denies SI/HI/AVH.

## 2017-12-25 NOTE — Anesthesia Postprocedure Evaluation (Signed)
Anesthesia Post Note  Patient: Marc Long  Procedure(s) Performed: ECT TX  Patient location during evaluation: PACU Anesthesia Type: General Level of consciousness: awake and alert and oriented Pain management: pain level controlled Vital Signs Assessment: post-procedure vital signs reviewed and stable Respiratory status: spontaneous breathing Cardiovascular status: blood pressure returned to baseline Anesthetic complications: no     Last Vitals:  Vitals:   12/25/17 1110 12/25/17 1340  BP:  109/69  Pulse:  64  Resp:  16  Temp: 36.7 C 36.8 C  SpO2:      Last Pain:  Vitals:   12/25/17 1340  TempSrc: Oral  PainSc: 0-No pain                 Obie Silos

## 2017-12-25 NOTE — Transfer of Care (Signed)
Immediate Anesthesia Transfer of Care Note  Patient: Marc Long  Procedure(s) Performed: ECT TX  Patient Location: PACU  Anesthesia Type:General  Level of Consciousness: sedated  Airway & Oxygen Therapy: Patient Spontanous Breathing and Patient connected to face mask oxygen  Post-op Assessment: Report given to RN and Post -op Vital signs reviewed and stable  Post vital signs: Reviewed and stable  Last Vitals:  Vitals Value Taken Time  BP 135/91 12/25/2017 10:31 AM  Temp 37.2 C 12/25/2017 10:31 AM  Pulse 72 12/25/2017 10:32 AM  Resp 31 12/25/2017 10:32 AM  SpO2 93 % 12/25/2017 10:32 AM  Vitals shown include unvalidated device data.  Last Pain:  Vitals:   12/25/17 0944  TempSrc:   PainSc: 0-No pain      Patients Stated Pain Goal: 0 (55/83/16 7425)  Complications: No apparent anesthesia complications

## 2017-12-25 NOTE — BHH Suicide Risk Assessment (Signed)
[facility] Discharge Suicide Risk Assessment   Principal Problem: Severe recurrent major depression without psychotic features [facility]) Discharge Diagnoses:  Patient Active Problem List   Diagnosis Date Noted  . Severe recurrent major depression without psychotic features ([place]) [F33.2] 12/16/2017  . MDD (major depressive disorder), recurrent episode, severe ([place]) [F33.2] 12/10/2017  . Obesity (BMI 30-39.9) [E66.9] 12/09/2017  . Depression with suicidal ideation [F32.9, R45.851]   . DKA (diabetic ketoacidoses) ([place]) [E13.10] 12/05/2017  . Hypertriglyceridemia [E78.1] 02/05/2017  . DM (diabetes mellitus), type 2, uncontrolled ([place]) [E11.65] 12/12/2016  . Dehydration [E86.0] 12/12/2016  . Suicidal ideations [R45.851] 12/12/2016  . Bipolar disorder (manic depression) ([place]) [F31.9] 12/12/2016  . Hyperglycemia due to type 2 diabetes mellitus ([place]) [E11.65] 12/12/2016  . AKI (acute kidney injury) ([place]) [N17.9] 12/12/2016  . DKA, type 2, not at goal [facility]) [E11.10] 12/12/2016  . Obesity, Class III, BMI 40-49.9 (morbid obesity) ([place]) [E66.01]   . Type 2 diabetes mellitus without complication, without long-term current use of insulin ([place]) [E11.9] 12/10/2016  . Lithium use [Z79.899] 12/10/2016  . Moderate single current episode of major depressive disorder ([place]) [F32.1] 04/15/2016  . Pain in joint, shoulder region [M25.519] 04/14/2016  . Diverticulosis of large intestine without hemorrhage [K57.30] 11/07/2015  . Calculus of gallbladder without cholecystitis [K80.20] 11/07/2015    Total Time spent with patient: 45 minutes  Musculoskeletal: Strength & Muscle Tone: within normal limits Gait & Station: normal Patient leans: N/A  Psychiatric Specialty Exam: Review of Systems  Constitutional: Negative.   HENT: Negative.   Eyes: Negative.   Respiratory: Negative.   Cardiovascular: Negative.   Gastrointestinal: Negative.   Musculoskeletal: Negative.   Skin: Negative.   Neurological: Negative.    Psychiatric/Behavioral: Negative.     Blood pressure (!) 135/99, pulse 70, temperature 98.1 F (36.7 C), resp. rate 11, height 5\' 11"  (1.803 m), weight 124.3 kg (274 lb), SpO2 93 %.Body mass index is 38.22 kg/m.  General Appearance: Fairly Groomed  Engineer, water::  Good  Speech:  Normal Rate409  Volume:  Normal  Mood:  Euthymic  Affect:  Appropriate  Thought Process:  Coherent  Orientation:  Full (Time, Place, and Person)  Thought Content:  Logical  Suicidal Thoughts:  No  Homicidal Thoughts:  No  Memory:  Immediate;   Fair Recent;   Fair Remote;   Fair  Judgement:  Fair  Insight:  Fair  Psychomotor Activity:  Normal  Concentration:  Fair  Recall:  [employer] [place]  Language: Fair  Akathisia:  No  Handed:  Right  AIMS (if indicated):     Assets:  Desire for Improvement Housing Physical Health Social Support  Sleep:  Number of Hours: 6.3  Cognition: WNL  ADL's:  Intact   Mental Status Per Nursing Assessment::   On Admission:  Suicidal ideation indicated by patient, Self-harm thoughts  Demographic Factors:  Male and Unemployed  Loss Factors: Financial problems/change in socioeconomic status  Historical Factors: Prior suicide attempts  Risk Reduction Factors:   Living with another person, especially a relative, Positive social support and Positive therapeutic relationship  Continued Clinical Symptoms:  Bipolar Disorder:   Depressive phase  Cognitive Features That Contribute To Risk:  Closed-mindedness    Suicide Risk:  Minimal: No identifiable suicidal ideation.  Patients presenting with no risk factors but with morbid ruminations; may be classified as minimal risk based on the severity of the depressive symptoms  Follow-up Information    [facility]. Call.   Why:  Your ECT nurse  will contact you to schedule your next ECT follow up appointment. Should you have any questions, please contact the ECT department. Thank you! Contact  information: [place] [phone] [address] [place] [phone]          Plan Of Care/Follow-up recommendations:  Activity:  as tolerated Diet:  carb controlled Other:  follow up ECT 7/19  Alethia Berthold, MD 12/25/2017, 11:58 AM

## 2017-12-26 LAB — GLUCOSE, CAPILLARY: Glucose-Capillary: 100 mg/dL — ABNORMAL HIGH (ref 70–99)

## 2017-12-26 NOTE — Progress Notes (Signed)
Patient is stable without any side effects from ECT treatment, appear ready to be discharged , denies any SI/HI/AVH , safety is maintained no distress

## 2017-12-26 NOTE — Progress Notes (Signed)
[facility] MD Progress Note  12/26/2017 1:21 AM Marc Long  MRN:  [id number] Subjective: Follow-up for this 42 year old man with severe depression.  Patient had right unilateral ECT this morning which was completed without complication.  Patient reports his mood is improved.  He is no longer having the intrusive ugly visions that he used to have.  Feels more optimistic.  Not reporting any psychotic symptoms.  Minor mood and memory complaints at times.  No new physical complaints.  Blood sugars have been very stable. Principal Problem: Severe recurrent major depression without psychotic features ([place]) Diagnosis:   Patient Active Problem List   Diagnosis Date Noted  . Severe recurrent major depression without psychotic features ([place]) [F33.2] 12/16/2017  . MDD (major depressive disorder), recurrent episode, severe ([place]) [F33.2] 12/10/2017  . Obesity (BMI 30-39.9) [E66.9] 12/09/2017  . Depression with suicidal ideation [F32.9, R45.851]   . DKA (diabetic ketoacidoses) ([place]) [E13.10] 12/05/2017  . Hypertriglyceridemia [E78.1] 02/05/2017  . DM (diabetes mellitus), type 2, uncontrolled ([place]) [E11.65] 12/12/2016  . Dehydration [E86.0] 12/12/2016  . Suicidal ideations [R45.851] 12/12/2016  . Bipolar disorder (manic depression) ([place]) [F31.9] 12/12/2016  . Hyperglycemia due to type 2 diabetes mellitus ([place]) [E11.65] 12/12/2016  . AKI (acute kidney injury) ([place]) [N17.9] 12/12/2016  . DKA, type 2, not at goal [facility]) [E11.10] 12/12/2016  . Obesity, Class III, BMI 40-49.9 (morbid obesity) ([place]) [E66.01]   . Type 2 diabetes mellitus without complication, without long-term current use of insulin ([place]) [E11.9] 12/10/2016  . Lithium use [Z79.899] 12/10/2016  . Moderate single current episode of major depressive disorder ([place]) [F32.1] 04/15/2016  . Pain in joint, shoulder region [M25.519] 04/14/2016  . Diverticulosis of large intestine without hemorrhage [K57.30] 11/07/2015  . Calculus of gallbladder without  cholecystitis [K80.20] 11/07/2015   Total Time spent with patient: 30 minutes  Past Psychiatric History: Patient has a past history of depression with only partial response to medicine  Past Medical History:  Past Medical History:  Diagnosis Date  . Depression   . Diabetes mellitus without complication ([place])   . Gallstones   . Obesity, Class III, BMI 40-49.9 (morbid obesity) ([place])    History reviewed. No pertinent surgical history. Family History:  Family History  Problem Relation Age of Onset  . Hypertension Mother   . Depression Mother   . Heart attack Maternal Grandfather   . Lymphoma Maternal Grandmother   . Diabetes Maternal Aunt   . Cancer Maternal Aunt    Family Psychiatric  History: Unknown Social History:  Social History   Substance and Sexual Activity  Alcohol Use Yes  . Alcohol/week: 0.0 oz   Comment: once every 2 weeks. Wine coolers     Social History   Substance and Sexual Activity  Drug Use No    Social History   Socioeconomic History  . Marital status: Single    Spouse name: Not on file  . Number of children: 0  . Years of education: Not on file  . Highest education level: Not on file  Occupational History  . Occupation: call center  Social Needs  . Financial resource strain: Not on file  . Food insecurity:    Worry: Not on file    Inability: Not on file  . Transportation needs:    Medical: Not on file    Non-medical: Not on file  Tobacco Use  . Smoking status: Never Smoker  . Smokeless tobacco: Never Used  Substance and Sexual Activity  . Alcohol use: Yes  Alcohol/week: 0.0 oz    Comment: once every 2 weeks. Wine coolers  . Drug use: No  . Sexual activity: Not Currently    Birth control/protection: None  Lifestyle  . Physical activity:    Days per week: Not on file    Minutes per session: Not on file  . Stress: Not on file  Relationships  . Social connections:    Talks on phone: Not on file    Gets together: Not on file     Attends religious service: Not on file    Active member of club or organization: Not on file    Attends meetings of clubs or organizations: Not on file    Relationship status: Not on file  Other Topics Concern  . Not on file  Social History Narrative  . Not on file   Additional Social History:                         Sleep: Fair  Appetite:  Fair  Current Medications: Current Facility-Administered Medications  Medication Dose Route Frequency Provider Last Rate Last Dose  . busPIRone (BUSPAR) tablet 10 mg  10 mg Oral TID Varnika Butz, Madie Reno, MD   10 mg at 12/25/17 1610  . FLUoxetine (PROZAC) capsule 40 mg  40 mg Oral Daily Joshawa Dubin, Miner T, MD   40 mg at 12/25/17 1156  . hydrOXYzine (ATARAX/VISTARIL) tablet 50 mg  50 mg Oral TID PRN Abdoul Encinas, Madie Reno, MD   50 mg at 12/23/17 2112  . insulin aspart (novoLOG) injection 0-9 Units  0-9 Units Subcutaneous TID WC Gaylin Osoria, Madie Reno, MD   1 Units at 12/23/17 1635  . metFORMIN (GLUCOPHAGE) tablet 1,000 mg  1,000 mg Oral BID WC Shalanda Brogden, Madie Reno, MD   1,000 mg at 12/25/17 2107  . multivitamin with minerals tablet 1 tablet  1 tablet Oral Daily Kenni Newton, Madie Reno, MD   1 tablet at 12/25/17 1156  . pantoprazole (PROTONIX) EC tablet 40 mg  40 mg Oral Daily Ayesha Markwell, Madie Reno, MD   40 mg at 12/25/17 1156  . polyethylene glycol (MIRALAX / GLYCOLAX) packet 17 g  17 g Oral Daily Carsyn Boster, Madie Reno, MD   Stopped at 12/25/17 [phone]  . QUEtiapine (SEROQUEL) tablet 100 mg  100 mg Oral QHS Levern Kalka, Elias T, MD   100 mg at 12/25/17 2106  . traZODone (DESYREL) tablet 100 mg  100 mg Oral QHS PRN Akil Hoos, Madie Reno, MD   100 mg at 12/24/17 2126  . traZODone (DESYREL) tablet 50 mg  50 mg Oral QHS Sivan Quast, Madie Reno, MD   50 mg at 12/25/17 2106    Lab Results:  Results for orders placed or performed during the hospital encounter of 12/16/17 (from the past 48 hour(s))  Glucose, capillary     Status: Abnormal   Collection Time: 12/24/17  6:52 AM  Result Value Ref Range    Glucose-Capillary 113 (H) 70 - 99 mg/dL  Glucose, capillary     Status: Abnormal   Collection Time: 12/24/17 11:45 AM  Result Value Ref Range   Glucose-Capillary 103 (H) 70 - 99 mg/dL  Glucose, capillary     Status: Abnormal   Collection Time: 12/24/17  4:34 PM  Result Value Ref Range   Glucose-Capillary 109 (H) 70 - 99 mg/dL   Comment 1 Notify RN   Glucose, capillary     Status: Abnormal   Collection Time: 12/24/17  8:29 PM  Result Value Ref  Range   Glucose-Capillary 108 (H) 70 - 99 mg/dL   Comment 1 Notify RN   Glucose, capillary     Status: Abnormal   Collection Time: 12/25/17  7:09 AM  Result Value Ref Range   Glucose-Capillary 109 (H) 70 - 99 mg/dL   Comment 1 Notify RN   Glucose, capillary     Status: Abnormal   Collection Time: 12/25/17 10:40 AM  Result Value Ref Range   Glucose-Capillary 107 (H) 70 - 99 mg/dL  Glucose, capillary     Status: Abnormal   Collection Time: 12/25/17 11:50 AM  Result Value Ref Range   Glucose-Capillary 106 (H) 70 - 99 mg/dL  Glucose, capillary     Status: None   Collection Time: 12/25/17  4:03 PM  Result Value Ref Range   Glucose-Capillary 81 70 - 99 mg/dL  Glucose, capillary     Status: None   Collection Time: 12/25/17  9:18 PM  Result Value Ref Range   Glucose-Capillary 74 70 - 99 mg/dL    Blood Alcohol level:  Lab Results  Component Value Date   ETH <10 47/82/9562    Metabolic Disorder Labs: Lab Results  Component Value Date   HGBA1C 16.7 (H) 12/09/2017   MPG 432.59 12/09/2017   No results found for: PROLACTIN Lab Results  Component Value Date   CHOL 142 03/05/2017   TRIG 148 03/05/2017   HDL 27 (L) 03/05/2017   CHOLHDL 5.3 (H) 03/05/2017   LDLCALC 85 03/05/2017   [place] Comment 12/10/2016    Physical Findings: AIMS:  , ,  ,  ,    CIWA:    COWS:     Musculoskeletal: Strength & Muscle Tone: within normal limits Gait & Station: normal Patient leans: N/A  Psychiatric Specialty Exam: Physical Exam  Nursing  note and vitals reviewed. Constitutional: He appears well-developed and well-nourished.  HENT:  Head: Normocephalic and atraumatic.  Eyes: Pupils are equal, round, and reactive to light. Conjunctivae are normal.  Neck: Normal range of motion.  Cardiovascular: Regular rhythm and normal heart sounds.  Respiratory: Effort normal. No respiratory distress.  GI: Soft.  Musculoskeletal: Normal range of motion.  Neurological: He is alert.  Skin: Skin is warm and dry.  Psychiatric: He has a normal mood and affect. His speech is normal and behavior is normal. Judgment and thought content normal. Cognition and memory are normal.    Review of Systems  Constitutional: Negative.   HENT: Negative.   Eyes: Negative.   Respiratory: Negative.   Cardiovascular: Negative.   Gastrointestinal: Negative.   Musculoskeletal: Negative.   Skin: Negative.   Neurological: Negative.   Psychiatric/Behavioral: Negative.     Blood pressure 109/69, pulse 64, temperature 98.3 F (36.8 C), temperature source Oral, resp. rate 16, height 5\' 11"  (1.803 m), weight 124.3 kg (274 lb), SpO2 93 %.Body mass index is 38.22 kg/m.  General Appearance: Casual  Eye Contact:  Good  Speech:  Clear and Coherent  Volume:  Normal  Mood:  Euthymic  Affect:  Congruent  Thought Process:  Goal Directed  Orientation:  Full (Time, Place, and Person)  Thought Content:  Logical  Suicidal Thoughts:  No  Homicidal Thoughts:  No  Memory:  Immediate;   Fair Recent;   Fair Remote;   Fair  Judgement:  Fair  Insight:  Fair  Psychomotor Activity:  Normal  Concentration:  Concentration: Fair  Recall:  [place] of Knowledge:  Good  Language:  Good  Akathisia:  No  Handed:  Right  AIMS (if indicated):     Assets:  Desire for Improvement Housing Physical Health  ADL's:  Intact  Cognition:  WNL  Sleep:  Number of Hours: 6.3     Treatment Plan Summary: Daily contact with patient to assess and evaluate symptoms and progress in  treatment, Medication management and Plan Patient has tolerated the course of ECT without difficulty and is showing consistent mood improvement.  Reviewed treatment plan with patient and plan for discharge.  He will be discharged as of Saturday.  Medication orders and prescriptions are all in place.  I would like to see him back in 1 week for follow-up.  Patient agrees to the plan.  Alethia Berthold, MD 12/26/2017, 1:21 AM

## 2017-12-30 ENCOUNTER — Telehealth: Payer: Self-pay

## 2018-01-20 ENCOUNTER — Ambulatory Visit: Payer: Medicaid Other | Admitting: Family Medicine

## 2018-02-26 ENCOUNTER — Other Ambulatory Visit: Payer: Self-pay | Admitting: Psychiatry

## 2018-04-05 ENCOUNTER — Other Ambulatory Visit: Payer: Self-pay

## 2018-04-05 ENCOUNTER — Encounter (HOSPITAL_COMMUNITY): Payer: Self-pay | Admitting: Emergency Medicine

## 2018-04-05 DIAGNOSIS — F332 Major depressive disorder, recurrent severe without psychotic features: Secondary | ICD-10-CM | POA: Insufficient documentation

## 2018-04-05 DIAGNOSIS — Z7984 Long term (current) use of oral hypoglycemic drugs: Secondary | ICD-10-CM | POA: Insufficient documentation

## 2018-04-05 DIAGNOSIS — E119 Type 2 diabetes mellitus without complications: Secondary | ICD-10-CM | POA: Insufficient documentation

## 2018-04-05 DIAGNOSIS — R45851 Suicidal ideations: Secondary | ICD-10-CM | POA: Insufficient documentation

## 2018-04-05 DIAGNOSIS — Z79899 Other long term (current) drug therapy: Secondary | ICD-10-CM | POA: Insufficient documentation

## 2018-04-05 NOTE — ED Triage Notes (Signed)
Pt presents from home after ingesting appx 20 Vistaril, 6 Seroquel 100mg , 3 Trazodone 100mg , Melatonin at appx 2305  And then had induced vomiting there after. Pt currently alert and oriented x 4 at this time and reported overdose was intentional suicide attempt.

## 2018-04-06 ENCOUNTER — Emergency Department (HOSPITAL_COMMUNITY)
Admission: EM | Admit: 2018-04-06 | Discharge: 2018-04-06 | Disposition: A | Discharge status: Other Acute Inpatient Hospital | Payer: Medicaid Other | Attending: Emergency Medicine | Admitting: Emergency Medicine

## 2018-04-06 ENCOUNTER — Inpatient Hospital Stay (HOSPITAL_COMMUNITY)
Admission: AD | Admit: 2018-04-06 | Discharge: 2018-04-19 | DRG: 885 | Disposition: A | Discharge status: Home / Self Care | Payer: Medicaid Other | Source: Intra-hospital | Attending: Psychiatry | Admitting: Psychiatry

## 2018-04-06 ENCOUNTER — Encounter (HOSPITAL_COMMUNITY): Payer: Self-pay | Admitting: Emergency Medicine

## 2018-04-06 DIAGNOSIS — R45851 Suicidal ideations: Secondary | ICD-10-CM

## 2018-04-06 DIAGNOSIS — Z560 Unemployment, unspecified: Secondary | ICD-10-CM

## 2018-04-06 DIAGNOSIS — Z833 Family history of diabetes mellitus: Secondary | ICD-10-CM

## 2018-04-06 DIAGNOSIS — G4700 Insomnia, unspecified: Secondary | ICD-10-CM | POA: Diagnosis present

## 2018-04-06 DIAGNOSIS — E119 Type 2 diabetes mellitus without complications: Secondary | ICD-10-CM | POA: Diagnosis present

## 2018-04-06 DIAGNOSIS — Z818 Family history of other mental and behavioral disorders: Secondary | ICD-10-CM

## 2018-04-06 DIAGNOSIS — Z91013 Allergy to seafood: Secondary | ICD-10-CM | POA: Diagnosis not present

## 2018-04-06 DIAGNOSIS — T43592A Poisoning by other antipsychotics and neuroleptics, intentional self-harm, initial encounter: Secondary | ICD-10-CM | POA: Diagnosis not present

## 2018-04-06 DIAGNOSIS — F329 Major depressive disorder, single episode, unspecified: Secondary | ICD-10-CM | POA: Diagnosis not present

## 2018-04-06 DIAGNOSIS — Z79899 Other long term (current) drug therapy: Secondary | ICD-10-CM | POA: Diagnosis not present

## 2018-04-06 DIAGNOSIS — Z91030 Bee allergy status: Secondary | ICD-10-CM | POA: Diagnosis not present

## 2018-04-06 DIAGNOSIS — F419 Anxiety disorder, unspecified: Secondary | ICD-10-CM | POA: Diagnosis present

## 2018-04-06 DIAGNOSIS — Z807 Family history of other malignant neoplasms of lymphoid, hematopoietic and related tissues: Secondary | ICD-10-CM

## 2018-04-06 DIAGNOSIS — F39 Unspecified mood [affective] disorder: Secondary | ICD-10-CM | POA: Diagnosis not present

## 2018-04-06 DIAGNOSIS — T1491XA Suicide attempt, initial encounter: Secondary | ICD-10-CM | POA: Diagnosis not present

## 2018-04-06 DIAGNOSIS — F209 Schizophrenia, unspecified: Secondary | ICD-10-CM | POA: Diagnosis present

## 2018-04-06 DIAGNOSIS — Z598 Other problems related to housing and economic circumstances: Secondary | ICD-10-CM | POA: Diagnosis not present

## 2018-04-06 DIAGNOSIS — Z8249 Family history of ischemic heart disease and other diseases of the circulatory system: Secondary | ICD-10-CM

## 2018-04-06 DIAGNOSIS — F314 Bipolar disorder, current episode depressed, severe, without psychotic features: Principal | ICD-10-CM

## 2018-04-06 DIAGNOSIS — T50902A Poisoning by unspecified drugs, medicaments and biological substances, intentional self-harm, initial encounter: Secondary | ICD-10-CM

## 2018-04-06 DIAGNOSIS — Z888 Allergy status to other drugs, medicaments and biological substances status: Secondary | ICD-10-CM

## 2018-04-06 DIAGNOSIS — Z23 Encounter for immunization: Secondary | ICD-10-CM | POA: Diagnosis not present

## 2018-04-06 DIAGNOSIS — Z7984 Long term (current) use of oral hypoglycemic drugs: Secondary | ICD-10-CM | POA: Diagnosis not present

## 2018-04-06 DIAGNOSIS — Z8719 Personal history of other diseases of the digestive system: Secondary | ICD-10-CM | POA: Diagnosis not present

## 2018-04-06 DIAGNOSIS — Z881 Allergy status to other antibiotic agents status: Secondary | ICD-10-CM

## 2018-04-06 DIAGNOSIS — T43212A Poisoning by selective serotonin and norepinephrine reuptake inhibitors, intentional self-harm, initial encounter: Secondary | ICD-10-CM | POA: Diagnosis not present

## 2018-04-06 DIAGNOSIS — Z915 Personal history of self-harm: Secondary | ICD-10-CM

## 2018-04-06 LAB — COMPREHENSIVE METABOLIC PANEL
ALBUMIN: 4.2 g/dL (ref 3.5–5.0)
ALK PHOS: 54 U/L (ref 38–126)
ALT: 23 U/L (ref 0–44)
AST: 20 U/L (ref 15–41)
Anion gap: 7 (ref 5–15)
BILIRUBIN TOTAL: 0.3 mg/dL (ref 0.3–1.2)
BUN: 13 mg/dL (ref 6–20)
CHLORIDE: 108 mmol/L (ref 98–111)
CO2: 25 mmol/L (ref 22–32)
CREATININE: 1.12 mg/dL (ref 0.61–1.24)
Calcium: 9.1 mg/dL (ref 8.9–10.3)
GFR calc Af Amer: >60 mL/min (ref >60)
Glucose, Bld: 119 mg/dL — ABNORMAL HIGH (ref 70–99)
Potassium: 3.7 mmol/L (ref 3.5–5.1)
Sodium: 140 mmol/L (ref 135–145)
Total Protein: 7.3 g/dL (ref 6.5–8.1)

## 2018-04-06 LAB — RAPID URINE DRUG SCREEN, HOSP PERFORMED
Amphetamines: NOT DETECTED
BARBITURATES: NOT DETECTED
BENZODIAZEPINES: NOT DETECTED
Cocaine: NOT DETECTED
Opiates: NOT DETECTED
Tetrahydrocannabinol: NOT DETECTED

## 2018-04-06 LAB — ETHANOL

## 2018-04-06 LAB — CBC
HEMATOCRIT: 42.5 % (ref 39.0–52.0)
Hemoglobin: 13.7 g/dL (ref 13.0–17.0)
MCH: 28.4 pg (ref 26.0–34.0)
MCHC: 32.2 g/dL (ref 30.0–36.0)
MCV: 88 fL (ref 80.0–100.0)
NRBC: 0 % (ref 0.0–0.2)
Platelets: 303 10*3/uL (ref 150–400)
RBC: 4.83 MIL/uL (ref 4.22–5.81)
RDW: 13.2 % (ref 11.5–15.5)
WBC: 6.4 10*3/uL (ref 4.0–10.5)

## 2018-04-06 LAB — CBG MONITORING, ED: GLUCOSE-CAPILLARY: 115 mg/dL — AB (ref 70–99)

## 2018-04-06 LAB — SALICYLATE LEVEL: Salicylate Lvl: <7 mg/dL (ref 2.8–30.0)

## 2018-04-06 LAB — ACETAMINOPHEN LEVEL: Acetaminophen (Tylenol), Serum: <10 ug/mL — ABNORMAL LOW (ref 10–30)

## 2018-04-06 MED ORDER — BUSPIRONE HCL 10 MG PO TABS
10.0000 mg | ORAL_TABLET | Freq: Three times a day (TID) | ORAL | Status: DC
  Administered 2018-04-07 – 2018-04-08 (×5): 10 mg via ORAL
  Filled 2018-04-06 (×10): qty 1

## 2018-04-06 MED ORDER — POLYETHYLENE GLYCOL 3350 17 G PO PACK
17.0000 g | PACK | Freq: Every day | ORAL | Status: DC
  Filled 2018-04-06: qty 1

## 2018-04-06 MED ORDER — ADULT MULTIVITAMIN W/MINERALS CH
1.0000 | ORAL_TABLET | Freq: Every day | ORAL | Status: DC
  Administered 2018-04-07 – 2018-04-19 (×13): 1 via ORAL
  Filled 2018-04-06 (×16): qty 1

## 2018-04-06 MED ORDER — TRAZODONE HCL 100 MG PO TABS: 100.0000 mg | ORAL_TABLET | Freq: Every evening | ORAL | Status: DC | PRN

## 2018-04-06 MED ORDER — TRAZODONE HCL 100 MG PO TABS
100.0000 mg | ORAL_TABLET | Freq: Every evening | ORAL | Status: DC | PRN
  Administered 2018-04-06 – 2018-04-08 (×3): 100 mg via ORAL
  Filled 2018-04-06 (×3): qty 1

## 2018-04-06 MED ORDER — ONDANSETRON HCL 4 MG PO TABS: 4.0000 mg | ORAL_TABLET | Freq: Three times a day (TID) | ORAL | Status: DC | PRN

## 2018-04-06 MED ORDER — POLYETHYLENE GLYCOL 3350 17 G PO PACK
17.0000 g | PACK | Freq: Every day | ORAL | Status: DC
  Filled 2018-04-06 (×2): qty 1

## 2018-04-06 MED ORDER — ACETAMINOPHEN 325 MG PO TABS: 650.0000 mg | ORAL_TABLET | ORAL | Status: DC | PRN

## 2018-04-06 MED ORDER — RISPERIDONE 1 MG PO TBDP: 2.0000 mg | ORAL_TABLET | Freq: Three times a day (TID) | ORAL | Status: DC | PRN

## 2018-04-06 MED ORDER — RISPERIDONE 2 MG PO TBDP
2.0000 mg | ORAL_TABLET | Freq: Three times a day (TID) | ORAL | Status: DC | PRN
  Administered 2018-04-08 – 2018-04-15 (×3): 2 mg via ORAL
  Filled 2018-04-06 (×3): qty 1

## 2018-04-06 MED ORDER — ZIPRASIDONE MESYLATE 20 MG IM SOLR: 20.0000 mg | INTRAMUSCULAR | Status: DC | PRN

## 2018-04-06 MED ORDER — PANTOPRAZOLE SODIUM 40 MG PO TBEC
40.0000 mg | DELAYED_RELEASE_TABLET | Freq: Every day | ORAL | Status: DC
  Administered 2018-04-06: 40 mg via ORAL
  Filled 2018-04-06: qty 1

## 2018-04-06 MED ORDER — METFORMIN HCL 500 MG PO TABS
1000.0000 mg | ORAL_TABLET | Freq: Two times a day (BID) | ORAL | Status: DC
  Administered 2018-04-07 – 2018-04-19 (×25): 1000 mg via ORAL
  Filled 2018-04-06 (×29): qty 2

## 2018-04-06 MED ORDER — INFLUENZA VAC SPLIT QUAD 0.5 ML IM SUSY
0.5000 mL | PREFILLED_SYRINGE | INTRAMUSCULAR | Status: AC
  Administered 2018-04-07: 0.5 mL via INTRAMUSCULAR
  Filled 2018-04-06: qty 0.5

## 2018-04-06 MED ORDER — FLUOXETINE HCL 20 MG PO CAPS
40.0000 mg | ORAL_CAPSULE | Freq: Every day | ORAL | Status: DC
  Administered 2018-04-06: 40 mg via ORAL
  Filled 2018-04-06: qty 2

## 2018-04-06 MED ORDER — LORAZEPAM 1 MG PO TABS
1.0000 mg | ORAL_TABLET | ORAL | Status: AC | PRN
  Administered 2018-04-08: 1 mg via ORAL
  Filled 2018-04-06: qty 1

## 2018-04-06 MED ORDER — MAGNESIUM HYDROXIDE 400 MG/5ML PO SUSP: 30.0000 mL | Freq: Every day | ORAL | Status: DC | PRN

## 2018-04-06 MED ORDER — ACETAMINOPHEN 325 MG PO TABS
650.0000 mg | ORAL_TABLET | ORAL | Status: DC | PRN
  Administered 2018-04-15: 650 mg via ORAL
  Filled 2018-04-06: qty 2

## 2018-04-06 MED ORDER — PANTOPRAZOLE SODIUM 40 MG PO TBEC
40.0000 mg | DELAYED_RELEASE_TABLET | Freq: Every day | ORAL | Status: DC
  Administered 2018-04-07 – 2018-04-19 (×13): 40 mg via ORAL
  Filled 2018-04-06 (×15): qty 1

## 2018-04-06 MED ORDER — BUSPIRONE HCL 10 MG PO TABS
10.0000 mg | ORAL_TABLET | Freq: Three times a day (TID) | ORAL | Status: DC
  Administered 2018-04-06 (×2): 10 mg via ORAL
  Filled 2018-04-06 (×2): qty 1

## 2018-04-06 MED ORDER — QUETIAPINE FUMARATE 100 MG PO TABS
100.0000 mg | ORAL_TABLET | Freq: Every day | ORAL | Status: DC
  Administered 2018-04-06: 100 mg via ORAL
  Filled 2018-04-06 (×3): qty 1

## 2018-04-06 MED ORDER — FLUOXETINE HCL 20 MG PO CAPS
40.0000 mg | ORAL_CAPSULE | Freq: Every day | ORAL | Status: DC
  Administered 2018-04-07: 40 mg via ORAL
  Filled 2018-04-06 (×2): qty 2

## 2018-04-06 MED ORDER — LORAZEPAM 1 MG PO TABS: 1.0000 mg | ORAL_TABLET | ORAL | Status: DC | PRN

## 2018-04-06 MED ORDER — ALUM & MAG HYDROXIDE-SIMETH 200-200-20 MG/5ML PO SUSP: 30.0000 mL | ORAL | Status: DC | PRN

## 2018-04-06 MED ORDER — METFORMIN HCL 500 MG PO TABS
1000.0000 mg | ORAL_TABLET | Freq: Two times a day (BID) | ORAL | Status: DC
  Administered 2018-04-06 (×2): 1000 mg via ORAL
  Filled 2018-04-06 (×2): qty 2

## 2018-04-06 MED ORDER — QUETIAPINE FUMARATE 100 MG PO TABS: 100.0000 mg | ORAL_TABLET | Freq: Every day | ORAL | Status: DC

## 2018-04-06 NOTE — Progress Notes (Signed)
Nurse and Dr. Kathrynn Humble informed of pt disposition.

## 2018-04-06 NOTE — ED Notes (Signed)
Patient sleeping soundly before waking him to give medications.  Calm and cooperative.

## 2018-04-06 NOTE — ED Notes (Signed)
Bed: WA29 Expected date:  Expected time:  Means of arrival:  Comments: Marc Long

## 2018-04-06 NOTE — ED Notes (Signed)
Report called to [facility], Thomos Lemons.

## 2018-04-06 NOTE — ED Notes (Signed)
Patient dressed out and belongings placed behind nurses station; security called to wand patient to be moved to [place].

## 2018-04-06 NOTE — ED Provider Notes (Signed)
[facility] DEPT Provider Note   CSN: [id number] Arrival date & time: 04/05/18  2344     History   Chief Complaint Chief Complaint  Patient presents with  . Drug Overdose    HPI [name] is a 42 y.o. male.  HPI  42 year old male with history of diabetes, depression comes in with chief complaint of suicidal ideation and overdose. According to the patient he took approximately 21 Vistaril, 6 Seroquel, 3 trazodone and 1 tablet of melatonin all at 11 PM.  Reason for taking the overdose was to commit suicide.  Patient then spoke with his mother within minutes and had regretted his decision, therefore he called his mother and asked her to bring him into the ER.  Patient also states that last week he took several Vistaril's over a small period of time as an experiment.  Patient has history of SI in the past.  He reports that his suicidal ideation stems from his depression and also with him fantasizing about killing himself.  He has been compliant with his medications.  Past Medical History:  Diagnosis Date  . Depression   . Diabetes mellitus without complication ([place])   . Gallstones   . Obesity, Class III, BMI 40-49.9 (morbid obesity) ([place])     Patient Active Problem List   Diagnosis Date Noted  . Severe recurrent major depression without psychotic features ([place]) 12/16/2017  . MDD (major depressive disorder), recurrent episode, severe ([place]) 12/10/2017  . Obesity (BMI 30-39.9) 12/09/2017  . Depression with suicidal ideation   . DKA (diabetic ketoacidoses) ([place]) 12/05/2017  . Hypertriglyceridemia 02/05/2017  . DM (diabetes mellitus), type 2, uncontrolled ([place]) 12/12/2016  . Dehydration 12/12/2016  . Suicidal ideations 12/12/2016  . Bipolar disorder (manic depression) ([place]) 12/12/2016  . Hyperglycemia due to type 2 diabetes mellitus ([place]) 12/12/2016  . AKI (acute kidney injury) ([place]) 12/12/2016  . DKA, type 2, not at goal [facility])  12/12/2016  . Obesity, Class III, BMI 40-49.9 (morbid obesity) ([place])   . Type 2 diabetes mellitus without complication, without long-term current use of insulin ([place]) 12/10/2016  . Lithium use 12/10/2016  . Moderate single current episode of major depressive disorder ([place]) 04/15/2016  . Pain in joint, shoulder region 04/14/2016  . Diverticulosis of large intestine without hemorrhage 11/07/2015  . Calculus of gallbladder without cholecystitis 11/07/2015    History reviewed. No pertinent surgical history.      Home Medications    Prior to Admission medications   Medication Sig Start Date End Date Taking? Authorizing Provider  busPIRone (BUSPAR) 10 MG tablet Take 1 tablet (10 mg total) by mouth 3 (three) times daily. 12/25/17  Yes Pucilowska, Jolanta B, MD  FLUoxetine (PROZAC) 40 MG capsule Take 1 capsule (40 mg total) by mouth daily. 12/25/17  Yes Pucilowska, Jolanta B, MD  hydrOXYzine (ATARAX/VISTARIL) 50 MG tablet Take 1 tablet (50 mg total) by mouth 3 (three) times daily as needed for anxiety. Patient taking differently: Take 50 mg by mouth once.  12/25/17  Yes Clapacs, Madie Reno, MD  metFORMIN (GLUCOPHAGE) 1000 MG tablet Take 1 tablet (1,000 mg total) by mouth 2 (two) times daily with a meal. 12/25/17  Yes Clapacs, Madie Reno, MD  Multiple Vitamin (MULTIVITAMIN WITH MINERALS) TABS tablet Take 1 tablet by mouth daily. 12/26/17  Yes Clapacs, Madie Reno, MD  pantoprazole (PROTONIX) 40 MG tablet Take 1 tablet (40 mg total) by mouth daily. 12/26/17  Yes Clapacs, Madie Reno, MD  QUEtiapine (SEROQUEL) 100 MG tablet  Take 1 tablet (100 mg total) by mouth at bedtime. 12/25/17  Yes Clapacs, Madie Reno, MD  traZODone (DESYREL) 100 MG tablet Take 1 tablet (100 mg total) by mouth at bedtime as needed for sleep. 12/25/17  Yes Clapacs, Madie Reno, MD  polyethylene glycol (MIRALAX / GLYCOLAX) packet Take 17 g by mouth daily. Patient not taking: Reported on 04/06/2018 12/26/17   Clapacs, Madie Reno, MD    Family History Family History   Problem Relation Age of Onset  . Hypertension Mother   . Depression Mother   . Heart attack Maternal Grandfather   . Lymphoma Maternal Grandmother   . Diabetes Maternal Aunt   . Cancer Maternal Aunt     Social History Social History   Tobacco Use  . Smoking status: Never Smoker  . Smokeless tobacco: Never Used  Substance Use Topics  . Alcohol use: Yes    Alcohol/week: 0.0 standard drinks    Comment: once every 2 weeks. Wine coolers  . Drug use: No     Allergies   Bee venom; Keflex [cephalexin]; Relafen [nabumetone]; and Shellfish allergy   Review of Systems Review of Systems  Constitutional: Positive for activity change.  Respiratory: Negative for shortness of breath.   Cardiovascular: Negative for chest pain and palpitations.  Gastrointestinal: Negative for nausea and vomiting.  All other systems reviewed and are negative.    Physical Exam Updated Vital Signs BP 123/77   Pulse 66   Temp 98 F (36.7 C) (Oral)   Resp 17   Ht 6' (1.829 m)   Wt 117.9 kg   SpO2 95%   BMI 35.26 kg/m   Physical Exam  Constitutional: He is oriented to person, place, and time. He appears well-developed.  HENT:  Head: Atraumatic.  Eyes: Pupils are equal, round, and reactive to light. EOM are normal.  Neck: Neck supple.  Cardiovascular: Normal rate.  Pulmonary/Chest: Effort normal.  Neurological: He is alert and oriented to person, place, and time.  Skin: Skin is warm.  Psychiatric: He has a normal mood and affect.  Poor judgment  Nursing note and vitals reviewed.    ED Treatments / Results  Labs (all labs ordered are listed, but only abnormal results are displayed) Labs Reviewed  COMPREHENSIVE METABOLIC PANEL - Abnormal; Notable for the following components:      Result Value   Glucose, Bld 119 (*)    All other components within normal limits  ACETAMINOPHEN LEVEL - Abnormal; Notable for the following components:   Acetaminophen (Tylenol), Serum <10 (*)    All other  components within normal limits  CBG MONITORING, ED - Abnormal; Notable for the following components:   Glucose-Capillary 115 (*)    All other components within normal limits  ETHANOL  SALICYLATE LEVEL  CBC  RAPID URINE DRUG SCREEN, HOSP PERFORMED    EKG EKG Interpretation  Date/Time:  Tuesday April 06 2018 00:05:54 EDT Ventricular Rate:  74 PR Interval:    QRS Duration: 96 QT Interval:  420 QTC Calculation: 466 R Axis:   1 Text Interpretation:  Sinus rhythm Left ventricular hypertrophy No acute changes No old tracing to compare Confirmed by Varney Biles [phone]) on 04/06/2018 12:11:12 AM   Radiology No results found.  Procedures Procedures (including critical care time)  Medications Ordered in ED Medications  risperiDONE (RISPERDAL M-TABS) disintegrating tablet 2 mg (has no administration in time range)    And  LORazepam (ATIVAN) tablet 1 mg (has no administration in time range)    And  ziprasidone (GEODON) injection 20 mg (has no administration in time range)  acetaminophen (TYLENOL) tablet 650 mg (has no administration in time range)  ondansetron (ZOFRAN) tablet 4 mg (has no administration in time range)  polyethylene glycol (MIRALAX / GLYCOLAX) packet 17 g (has no administration in time range)  busPIRone (BUSPAR) tablet 10 mg (has no administration in time range)  FLUoxetine (PROZAC) capsule 40 mg (has no administration in time range)  metFORMIN (GLUCOPHAGE) tablet 1,000 mg (has no administration in time range)  pantoprazole (PROTONIX) EC tablet 40 mg (has no administration in time range)  QUEtiapine (SEROQUEL) tablet 100 mg (has no administration in time range)  traZODone (DESYREL) tablet 100 mg (has no administration in time range)     Initial Impression / Assessment and Plan / ED Course  I have reviewed the triage vital signs and the nursing notes.  Pertinent labs & imaging results that were available during my care of the patient were reviewed by me and  considered in my medical decision making (see chart for details).     42 year old male with history of depression and diabetes comes in with chief complaint of overdose. Patient overdosed on multiple medication prior to ED arrival, and then regret his decision.  He induced emesis himself at home and states that he saw some pills and powders in his vomitus.  He is hemodynamically stable at this time.  EKG does not show any acute findings.  Will observe patient in the ER for 6 hours, if he continues to be stable then we will medically clear him.  4:39 AM Patient is medically cleared for psych eval  Final Clinical Impressions(s) / ED Diagnoses   Final diagnoses:  Intentional drug overdose, initial encounter [facility])  Suicidal ideation    ED Discharge Orders    None       Varney Biles, MD 04/06/18 [phone]

## 2018-04-06 NOTE — ED Notes (Signed)
Pt being transferred to room 42 in [facility].
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
## 2018-04-06 NOTE — ED Notes (Signed)
Patient ambulatory with steady gait.

## 2018-04-06 NOTE — ED Notes (Signed)
Nurse went into patient's room to assess patient and patient said he felt like punching his hand which he reported doing  often at home.  Then he said he was having visions of stabbing himself.  Patient has been calm this morning in TCU, taking his po medications without difficulty and being very polite to nurse.  His mother is now here to see him and he says he wants to see her.  Registration notified.

## 2018-04-06 NOTE — Tx Team (Signed)
Initial Treatment Plan 04/06/2018 7:15 PM [name] Mayjor Ager VAC:[id number]    PATIENT STRESSORS: Medication change or noncompliance   PATIENT STRENGTHS: Ability for insight Communication skills Supportive family/friends   PATIENT IDENTIFIED PROBLEMS: "Bad Thoughts"  Self harm by cutting                   DISCHARGE CRITERIA:  Reduction of life-threatening or endangering symptoms to within safe limits Verbal commitment to aftercare and medication compliance  PRELIMINARY DISCHARGE PLAN: Outpatient therapy Return to previous living arrangement  PATIENT/FAMILY INVOLVEMENT: This treatment plan has been presented to and reviewed with the patient, Marc Long, and/or family member.  The patient and family have been given the opportunity to ask questions and make suggestions.  Harriet Masson, RN 04/06/2018, 7:15 PM

## 2018-04-06 NOTE — ED Notes (Signed)
[facility] to [facility] at [facility]

## 2018-04-06 NOTE — Progress Notes (Signed)
Patient ID: Marc Long, male   DOB: [birth year], 42 y.o.   MRN: [id number]  Pt  Is a 42 y/o single male, diagnosed with MDD. Presented to the ED voluntarily, reporting an overdose on 21 Vistaril tablet, 6 Seroquel tablets, 3 Trazodone tablets, and 1 Melatonin tablet. Pt reported that he took the pills, then got a call from his mother and discussed the plans he had for the next day. Pt reported that after the phone conversation with his mother, he made himself throw up, "I started thinking about the plans I had for the next day, so I called my mother to take me to the hospital."   Pt receives medication management at [facility], and receives therapy. Pt has a history of diabetes, gallstones, and obesity. Pt denies SI/HI, A/VH, and any pain or concerns. q15 minute checks initiated. Pt remains safe on the unit.

## 2018-04-06 NOTE — ED Notes (Signed)
[facility] called for transport. 

## 2018-04-07 DIAGNOSIS — T43592A Poisoning by other antipsychotics and neuroleptics, intentional self-harm, initial encounter: Secondary | ICD-10-CM

## 2018-04-07 DIAGNOSIS — F314 Bipolar disorder, current episode depressed, severe, without psychotic features: Principal | ICD-10-CM

## 2018-04-07 DIAGNOSIS — T43212A Poisoning by selective serotonin and norepinephrine reuptake inhibitors, intentional self-harm, initial encounter: Secondary | ICD-10-CM

## 2018-04-07 DIAGNOSIS — T1491XA Suicide attempt, initial encounter: Secondary | ICD-10-CM

## 2018-04-07 LAB — LIPID PANEL
CHOL/HDL RATIO: 5.5 ratio
Cholesterol: 172 mg/dL (ref 0–200)
HDL: 31 mg/dL — AB (ref >40)
LDL Cholesterol: 104 mg/dL — ABNORMAL HIGH (ref 0–99)
Triglycerides: 186 mg/dL — ABNORMAL HIGH (ref <150)
VLDL: 37 mg/dL (ref 0–40)

## 2018-04-07 LAB — TSH: TSH: 2.847 u[IU]/mL (ref 0.350–4.500)

## 2018-04-07 LAB — HEMOGLOBIN A1C
Hgb A1c MFr Bld: 5.6 % (ref 4.8–5.6)
Mean Plasma Glucose: 114.02 mg/dL

## 2018-04-07 MED ORDER — FLUVOXAMINE MALEATE 50 MG PO TABS
25.0000 mg | ORAL_TABLET | Freq: Once | ORAL | Status: AC
  Administered 2018-04-07: 25 mg via ORAL
  Filled 2018-04-07: qty 1

## 2018-04-07 MED ORDER — FLUOXETINE HCL 20 MG PO CAPS: 60.0000 mg | ORAL_CAPSULE | Freq: Every day | ORAL | Status: DC

## 2018-04-07 MED ORDER — FLUVOXAMINE MALEATE 50 MG PO TABS
50.0000 mg | ORAL_TABLET | Freq: Every day | ORAL | Status: DC
  Administered 2018-04-07: 50 mg via ORAL
  Filled 2018-04-07 (×3): qty 1

## 2018-04-07 MED ORDER — QUETIAPINE FUMARATE 200 MG PO TABS
200.0000 mg | ORAL_TABLET | Freq: Every day | ORAL | Status: DC
  Administered 2018-04-07 – 2018-04-09 (×3): 200 mg via ORAL
  Filled 2018-04-07 (×5): qty 1

## 2018-04-07 NOTE — BHH Suicide Risk Assessment (Signed)
[facility] Admission Suicide Risk Assessment   Nursing information obtained from:  Patient Demographic factors:  Male Current Mental Status:  Self-harm thoughts, Suicidal ideation indicated by patient Loss Factors:  Financial problems / change in socioeconomic status Historical Factors:  Prior suicide attempts Risk Reduction Factors:  Living with another person, especially a relative  Total Time spent with patient: 45 minutes Principal Problem: <principal problem not specified> Diagnosis:   Patient Active Problem List   Diagnosis Date Noted  . Schizophrenia ([place]) [F20.9] 04/06/2018  . Severe recurrent major depression without psychotic features ([place]) [F33.2] 12/16/2017  . MDD (major depressive disorder), recurrent episode, severe ([place]) [F33.2] 12/10/2017  . Obesity (BMI 30-39.9) [E66.9] 12/09/2017  . Depression with suicidal ideation [F32.9, R45.851]   . DKA (diabetic ketoacidoses) ([place]) [E11.10] 12/05/2017  . Hypertriglyceridemia [E78.1] 02/05/2017  . DM (diabetes mellitus), type 2, uncontrolled ([place]) [E11.65] 12/12/2016  . Dehydration [E86.0] 12/12/2016  . Suicidal ideations [R45.851] 12/12/2016  . Bipolar disorder (manic depression) ([place]) [F31.9] 12/12/2016  . Hyperglycemia due to type 2 diabetes mellitus ([place]) [E11.65] 12/12/2016  . AKI (acute kidney injury) ([place]) [N17.9] 12/12/2016  . DKA, type 2, not at goal [facility]) [E11.10] 12/12/2016  . Obesity, Class III, BMI 40-49.9 (morbid obesity) ([place]) [E66.01]   . Type 2 diabetes mellitus without complication, without long-term current use of insulin ([place]) [E11.9] 12/10/2016  . Lithium use [Z79.899] 12/10/2016  . Moderate single current episode of major depressive disorder ([place]) [F32.1] 04/15/2016  . Pain in joint, shoulder region [M25.519] 04/14/2016  . Diverticulosis of large intestine without hemorrhage [K57.30] 11/07/2015  . Calculus of gallbladder without cholecystitis [K80.20] 11/07/2015   Subjective Data: Patient is seen and examined.   Patient is a 42 year old male with a past psychiatric history significant for major depression versus bipolar disorder; depressed.  Patient presented to the [facility] emergency department voluntarily by way of his mother secondary to a reported overdose.  The patient stated he had ingested 21 Vistaril, 6 Seroquel, 3 trazodone, and 1 melatonin at approximately 11 PM.  Patient stated that after taking the pills he spoke to his mother, and admitted that he did it so she could bring him to the hospital.  He made himself throw up prior to coming to the hospital.  He stated that he was significantly depressed.  He admitted that he had previously attempted to harm himself in the past.  He denied any auditory, visual or tactile hallucinations.  No homicidal ideation.  He is followed by [facility] and has a therapist.  He was last hospitalized at [facility] on 12/10/2017.  He was hospitalized at that time because of suicidal ideation depression.  He was transferred to the [facility] for ECT.  He received 4 treatments at that time.  He was then discharged for outpatient treatment.  He stated that he is still currently depressed.  He stated that he went to [facility] earlier this week and they increased his fluoxetine to 60 mg p.o. daily.  He stated he was unable to continue outpatient ECT because of financial reasons.  He stated that he continues to have intrusive thoughts which makes him want to harm himself.  He also stated that these intrusive thoughts that started when he was a teenager when he felt as though he had to control thoughts and not to "rape people".  His intrusive thoughts now are to injure himself.  He stated that occasionally he will look for a pen or pencil to stab to his hand.  He sometimes goes in the bathroom and hits his hand violently to control these urges.  He denied any other compulsive or obsessive behaviors.  He was admitted to the hospital for evaluation and  stabilization.  Continued Clinical Symptoms:  Alcohol Use Disorder Identification Test Final Score (AUDIT): 2 The "Alcohol Use Disorders Identification Test", Guidelines for Use in Primary Care, Second Edition.  World Pharmacologist [facility]). Score between 0-7:  no or low risk or alcohol related problems. Score between 8-15:  moderate risk of alcohol related problems. Score between 16-19:  high risk of alcohol related problems. Score 20 or above:  warrants further diagnostic evaluation for alcohol dependence and treatment.   CLINICAL FACTORS:   Bipolar Disorder:   Depressive phase Depression:   Aggression Anhedonia Hopelessness Impulsivity Insomnia Obsessive-Compulsive Disorder   Musculoskeletal: Strength & Muscle Tone: within normal limits Gait & Station: normal Patient leans: N/A  Psychiatric Specialty Exam: Physical Exam  Nursing note and vitals reviewed. Constitutional: He is oriented to person, place, and time. He appears well-developed and well-nourished.  HENT:  Head: Normocephalic and atraumatic.  Respiratory: Effort normal.  Neurological: He is alert and oriented to person, place, and time.    ROS  Blood pressure 113/76, pulse 94, temperature 98.2 F (36.8 C), temperature source Oral, resp. rate 16, height 6' (1.829 m), weight 123.4 kg, SpO2 99 %.Body mass index is 36.89 kg/m.  General Appearance: Disheveled  Eye Contact:  Fair  Speech:  Normal Rate  Volume:  Normal  Mood:  Anxious, Depressed and Dysphoric  Affect:  Congruent  Thought Process:  Coherent and Descriptions of Associations: Loose  Orientation:  Full (Time, Place, and Person)  Thought Content:  Obsessions and Rumination  Suicidal Thoughts:  Yes.  without intent/plan  Homicidal Thoughts:  No  Memory:  Immediate;   Fair Recent;   Fair Remote;   Fair  Judgement:  Impaired  Insight:  Lacking  Psychomotor Activity:  Increased  Concentration:  Concentration: Fair and Attention Span: Fair   Recall:  [employer] of Knowledge:  Fair  Language:  Fair  Akathisia:  Negative  Handed:  Right  AIMS (if indicated):     Assets:  Desire for Improvement Financial Resources/Insurance Housing Physical Health Resilience Social Support  ADL's:  Intact  Cognition:  WNL  Sleep:  Number of Hours: 6.75      COGNITIVE FEATURES THAT CONTRIBUTE TO RISK:  None    SUICIDE RISK:   Moderate:  Frequent suicidal ideation with limited intensity, and duration, some specificity in terms of plans, no associated intent, good self-control, limited dysphoria/symptomatology, some risk factors present, and identifiable protective factors, including available and accessible social support.  PLAN OF CARE: Patient is seen and examined.  Patient is a 42 year old male with a past psychiatric history significant for major depression versus bipolar disorder; depressed versus obsessive-compulsive disorder.  He will be admitted to the hospital.  He will be integrated into the milieu.  I am going to stop his Prozac, and try Luvox 50 mg p.o. nightly for his intrusive thoughts to see if that helps at all with regard to his depression.  He will be continued on his BuSpar for anxiety, Seroquel for sleep and for mood stability as well as treatment resistant depression.  He will continue his metformin at thousand milligrams p.o. twice daily for his diabetes.  I certify that inpatient services furnished can reasonably be expected to improve the patient's condition.   Sharma Covert, MD 04/07/2018, 8:49 AM

## 2018-04-07 NOTE — Progress Notes (Signed)
D: Patient endorses passive SI but denies HI or AVH and verbally contracts for safety on the unit. Patient presents as flat and depressed but cooperative.  Pt. Did come to staff and report that he was having some intrusive thoughts to hurt himself but spoke with staff about using his coping skills and accepted medication.  Pt. Did report that sleep and masturbation help control his thoughts at times but stated that he understood that it was not appropriate on the unit.  Upon speaking with MD Mallie Darting, patient was moved at a room by himself and patient was pleased with this.  Pt. States that he did not sleep well last night but states he has a good appetite and denies any physical pain.  Pt. Is visualized on the unit interacting with staff and his peers.  Pt. Is attending/participating in groups.  A: Patient given emotional support from RN. Patient encouraged to come to staff with concerns and/or questions. Patient's medication routine continued. Patient's orders and plan of care reviewed.   R: Patient remains appropriate and cooperative. Will continue to monitor patient q15 minutes for safety.

## 2018-04-07 NOTE — Progress Notes (Signed)
Adult Psychoeducational Group Note  Date:  04/07/2018 Time:  4:54 AM  Group Topic/Focus:  Wrap-Up Group:   The focus of this group is to help patients review their daily goal of treatment and discuss progress on daily workbooks.  Participation Level:  Active  Participation Quality:  Appropriate  Affect:  Appropriate  Cognitive:  Appropriate  Insight: Appropriate     Engagement in Group:  EngagedModes of Intervention:  Activity and Discussion  Additional Comments:  Pt attend wrap up group. His day was a 5. Pt said he cut his wrist twice. He  Hurt his hand before coming here today.  Lenice Llamas Long 04/07/2018, 4:54 AM

## 2018-04-07 NOTE — BHH Group Notes (Signed)
[facility] Mental [facility] Association Group Therapy      04/07/2018 2:08 PM  Type of Therapy: Mental Health Association Presentation  Participation Level: Active  Participation Quality: Attentive  Affect: Appropriate  Cognitive: Oriented  Insight: Developing/Improving  Engagement in Therapy: Engaged  Modes of Intervention: Discussion, Education and Socialization  Summary of Progress/Problems: [place] Speaker came to talk about his personal journey with mental health. The pt processed ways by which to relate to the speaker. [place] speaker provided handouts and educational information pertaining to groups and services offered by the [facility]. Pt was engaged in speaker's presentation and was receptive to resources provided.    [place] Social Worker

## 2018-04-07 NOTE — Therapy (Signed)
Occupational Therapy Group Note  Date:  04/07/2018 Time:  2:34 PM  Group Topic/Focus:  Yoga/Relaxation  Participation Level:  Active  Participation Quality:  Appropriate  Affect:  Flat  Cognitive:  Appropriate  Insight: Improving  Engagement in Group:  Engaged  Modes of Intervention:  Activity, Discussion, Education and Socialization  Additional Comments:    S: "I have always wanted to try this, now I am pretty relaxed and sleepy"  O: Education given on stress management and relaxation to develop coping skills when reintegrating into community. Healthy stress management strategies brainstormed within group, pt encouraged to contribute responses. Pt guided through relaxation chair yoga. Pt asked if pain a factor in mobility, adapted exercises to mett mobility needs and safety. PMR delivered at end of session.   A: Pt presents to group, with flat affect, engaged and participatory throughout entirety of session. Pt shares that exercise can be helpful in managing stress. Pt participated in yoga, verbalizing enjoyment and wishes to continue this practice for relaxation.   P: OT will continue to follow up for implementation of coping skills while pt acute.   Zenovia Jarred, MSOT, OTR/L Behavioral Health OT/ Acute Relief OT  Zenovia Jarred 04/07/2018, 2:34 PM

## 2018-04-07 NOTE — Tx Team (Signed)
Interdisciplinary Treatment and Diagnostic Plan Update  04/07/2018 Time of Session: 9:55am [name] MRN: [id number]  Principal Diagnosis: <principal problem not specified>  Secondary Diagnoses: Active Problems:   Schizophrenia ([place])   Current Medications:  Current Facility-Administered Medications  Medication Dose Route Frequency Provider Last Rate Last Dose  . acetaminophen (TYLENOL) tablet 650 mg  650 mg Oral Q4H PRN Suella Broad, FNP      . alum & mag hydroxide-simeth (MAALOX/MYLANTA) 200-200-20 MG/5ML suspension 30 mL  30 mL Oral Q4H PRN Starkes-Perry, Gayland Curry, FNP      . busPIRone (BUSPAR) tablet 10 mg  10 mg Oral TID Suella Broad, FNP   10 mg at 04/07/18 0811  . fluvoxaMINE (LUVOX) tablet 50 mg  50 mg Oral QHS Sharma Covert, MD      . Influenza vac split quadrivalent PF (FLUARIX) injection 0.5 mL  0.5 mL Intramuscular Tomorrow-1000 Sharma Covert, MD      . risperiDONE (RISPERDAL M-TABS) disintegrating tablet 2 mg  2 mg Oral Q8H PRN Suella Broad, FNP       And  . LORazepam (ATIVAN) tablet 1 mg  1 mg Oral PRN Starkes-Perry, Gayland Curry, FNP       And  . ziprasidone (GEODON) injection 20 mg  20 mg Intramuscular PRN Starkes-Perry, Gayland Curry, FNP      . magnesium hydroxide (MILK OF MAGNESIA) suspension 30 mL  30 mL Oral Daily PRN Starkes-Perry, Gayland Curry, FNP      . metFORMIN (GLUCOPHAGE) tablet 1,000 mg  1,000 mg Oral BID WC Suella Broad, FNP   1,000 mg at 04/07/18 0811  . multivitamin with minerals tablet 1 tablet  1 tablet Oral Daily Suella Broad, FNP   1 tablet at 04/07/18 0811  . ondansetron (ZOFRAN) tablet 4 mg  4 mg Oral Q8H PRN Suella Broad, FNP      . pantoprazole (PROTONIX) EC tablet 40 mg  40 mg Oral Daily Suella Broad, FNP   40 mg at 04/07/18 0811  . QUEtiapine (SEROQUEL) tablet 200 mg  200 mg Oral QHS Sharma Covert, MD      . traZODone (DESYREL) tablet 100 mg  100 mg Oral QHS PRN  Suella Broad, FNP   100 mg at 04/06/18 2138   PTA Medications: Medications Prior to Admission  Medication Sig Dispense Refill Last Dose  . busPIRone (BUSPAR) 10 MG tablet Take 1 tablet (10 mg total) by mouth 3 (three) times daily. 90 tablet 1 04/04/2018  . FLUoxetine (PROZAC) 40 MG capsule Take 1 capsule (40 mg total) by mouth daily. 30 capsule 1 04/04/2018  . hydrOXYzine (ATARAX/VISTARIL) 50 MG tablet Take 1 tablet (50 mg total) by mouth 3 (three) times daily as needed for anxiety. (Patient taking differently: Take 50 mg by mouth once. ) 30 tablet 1 04/05/2018 at Unknown time  . metFORMIN (GLUCOPHAGE) 1000 MG tablet Take 1 tablet (1,000 mg total) by mouth 2 (two) times daily with a meal. 60 tablet 1 04/04/2018  . Multiple Vitamin (MULTIVITAMIN WITH MINERALS) TABS tablet Take 1 tablet by mouth daily. 30 tablet 0 04/04/2018 at Unknown time  . pantoprazole (PROTONIX) 40 MG tablet Take 1 tablet (40 mg total) by mouth daily. 30 tablet 1 04/04/2018 at Unknown time  . polyethylene glycol (MIRALAX / GLYCOLAX) packet Take 17 g by mouth daily. (Patient not taking: Reported on 04/06/2018) 30 each 1 Not Taking at Unknown time  . QUEtiapine (SEROQUEL) 100 MG tablet  Take 1 tablet (100 mg total) by mouth at bedtime. 30 tablet 1 04/05/2018 at Unknown time  . traZODone (DESYREL) 100 MG tablet Take 1 tablet (100 mg total) by mouth at bedtime as needed for sleep. 30 tablet 1 04/05/2018 at Unknown time    Patient Stressors: Medication change or noncompliance  Patient Strengths: Ability for insight Communication skills Supportive family/friends  Treatment Modalities: Medication Management, Group therapy, Case management,  1 to 1 session with clinician, Psychoeducation, Recreational therapy.   Physician Treatment Plan for Primary Diagnosis: <principal problem not specified> Long Term Goal(s):     Short Term Goals:    Medication Management: Evaluate patient's response, side effects, and tolerance  of medication regimen.  Therapeutic Interventions: 1 to 1 sessions, Unit Group sessions and Medication administration.  Evaluation of Outcomes: Not Met  Physician Treatment Plan for Secondary Diagnosis: Active Problems:   Schizophrenia ([place])  Long Term Goal(s):     Short Term Goals:       Medication Management: Evaluate patient's response, side effects, and tolerance of medication regimen.  Therapeutic Interventions: 1 to 1 sessions, Unit Group sessions and Medication administration.  Evaluation of Outcomes: Not Met   RN Treatment Plan for Primary Diagnosis: <principal problem not specified> Long Term Goal(s): Knowledge of disease and therapeutic regimen to maintain health will improve  Short Term Goals: Ability to participate in decision making will improve, Ability to verbalize feelings will improve, Ability to disclose and discuss suicidal ideas, Ability to identify and develop effective coping behaviors will improve and Compliance with prescribed medications will improve  Medication Management: RN will administer medications as ordered by provider, will assess and evaluate patient's response and provide education to patient for prescribed medication. RN will report any adverse and/or side effects to prescribing provider.  Therapeutic Interventions: 1 on 1 counseling sessions, Psychoeducation, Medication administration, Evaluate responses to treatment, Monitor vital signs and CBGs as ordered, Perform/monitor CIWA, COWS, AIMS and Fall Risk screenings as ordered, Perform wound care treatments as ordered.  Evaluation of Outcomes: Not Met   LCSW Treatment Plan for Primary Diagnosis: <principal problem not specified> Long Term Goal(s): Safe transition to appropriate next level of care at discharge, Engage patient in therapeutic group addressing interpersonal concerns.  Short Term Goals: Engage patient in aftercare planning with referrals and resources  Therapeutic Interventions:  Assess for all discharge needs, 1 to 1 time with Social worker, Explore available resources and support systems, Assess for adequacy in community support network, Educate family and significant other(s) on suicide prevention, Complete Psychosocial Assessment, Interpersonal group therapy.  Evaluation of Outcomes: Not Met   Progress in Treatment: Attending groups: Yes. Participating in groups: Yes. Taking medication as prescribed: Yes. Toleration medication: Yes. Family/Significant other contact made: No, will contact:  the patient's mother Patient understands diagnosis: Yes. Discussing patient identified problems/goals with staff: Yes. Medical problems stabilized or resolved: Yes. Denies suicidal/homicidal ideation: No. Issues/concerns per patient self-inventory: No. Other:   New problem(s) identified: None   New Short Term/Long Term Goal(s): medication stabilization, elimination of SI thoughts, development of comprehensive mental wellness plan.   Patient Goals:  "I really want to get back into ECT"  Discharge Plan or Barriers: CSW will continue to follow for possible ECT placement. Patient follow up at [facility] for outpatient medication management and therapy services.   Reason for Continuation of Hospitalization: Depression Medication stabilization Suicidal ideation  Estimated Length of Stay: 3-5 days   Attendees: Patient: Marc Long  04/07/2018 11:29 AM  Physician: Dr. Myles Lipps,  MD 04/07/2018 11:29 AM  Nursing: Danae Chen.Loletha Grayer, RN 04/07/2018 11:29 AM  RN Care Manager:X 04/07/2018 11:29 AM  Social Worker: Radonna Ricker, [place] 04/07/2018 11:29 AM  Recreational Therapist: Rhunette Croft 04/07/2018 11:29 AM  Other: Marvia Pickles, NP 04/07/2018 11:29 AM  Other:  04/07/2018 11:29 AM  Other: 04/07/2018 11:29 AM    Scribe for Treatment Team: Marylee Floras, [place] 04/07/2018 11:29 AM

## 2018-04-07 NOTE — Progress Notes (Signed)
Pt is new to the unit late this afternoon.  Pt reports he was having suicidal thoughts and did take a large amount of pills, but then vomited them up after talking to his mother who came and took him to the ED.  He has been observed sitting in the dayroom watching TV during the evening with very little interaction with other patients.  Pt states he is still having some passive suicidal thoughts, but contracts for safety with Probation officer.  He denies HI/AVH.  Pt was encouraged to make his needs known to staff.  Writer reviewed all available meds with pt.  Pt voiced understanding.  Support and encouragement offered.  Discharge plans are in process.  Safety maintained with q15 minute checks.
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
## 2018-04-07 NOTE — [facility] Suicide Risk Assessment (Signed)
[facility] INPATIENT:  Family/Significant Other Suicide Prevention Education  Suicide Prevention Education:  Education Completed;  (name of family member/significant other) has been identified by the patient as the family member/significant other with whom the patient will be residing, and identified as the person(s) who will aid the patient in the event of a mental health crisis (suicidal ideations/suicide attempt).  With written consent from the patient, the family member/significant other has been provided the following suicide prevention education, prior to the and/or following the discharge of the patient.  The suicide prevention education provided includes the following:  Suicide risk factors  Suicide prevention and interventions  National Suicide Hotline telephone number  [facility] assessment telephone number  [facility] Emergency Assistance [place] and/or Residential Mobile Crisis Unit telephone number  Request made of family/significant other to:  Remove weapons (e.g., guns, rifles, knives), all items previously/currently identified as safety concern.  Ms. Tamala Julian stated she will also share SPE with her son's room mate to make sure there are no weapons in the home they share.   Remove drugs/medications (over-the-counter, prescriptions, illicit drugs), all items previously/currently identified as a safety concern. N- with mother, Alen Blew [phone]) The family member/significant other verbalizes understanding of the suicide prevention education information provided.  The family member/significant other agrees to remove the items of safety concern listed above.  Cherie Bohaboy 04/07/2018, 2:44 PM

## 2018-04-07 NOTE — Progress Notes (Signed)
D   Pt is pleasant on approach and cooperative   He is visible on the milieu interacting with others and is appropriate in his behavior   He does endorse depression but expresses hope for the new medicine he is starting    He does contract for safety A    Verbal support given   Medications administered and effectiveness monitored    Q 15 min checks  Discussed new medications with patient R   Pt is safe at this time and receptive to verbal support

## 2018-04-07 NOTE — H&P (Signed)
Psychiatric Admission Assessment Adult  Patient Identification: Marc Long MRN:  [id number] Date of Evaluation:  04/07/2018 Chief Complaint:  MDD Principal Diagnosis: <principal problem not specified> Diagnosis:   Patient Active Problem List   Diagnosis Date Noted  . Schizophrenia ([place]) [F20.9] 04/06/2018  . Severe recurrent major depression without psychotic features ([place]) [F33.2] 12/16/2017  . MDD (major depressive disorder), recurrent episode, severe ([place]) [F33.2] 12/10/2017  . Obesity (BMI 30-39.9) [E66.9] 12/09/2017  . Depression with suicidal ideation [F32.9, R45.851]   . DKA (diabetic ketoacidoses) ([place]) [E11.10] 12/05/2017  . Hypertriglyceridemia [E78.1] 02/05/2017  . DM (diabetes mellitus), type 2, uncontrolled ([place]) [E11.65] 12/12/2016  . Dehydration [E86.0] 12/12/2016  . Suicidal ideations [R45.851] 12/12/2016  . Bipolar disorder (manic depression) ([place]) [F31.9] 12/12/2016  . Hyperglycemia due to type 2 diabetes mellitus ([place]) [E11.65] 12/12/2016  . AKI (acute kidney injury) ([place]) [N17.9] 12/12/2016  . DKA, type 2, not at goal [facility]) [E11.10] 12/12/2016  . Obesity, Class III, BMI 40-49.9 (morbid obesity) ([place]) [E66.01]   . Type 2 diabetes mellitus without complication, without long-term current use of insulin ([place]) [E11.9] 12/10/2016  . Lithium use [Z79.899] 12/10/2016  . Moderate single current episode of major depressive disorder ([place]) [F32.1] 04/15/2016  . Pain in joint, shoulder region [M25.519] 04/14/2016  . Diverticulosis of large intestine without hemorrhage [K57.30] 11/07/2015  . Calculus of gallbladder without cholecystitis [K80.20] 11/07/2015   History of Present Illness: Patient is seen and examined.  Patient is a 42 year old male with a past psychiatric history significant for major depression versus bipolar disorder; depressed.  Patient presented to the [facility] emergency department voluntarily by way of his mother secondary to a reported overdose.   The patient stated he had ingested 21 Vistaril, 6 Seroquel, 3 trazodone, and 1 melatonin at approximately 11 PM.  Patient stated that after taking the pills he spoke to his mother, and admitted that he did it so she could bring him to the hospital.  He made himself throw up prior to coming to the hospital.  He stated that he was significantly depressed.  He admitted that he had previously attempted to harm himself in the past.  He denied any auditory, visual or tactile hallucinations.  No homicidal ideation.  He is followed by [facility] and has a therapist.  He was last hospitalized at [facility] on 12/10/2017.  He was hospitalized at that time because of suicidal ideation depression.  He was transferred to the [facility] for ECT.  He received 4 treatments at that time.  He was then discharged for outpatient treatment.  He stated that he is still currently depressed.  He stated that he went to [facility] earlier this week and they increased his fluoxetine to 60 mg p.o. daily.  He stated he was unable to continue outpatient ECT because of financial reasons.  He stated that he continues to have intrusive thoughts which makes him want to harm himself.  He also stated that these intrusive thoughts that started when he was a teenager when he felt as though he had to control thoughts and not to "rape people".  His intrusive thoughts now are to injure himself.  He stated that occasionally he will look for a pen or pencil to stab to his hand.  He sometimes goes in the bathroom and hits his hand violently to control these urges.  He denied any other compulsive or obsessive behaviors.  He was admitted to the hospital for evaluation and stabilization.  Associated Signs/Symptoms:  Depression Symptoms:  depressed mood, anhedonia, psychomotor retardation, fatigue, feelings of worthlessness/guilt, difficulty concentrating, hopelessness, suicidal thoughts without plan, suicidal attempt, anxiety, loss of  energy/fatigue, disturbed sleep, (Hypo) Manic Symptoms:  Impulsivity, Labiality of Mood, Anxiety Symptoms:  Excessive Worry, Obsessive Compulsive Symptoms:   Intrusive thoughts about self-harm., Psychotic Symptoms:  Denied PTSD Symptoms: Negative Total Time spent with patient: 30 minutes  Past Psychiatric History: Patient has 3 psychiatric hospitalizations in the last year or so.  Prior to a year or 2 ago patient was never psychiatrically hospitalized.  He has 2 previous overdoses.  He received ECT x4 at [facility] after his last hospitalization here at [facility].  Is the patient at risk to self? Yes.    Has the patient been a risk to self in the past 6 months? Yes.    Has the patient been a risk to self within the distant past? Yes.    Is the patient a risk to others? No.  Has the patient been a risk to others in the past 6 months? No.  Has the patient been a risk to others within the distant past? Yes.     Prior Inpatient Therapy:   Prior Outpatient Therapy:    Alcohol Screening: Patient refused Alcohol Screening Tool: Yes 1. How often do you have a drink containing alcohol?: 2 to 4 times a month 2. How many drinks containing alcohol do you have on a typical day when you are drinking?: 1 or 2 3. How often do you have six or more drinks on one occasion?: Never AUDIT-C Score: 2 4. How often during the last year have you found that you were not able to stop drinking once you had started?: Never 5. How often during the last year have you failed to do what was normally expected from you becasue of drinking?: Never 6. How often during the last year have you needed a first drink in the morning to get yourself going after a heavy drinking session?: Never 7. How often during the last year have you had a feeling of guilt of remorse after drinking?: Never 8. How often during the last year have you been unable to remember what happened the night before because you had  been drinking?: Never 9. Have you or someone else been injured as a result of your drinking?: No 10. Has a relative or friend or a doctor or another health worker been concerned about your drinking or suggested you cut down?: No Alcohol Use Disorder Identification Test Final Score (AUDIT): 2 Intervention/Follow-up: Alcohol Education Substance Abuse History in the last 12 months:  No. Consequences of Substance Abuse: Negative Previous Psychotropic Medications: Yes  Psychological Evaluations: Yes  Past Medical History:  Past Medical History:  Diagnosis Date  . Depression   . Diabetes mellitus without complication ([place])   . Gallstones   . Obesity, Class III, BMI 40-49.9 (morbid obesity) ([place])    History reviewed. No pertinent surgical history. Family History:  Family History  Problem Relation Age of Onset  . Hypertension Mother   . Depression Mother   . Heart attack Maternal Grandfather   . Lymphoma Maternal Grandmother   . Diabetes Maternal Aunt   . Cancer Maternal Aunt    Family Psychiatric  History: No suicide in the family.  Mother has a history of depression.  Biological father had problems with gambling. Tobacco Screening: Have you used any form of tobacco in the last 30 days? (Cigarettes, Smokeless Tobacco, Cigars, and/or Pipes):  No Social History: 42 year old male.  Single.  No children.  Lives with roommate.  Receives unemployment benefits. Social History   Substance and Sexual Activity  Alcohol Use Yes  . Alcohol/week: 0.0 standard drinks   Comment: once every 2 weeks. Wine coolers     Social History   Substance and Sexual Activity  Drug Use No    Additional Social History:                           Allergies:   Allergies  Allergen Reactions  . Bee Venom Swelling  . Keflex [Cephalexin]     UPSET STOMACH  . Relafen [Nabumetone]     Blood in stool  . Shellfish Allergy Nausea Only   Lab Results:  Results for orders placed or performed during the  hospital encounter of 04/06/18 (from the past 48 hour(s))  Hemoglobin A1c     Status: None   Collection Time: 04/07/18  6:30 AM  Result Value Ref Range   Hgb A1c MFr Bld 5.6 4.8 - 5.6 %    Comment: (NOTE) Pre diabetes:          5.7%-6.4% Diabetes:              >6.4% Glycemic control for   <7.0% adults with diabetes    Mean Plasma Glucose 114.02 mg/dL    Comment: Performed at [facility] [address], [place], [place] [address]  Lipid panel     Status: Abnormal   Collection Time: 04/07/18  6:32 AM  Result Value Ref Range   Cholesterol 172 0 - 200 mg/dL   Triglycerides 186 (H) <150 mg/dL   HDL 31 (L) >40 mg/dL   Total CHOL/HDL Ratio 5.5 RATIO   VLDL 37 0 - 40 mg/dL   LDL Cholesterol 104 (H) 0 - 99 mg/dL    Comment:        Total Cholesterol/HDL:CHD Risk Coronary Heart Disease Risk Table                     Men   Women  1/2 Average Risk   3.4   3.3  Average Risk       5.0   4.4  2 X Average Risk   9.6   7.1  3 X Average Risk  23.4   11.0        Use the calculated Patient Ratio above and the CHD Risk Table to determine the patient's CHD Risk.        ATP III CLASSIFICATION (LDL):  <100     mg/dL   Optimal  100-129  mg/dL   Near or Above                    Optimal  130-159  mg/dL   Borderline  160-189  mg/dL   High  >190     mg/dL   Very High Performed at [facility] [address] [place], [place] [address]   TSH     Status: None   Collection Time: 04/07/18  6:32 AM  Result Value Ref Range   TSH 2.847 0.350 - 4.500 uIU/mL    Comment: Performed by a 3rd Generation assay with a functional sensitivity of <=0.01 uIU/mL. Performed at [facility], [place] [address], [place],  [address]     Blood Alcohol level:  Lab Results  Component Value Date   ETH <10  04/06/2018   ETH <10 28/76/8115    Metabolic Disorder Labs:  Lab Results  Component Value Date   HGBA1C 5.6 04/07/2018   MPG 114.02 04/07/2018   MPG 432.59  12/09/2017   No results found for: PROLACTIN Lab Results  Component Value Date   CHOL 172 04/07/2018   TRIG 186 (H) 04/07/2018   HDL 31 (L) 04/07/2018   CHOLHDL 5.5 04/07/2018   VLDL 37 04/07/2018   LDLCALC 104 (H) 04/07/2018   LDLCALC 85 03/05/2017    Current Medications: Current Facility-Administered Medications  Medication Dose Route Frequency Provider Last Rate Last Dose  . acetaminophen (TYLENOL) tablet 650 mg  650 mg Oral Q4H PRN Suella Broad, FNP      . alum & mag hydroxide-simeth (MAALOX/MYLANTA) 200-200-20 MG/5ML suspension 30 mL  30 mL Oral Q4H PRN Starkes-Perry, Gayland Curry, FNP      . busPIRone (BUSPAR) tablet 10 mg  10 mg Oral TID Suella Broad, FNP   10 mg at 04/07/18 0811  . fluvoxaMINE (LUVOX) tablet 50 mg  50 mg Oral QHS Sharma Covert, MD      . Influenza vac split quadrivalent PF (FLUARIX) injection 0.5 mL  0.5 mL Intramuscular Tomorrow-1000 Sharma Covert, MD      . risperiDONE (RISPERDAL M-TABS) disintegrating tablet 2 mg  2 mg Oral Q8H PRN Suella Broad, FNP       And  . LORazepam (ATIVAN) tablet 1 mg  1 mg Oral PRN Starkes-Perry, Gayland Curry, FNP       And  . ziprasidone (GEODON) injection 20 mg  20 mg Intramuscular PRN Starkes-Perry, Gayland Curry, FNP      . magnesium hydroxide (MILK OF MAGNESIA) suspension 30 mL  30 mL Oral Daily PRN Starkes-Perry, Gayland Curry, FNP      . metFORMIN (GLUCOPHAGE) tablet 1,000 mg  1,000 mg Oral BID WC Suella Broad, FNP   1,000 mg at 04/07/18 0811  . multivitamin with minerals tablet 1 tablet  1 tablet Oral Daily Suella Broad, FNP   1 tablet at 04/07/18 0811  . ondansetron (ZOFRAN) tablet 4 mg  4 mg Oral Q8H PRN Suella Broad, FNP      . pantoprazole (PROTONIX) EC tablet 40 mg  40 mg Oral Daily Suella Broad, FNP   40 mg at 04/07/18 0811  . QUEtiapine (SEROQUEL) tablet 200 mg  200 mg Oral QHS Sharma Covert, MD      . traZODone (DESYREL) tablet 100 mg  100 mg Oral QHS  PRN Suella Broad, FNP   100 mg at 04/06/18 2138   PTA Medications: Medications Prior to Admission  Medication Sig Dispense Refill Last Dose  . busPIRone (BUSPAR) 10 MG tablet Take 1 tablet (10 mg total) by mouth 3 (three) times daily. 90 tablet 1 04/04/2018  . FLUoxetine (PROZAC) 40 MG capsule Take 1 capsule (40 mg total) by mouth daily. 30 capsule 1 04/04/2018  . hydrOXYzine (ATARAX/VISTARIL) 50 MG tablet Take 1 tablet (50 mg total) by mouth 3 (three) times daily as needed for anxiety. (Patient taking differently: Take 50 mg by mouth once. ) 30 tablet 1 04/05/2018 at Unknown time  . metFORMIN (GLUCOPHAGE) 1000 MG tablet Take 1 tablet (1,000 mg total) by mouth 2 (two) times daily with a meal. 60 tablet 1 04/04/2018  . Multiple Vitamin (MULTIVITAMIN WITH MINERALS) TABS tablet Take 1 tablet by mouth daily. 30 tablet 0 04/04/2018 at Unknown time  . pantoprazole (PROTONIX) 40  MG tablet Take 1 tablet (40 mg total) by mouth daily. 30 tablet 1 04/04/2018 at Unknown time  . polyethylene glycol (MIRALAX / GLYCOLAX) packet Take 17 g by mouth daily. (Patient not taking: Reported on 04/06/2018) 30 each 1 Not Taking at Unknown time  . QUEtiapine (SEROQUEL) 100 MG tablet Take 1 tablet (100 mg total) by mouth at bedtime. 30 tablet 1 04/05/2018 at Unknown time  . traZODone (DESYREL) 100 MG tablet Take 1 tablet (100 mg total) by mouth at bedtime as needed for sleep. 30 tablet 1 04/05/2018 at Unknown time    Musculoskeletal: Strength & Muscle Tone: within normal limits Gait & Station: normal Patient leans: N/A  Psychiatric Specialty Exam: Physical Exam  Nursing note and vitals reviewed. Constitutional: He is oriented to person, place, and time. He appears well-developed and well-nourished.  HENT:  Head: Normocephalic and atraumatic.  Respiratory: Effort normal.  Neurological: He is alert and oriented to person, place, and time.    ROS  Blood pressure 113/76, pulse 94, temperature 98.2 F (36.8  C), temperature source Oral, resp. rate 16, height 6' (1.829 m), weight 123.4 kg, SpO2 99 %.Body mass index is 36.89 kg/m.  General Appearance: Disheveled  Eye Contact:  Fair  Speech:  Normal Rate  Volume:  Normal  Mood:  Anxious, Depressed and Dysphoric  Affect:  Congruent  Thought Process:  Coherent and Descriptions of Associations: Circumstantial  Orientation:  Full (Time, Place, and Person)  Thought Content:  Obsessions and Rumination  Suicidal Thoughts:  Yes.  without intent/plan  Homicidal Thoughts:  No  Memory:  Immediate;   Fair Recent;   Fair Remote;   Fair  Judgement:  Impaired  Insight:  Lacking  Psychomotor Activity:  Increased  Concentration:  Concentration: Fair and Attention Span: Fair  Recall:  [employer] of Knowledge:  Fair  Language:  Good  Akathisia:  Negative  Handed:  Right  AIMS (if indicated):     Assets:  Desire for Improvement Financial Resources/Insurance Housing Physical Health Resilience Social Support  ADL's:  Intact  Cognition:  WNL  Sleep:  Number of Hours: 6.75    Treatment Plan Summary: Daily contact with patient to assess and evaluate symptoms and progress in treatment, Medication management and Plan : Patient is seen and examined.  Patient is a 42 year old male with a past psychiatric history significant for major depression versus bipolar disorder; depressed versus obsessive-compulsive disorder.  He will be admitted to the hospital.  He will be integrated into the milieu.  I am going to stop his Prozac, and try Luvox 50 mg p.o. nightly for his intrusive thoughts to see if that helps at all with regard to his depression.  He will be continued on his BuSpar for anxiety, Seroquel for sleep and for mood stability as well as treatment resistant depression.  He will continue his metformin at thousand milligrams p.o. twice daily for his diabetes.  Observation Level/Precautions:  15 minute checks  Laboratory:  Chemistry Profile  Psychotherapy:     Medications:    Consultations:    Discharge Concerns:    Estimated LOS:  Other:     Physician Treatment Plan for Primary Diagnosis: <principal problem not specified> Long Term Goal(s): Improvement in symptoms so as ready for discharge  Short Term Goals: Ability to identify changes in lifestyle to reduce recurrence of condition will improve, Ability to verbalize feelings will improve, Ability to disclose and discuss suicidal ideas, Ability to demonstrate self-control will improve, Ability to identify and  develop effective coping behaviors will improve and Ability to maintain clinical measurements within normal limits will improve  Physician Treatment Plan for Secondary Diagnosis: Active Problems:   Schizophrenia ([place])  Long Term Goal(s): Improvement in symptoms so as ready for discharge  Short Term Goals: Ability to identify changes in lifestyle to reduce recurrence of condition will improve, Ability to verbalize feelings will improve, Ability to disclose and discuss suicidal ideas, Ability to demonstrate self-control will improve, Ability to identify and develop effective coping behaviors will improve and Ability to maintain clinical measurements within normal limits will improve  I certify that inpatient services furnished can reasonably be expected to improve the patient's condition.    Sharma Covert, MD 10/23/201911:19 AM

## 2018-04-08 LAB — PROLACTIN: Prolactin: 16.1 ng/mL — ABNORMAL HIGH (ref 4.0–15.2)

## 2018-04-08 MED ORDER — BUSPIRONE HCL 15 MG PO TABS
15.0000 mg | ORAL_TABLET | Freq: Three times a day (TID) | ORAL | Status: DC
  Administered 2018-04-08 – 2018-04-19 (×33): 15 mg via ORAL
  Filled 2018-04-08 (×39): qty 1

## 2018-04-08 MED ORDER — QUETIAPINE FUMARATE 25 MG PO TABS
25.0000 mg | ORAL_TABLET | Freq: Two times a day (BID) | ORAL | Status: DC
  Administered 2018-04-08 – 2018-04-09 (×2): 25 mg via ORAL
  Filled 2018-04-08 (×4): qty 1

## 2018-04-08 MED ORDER — FLUVOXAMINE MALEATE 50 MG PO TABS
75.0000 mg | ORAL_TABLET | Freq: Every day | ORAL | Status: DC
  Administered 2018-04-08 – 2018-04-09 (×2): 75 mg via ORAL
  Filled 2018-04-08 (×4): qty 2

## 2018-04-08 NOTE — Progress Notes (Signed)
Pt was observed in the dayroom, no interaction with peers. Pt attended wrap-up group. Pt appears flat in affect and mood. Pt endorses passive SI but verbal contracts for safety. Pt denies HI/AVH/Pain at this time. Pt is guarded with interaction. PRN trazodone requested and given. Support and encouragement provided. Will continue with POC.

## 2018-04-08 NOTE — Progress Notes (Signed)
Patient was given risperdal 2 mg po this morning because he scratched his L arm while sitting in group, stated he was very anxious/agitated.  Approximately 1210 was given ativan 1 mg po because he stated he felt like he could "slit his throat".   MD talking with patient at this time.

## 2018-04-08 NOTE — Progress Notes (Signed)
D:  Patient's self inventory sheet, patient sleeps good, sleep medication helpful.  Good appetite, normal energy level, good concentration.  Rated depression 6, hopeless 4, denied anxiety.  Denied withdrawals.  SI, contracts for safey.  Denied physica problems.  Denied physical pain.  Marc Long is tell someone about how he is feeling.  Plans to reach out.  No discharge plans. A:  Medications administered per MD orders.  Emotional support and encouragement given patient. R:  Denied HI.  Denied A/V hallucinations.  Safety maintained with 15 minute checks.  Patient has been SI off/on today.  Patient stated he will contract for safety and not actually hurt himself.

## 2018-04-08 NOTE — Plan of Care (Signed)
Nurse discussed anxiety, depression, coping skills with patient. 

## 2018-04-08 NOTE — [facility] Group Notes (Signed)
LCSW Group Therapy Note 04/08/2018 4:07 PM  Type of Therapy and Topic: Group Therapy: Avoiding Self-Sabotaging and Enabling Behaviors  Participation Level: Active  Description of Group:  In this group, patients will learn how to identify obstacles, self-sabotaging and enabling behaviors, as well as: what are they, why do we do them and what needs these behaviors meet. Discuss unhealthy relationships and how to have positive healthy boundaries with those that sabotage and enable. Explore aspects of self-sabotage and enabling in yourself and how to limit these self-destructive behaviors in everyday life.  Therapeutic Goals: 1. Patient will identify one obstacle that relates to self-sabotage and enabling behaviors 2. Patient will identify one personal self-sabotaging or enabling behavior they did prior to admission 3. Patient will state a plan to change the above identified behavior 4. Patient will demonstrate ability to communicate their needs through discussion and/or role play.   Summary of Patient Progress:  Marc Long was engaged and participated throughout the group session. Marc Long reports that his self sabotaging behavior is apathy. Marc Long reports he has los his interest to care for anything.     Therapeutic Modalities:  Cognitive Behavioral Therapy Person-Centered Therapy Motivational Interviewing   [place] Clinical Social Worker

## 2018-04-08 NOTE — Progress Notes (Signed)
[facility] MD Progress Note  04/08/2018 12:33 PM Marc Long  MRN:  [id number] Subjective:  Patient is seen and examined. Patient is a 42 year old male with a past psychiatric history significant for major depression versus bipolar disorder; depressed. Patient presented to the [facility] emergency department voluntarily by way of his mother secondary to a reported overdose. The patient stated he had ingested 21 Vistaril, 6 Seroquel, 3 trazodone, and 1 melatonin at approximately 11 PM. Patient stated that after taking the pills he spoke to his mother, and admitted that he did it so she could bring him to the hospital. He made himself throw up prior to coming to the hospital. He stated that he was significantly depressed. He admitted that he had previously attempted to harm himself in the past. He denied any auditory, visual or tactile hallucinations. No homicidal ideation. He is followed by [facility] and has a therapist. He was last hospitalized at [facility] on 12/10/2017. He was hospitalized at that time because of suicidal ideation depression. He was transferred to the [facility] for ECT. He received 4 treatments at that time. He was then discharged for outpatient treatment. He stated that he is still currently depressed. He stated that he went to [facility] earlier this week and they increased his fluoxetine to 60 mg p.o. daily. He stated he was unable to continue outpatient ECT because of financial reasons. He stated that he continues to have intrusive thoughts which makes him want to harm himself. He also stated that these intrusive thoughts that started when he was a teenager when he felt as though he had to control thoughts and not to "rape people". His intrusive thoughts now are to injure himself. He stated that occasionally he will look for a pen or pencil to stab to his hand. He sometimes goes in the bathroom and hits his hand violently to control these urges.  He denied any other compulsive or obsessive behaviors. He was admitted to the hospital for evaluation and stabilization.  Objective: Patient is seen and examined.  Patient is a 42 year old male with a previous psychiatric history significant for bipolar disorder; depressed versus major depression versus concern for schizoaffective disorder or potentially schizoid personality disorder.  He is seen in follow-up.  He continues to have intrusive thoughts, and has required a as needed dosage of Risperdal and Ativan this morning.  He tolerated the Luvox last night well.  He stated he slept well.  We had a long discussion today about his history.  He has had some issues with sexual deviancy in the past, as well as his thoughts of self-harm.  I had some concern for possible autism in the past, but the patient describes relationships with friends, working in restaurants and engaging with people, and in structures that would clearly lead to discomfort in anyone with autism.  One thing is clear.  Every time he starts to feel better, and others around him see that he is doing better, he feels more pressure to be independent.  Driving is a major obstacle.  He basically refused to learn to drive in the past because he was having feelings towards sexually assaulting females.  He felt like the car itself led to mobility, and put others at risk.  This really has not changed.  In times in the past where he has had jobs that were doing well, he stated his mother and friends would "pressure me" to drive.  Assuming that that independence would make him feel  better, but I think worsened the situation.  I reviewed his old chart again, and I was the one who stopped the lithium because of failure of improvement.  Outside of the sedation from the Luvox he denied any side effects to his current medications.  He continues to have suicidal thoughts.  He denied any auditory or visual hallucinations currently.  Principal Problem: <principal  problem not specified> Diagnosis:   Patient Active Problem List   Diagnosis Date Noted  . Schizophrenia ([place]) [F20.9] 04/06/2018  . Severe recurrent major depression without psychotic features ([place]) [F33.2] 12/16/2017  . MDD (major depressive disorder), recurrent episode, severe ([place]) [F33.2] 12/10/2017  . Obesity (BMI 30-39.9) [E66.9] 12/09/2017  . Severe bipolar I disorder, most recent episode depressed ([place]) [F31.4]   . DKA (diabetic ketoacidoses) ([place]) [E11.10] 12/05/2017  . Hypertriglyceridemia [E78.1] 02/05/2017  . DM (diabetes mellitus), type 2, uncontrolled ([place]) [E11.65] 12/12/2016  . Dehydration [E86.0] 12/12/2016  . Suicidal ideations [R45.851] 12/12/2016  . Bipolar disorder (manic depression) ([place]) [F31.9] 12/12/2016  . Hyperglycemia due to type 2 diabetes mellitus ([place]) [E11.65] 12/12/2016  . AKI (acute kidney injury) ([place]) [N17.9] 12/12/2016  . DKA, type 2, not at goal [facility]) [E11.10] 12/12/2016  . Obesity, Class III, BMI 40-49.9 (morbid obesity) ([place]) [E66.01]   . Type 2 diabetes mellitus without complication, without long-term current use of insulin ([place]) [E11.9] 12/10/2016  . Lithium use [Z79.899] 12/10/2016  . Moderate single current episode of major depressive disorder ([place]) [F32.1] 04/15/2016  . Pain in joint, shoulder region [M25.519] 04/14/2016  . Diverticulosis of large intestine without hemorrhage [K57.30] 11/07/2015  . Calculus of gallbladder without cholecystitis [K80.20] 11/07/2015   Total Time spent with patient: 45 minutes  Past Psychiatric History: See admission H&P  Past Medical History:  Past Medical History:  Diagnosis Date  . Depression   . Diabetes mellitus without complication ([place])   . Gallstones   . Obesity, Class III, BMI 40-49.9 (morbid obesity) ([place])    History reviewed. No pertinent surgical history. Family History:  Family History  Problem Relation Age of Onset  . Hypertension Mother   . Depression Mother   . Heart attack Maternal  Grandfather   . Lymphoma Maternal Grandmother   . Diabetes Maternal Aunt   . Cancer Maternal Aunt    Family Psychiatric  History: See admission H&P Social History:  Social History   Substance and Sexual Activity  Alcohol Use Yes  . Alcohol/week: 0.0 standard drinks   Comment: once every 2 weeks. Wine coolers     Social History   Substance and Sexual Activity  Drug Use No    Social History   Socioeconomic History  . Marital status: Single    Spouse name: Not on file  . Number of children: 0  . Years of education: Not on file  . Highest education level: Not on file  Occupational History  . Occupation: call center  Social Needs  . Financial resource strain: Not on file  . Food insecurity:    Worry: Not on file    Inability: Not on file  . Transportation needs:    Medical: Not on file    Non-medical: Not on file  Tobacco Use  . Smoking status: Never Smoker  . Smokeless tobacco: Never Used  Substance and Sexual Activity  . Alcohol use: Yes    Alcohol/week: 0.0 standard drinks    Comment: once every 2 weeks. Wine coolers  . Drug use: No  . Sexual activity: Not Currently  Birth control/protection: None  Lifestyle  . Physical activity:    Days per week: Not on file    Minutes per session: Not on file  . Stress: Not on file  Relationships  . Social connections:    Talks on phone: Not on file    Gets together: Not on file    Attends religious service: Not on file    Active member of club or organization: Not on file    Attends meetings of clubs or organizations: Not on file    Relationship status: Not on file  Other Topics Concern  . Not on file  Social History Narrative  . Not on file   Additional Social History:                         Sleep: Good  Appetite:  Good  Current Medications: Current Facility-Administered Medications  Medication Dose Route Frequency Provider Last Rate Last Dose  . acetaminophen (TYLENOL) tablet 650 mg  650 mg  Oral Q4H PRN Suella Broad, FNP      . alum & mag hydroxide-simeth (MAALOX/MYLANTA) 200-200-20 MG/5ML suspension 30 mL  30 mL Oral Q4H PRN Starkes-Perry, Gayland Curry, FNP      . busPIRone (BUSPAR) tablet 15 mg  15 mg Oral TID Sharma Covert, MD      . fluvoxaMINE (LUVOX) tablet 75 mg  75 mg Oral QHS Sharma Covert, MD      . magnesium hydroxide (MILK OF MAGNESIA) suspension 30 mL  30 mL Oral Daily PRN Starkes-Perry, Gayland Curry, FNP      . metFORMIN (GLUCOPHAGE) tablet 1,000 mg  1,000 mg Oral BID WC Suella Broad, FNP   1,000 mg at 04/08/18 0800  . multivitamin with minerals tablet 1 tablet  1 tablet Oral Daily Suella Broad, FNP   1 tablet at 04/08/18 0800  . ondansetron (ZOFRAN) tablet 4 mg  4 mg Oral Q8H PRN Suella Broad, FNP      . pantoprazole (PROTONIX) EC tablet 40 mg  40 mg Oral Daily Suella Broad, FNP   40 mg at 04/08/18 0800  . QUEtiapine (SEROQUEL) tablet 200 mg  200 mg Oral QHS Sharma Covert, MD   200 mg at 04/07/18 2224  . QUEtiapine (SEROQUEL) tablet 25 mg  25 mg Oral BID Sharma Covert, MD      . risperiDONE (RISPERDAL M-TABS) disintegrating tablet 2 mg  2 mg Oral Q8H PRN Suella Broad, FNP   2 mg at 04/08/18 1018   And  . ziprasidone (GEODON) injection 20 mg  20 mg Intramuscular PRN Suella Broad, FNP      . traZODone (DESYREL) tablet 100 mg  100 mg Oral QHS PRN Suella Broad, FNP   100 mg at 04/07/18 2224    Lab Results:  Results for orders placed or performed during the hospital encounter of 04/06/18 (from the past 48 hour(s))  Hemoglobin A1c     Status: None   Collection Time: 04/07/18  6:30 AM  Result Value Ref Range   Hgb A1c MFr Bld 5.6 4.8 - 5.6 %    Comment: (NOTE) Pre diabetes:          5.7%-6.4% Diabetes:              >6.4% Glycemic control for   <7.0% adults with diabetes    Mean Plasma Glucose 114.02 mg/dL    Comment: Performed at [facility], [place] [address]  [place], [place] [address]  Lipid panel     Status: Abnormal   Collection Time: 04/07/18  6:32 AM  Result Value Ref Range   Cholesterol 172 0 - 200 mg/dL   Triglycerides 186 (H) <150 mg/dL   HDL 31 (L) >40 mg/dL   Total CHOL/HDL Ratio 5.5 RATIO   VLDL 37 0 - 40 mg/dL   LDL Cholesterol 104 (H) 0 - 99 mg/dL    Comment:        Total Cholesterol/HDL:CHD Risk Coronary Heart Disease Risk Table                     Men   Women  1/2 Average Risk   3.4   3.3  Average Risk       5.0   4.4  2 X Average Risk   9.6   7.1  3 X Average Risk  23.4   11.0        Use the calculated Patient Ratio above and the CHD Risk Table to determine the patient's CHD Risk.        ATP III CLASSIFICATION (LDL):  <100     mg/dL   Optimal  100-129  mg/dL   Near or Above                    Optimal  130-159  mg/dL   Borderline  160-189  mg/dL   High  >190     mg/dL   Very High Performed at [facility] [address], [place], [place] [address]   Prolactin     Status: Abnormal   Collection Time: 04/07/18  6:32 AM  Result Value Ref Range   Prolactin 16.1 (H) 4.0 - 15.2 ng/mL    Comment: (NOTE) Performed At: [facility] [place], [place] [id number] Rush Farmer MD WC:[id number]   TSH     Status: None   Collection Time: 04/07/18  6:32 AM  Result Value Ref Range   TSH 2.847 0.350 - 4.500 uIU/mL    Comment: Performed by a 3rd Generation assay with a functional sensitivity of <=0.01 uIU/mL. Performed at [facility], [place] [address] [place], [place] [address]     Blood Alcohol level:  Lab Results  Component Value Date   ETH <10 04/06/2018   ETH <10 37/03/6268    Metabolic Disorder Labs: Lab Results  Component Value Date   HGBA1C 5.6 04/07/2018   MPG 114.02 04/07/2018   MPG 432.59 12/09/2017   Lab Results  Component Value Date   PROLACTIN 16.1 (H) 04/07/2018   Lab Results  Component Value Date   CHOL 172 04/07/2018   TRIG 186 (H)  04/07/2018   HDL 31 (L) 04/07/2018   CHOLHDL 5.5 04/07/2018   VLDL 37 04/07/2018   LDLCALC 104 (H) 04/07/2018   LDLCALC 85 03/05/2017    Physical Findings: AIMS: Facial and Oral Movements Muscles of Facial Expression: None, normal Lips and Perioral Area: None, normal Jaw: None, normal Tongue: None, normal,Extremity Movements Upper (arms, wrists, hands, fingers): None, normal Lower (legs, knees, ankles, toes): None, normal, Trunk Movements Neck, shoulders, hips: None, normal, Overall Severity Severity of abnormal movements (highest score from questions above): None, normal Incapacitation due to abnormal movements: None, normal Patient's awareness of abnormal movements (rate only patient's report): No Awareness, Dental Status Current problems with teeth and/or dentures?: No Does patient usually wear dentures?: No  CIWA:  COWS:     Musculoskeletal: Strength & Muscle Tone: within normal limits Gait & Station: normal Patient leans: N/A  Psychiatric Specialty Exam: Physical Exam  Nursing note and vitals reviewed. Constitutional: He is oriented to person, place, and time. He appears well-developed and well-nourished.  HENT:  Head: Normocephalic and atraumatic.  Respiratory: Effort normal.  Neurological: He is alert and oriented to person, place, and time.    ROS  Blood pressure 120/80, pulse 95, temperature (!) 97.5 F (36.4 C), temperature source Oral, resp. rate 16, height 6' (1.829 m), weight 123.4 kg, SpO2 99 %.Body mass index is 36.89 kg/m.  General Appearance: Casual  Eye Contact:  Fair  Speech:  Normal Rate  Volume:  Increased  Mood:  Anxious, Depressed and Dysphoric  Affect:  Congruent  Thought Process:  Coherent and Descriptions of Associations: Intact  Orientation:  Full (Time, Place, and Person)  Thought Content:  Delusions, Obsessions and Rumination  Suicidal Thoughts:  Yes.  without intent/plan  Homicidal Thoughts:  Yes.  without intent/plan  Memory:   Immediate;   Fair Recent;   Fair Remote;   Fair  Judgement:  Intact  Insight:  Fair  Psychomotor Activity:  Increased  Concentration:  Concentration: Fair and Attention Span: Fair  Recall:  [employer] of Knowledge:  Fair  Language:  Fair  Akathisia:  Negative  Handed:  Right  AIMS (if indicated):     Assets:  Communication Skills Desire for Improvement Housing Leisure Time Physical Health Resilience Social Support  ADL's:  Intact  Cognition:  WNL  Sleep:  Number of Hours: 6.75     Treatment Plan Summary: Daily contact with patient to assess and evaluate symptoms and progress in treatment, Medication management and Plan : Patient is seen and examined.  Patient is a 42 year old male with the above-stated past psychiatric history who is seen in follow-up.  #1 bipolar disorder; depressed versus schizoaffective disorder; bipolar type versus major depression-still with significant depressive symptoms.  Increase fluvoxamine to 75 mg p.o. nightly for mood.  Continue Seroquel 200 mg p.o. nightly.  #2 anxiety-add Seroquel 25 mg p.o. twice daily as well as the 200 mg nightly for anxiety and mood stability.  Increase BuSpar to 15 mg p.o. 3 times daily, continue lorazepam 1 mg as needed agitation and anxiety.  #3 diabetes mellitus type 2-currently well controlled #4 disposition planning in progress  Sharma Covert, MD 04/08/2018, 12:33 PM

## 2018-04-09 MED ORDER — TRAZODONE HCL 50 MG PO TABS
50.0000 mg | ORAL_TABLET | Freq: Every evening | ORAL | Status: DC | PRN
  Administered 2018-04-09: 50 mg via ORAL
  Filled 2018-04-09: qty 1

## 2018-04-09 MED ORDER — QUETIAPINE FUMARATE 25 MG PO TABS
25.0000 mg | ORAL_TABLET | Freq: Every day | ORAL | Status: DC
  Administered 2018-04-10 – 2018-04-15 (×6): 25 mg via ORAL
  Filled 2018-04-09 (×8): qty 1

## 2018-04-09 NOTE — [facility] Group Notes (Signed)
LCSW Group Therapy Note 04/09/2018 11:44 AM  Type of Therapy/Topic: Group Therapy: Emotion Regulation  Participation Level: Active   Description of Group:  The purpose of this group is to assist patients in learning to regulate negative emotions and experience positive emotions. Patients will be guided to discuss ways in which they have been vulnerable to their negative emotions. These vulnerabilities will be juxtaposed with experiences of positive emotions or situations, and patients will be challenged to use positive emotions to combat negative ones. Special emphasis will be placed on coping with negative emotions in conflict situations, and patients will process healthy conflict resolution skills.  Therapeutic Goals: 1. Patient will identify two positive emotions or experiences to reflect on in order to balance out negative emotions 2. Patient will label two or more emotions that they find the most difficult to experience 3. Patient will demonstrate positive conflict resolution skills through discussion and/or role plays  Summary of Patient Progress:  Garan was engaged and participated throughout the group session. Bralon states that he struggles with apathy. Yitzchok reports that he often become jittery when he is getting upset and in the past he has struggled with managing his anger and outbursts.    Therapeutic Modalities:  Cognitive Behavioral Therapy Feelings Identification Dialectical Behavioral Therapy   Theresa Duty Clinical Social Worker

## 2018-04-09 NOTE — Progress Notes (Signed)
Recreation Therapy Notes  Date: 10.25.19 Time: 0930 Location: [address] Dayroom  Group Topic: Stress Management  Goal Area(s) Addresses:  Patient will verbalize importance of using healthy stress management.  Patient will identify positive emotions associated with healthy stress management.   Intervention: Stress Management  Activity :  Meditation. LRT introduced the stress management technique of meditation.  LRT played a meditation for patients to engage in a meditation on gratitude.  Patients were to listen and follow along as the meditation was played.  Education:  Stress Management, Discharge Planning.   Education Outcome: Acknowledges edcuation/In group clarification offered/Needs additional education  Clinical Observations/Feedback:  Pt did not attend group.    Victorino Sparrow, LRT/CTRS         Victorino Sparrow A 04/09/2018 11:48 AM

## 2018-04-09 NOTE — Progress Notes (Signed)
[facility] MD Progress Note  04/09/2018 12:08 PM Marc Long  MRN:  [id number] Subjective:  Marc Long is seen and examined. Patient is a 42 year old male with a past psychiatric history significant for major depression versus bipolar disorder; depressed. Patient presented to the [facility] emergency department voluntarily by way of his mother secondary to a reported overdose. The patient stated he had ingested 21 Vistaril, 6 Seroquel, 3 trazodone, and 1 melatonin at approximately 11 PM. Patient stated that after taking the pills he spoke to his mother, and admitted that he did it so she could bring him to the hospital. He made himself throw up prior to coming to the hospital. He stated that he was significantly depressed. He admitted that he had previously attempted to harm himself in the past. He denied any auditory, visual or tactile hallucinations. No homicidal ideation. He is followed by [facility] and has a therapist. He was last hospitalized at [facility] on 12/10/2017. He was hospitalized at that time because of suicidal ideation depression. He was transferred to the [facility] for ECT. He received 4 treatments at that time. He was then discharged for outpatient treatment. He stated that he is still currently depressed. He stated that he went to [facility] earlier this week and they increased his fluoxetine to 60 mg p.o. daily. He stated he was unable to continue outpatient ECT because of financial reasons. He stated that he continues to have intrusive thoughts which makes him want to harm himself. He also stated that these intrusive thoughts that started when he was a teenager when he felt as though he had to control thoughts and not to "rape people". His intrusive thoughts now are to injure himself. He stated that occasionally he will look for a pen or pencil to stab to his hand. He sometimes goes in the bathroom and hits his hand violently to control these urges.  He denied any other compulsive or obsessive behaviors. He was admitted to the hospital for evaluation and stabilization.  Objective: Patient is seen and examined.  Patient is a 42 year old male with a previous psychiatric history significant for bipolar disorder; depressed versus major depression versus concern for schizoaffective disorder or potentially schizoid personality disorder.  He is seen in follow-up.  He stated he is tired this morning.  He did sleep well with the increased dose of Luvox, but also received Seroquel and trazodone.  He stated he had some intrusive thoughts last night, but none this morning.  That may be due to secondary oversedation.  He stated he had not had any suicidal thoughts this morning, and last night they were fleeting.  No other changes in his current condition.  His vital signs are stable, he slept well.  No new laboratories.  Principal Problem: <principal problem not specified> Diagnosis:   Patient Active Problem List   Diagnosis Date Noted  . Schizophrenia ([place]) [F20.9] 04/06/2018  . Severe recurrent major depression without psychotic features ([place]) [F33.2] 12/16/2017  . MDD (major depressive disorder), recurrent episode, severe ([place]) [F33.2] 12/10/2017  . Obesity (BMI 30-39.9) [E66.9] 12/09/2017  . Severe bipolar I disorder, most recent episode depressed ([place]) [F31.4]   . DKA (diabetic ketoacidoses) ([place]) [E11.10] 12/05/2017  . Hypertriglyceridemia [E78.1] 02/05/2017  . DM (diabetes mellitus), type 2, uncontrolled ([place]) [E11.65] 12/12/2016  . Dehydration [E86.0] 12/12/2016  . Suicidal ideations [R45.851] 12/12/2016  . Bipolar disorder (manic depression) ([place]) [F31.9] 12/12/2016  . Hyperglycemia due to type 2 diabetes mellitus ([place]) [E11.65] 12/12/2016  .  AKI (acute kidney injury) ([place]) [N17.9] 12/12/2016  . DKA, type 2, not at goal [facility]) [E11.10] 12/12/2016  . Obesity, Class III, BMI 40-49.9 (morbid obesity) ([place]) [E66.01]   . Type 2 diabetes mellitus  without complication, without long-term current use of insulin ([place]) [E11.9] 12/10/2016  . Lithium use [Z79.899] 12/10/2016  . Moderate single current episode of major depressive disorder ([place]) [F32.1] 04/15/2016  . Pain in joint, shoulder region [M25.519] 04/14/2016  . Diverticulosis of large intestine without hemorrhage [K57.30] 11/07/2015  . Calculus of gallbladder without cholecystitis [K80.20] 11/07/2015   Total Time spent with patient: 15 minutes  Past Psychiatric History: See admission H&P  Past Medical History:  Past Medical History:  Diagnosis Date  . Depression   . Diabetes mellitus without complication ([place])   . Gallstones   . Obesity, Class III, BMI 40-49.9 (morbid obesity) ([place])    History reviewed. No pertinent surgical history. Family History:  Family History  Problem Relation Age of Onset  . Hypertension Mother   . Depression Mother   . Heart attack Maternal Grandfather   . Lymphoma Maternal Grandmother   . Diabetes Maternal Aunt   . Cancer Maternal Aunt    Family Psychiatric  History: See admission H&P Social History:  Social History   Substance and Sexual Activity  Alcohol Use Yes  . Alcohol/week: 0.0 standard drinks   Comment: once every 2 weeks. Wine coolers     Social History   Substance and Sexual Activity  Drug Use No    Social History   Socioeconomic History  . Marital status: Single    Spouse name: Not on file  . Number of children: 0  . Years of education: Not on file  . Highest education level: Not on file  Occupational History  . Occupation: call center  Social Needs  . Financial resource strain: Not on file  . Food insecurity:    Worry: Not on file    Inability: Not on file  . Transportation needs:    Medical: Not on file    Non-medical: Not on file  Tobacco Use  . Smoking status: Never Smoker  . Smokeless tobacco: Never Used  Substance and Sexual Activity  . Alcohol use: Yes    Alcohol/week: 0.0 standard drinks     Comment: once every 2 weeks. Wine coolers  . Drug use: No  . Sexual activity: Not Currently    Birth control/protection: None  Lifestyle  . Physical activity:    Days per week: Not on file    Minutes per session: Not on file  . Stress: Not on file  Relationships  . Social connections:    Talks on phone: Not on file    Gets together: Not on file    Attends religious service: Not on file    Active member of club or organization: Not on file    Attends meetings of clubs or organizations: Not on file    Relationship status: Not on file  Other Topics Concern  . Not on file  Social History Narrative  . Not on file   Additional Social History:                         Sleep: Good  Appetite:  Fair  Current Medications: Current Facility-Administered Medications  Medication Dose Route Frequency Provider Last Rate Last Dose  . acetaminophen (TYLENOL) tablet 650 mg  650 mg Oral Q4H PRN Suella Broad, FNP      .  alum & mag hydroxide-simeth (MAALOX/MYLANTA) 200-200-20 MG/5ML suspension 30 mL  30 mL Oral Q4H PRN Starkes-Perry, Gayland Curry, FNP      . busPIRone (BUSPAR) tablet 15 mg  15 mg Oral TID Sharma Covert, MD   15 mg at 04/09/18 0757  . fluvoxaMINE (LUVOX) tablet 75 mg  75 mg Oral QHS Sharma Covert, MD   75 mg at 04/08/18 2117  . magnesium hydroxide (MILK OF MAGNESIA) suspension 30 mL  30 mL Oral Daily PRN Suella Broad, FNP      . metFORMIN (GLUCOPHAGE) tablet 1,000 mg  1,000 mg Oral BID WC Suella Broad, FNP   1,000 mg at 04/09/18 0757  . multivitamin with minerals tablet 1 tablet  1 tablet Oral Daily Suella Broad, FNP   1 tablet at 04/09/18 0757  . ondansetron (ZOFRAN) tablet 4 mg  4 mg Oral Q8H PRN Suella Broad, FNP      . pantoprazole (PROTONIX) EC tablet 40 mg  40 mg Oral Daily Suella Broad, FNP   40 mg at 04/09/18 0757  . QUEtiapine (SEROQUEL) tablet 200 mg  200 mg Oral QHS Sharma Covert, MD   200 mg  at 04/08/18 2117  . [START ON 04/10/2018] QUEtiapine (SEROQUEL) tablet 25 mg  25 mg Oral Daily Sharma Covert, MD      . risperiDONE (RISPERDAL M-TABS) disintegrating tablet 2 mg  2 mg Oral Q8H PRN Suella Broad, FNP   2 mg at 04/08/18 1018   And  . ziprasidone (GEODON) injection 20 mg  20 mg Intramuscular PRN Starkes-Perry, Gayland Curry, FNP      . traZODone (DESYREL) tablet 50 mg  50 mg Oral QHS PRN Sharma Covert, MD        Lab Results: No results found for this or any previous visit (from the past 48 hour(s)).  Blood Alcohol level:  Lab Results  Component Value Date   ETH <10 04/06/2018   ETH <10 95/02/3266    Metabolic Disorder Labs: Lab Results  Component Value Date   HGBA1C 5.6 04/07/2018   MPG 114.02 04/07/2018   MPG 432.59 12/09/2017   Lab Results  Component Value Date   PROLACTIN 16.1 (H) 04/07/2018   Lab Results  Component Value Date   CHOL 172 04/07/2018   TRIG 186 (H) 04/07/2018   HDL 31 (L) 04/07/2018   CHOLHDL 5.5 04/07/2018   VLDL 37 04/07/2018   LDLCALC 104 (H) 04/07/2018   LDLCALC 85 03/05/2017    Physical Findings: AIMS: Facial and Oral Movements Muscles of Facial Expression: None, normal Lips and Perioral Area: None, normal Jaw: None, normal Tongue: None, normal,Extremity Movements Upper (arms, wrists, hands, fingers): None, normal Lower (legs, knees, ankles, toes): None, normal, Trunk Movements Neck, shoulders, hips: None, normal, Overall Severity Severity of abnormal movements (highest score from questions above): None, normal Incapacitation due to abnormal movements: None, normal Patient's awareness of abnormal movements (rate only patient's report): No Awareness, Dental Status Current problems with teeth and/or dentures?: No Does patient usually wear dentures?: No  CIWA:  CIWA-Ar Total: 1 COWS:  COWS Total Score: 2  Musculoskeletal: Strength & Muscle Tone: within normal limits Gait & Station: normal Patient leans:  N/A  Psychiatric Specialty Exam: Physical Exam  Nursing note and vitals reviewed. Constitutional: He is oriented to person, place, and time. He appears well-developed and well-nourished.  HENT:  Head: Normocephalic and atraumatic.  Respiratory: Effort normal.  Neurological: He is alert and oriented  to person, place, and time.    ROS  Blood pressure 112/69, pulse 99, temperature 97.7 F (36.5 C), temperature source Oral, resp. rate 16, height 6' (1.829 m), weight 123.4 kg, SpO2 99 %.Body mass index is 36.89 kg/m.  General Appearance: Disheveled  Eye Contact:  Fair  Speech:  Sedated  Volume:  Decreased  Mood:  Dysphoric  Affect:  Congruent  Thought Process:  Coherent and Descriptions of Associations: Intact  Orientation:  Full (Time, Place, and Person)  Thought Content:  Logical  Suicidal Thoughts:  Yes.  without intent/plan  Homicidal Thoughts:  No  Memory:  Immediate;   Fair Recent;   Fair Remote;   Fair  Judgement:  Intact  Insight:  Fair  Psychomotor Activity:  Psychomotor Retardation  Concentration:  Concentration: Fair and Attention Span: Fair  Recall:  [employer] of Knowledge:  Fair  Language:  Fair  Akathisia:  Negative  Handed:  Right  AIMS (if indicated):     Assets:  Communication Skills Desire for Improvement Financial Resources/Insurance Housing Physical Health Resilience Social Support  ADL's:  Intact  Cognition:  WNL  Sleep:  Number of Hours: 6.75     Treatment Plan Summary: Daily contact with patient to assess and evaluate symptoms and progress in treatment, Medication management and Plan : Patient is seen and examined.  Patient is a 42 year old male with the above-stated past psychiatric history who is seen in follow-up.  #1 bipolar disorder; depressed versus schizoaffective disorder; bipolar type versus major depression plus or minus skis avoid personality disorder-he had less intrusive thoughts yesterday, but is oversedated today.  We will  continue the fluvoxamine 75 mg p.o. nightly, and as well continue Seroquel 200 mg p.o. nightly.  I will reduce his daytime Seroquel to just 25 mg p.o. daily.  Additionally I will reduce his trazodone to 50 mg p.o. nightly as needed.  #2 anxiety-continue fluvoxamine 75 mg p.o. nightly, continue Seroquel 25 mg p.o. daily.  Continue BuSpar 15 mg p.o. 3 times daily, continue lorazepam 1 mg p.o. as needed agitation.  #3 diabetes mellitus type 2-currently well controlled #4 disposition planning-in progress.  Sharma Covert, MD 04/09/2018, 12:08 PM

## 2018-04-09 NOTE — Plan of Care (Signed)
Progress Note  D: Pt found in bed; compliant with medication administration. Pt states he slept well. Pt rates his depression/hopelessness/anxiety 7/5/0 out of 10 respectively. Pt denies any physical symptoms or pain, rating his pain a 0/10. Pt states his goal for today is to get more info as to whether or not he is getting ECT and will achieve this by talking to the doctor. Pt states he's been having suicidal thoughts today but denies having a plan. Pt denies any hi/ah/vh and verbally agrees to approach staff if these become apparent or before harming himself while at [facility]. A: pt provided support and encouragement. Pt given medications per protocol and standing orders. Q26m safety checks implemented and continued. R: pt safe on the unit. Will continue to monitor.   Pt progressing in the following metrics  Problem: Education: Goal: Knowledge of  General Education information/materials will improve Outcome: Progressing Goal: Mental status will improve Outcome: Progressing Goal: Verbalization of understanding the information provided will improve Outcome: Progressing   Problem: Coping: Goal: Ability to verbalize frustrations and anger appropriately will improve Outcome: Progressing Goal: Ability to demonstrate self-control will improve Outcome: Progressing   Problem: Health Behavior/Discharge Planning: Goal: Compliance with treatment plan for underlying cause of condition will improve Outcome: Progressing

## 2018-04-10 MED ORDER — TRAZODONE HCL 150 MG PO TABS
150.0000 mg | ORAL_TABLET | Freq: Every evening | ORAL | Status: DC | PRN
  Administered 2018-04-10 – 2018-04-12 (×4): 150 mg via ORAL
  Filled 2018-04-10 (×12): qty 1

## 2018-04-10 MED ORDER — CHLORPROMAZINE HCL 50 MG PO TABS
50.0000 mg | ORAL_TABLET | Freq: Once | ORAL | Status: AC
  Administered 2018-04-10: 50 mg via ORAL
  Filled 2018-04-10: qty 1
  Filled 2018-04-10: qty 2

## 2018-04-10 MED ORDER — FLUVOXAMINE MALEATE 100 MG PO TABS
100.0000 mg | ORAL_TABLET | Freq: Every day | ORAL | Status: DC
  Administered 2018-04-10 – 2018-04-11 (×2): 100 mg via ORAL
  Filled 2018-04-10: qty 2
  Filled 2018-04-10 (×4): qty 1

## 2018-04-10 MED ORDER — HYDROXYZINE HCL 50 MG PO TABS
50.0000 mg | ORAL_TABLET | Freq: Four times a day (QID) | ORAL | Status: DC | PRN
  Administered 2018-04-10 – 2018-04-18 (×7): 50 mg via ORAL
  Filled 2018-04-10 (×6): qty 1

## 2018-04-10 NOTE — Plan of Care (Signed)
  Problem: Activity: Goal: Interest or engagement in activities will improve Outcome: Progressing Note:  Pt reported attending evening wrap up group as well as two groups on day shift.

## 2018-04-10 NOTE — Progress Notes (Signed)
The patient shared with the group last evening that he had experienced a "crappy day". He explained that he had thought about suicide 27 times over the past hour. His goal for tomorrow is to have fewer suicidal thoughts.

## 2018-04-10 NOTE — Progress Notes (Signed)
D. Pt observed interacting appropriately in the dayroom with peers- has been calm and cooperative. Per pt's self inventory, pt rates his depression, hopelessness and anxiety a 5,7,5, respectively. Pt writes that his most important goal today is "having less intrusive thoughts than yesterday" and "reset when I need to ".  Pt currently endorses passive SI, but verbally contracts for safety A. Labs and vitals monitored. Pt compliant with medications. Pt supported emotionally and encouraged to express concerns and ask questions.   R. Pt remains safe with 15 minute checks. Will continue POC.

## 2018-04-10 NOTE — Progress Notes (Deleted)
Adult Psychoeducational Group Note  Date:  04/10/2018 Time:  8:30AM-9:15AM  Group Topic/Focus:  Goals Group:   The focus of this group is to help patients establish daily goals to achieve during treatment and discuss how the patient can incorporate goal setting into their daily lives to aide in recovery.  Participation Level:  Active  Participation Quality:  Attentive  Affect:  Appropriate  Cognitive:  Alert  Insight: Good  Engagement in Group:  Engaged  Modes of Intervention:  Discussion  Additional Comments: Patient informed group that his goal is to feel like he felt the previous day. MHT gathered information from the patient regarding how he was feeling the previous day in addition to what contributed to his improved mood. Patient informed group that he was not sure what helped his mood the previous day, but expressed feeling like he knew he would be alright. Patient informed the group that he planned to see what the day brings as a way to help him to feel the same way as he did the day prior.   Doyle Askew 04/10/2018, 4:24 PM

## 2018-04-10 NOTE — Progress Notes (Signed)
Adult Psychoeducational Group Note  Date:  04/10/2018 Time: 8:30AM-9:15AM  Group Topic/Focus:  Goals Group:   The focus of this group is to help patients establish daily goals to achieve during treatment and discuss how the patient can incorporate goal setting into their daily lives to aide in recovery.  Participation Level:  Active  Participation Quality:  Attentive  Affect:  Appropriate  Cognitive:  Alert  Insight: Good  Engagement in Group:  Engaged  Modes of Intervention:  Discussion  Additional Comments:  Patient informed group that his goal for the day was to have less intrusive thoughts. MHT challenged patient to think about steps that he could take to assist him in decreasing the number of intrusive thoughts that he has. Patient expressed uncertainty towards this issue. MHT helped patient to think through steps he could take to be more present and reminded patient that he could talk to MHT and others when he is having intrusive thoughts and needs additional assistance to combat these thoughts. Patient was receptive of this recommendation.   Doyle Askew 04/10/2018, 4:19 PM

## 2018-04-10 NOTE — Progress Notes (Signed)
D:  Marc Long has been up and visible on the unit.  He has been in the day room much of the evening interacting with peers and watching TV.  Earlier in the shift, he came to RN and reported he wanted something for his intrusive thoughts of suicide.  "I the past hour I have thought of 27 different things that I can do to hurt myself." He was able to contract for safety on the unit.  PRN given for agitation but he stated he didn't think that it would work though and staffed with Marc Clan PA.  Marc Long allowed RN to give his hs medications earlier than 10pm.  He did come back 30 minutes after taking his medications stating he wasn't tired.  Encouraged him to try to lay down and let the medications work.  He is currently resting with his eyes closed and appears to be asleep. A:  1:1 with RN for support and encouragement.  Medications as ordered.  Q 15 minute checks maintained for safety.  Encouraged participation in group and unit activities.   R:  Marc Long remains safe on the unit.  We will continue to monitor the progress towards his goals.

## 2018-04-10 NOTE — Progress Notes (Signed)
[facility] MD Progress Note  04/10/2018 1:05 PM Marc Long  MRN:  [id number] Subjective:   Patient is seen and examined. Patient is a 42 year old male with a past psychiatric history significant for major depression versus bipolar disorder; depressed. Patient presented to the [facility] emergency department voluntarily by way of his mother secondary to a reported overdose. The patient stated he had ingested 21 Vistaril, 6 Seroquel, 3 trazodone, and 1 melatonin at approximately 11 PM. Patient stated that after taking the pills he spoke to his mother, and admitted that he did it so she could bring him to the hospital. He made himself throw up prior to coming to the hospital. He stated that he was significantly depressed. He admitted that he had previously attempted to harm himself in the past. He denied any auditory, visual or tactile hallucinations. No homicidal ideation. He is followed by Marc Long and has a therapist. He was last hospitalized at [facility] on 12/10/2017. He was hospitalized at that time because of suicidal ideation depression. He was transferred to the [facility] for ECT. He received 4 treatments at that time. He was then discharged for outpatient treatment. He stated that he is still currently depressed. He stated that he went to [facility] earlier this week and they increased his fluoxetine to 60 mg p.o. daily. He stated he was unable to continue outpatient ECT because of financial reasons. He stated that he continues to have intrusive thoughts which makes him want to harm himself. He also stated that these intrusive thoughts that started when he was a teenager when he felt as though he had to control thoughts and not to "rape people". His intrusive thoughts now are to injure himself. He stated that occasionally he will look for a pen or pencil to stab to his hand. He sometimes goes in the bathroom and hits his hand violently to control these urges.  He denied any other compulsive or obsessive behaviors. He was admitted to the hospital for evaluation and stabilization.  Objective: Patient is seen and examined.  Patient is a 42 year old male with a previous psychiatric history significant for bipolar disorder; depressed versus major depression versus concern for schizoaffective disorder or potentially schizoid personality disorder.  He also should have under consideration obsessive-compulsive disorder.  He is seen in follow-up.  He is doing better today.  He was sedated yesterday, but late in the day went to the nursing staff and stated he thought about how he could harm himself 37 different times during the day.  This morning he stated that the total number was 51.  He continues to sleep better with the combination of the Seroquel and Luvox.  He stated today's having a good time and only thought about harming himself on 3 occasions.  He discussed today the fact that he had his first murder his thought when he was approximately 42 years of age.  He remembers getting into a verbal argument with his father.  He stated his father laid down and fell asleep and he went to his bedroom and held a screwdriver over him and thought about killing his father at that time.  He changed his mind about that.  No other complaints today.  Principal Problem: <principal problem not specified> Diagnosis:   Patient Active Problem List   Diagnosis Date Noted  . Schizophrenia ([place]) [F20.9] 04/06/2018  . Severe recurrent major depression without psychotic features ([place]) [F33.2] 12/16/2017  . MDD (major depressive disorder), recurrent episode, severe ([place]) [F33.2]  12/10/2017  . Obesity (BMI 30-39.9) [E66.9] 12/09/2017  . Severe bipolar I disorder, most recent episode depressed ([place]) [F31.4]   . DKA (diabetic ketoacidoses) ([place]) [E11.10] 12/05/2017  . Hypertriglyceridemia [E78.1] 02/05/2017  . DM (diabetes mellitus), type 2, uncontrolled ([place]) [E11.65] 12/12/2016  .  Dehydration [E86.0] 12/12/2016  . Suicidal ideations [R45.851] 12/12/2016  . Bipolar disorder (manic depression) ([place]) [F31.9] 12/12/2016  . Hyperglycemia due to type 2 diabetes mellitus ([place]) [E11.65] 12/12/2016  . AKI (acute kidney injury) ([place]) [N17.9] 12/12/2016  . DKA, type 2, not at goal [facility]) [E11.10] 12/12/2016  . Obesity, Class III, BMI 40-49.9 (morbid obesity) ([place]) [E66.01]   . Type 2 diabetes mellitus without complication, without long-term current use of insulin ([place]) [E11.9] 12/10/2016  . Lithium use [Z79.899] 12/10/2016  . Moderate single current episode of major depressive disorder ([place]) [F32.1] 04/15/2016  . Pain in joint, shoulder region [M25.519] 04/14/2016  . Diverticulosis of large intestine without hemorrhage [K57.30] 11/07/2015  . Calculus of gallbladder without cholecystitis [K80.20] 11/07/2015   Total Time spent with patient: 15 minutes  Past Psychiatric History: See admission H&P  Past Medical History:  Past Medical History:  Diagnosis Date  . Depression   . Diabetes mellitus without complication ([place])   . Gallstones   . Obesity, Class III, BMI 40-49.9 (morbid obesity) ([place])    History reviewed. No pertinent surgical history. Family History:  Family History  Problem Relation Age of Onset  . Hypertension Mother   . Depression Mother   . Heart attack Maternal Grandfather   . Lymphoma Maternal Grandmother   . Diabetes Maternal Aunt   . Cancer Maternal Aunt    Family Psychiatric  History: See admission H&P Social History:  Social History   Substance and Sexual Activity  Alcohol Use Yes  . Alcohol/week: 0.0 standard drinks   Comment: once every 2 weeks. Wine coolers     Social History   Substance and Sexual Activity  Drug Use No    Social History   Socioeconomic History  . Marital status: Single    Spouse name: Not on file  . Number of children: 0  . Years of education: Not on file  . Highest education level: Not on file  Occupational  History  . Occupation: call center  Social Needs  . Financial resource strain: Not on file  . Food insecurity:    Worry: Not on file    Inability: Not on file  . Transportation needs:    Medical: Not on file    Non-medical: Not on file  Tobacco Use  . Smoking status: Never Smoker  . Smokeless tobacco: Never Used  Substance and Sexual Activity  . Alcohol use: Yes    Alcohol/week: 0.0 standard drinks    Comment: once every 2 weeks. Wine coolers  . Drug use: No  . Sexual activity: Not Currently    Birth control/protection: None  Lifestyle  . Physical activity:    Days per week: Not on file    Minutes per session: Not on file  . Stress: Not on file  Relationships  . Social connections:    Talks on phone: Not on file    Gets together: Not on file    Attends religious service: Not on file    Active member of club or organization: Not on file    Attends meetings of clubs or organizations: Not on file    Relationship status: Not on file  Other Topics Concern  . Not on file  Social History Narrative  . Not on file   Additional Social History:                         Sleep: Good  Appetite:  Fair  Current Medications: Current Facility-Administered Medications  Medication Dose Route Frequency Provider Last Rate Last Dose  . acetaminophen (TYLENOL) tablet 650 mg  650 mg Oral Q4H PRN Suella Broad, FNP      . alum & mag hydroxide-simeth (MAALOX/MYLANTA) 200-200-20 MG/5ML suspension 30 mL  30 mL Oral Q4H PRN Starkes-Perry, Gayland Curry, FNP      . busPIRone (BUSPAR) tablet 15 mg  15 mg Oral TID Sharma Covert, MD   15 mg at 04/10/18 1205  . fluvoxaMINE (LUVOX) tablet 100 mg  100 mg Oral QHS Sharma Covert, MD      . magnesium hydroxide (MILK OF MAGNESIA) suspension 30 mL  30 mL Oral Daily PRN Suella Broad, FNP      . metFORMIN (GLUCOPHAGE) tablet 1,000 mg  1,000 mg Oral BID WC Suella Broad, FNP   1,000 mg at 04/10/18 0835  .  multivitamin with minerals tablet 1 tablet  1 tablet Oral Daily Suella Broad, FNP   1 tablet at 04/10/18 0835  . ondansetron (ZOFRAN) tablet 4 mg  4 mg Oral Q8H PRN Suella Broad, FNP      . pantoprazole (PROTONIX) EC tablet 40 mg  40 mg Oral Daily Suella Broad, FNP   40 mg at 04/10/18 0835  . QUEtiapine (SEROQUEL) tablet 200 mg  200 mg Oral QHS Sharma Covert, MD   200 mg at 04/09/18 2056  . QUEtiapine (SEROQUEL) tablet 25 mg  25 mg Oral Daily Sharma Covert, MD   25 mg at 04/10/18 0835  . risperiDONE (RISPERDAL M-TABS) disintegrating tablet 2 mg  2 mg Oral Q8H PRN Suella Broad, FNP   2 mg at 04/09/18 2006   And  . ziprasidone (GEODON) injection 20 mg  20 mg Intramuscular PRN Starkes-Perry, Gayland Curry, FNP      . traZODone (DESYREL) tablet 50 mg  50 mg Oral QHS PRN Sharma Covert, MD   50 mg at 04/09/18 2056    Lab Results: No results found for this or any previous visit (from the past 48 hour(s)).  Blood Alcohol level:  Lab Results  Component Value Date   ETH <10 04/06/2018   ETH <10 08/65/7846    Metabolic Disorder Labs: Lab Results  Component Value Date   HGBA1C 5.6 04/07/2018   MPG 114.02 04/07/2018   MPG 432.59 12/09/2017   Lab Results  Component Value Date   PROLACTIN 16.1 (H) 04/07/2018   Lab Results  Component Value Date   CHOL 172 04/07/2018   TRIG 186 (H) 04/07/2018   HDL 31 (L) 04/07/2018   CHOLHDL 5.5 04/07/2018   VLDL 37 04/07/2018   LDLCALC 104 (H) 04/07/2018   LDLCALC 85 03/05/2017    Physical Findings: AIMS: Facial and Oral Movements Muscles of Facial Expression: None, normal Lips and Perioral Area: None, normal Jaw: None, normal Tongue: None, normal,Extremity Movements Upper (arms, wrists, hands, fingers): None, normal Lower (legs, knees, ankles, toes): None, normal, Trunk Movements Neck, shoulders, hips: None, normal, Overall Severity Severity of abnormal movements (highest score from questions  above): None, normal Incapacitation due to abnormal movements: None, normal Patient's awareness of abnormal movements (rate only patient's report): No Awareness, Dental Status Current problems with  teeth and/or dentures?: No Does patient usually wear dentures?: No  CIWA:  CIWA-Ar Total: 1 COWS:  COWS Total Score: 2  Musculoskeletal: Strength & Muscle Tone: within normal limits Gait & Station: normal Patient leans: N/A  Psychiatric Specialty Exam: Physical Exam  Nursing note and vitals reviewed. Constitutional: He is oriented to person, place, and time. He appears well-developed and well-nourished.  HENT:  Head: Normocephalic and atraumatic.  Respiratory: Effort normal.  Neurological: He is alert and oriented to person, place, and time.    ROS  Blood pressure 122/88, pulse 69, temperature 97.7 F (36.5 C), temperature source Oral, resp. rate 16, height 6' (1.829 m), weight 123.4 kg, SpO2 99 %.Body mass index is 36.89 kg/m.  General Appearance: Casual  Eye Contact:  Fair  Speech:  Normal Rate  Volume:  Normal  Mood:  Anxious and Dysphoric  Affect:  Congruent  Thought Process:  Coherent and Descriptions of Associations: Intact  Orientation:  Full (Time, Place, and Person)  Thought Content:  Obsessions and Rumination  Suicidal Thoughts:  Yes.  without intent/plan  Homicidal Thoughts:  No  Memory:  Immediate;   Fair Recent;   Fair Remote;   Fair  Judgement:  Impaired  Insight:  Fair  Psychomotor Activity:  Normal  Concentration:  Concentration: Fair and Attention Span: Fair  Recall:  [employer] of Knowledge:  Fair  Language:  Good  Akathisia:  Negative  Handed:  Right  AIMS (if indicated):     Assets:  Communication Skills Desire for Improvement Financial Resources/Insurance Housing Leisure Time Physical Health Resilience Social Support  ADL's:  Intact  Cognition:  WNL  Sleep:  Number of Hours: 6.75     Treatment Plan Summary: Daily contact with patient to  assess and evaluate symptoms and progress in treatment, Medication management and Plan : Patient is seen and examined.  Patient is a 42 year old male with the above-stated past psychiatric history who is seen in follow-up.  #1 bipolar disorder; depressed versus schizoaffective disorder; bipolar type versus major depression plus or minus schzoid personality disorder/OCD-patient stated he was having less suicidal thoughts today, less intrusive thoughts.  We will increase his Luvox 200 mg p.o. nightly.  We will continue Seroquel 200 mg p.o. nightly and Seroquel 25 mg p.o. daily.  Will continue his trazodone at 50 mg p.o. nightly as needed.  #2 anxiety-continue Luvox amine at the 100 mg p.o. nightly dosage, continue Seroquel 25 mg p.o. daily, continue BuSpar 15 mg p.o. 3 times daily, continue lorazepam 1 mg p.o. as needed agitation.  #3 diabetes mellitus type 2-currently stable.  #4 disposition planning-in progress  Sharma Covert, MD 04/10/2018, 1:05 PM

## 2018-04-10 NOTE — [facility] Group Notes (Signed)
LCSW Group Therapy Note  04/10/2018   10:00-11:00am   Type of Therapy and Topic:  Group Therapy: Anger Cues and Responses  Participation Level:  Active   Description of Group:   In this group, patients learned how to recognize the physical, cognitive, emotional, and behavioral responses they have to anger-provoking situations.  They identified a recent time they became angry and how they reacted.  They analyzed how their reaction was possibly beneficial and how it was possibly unhelpful.  The group discussed a variety of healthier coping skills that could help with such a situation in the future.  Deep breathing was practiced briefly.  Therapeutic Goals: 1. Patients will remember their last incident of anger and how they felt emotionally and physically, what their thoughts were at the time, and how they behaved. 2. Patients will identify how their behavior at that time worked for them, as well as how it worked against them. 3. Patients will explore possible new behaviors to use in future anger situations. 4. Patients will learn that anger itself is normal and cannot be eliminated, and that healthier reactions can assist with resolving conflict rather than worsening situations.  Summary of Patient Progress:  The patient shared that his most recent time of anger was yesterday and said he was annoyed because he did not get some medication yesterday that he wanted that would have made him sleep instead of suffering through his feelings.  He had thoughts about hurting himself, stabbing himself with a pencil for instance, but chose not to do that.  He continues to examine the situation and his feelings and is showing growth in his insight.  Therapeutic Modalities:   Cognitive Behavioral Therapy  Maretta Los

## 2018-04-11 MED ORDER — LISINOPRIL 5 MG PO TABS
5.0000 mg | ORAL_TABLET | Freq: Every day | ORAL | Status: DC
  Administered 2018-04-11 – 2018-04-19 (×9): 5 mg via ORAL
  Filled 2018-04-11 (×12): qty 1

## 2018-04-11 NOTE — Progress Notes (Signed)
[facility] MD Progress Note  04/11/2018 10:31 AM Marc Long  MRN:  [id number] Subjective:   Patient is seen and examined. Patient is a 42 year old male with a past psychiatric history significant for major depression versus bipolar disorder; depressed. Patient presented to the [facility] emergency department voluntarily by way of his mother secondary to a reported overdose. The patient stated he had ingested 21 Vistaril, 6 Seroquel, 3 trazodone, and 1 melatonin at approximately 11 PM. Patient stated that after taking the pills he spoke to his mother, and admitted that he did it so she could bring him to the hospital. He made himself throw up prior to coming to the hospital. He stated that he was significantly depressed. He admitted that he had previously attempted to harm himself in the past. He denied any auditory, visual or tactile hallucinations. No homicidal ideation. He is followed by [facility] and has a therapist. He was last hospitalized at [facility] on 12/10/2017. He was hospitalized at that time because of suicidal ideation depression. He was transferred to the [facility] for ECT. He received 4 treatments at that time. He was then discharged for outpatient treatment. He stated that he is still currently depressed. He stated that he went to [facility] earlier this week and they increased his fluoxetine to 60 mg p.o. daily. He stated he was unable to continue outpatient ECT because of financial reasons. He stated that he continues to have intrusive thoughts which makes him want to harm himself. He also stated that these intrusive thoughts that started when he was a teenager when he felt as though he had to control thoughts and not to "rape people". His intrusive thoughts now are to injure himself. He stated that occasionally he will look for a pen or pencil to stab to his hand. He sometimes goes in the bathroom and hits his hand violently to control these  urges. He denied any other compulsive or obsessive behaviors. He was admitted to the hospital for evaluation and stabilization.  Patient is seen and examined.  Patient is a 41 year old male with a past psychiatric history significant for depression, anxiety, probable schizoaffective disorder or potential schizoid personality disorder.  Also under consideration is obsessive-compulsive disorder.  He is seen in follow-up.  Actually said he felt a little bit better today.  He stated this morning his suicidal thoughts a decreased.  He stated mood had improved, but "it is early in the day".  He continues to sleep well with the combination of Seroquel and Luvox.  He denied any side effects to the Luvox.  He also stated he is having less intrusive thoughts this morning.  His blood pressure remains elevated at 139/97 and 145/88.  His heart rate is between 67 and 89.  Principal Problem: <principal problem not specified> Diagnosis:   Patient Active Problem List   Diagnosis Date Noted  . Schizophrenia ([place]) [F20.9] 04/06/2018  . Severe recurrent major depression without psychotic features ([place]) [F33.2] 12/16/2017  . MDD (major depressive disorder), recurrent episode, severe ([place]) [F33.2] 12/10/2017  . Obesity (BMI 30-39.9) [E66.9] 12/09/2017  . Severe bipolar I disorder, most recent episode depressed ([place]) [F31.4]   . DKA (diabetic ketoacidoses) ([place]) [E11.10] 12/05/2017  . Hypertriglyceridemia [E78.1] 02/05/2017  . DM (diabetes mellitus), type 2, uncontrolled ([place]) [E11.65] 12/12/2016  . Dehydration [E86.0] 12/12/2016  . Suicidal ideations [R45.851] 12/12/2016  . Bipolar disorder (manic depression) ([place]) [F31.9] 12/12/2016  . Hyperglycemia due to type 2 diabetes mellitus ([place]) [E11.65] 12/12/2016  .  AKI (acute kidney injury) ([place]) [N17.9] 12/12/2016  . DKA, type 2, not at goal [facility]) [E11.10] 12/12/2016  . Obesity, Class III, BMI 40-49.9 (morbid obesity) ([place]) [E66.01]   . Type 2 diabetes mellitus  without complication, without long-term current use of insulin ([place]) [E11.9] 12/10/2016  . Lithium use [Z79.899] 12/10/2016  . Moderate single current episode of major depressive disorder ([place]) [F32.1] 04/15/2016  . Pain in joint, shoulder region [M25.519] 04/14/2016  . Diverticulosis of large intestine without hemorrhage [K57.30] 11/07/2015  . Calculus of gallbladder without cholecystitis [K80.20] 11/07/2015   Total Time spent with patient: 20 minutes  Past Psychiatric History: See admission H&P  Past Medical History:  Past Medical History:  Diagnosis Date  . Depression   . Diabetes mellitus without complication ([place])   . Gallstones   . Obesity, Class III, BMI 40-49.9 (morbid obesity) ([place])    History reviewed. No pertinent surgical history. Family History:  Family History  Problem Relation Age of Onset  . Hypertension Mother   . Depression Mother   . Heart attack Maternal Grandfather   . Lymphoma Maternal Grandmother   . Diabetes Maternal Aunt   . Cancer Maternal Aunt    Family Psychiatric  History: See admission H&P Social History:  Social History   Substance and Sexual Activity  Alcohol Use Yes  . Alcohol/week: 0.0 standard drinks   Comment: once every 2 weeks. Wine coolers     Social History   Substance and Sexual Activity  Drug Use No    Social History   Socioeconomic History  . Marital status: Single    Spouse name: Not on file  . Number of children: 0  . Years of education: Not on file  . Highest education level: Not on file  Occupational History  . Occupation: call center  Social Needs  . Financial resource strain: Not on file  . Food insecurity:    Worry: Not on file    Inability: Not on file  . Transportation needs:    Medical: Not on file    Non-medical: Not on file  Tobacco Use  . Smoking status: Never Smoker  . Smokeless tobacco: Never Used  Substance and Sexual Activity  . Alcohol use: Yes    Alcohol/week: 0.0 standard drinks     Comment: once every 2 weeks. Wine coolers  . Drug use: No  . Sexual activity: Not Currently    Birth control/protection: None  Lifestyle  . Physical activity:    Days per week: Not on file    Minutes per session: Not on file  . Stress: Not on file  Relationships  . Social connections:    Talks on phone: Not on file    Gets together: Not on file    Attends religious service: Not on file    Active member of club or organization: Not on file    Attends meetings of clubs or organizations: Not on file    Relationship status: Not on file  Other Topics Concern  . Not on file  Social History Narrative  . Not on file   Additional Social History:                         Sleep: Good  Appetite:  Fair  Current Medications: Current Facility-Administered Medications  Medication Dose Route Frequency Provider Last Rate Last Dose  . acetaminophen (TYLENOL) tablet 650 mg  650 mg Oral Q4H PRN Suella Broad, FNP      .  alum & mag hydroxide-simeth (MAALOX/MYLANTA) 200-200-20 MG/5ML suspension 30 mL  30 mL Oral Q4H PRN Starkes-Perry, Gayland Curry, FNP      . busPIRone (BUSPAR) tablet 15 mg  15 mg Oral TID Sharma Covert, MD   15 mg at 04/11/18 7322  . fluvoxaMINE (LUVOX) tablet 100 mg  100 mg Oral QHS Sharma Covert, MD   100 mg at 04/10/18 2054  . hydrOXYzine (ATARAX/VISTARIL) tablet 50 mg  50 mg Oral Q6H PRN Laverle Hobby, PA-C   50 mg at 04/10/18 2054  . magnesium hydroxide (MILK OF MAGNESIA) suspension 30 mL  30 mL Oral Daily PRN Suella Broad, FNP      . metFORMIN (GLUCOPHAGE) tablet 1,000 mg  1,000 mg Oral BID WC Suella Broad, FNP   1,000 mg at 04/11/18 0852  . multivitamin with minerals tablet 1 tablet  1 tablet Oral Daily Suella Broad, FNP   1 tablet at 04/11/18 0852  . ondansetron (ZOFRAN) tablet 4 mg  4 mg Oral Q8H PRN Suella Broad, FNP      . pantoprazole (PROTONIX) EC tablet 40 mg  40 mg Oral Daily Suella Broad,  FNP   40 mg at 04/11/18 0852  . QUEtiapine (SEROQUEL) tablet 25 mg  25 mg Oral Daily Sharma Covert, MD   25 mg at 04/11/18 0254  . risperiDONE (RISPERDAL M-TABS) disintegrating tablet 2 mg  2 mg Oral Q8H PRN Suella Broad, FNP   2 mg at 04/09/18 2006   And  . ziprasidone (GEODON) injection 20 mg  20 mg Intramuscular PRN Suella Broad, FNP      . traZODone (DESYREL) tablet 150 mg  150 mg Oral QHS,MR X 1 Laverle Hobby, PA-C   Stopped at 04/11/18 0200    Lab Results: No results found for this or any previous visit (from the past 48 hour(s)).  Blood Alcohol level:  Lab Results  Component Value Date   ETH <10 04/06/2018   ETH <10 27/11/2374    Metabolic Disorder Labs: Lab Results  Component Value Date   HGBA1C 5.6 04/07/2018   MPG 114.02 04/07/2018   MPG 432.59 12/09/2017   Lab Results  Component Value Date   PROLACTIN 16.1 (H) 04/07/2018   Lab Results  Component Value Date   CHOL 172 04/07/2018   TRIG 186 (H) 04/07/2018   HDL 31 (L) 04/07/2018   CHOLHDL 5.5 04/07/2018   VLDL 37 04/07/2018   LDLCALC 104 (H) 04/07/2018   LDLCALC 85 03/05/2017    Physical Findings: AIMS: Facial and Oral Movements Muscles of Facial Expression: None, normal Lips and Perioral Area: None, normal Jaw: None, normal Tongue: None, normal,Extremity Movements Upper (arms, wrists, hands, fingers): None, normal Lower (legs, knees, ankles, toes): None, normal, Trunk Movements Neck, shoulders, hips: None, normal, Overall Severity Severity of abnormal movements (highest score from questions above): None, normal Incapacitation due to abnormal movements: None, normal Patient's awareness of abnormal movements (rate only patient's report): No Awareness, Dental Status Current problems with teeth and/or dentures?: No Does patient usually wear dentures?: No  CIWA:  CIWA-Ar Total: 2 COWS:  COWS Total Score: 0  Musculoskeletal: Strength & Muscle Tone: within normal limits Gait &  Station: normal Patient leans: N/A  Psychiatric Specialty Exam: Physical Exam  Nursing note and vitals reviewed. Constitutional: He is oriented to person, place, and time. He appears well-developed and well-nourished.  HENT:  Head: Normocephalic and atraumatic.  Respiratory: Effort normal.  Neurological: He  is alert and oriented to person, place, and time.    ROS  Blood pressure (!) 145/88, pulse 89, temperature 98.5 F (36.9 C), temperature source Oral, resp. rate 16, height 6' (1.829 m), weight 123.4 kg, SpO2 99 %.Body mass index is 36.89 kg/m.  General Appearance: Casual  Eye Contact:  Fair  Speech:  Normal Rate  Volume:  Normal  Mood:  Anxious  Affect:  Congruent  Thought Process:  Coherent and Descriptions of Associations: Intact  Orientation:  Full (Time, Place, and Person)  Thought Content:  Logical  Suicidal Thoughts:  Yes.  without intent/plan  Homicidal Thoughts:  No  Memory:  Immediate;   Fair Recent;   Fair Remote;   Fair  Judgement:  Intact  Insight:  Fair  Psychomotor Activity:  Increased  Concentration:  Concentration: Fair and Attention Span: Fair  Recall:  [employer] of Knowledge:  Fair  Language:  Fair  Akathisia:  Negative  Handed:  Right  AIMS (if indicated):     Assets:  Communication Skills Desire for Improvement Financial Resources/Insurance Housing Physical Health Resilience Social Support  ADL's:  Intact  Cognition:  WNL  Sleep:  Number of Hours: 6.5     Treatment Plan Summary: Daily contact with patient to assess and evaluate symptoms and progress in treatment, Medication management and Plan : Patient is seen and examined.  Patient is a 42 year old male with the above-stated past psychiatric history who is seen in follow-up.  #1 schizoaffective disorder bipolar type, schizoid personality disorder/OCD-he is doing little bit better today.  We will continue his Luvox at 100 mg p.o. nightly.  We will continue Seroquel 200 mg p.o. nightly and  Seroquel 25 mg p.o. daily.  We will also continue his trazodone 50 mg p.o. nightly as needed.  #2 anxiety-continue BuSpar 15 mg p.o. 3 times daily, continue lorazepam 1 mg p.o. as needed agitation.  #3 diabetes mellitus type 2-stable.  #4 hypertension-given his diabetes I will add lisinopril 5 mg p.o. daily and titrate.  #6 disposition planning-in progress.  Sharma Covert, MD 04/11/2018, 10:31 AM

## 2018-04-11 NOTE — BHH Group Notes (Signed)
[place] Group Notes:  (Nursing/MHT/Case Management/Adjunct)  Date:  04/11/2018  Time:  6:25 PM  Type of Therapy:  Nurse Education  Participation Level:  Active  Participation Quality:  Appropriate and Attentive  Affect:  Appropriate  Cognitive:  Appropriate  Insight:  Good  Engagement in Group:  Engaged  Modes of Intervention:  Discussion and Education  Summary of Progress/Problems: RN reviewed healthy coping skills and positive self image.   Lesli Albee 04/11/2018, 6:25 PM

## 2018-04-11 NOTE — Progress Notes (Signed)
D: Pt was in his room with visitor upon initial approach.  Pt presents with depressed, anxious affect and mood.  He describes his day as "a five and a half.  It's better than it was before."  H is goal is to "write some in my journal" and pt reports he has not met goal yet.  He reports he had a good visit with his roommate tonight.  Pt denies SI/HI, denies hallucinations, denies pain.  Pt has been visible in milieu interacting with peers and staff appropriately.  Pt attended evening group.    A: Introduced self to pt.  Met with pt 1:1.  Actively listened to pt and offered support and encouragement. Medications administered per order.  PRN medication administered for anxiety.  Q15 minute safety checks maintained.  R: Pt is safe on the unit.  Pt is compliant with medications.  Pt verbally contracts for safety and reports he will inform staff of needs and concerns.  Will continue to monitor and assess.

## 2018-04-11 NOTE — Progress Notes (Signed)
Adult Psychoeducational Group Note  Date:  04/11/2018 Time:  11:23 PM  Group Topic/Focus:  Wrap-Up Group:   The focus of this group is to help patients review their daily goal of treatment and discuss progress on daily workbooks.  Participation Level:  Active  Participation Quality:  Appropriate  Affect:  Appropriate  Cognitive:  Appropriate  Insight: Appropriate  Engagement in Group:  Engaged  Modes of Intervention:  Discussion  Additional Comments:  Pt's goal was to write more in journal but that goals was not met.  Pt rated the day at a 6/10.  Galia Rahm 04/11/2018, 11:23 PM

## 2018-04-11 NOTE — Plan of Care (Signed)
D: Patient presents guarded, anxious. He slept fair last night and received medication that was helpful. His appetite is good, energy normal and concentration poor. He rates his depression 4/10, feeling of hopelessness 7/10 and anxiety 8/10. He denies withdrawal symptoms or physical complaints. Patient is having passive SI occasionally. Patient denies HI/AVH.  A: Patient checked q15 min, and checks reviewed. Reviewed medication changes with patient and educated on side effects. Educated patient on importance of attending group therapy sessions and educated on several coping skills. Encouarged participation in milieu through recreation therapy and attending meals with peers. Support and encouragement provided. Fluids offered. R: Patient receptive to education on medications, and is medication compliant. Patient contracts for safety on the unit. Goals: "continuing to write my journal" and "Being able to focus better."

## 2018-04-11 NOTE — Progress Notes (Signed)
D: Patient observed up and visible on the unit this evening. As he did last night, patient approached this Probation officer stating, "I'm having those thoughts. Intrusive in nature. They cause me to want to self-harm. I know what you're going to say. That I have to take the risperdal but it doesn't work. And you won't give me the other one, the geodon because it's for emergencies and violent people. These thoughts make me want to punch myself in the head or take my finger nail and scratch lengthwise up my inner arm. Or I can do the other thing that helps, but that's inappropriate." Patient makes these statements calmly with blunted affect. No distress noted and patient appears to seek surprised response from this writer which he does not receive.    A: Offered to give patient his hs meds at 2050 however patient states, "they didn't work last night so there's no point in doing that again." Discussed with Lonell Grandchild, PA. Orders received. Medicated per orders (see eMAR). Medication education provided. Level III obs in place for safety. Emotional support offered. Patient encouraged to complete Suicide Safety Plan before discharge. Encouraged to attend and participate in unit programming.   R: Patient verbalizes understanding of POC. On reassess, patient was asleep. Patient denies HI/AVH and verbally contracts for safety. Patient remains safe on level III obs. Will continue to monitor throughout the night.

## 2018-04-11 NOTE — BHH Group Notes (Signed)
[place] LCSW Group Therapy Note  04/11/2018  10:00-11:00AM  Type of Therapy and Topic:  Group Therapy:  Adding Supports Including Being Your Own Support  Participation Level:  Active   Description of Group:  Patients in this group were introduced to the concept that additional supports including self-support are an essential part of recovery.  A song entitled "I Need Help!" was played and a group discussion was held in reaction to the idea of needing to add supports.  A song entitled "My Own Hero" was played and a group discussion ensued in which patients stated they could relate to the song and it inspired them to realize they have be willing to help themselves in order to succeed, because other people cannot achieve sobriety or stability for them.  We discussed adding a variety of healthy supports to address the various needs in their lives.  A song was played called "I Know Where I've Been" toward the end of group and used to conduct an inspirational wrap-up to group of remembering how far they have already come in their journey.  Therapeutic Goals: 1)  demonstrate the importance of being a part of one's own support system 2)  discuss reasons people in one's life may eventually be unable to be continually supportive  3)  identify the patient's current support system and   4)  elicit commitments to add healthy supports and to become more conscious of being self-supportive   Summary of Patient Progress:  The patient expressed that his mother, brother and best friend are healthy for him "to some extent" because they will talk to him on the phone and be positive and encouraging for him; however, they never call him and he would like it if they did.  He said he has no unhealthy supports.  He expressed several times that he does not want to have to ask anyone for help and wants to be well without asking for assistance.  While he was able to understand the reasoning listed that we need additional supports,  he said he was not at a point of wanting to accept that.   Therapeutic Modalities:   Motivational Interviewing Activity  Maretta Los

## 2018-04-11 NOTE — BHH Group Notes (Signed)
[place] Group Notes:  (Nursing)  Date:  04/10/18 Time: 1:15 PM Type of Therapy:  Nurse Education  Participation Level:  Active  Participation Quality:  Attentive  Affect:  Appropriate  Cognitive:  Alert  Insight:  Improving  Engagement in Group:  Engaged  Modes of Intervention:  Discussion and Education  Summary of Progress/Problems: Nurse led group: Healthy Sleep Habits  Waymond Cera 04/11/2018, 9:30 AM

## 2018-04-11 NOTE — BHH Group Notes (Signed)
Adult Psychoeducational Group Note  Date:  04/11/2018 Time:  9:15 AM  Group Topic/Focus:  Goals Group:   The focus of this group is to help patients establish daily goals to achieve during treatment and discuss how the patient can incorporate goal setting into their daily lives to aide in recovery.  Participation Level:  Active  Participation Quality:  Appropriate  Affect:  Appropriate  Cognitive:  Alert  Insight: Good  Engagement in Group:  Engaged  Modes of Intervention:  Orientation  Additional Comments:  Pt attended and participated in orientation/goals group. Pt goal for today is to continue writing positive thoughts in his journal.  Huel Cote 04/11/2018, 9:15 AM

## 2018-04-12 LAB — GLUCOSE, CAPILLARY: GLUCOSE-CAPILLARY: 87 mg/dL (ref 70–99)

## 2018-04-12 MED ORDER — QUETIAPINE FUMARATE 50 MG PO TABS
50.0000 mg | ORAL_TABLET | Freq: Every day | ORAL | Status: DC
  Administered 2018-04-12 – 2018-04-14 (×3): 50 mg via ORAL
  Filled 2018-04-12 (×6): qty 1

## 2018-04-12 MED ORDER — FLUVOXAMINE MALEATE 50 MG PO TABS
150.0000 mg | ORAL_TABLET | Freq: Every day | ORAL | Status: DC
  Administered 2018-04-12 – 2018-04-16 (×5): 150 mg via ORAL
  Filled 2018-04-12 (×8): qty 3

## 2018-04-12 NOTE — Progress Notes (Signed)
Recreation Therapy Notes  Date: 10.28.19 Time: 0930 Location: [address] Dayroom  Group Topic: Stress Management  Goal Area(s) Addresses:  Patient will verbalize importance of using healthy stress management.  Patient will identify positive emotions associated with healthy stress management.   Intervention: Stress Management  Activity :  Meditation.  LRT introduced the stress management technique of meditation.  LRT played a meditation on being resilient.  Patients were to follow along as meditation played to engage in activity.  Education:  Stress Management, Discharge Planning.   Education Outcome: Acknowledges edcuation/In group clarification offered/Needs additional education  Clinical Observations/Feedback: Pt did not attend group.    Victorino Sparrow, LRT/CTRS         Victorino Sparrow A 04/12/2018 12:03 PM

## 2018-04-12 NOTE — BHH Group Notes (Signed)
LCSW Group Therapy Note 04/12/2018 2:36 PM  Type of Therapy and Topic: Group Therapy: Overcoming Obstacles  Participation Level: Active  Description of Group:  In this group patients will be encouraged to explore what they see as obstacles to their own wellness and recovery. They will be guided to discuss their thoughts, feelings, and behaviors related to these obstacles. The group will process together ways to cope with barriers, with attention given to specific choices patients can make. Each patient will be challenged to identify changes they are motivated to make in order to overcome their obstacles. This group will be process-oriented, with patients participating in exploration of their own experiences as well as giving and receiving support and challenge from other group members.  Therapeutic Goals: 1. Patient will identify personal and current obstacles as they relate to admission. 2. Patient will identify barriers that currently interfere with their wellness or overcoming obstacles.  3. Patient will identify feelings, thought process and behaviors related to these barriers. 4. Patient will identify two changes they are willing to make to overcome these obstacles:   Summary of Patient Progress  Diesel was engaged and participated throughout the group session. Keithon states that his main obstacle is "overcoming the idea that I am special". Rastus states that he struggles with being "too hard" on himself.   Therapeutic Modalities:  Cognitive Behavioral Therapy Solution Focused Therapy Motivational Interviewing Relapse Prevention Therapy   Theresa Duty Clinical Social Worker

## 2018-04-12 NOTE — Progress Notes (Signed)
[facility] MD Progress Note  04/12/2018 3:05 PM Marc Long  MRN:  [id number] Subjective:  Patient is seen and examined. Patient is a 42 year old male with a past psychiatric history significant for major depression versus bipolar disorder; depressed. Patient presented to the [facility] emergency department voluntarily by way of his mother secondary to a reported overdose. The patient stated he had ingested 21 Vistaril, 6 Seroquel, 3 trazodone, and 1 melatonin at approximately 11 PM. Patient stated that after taking the pills he spoke to his mother, and admitted that he did it so she could bring him to the hospital. He made himself throw up prior to coming to the hospital. He stated that he was significantly depressed. He admitted that he had previously attempted to harm himself in the past. He denied any auditory, visual or tactile hallucinations. No homicidal ideation. He is followed by [facility] and has a therapist. He was last hospitalized at [facility] on 12/10/2017. He was hospitalized at that time because of suicidal ideation depression. He was transferred to the [facility] for ECT. He received 4 treatments at that time. He was then discharged for outpatient treatment. He stated that he is still currently depressed. He stated that he went to [facility] earlier this week and they increased his fluoxetine to 60 mg p.o. daily. He stated he was unable to continue outpatient ECT because of financial reasons. He stated that he continues to have intrusive thoughts which makes him want to harm himself. He also stated that these intrusive thoughts that started when he was a teenager when he felt as though he had to control thoughts and not to "rape people". His intrusive thoughts now are to injure himself. He stated that occasionally he will look for a pen or pencil to stab to his hand. He sometimes goes in the bathroom and hits his hand violently to control these urges.  He denied any other compulsive or obsessive behaviors. He was admitted to the hospital for evaluation and stabilization.  Objective: Patient is seen and examined.  Patient is a 42 year old male with a past psychiatric history significant for depression, anxiety, probable schizoaffective disorder and or potential schzoid personality disorder.  He is seen in follow-up.  He had a good day yesterday, but this morning he feels more depressed.  We discussed that.  His Seroquel had been decreased yesterday.  We had a long discussion about his vegan sense of being told how superior was through his life.  We also discussed how this might lead to his own sense of self harm as well as harm to others.  He felt like today his mood was down but not as bad as it has been.  His sleep was not as good last night, and it may have been due to the decrease in the Seroquel.  He denied any side effects to the Luvox or other medications.  Principal Problem: <principal problem not specified> Diagnosis:   Patient Active Problem List   Diagnosis Date Noted  . Schizophrenia ([place]) [F20.9] 04/06/2018  . Severe recurrent major depression without psychotic features ([place]) [F33.2] 12/16/2017  . MDD (major depressive disorder), recurrent episode, severe ([place]) [F33.2] 12/10/2017  . Obesity (BMI 30-39.9) [E66.9] 12/09/2017  . Severe bipolar I disorder, most recent episode depressed ([place]) [F31.4]   . DKA (diabetic ketoacidoses) ([place]) [E11.10] 12/05/2017  . Hypertriglyceridemia [E78.1] 02/05/2017  . DM (diabetes mellitus), type 2, uncontrolled ([place]) [E11.65] 12/12/2016  . Dehydration [E86.0] 12/12/2016  . Suicidal ideations [R45.851]  12/12/2016  . Bipolar disorder (manic depression) ([place]) [F31.9] 12/12/2016  . Hyperglycemia due to type 2 diabetes mellitus ([place]) [E11.65] 12/12/2016  . AKI (acute kidney injury) ([place]) [N17.9] 12/12/2016  . DKA, type 2, not at goal [facility]) [E11.10] 12/12/2016  . Obesity, Class III, BMI 40-49.9 (morbid  obesity) ([place]) [E66.01]   . Type 2 diabetes mellitus without complication, without long-term current use of insulin ([place]) [E11.9] 12/10/2016  . Lithium use [Z79.899] 12/10/2016  . Moderate single current episode of major depressive disorder ([place]) [F32.1] 04/15/2016  . Pain in joint, shoulder region [M25.519] 04/14/2016  . Diverticulosis of large intestine without hemorrhage [K57.30] 11/07/2015  . Calculus of gallbladder without cholecystitis [K80.20] 11/07/2015   Total Time spent with patient: 15 minutes  Past Psychiatric History: The admission H&P  Past Medical History:  Past Medical History:  Diagnosis Date  . Depression   . Diabetes mellitus without complication ([place])   . Gallstones   . Obesity, Class III, BMI 40-49.9 (morbid obesity) ([place])    History reviewed. No pertinent surgical history. Family History:  Family History  Problem Relation Age of Onset  . Hypertension Mother   . Depression Mother   . Heart attack Maternal Grandfather   . Lymphoma Maternal Grandmother   . Diabetes Maternal Aunt   . Cancer Maternal Aunt    Family Psychiatric  History: See admission H&P Social History:  Social History   Substance and Sexual Activity  Alcohol Use Yes  . Alcohol/week: 0.0 standard drinks   Comment: once every 2 weeks. Wine coolers     Social History   Substance and Sexual Activity  Drug Use No    Social History   Socioeconomic History  . Marital status: Single    Spouse name: Not on file  . Number of children: 0  . Years of education: Not on file  . Highest education level: Not on file  Occupational History  . Occupation: call center  Social Needs  . Financial resource strain: Not on file  . Food insecurity:    Worry: Not on file    Inability: Not on file  . Transportation needs:    Medical: Not on file    Non-medical: Not on file  Tobacco Use  . Smoking status: Never Smoker  . Smokeless tobacco: Never Used  Substance and Sexual Activity  . Alcohol  use: Yes    Alcohol/week: 0.0 standard drinks    Comment: once every 2 weeks. Wine coolers  . Drug use: No  . Sexual activity: Not Currently    Birth control/protection: None  Lifestyle  . Physical activity:    Days per week: Not on file    Minutes per session: Not on file  . Stress: Not on file  Relationships  . Social connections:    Talks on phone: Not on file    Gets together: Not on file    Attends religious service: Not on file    Active member of club or organization: Not on file    Attends meetings of clubs or organizations: Not on file    Relationship status: Not on file  Other Topics Concern  . Not on file  Social History Narrative  . Not on file   Additional Social History:                         Sleep: Fair  Appetite:  Good  Current Medications: Current Facility-Administered Medications  Medication Dose Route Frequency Provider  Last Rate Last Dose  . acetaminophen (TYLENOL) tablet 650 mg  650 mg Oral Q4H PRN Suella Broad, FNP      . alum & mag hydroxide-simeth (MAALOX/MYLANTA) 200-200-20 MG/5ML suspension 30 mL  30 mL Oral Q4H PRN Starkes-Perry, Gayland Curry, FNP      . busPIRone (BUSPAR) tablet 15 mg  15 mg Oral TID Sharma Covert, MD   15 mg at 04/12/18 1205  . fluvoxaMINE (LUVOX) tablet 150 mg  150 mg Oral QHS Sharma Covert, MD      . hydrOXYzine (ATARAX/VISTARIL) tablet 50 mg  50 mg Oral Q6H PRN Laverle Hobby, PA-C   50 mg at 04/11/18 2114  . lisinopril (PRINIVIL,ZESTRIL) tablet 5 mg  5 mg Oral Daily Sharma Covert, MD   5 mg at 04/12/18 0957  . magnesium hydroxide (MILK OF MAGNESIA) suspension 30 mL  30 mL Oral Daily PRN Suella Broad, FNP      . metFORMIN (GLUCOPHAGE) tablet 1,000 mg  1,000 mg Oral BID WC Suella Broad, FNP   1,000 mg at 04/12/18 0957  . multivitamin with minerals tablet 1 tablet  1 tablet Oral Daily Suella Broad, FNP   1 tablet at 04/12/18 0958  . ondansetron (ZOFRAN) tablet 4 mg   4 mg Oral Q8H PRN Suella Broad, FNP      . pantoprazole (PROTONIX) EC tablet 40 mg  40 mg Oral Daily Suella Broad, FNP   40 mg at 04/12/18 0957  . QUEtiapine (SEROQUEL) tablet 25 mg  25 mg Oral Daily Sharma Covert, MD   25 mg at 04/12/18 0958  . QUEtiapine (SEROQUEL) tablet 50 mg  50 mg Oral QHS Sharma Covert, MD      . risperiDONE (RISPERDAL M-TABS) disintegrating tablet 2 mg  2 mg Oral Q8H PRN Suella Broad, FNP   2 mg at 04/09/18 2006   And  . ziprasidone (GEODON) injection 20 mg  20 mg Intramuscular PRN Suella Broad, FNP      . traZODone (DESYREL) tablet 150 mg  150 mg Oral QHS,MR X 1 Laverle Hobby, PA-C   150 mg at 04/11/18 2252    Lab Results: No results found for this or any previous visit (from the past 48 hour(s)).  Blood Alcohol level:  Lab Results  Component Value Date   ETH <10 04/06/2018   ETH <10 62/22/9798    Metabolic Disorder Labs: Lab Results  Component Value Date   HGBA1C 5.6 04/07/2018   MPG 114.02 04/07/2018   MPG 432.59 12/09/2017   Lab Results  Component Value Date   PROLACTIN 16.1 (H) 04/07/2018   Lab Results  Component Value Date   CHOL 172 04/07/2018   TRIG 186 (H) 04/07/2018   HDL 31 (L) 04/07/2018   CHOLHDL 5.5 04/07/2018   VLDL 37 04/07/2018   LDLCALC 104 (H) 04/07/2018   LDLCALC 85 03/05/2017    Physical Findings: AIMS: Facial and Oral Movements Muscles of Facial Expression: None, normal Lips and Perioral Area: None, normal Jaw: None, normal Tongue: None, normal,Extremity Movements Upper (arms, wrists, hands, fingers): None, normal Lower (legs, knees, ankles, toes): None, normal, Trunk Movements Neck, shoulders, hips: None, normal, Overall Severity Severity of abnormal movements (highest score from questions above): None, normal Incapacitation due to abnormal movements: None, normal Patient's awareness of abnormal movements (rate only patient's report): No Awareness, Dental  Status Current problems with teeth and/or dentures?: No Does patient usually wear dentures?: No  CIWA:  CIWA-Ar Total: 2 COWS:  COWS Total Score: 0  Musculoskeletal: Strength & Muscle Tone: within normal limits Gait & Station: normal Patient leans: N/A  Psychiatric Specialty Exam: Physical Exam  Nursing note and vitals reviewed. Constitutional: He is oriented to person, place, and time. He appears well-developed and well-nourished.  HENT:  Head: Normocephalic and atraumatic.  Respiratory: Effort normal.  Neurological: He is alert and oriented to person, place, and time.    ROS  Blood pressure 112/86, pulse (!) 102, temperature 97.7 F (36.5 C), temperature source Oral, resp. rate 20, height 6' (1.829 m), weight 123.4 kg, SpO2 99 %.Body mass index is 36.89 kg/m.  General Appearance: Casual  Eye Contact:  Fair  Speech:  Normal Rate  Volume:  Normal  Mood:  Anxious and Depressed  Affect:  Congruent  Thought Process:  Coherent and Descriptions of Associations: Intact  Orientation:  Full (Time, Place, and Person)  Thought Content:  Logical  Suicidal Thoughts:  Yes.  without intent/plan  Homicidal Thoughts:  No  Memory:  Immediate;   Fair Recent;   Fair Remote;   Fair  Judgement:  Fair  Insight:  Fair  Psychomotor Activity:  Increased  Concentration:  Concentration: Fair and Attention Span: Fair  Recall:  [employer] of Knowledge:  Fair  Language:  Good  Akathisia:  Negative  Handed:  Right  AIMS (if indicated):     Assets:  Communication Skills Desire for Improvement Financial Resources/Insurance Housing Leisure Time Physical Health Resilience Social Support  ADL's:  Intact  Cognition:  WNL  Sleep:  Number of Hours: 6.75     Treatment Plan Summary: Daily contact with patient to assess and evaluate symptoms and progress in treatment, Medication management and Plan : Patient is seen and examined.  Patient is a 42 year old male with the above-stated past  psychiatric history who is seen in follow-up.  #1 schizoaffective disorder; bipolar type/schzoid personality disorder/OCD-I will increase his Luvox 250 mg p.o. nightly.  I will put his Seroquel back to 50 mg p.o. nightly and 25 mg p.o. daily.  I will continue his BuSpar at 15 mg p.o. 3 times daily.  #2 hypertension-currently well controlled on lisinopril 5 mg p.o. daily.  #3 diabetes mellitus type 2-currently well controlled with metformin at thousand milligrams p.o. twice daily.  #5-disposition planning-in progress.  Sharma Covert, MD 04/12/2018, 3:05 PM

## 2018-04-12 NOTE — Tx Team (Signed)
Interdisciplinary Treatment and Diagnostic Plan Update  04/12/2018 Time of Session: 9:55am Marc Long MRN: [id number]  Principal Diagnosis: <principal problem not specified>  Secondary Diagnoses: Active Problems:   Severe bipolar I disorder, most recent episode depressed (HCC)   Schizophrenia (HCC)   Current Medications:  Current Facility-Administered Medications  Medication Dose Route Frequency Provider Last Rate Last Dose  . acetaminophen (TYLENOL) tablet 650 mg  650 mg Oral Q4H PRN Suella Broad, FNP      . alum & mag hydroxide-simeth (MAALOX/MYLANTA) 200-200-20 MG/5ML suspension 30 mL  30 mL Oral Q4H PRN Starkes-Perry, Gayland Curry, FNP      . busPIRone (BUSPAR) tablet 15 mg  15 mg Oral TID Sharma Covert, MD   15 mg at 04/11/18 1703  . fluvoxaMINE (LUVOX) tablet 100 mg  100 mg Oral QHS Sharma Covert, MD   100 mg at 04/11/18 2114  . hydrOXYzine (ATARAX/VISTARIL) tablet 50 mg  50 mg Oral Q6H PRN Laverle Hobby, PA-C   50 mg at 04/11/18 2114  . lisinopril (PRINIVIL,ZESTRIL) tablet 5 mg  5 mg Oral Daily Sharma Covert, MD   5 mg at 04/11/18 1140  . magnesium hydroxide (MILK OF MAGNESIA) suspension 30 mL  30 mL Oral Daily PRN Suella Broad, FNP      . metFORMIN (GLUCOPHAGE) tablet 1,000 mg  1,000 mg Oral BID WC Suella Broad, FNP   1,000 mg at 04/11/18 1703  . multivitamin with minerals tablet 1 tablet  1 tablet Oral Daily Suella Broad, FNP   1 tablet at 04/11/18 0852  . ondansetron (ZOFRAN) tablet 4 mg  4 mg Oral Q8H PRN Suella Broad, FNP      . pantoprazole (PROTONIX) EC tablet 40 mg  40 mg Oral Daily Suella Broad, FNP   40 mg at 04/11/18 0852  . QUEtiapine (SEROQUEL) tablet 25 mg  25 mg Oral Daily Sharma Covert, MD   25 mg at 04/11/18 0174  . risperiDONE (RISPERDAL M-TABS) disintegrating tablet 2 mg  2 mg Oral Q8H PRN Suella Broad, FNP   2 mg at 04/09/18 2006   And  . ziprasidone (GEODON)  injection 20 mg  20 mg Intramuscular PRN Suella Broad, FNP      . traZODone (DESYREL) tablet 150 mg  150 mg Oral QHS,MR X 1 Laverle Hobby, PA-C   150 mg at 04/11/18 2252   PTA Medications: Medications Prior to Admission  Medication Sig Dispense Refill Last Dose  . busPIRone (BUSPAR) 10 MG tablet Take 1 tablet (10 mg total) by mouth 3 (three) times daily. 90 tablet 1 04/04/2018  . FLUoxetine (PROZAC) 40 MG capsule Take 1 capsule (40 mg total) by mouth daily. 30 capsule 1 04/04/2018  . hydrOXYzine (ATARAX/VISTARIL) 50 MG tablet Take 1 tablet (50 mg total) by mouth 3 (three) times daily as needed for anxiety. (Patient taking differently: Take 50 mg by mouth once. ) 30 tablet 1 04/05/2018 at Unknown time  . metFORMIN (GLUCOPHAGE) 1000 MG tablet Take 1 tablet (1,000 mg total) by mouth 2 (two) times daily with a meal. 60 tablet 1 04/04/2018  . Multiple Vitamin (MULTIVITAMIN WITH MINERALS) TABS tablet Take 1 tablet by mouth daily. 30 tablet 0 04/04/2018 at Unknown time  . pantoprazole (PROTONIX) 40 MG tablet Take 1 tablet (40 mg total) by mouth daily. 30 tablet 1 04/04/2018 at Unknown time  . polyethylene glycol (MIRALAX / GLYCOLAX) packet Take 17 g by mouth daily. (  Patient not taking: Reported on 04/06/2018) 30 each 1 Not Taking at Unknown time  . QUEtiapine (SEROQUEL) 100 MG tablet Take 1 tablet (100 mg total) by mouth at bedtime. 30 tablet 1 04/05/2018 at Unknown time  . traZODone (DESYREL) 100 MG tablet Take 1 tablet (100 mg total) by mouth at bedtime as needed for sleep. 30 tablet 1 04/05/2018 at Unknown time    Patient Stressors: Medication change or noncompliance  Patient Strengths: Ability for insight Communication skills Supportive family/friends  Treatment Modalities: Medication Management, Group therapy, Case management,  1 to 1 session with clinician, Psychoeducation, Recreational therapy.   Physician Treatment Plan for Primary Diagnosis: <principal problem not  specified> Long Term Goal(s): Improvement in symptoms so as ready for discharge Improvement in symptoms so as ready for discharge   Short Term Goals: Ability to identify changes in lifestyle to reduce recurrence of condition will improve Ability to verbalize feelings will improve Ability to disclose and discuss suicidal ideas Ability to demonstrate self-control will improve Ability to identify and develop effective coping behaviors will improve Ability to maintain clinical measurements within normal limits will improve Ability to identify changes in lifestyle to reduce recurrence of condition will improve Ability to verbalize feelings will improve Ability to disclose and discuss suicidal ideas Ability to demonstrate self-control will improve Ability to identify and develop effective coping behaviors will improve Ability to maintain clinical measurements within normal limits will improve  Medication Management: Evaluate patient's response, side effects, and tolerance of medication regimen.  Therapeutic Interventions: 1 to 1 sessions, Unit Group sessions and Medication administration.  Evaluation of Outcomes: Progressing  Physician Treatment Plan for Secondary Diagnosis: Active Problems:   Severe bipolar I disorder, most recent episode depressed ([place])   Schizophrenia ([place])  Long Term Goal(s): Improvement in symptoms so as ready for discharge Improvement in symptoms so as ready for discharge   Short Term Goals: Ability to identify changes in lifestyle to reduce recurrence of condition will improve Ability to verbalize feelings will improve Ability to disclose and discuss suicidal ideas Ability to demonstrate self-control will improve Ability to identify and develop effective coping behaviors will improve Ability to maintain clinical measurements within normal limits will improve Ability to identify changes in lifestyle to reduce recurrence of condition will improve Ability to  verbalize feelings will improve Ability to disclose and discuss suicidal ideas Ability to demonstrate self-control will improve Ability to identify and develop effective coping behaviors will improve Ability to maintain clinical measurements within normal limits will improve     Medication Management: Evaluate patient's response, side effects, and tolerance of medication regimen.  Therapeutic Interventions: 1 to 1 sessions, Unit Group sessions and Medication administration.  Evaluation of Outcomes: Progressing   RN Treatment Plan for Primary Diagnosis: <principal problem not specified> Long Term Goal(s): Knowledge of disease and therapeutic regimen to maintain health will improve  Short Term Goals: Ability to participate in decision making will improve, Ability to verbalize feelings will improve, Ability to disclose and discuss suicidal ideas, Ability to identify and develop effective coping behaviors will improve and Compliance with prescribed medications will improve  Medication Management: RN will administer medications as ordered by provider, will assess and evaluate patient's response and provide education to patient for prescribed medication. RN will report any adverse and/or side effects to prescribing provider.  Therapeutic Interventions: 1 on 1 counseling sessions, Psychoeducation, Medication administration, Evaluate responses to treatment, Monitor vital signs and CBGs as ordered, Perform/monitor CIWA, COWS, AIMS and Fall Risk screenings as ordered,  Perform wound care treatments as ordered.  Evaluation of Outcomes: Progressing   LCSW Treatment Plan for Primary Diagnosis: <principal problem not specified> Long Term Goal(s): Safe transition to appropriate next level of care at discharge, Engage patient in therapeutic group addressing interpersonal concerns.  Short Term Goals: Engage patient in aftercare planning with referrals and resources  Therapeutic Interventions: Assess for  all discharge needs, 1 to 1 time with Social worker, Explore available resources and support systems, Assess for adequacy in community support network, Educate family and significant other(s) on suicide prevention, Complete Psychosocial Assessment, Interpersonal group therapy.  Evaluation of Outcomes: Progressing   Progress in Treatment: Attending groups: Yes. Participating in groups: Yes. Taking medication as prescribed: Yes. Toleration medication: Yes. Family/Significant other contact made: No, will contact:  the patient's mother Patient understands diagnosis: Yes. Discussing patient identified problems/goals with staff: Yes. Medical problems stabilized or resolved: Yes. Denies suicidal/homicidal ideation: No. Issues/concerns per patient self-inventory: No. Other:   New problem(s) identified: None   New Short Term/Long Term Goal(s): medication stabilization, elimination of SI thoughts, development of comprehensive mental wellness plan.   Patient Goals:  "I really want to get back into ECT"  Discharge Plan or Barriers: Patient plans to discharge home with his family and continue to follow up with [facility] for outpatient medication management and therapy services.   Reason for Continuation of Hospitalization: Depression Medication stabilization Suicidal ideation  Estimated Length of Stay: 3-5 days   Attendees: Patient: Marc Long  04/12/2018 9:02 AM  Physician: Dr. Myles Lipps, MD 04/12/2018 9:02 AM  Nursing: Danae Chen.C, RN; Chrys Racer.Jacinto Reap, RN 04/12/2018 9:02 AM  RN Care Manager:X 04/12/2018 9:02 AM  Social Worker: Radonna Ricker, [place] 04/12/2018 9:02 AM  Recreational Therapist: Rhunette Croft 04/12/2018 9:02 AM  Other: Marvia Pickles, NP 04/12/2018 9:02 AM  Other: Ricky Ala, NP 04/12/2018 9:02 AM  Other:Aggie Pat Patrick, NP 04/12/2018 9:02 AM    Scribe for Treatment Team: Marylee Floras, [place] 04/12/2018 9:02 AM

## 2018-04-12 NOTE — Progress Notes (Signed)
D: Patient states he did not have a good weekend.  He states that he "had moments where I felt like hurting myself."  He states that he feels better today.  He rates his depression as a 5; hopelessness as a 3; anxiety as a 4.  His goal today is to "speak with doctor."  Patient continues to have some passive SI.  He is able to contract for safety on the unit.  His affect is flat; his mood is depressed.  A: Continue to monitor medication management and MD orders.  Safety checks completed every 15 minutes per protocol.  Offer support and encouragement as needed.  R: Patient is receptive to staff; his behavior is appropriate.

## 2018-04-13 DIAGNOSIS — F39 Unspecified mood [affective] disorder: Secondary | ICD-10-CM

## 2018-04-13 DIAGNOSIS — G4700 Insomnia, unspecified: Secondary | ICD-10-CM

## 2018-04-13 DIAGNOSIS — F419 Anxiety disorder, unspecified: Secondary | ICD-10-CM

## 2018-04-13 MED ORDER — TRAZODONE HCL 100 MG PO TABS
100.0000 mg | ORAL_TABLET | Freq: Every evening | ORAL | Status: DC | PRN
  Administered 2018-04-13 – 2018-04-14 (×2): 100 mg via ORAL
  Filled 2018-04-13 (×2): qty 1

## 2018-04-13 NOTE — Progress Notes (Signed)
[facility] MD Progress Note  04/13/2018 12:21 PM Gussie Towson  MRN:  [id number] Subjective:  Patient reports partially improved mood although describes persistent depression and intermittent suicidal ideations which she describes as chronic and passive.  Denies medication side effects.  States he had a good visit from his mother yesterday evening.    Objective: I have discussed case with treatment team and have met with patient. 42 year old male with a past psychiatric history significant for major depression versus bipolar disorder; depressed. Presented for depression and following overdose onVistaril, Seroquel, Trazodone, Melatonin.  Of note, he has a history of prior inpatient ECT treatment.  States that this treatment was effective and well-tolerated but he was unable to continue it due to financial difficulties. At present describes partial improvement compared to how he felt prior to admission.  Describes lingering depression and chronic/ intermittent suicidal ideations, although without plan or intention.  Contracts for safety on unit at this time. Denies medication side effects.  He has been visible on unit, affect tends to improve during interactions.  History of diabetes mellitus-currently on Glucophage-CBG today is 79.     Principal Problem: Bipolar Disorder by history  Diagnosis:   Patient Active Problem List   Diagnosis Date Noted  . Schizophrenia ([place]) [F20.9] 04/06/2018  . Severe recurrent major depression without psychotic features ([place]) [F33.2] 12/16/2017  . MDD (major depressive disorder), recurrent episode, severe ([place]) [F33.2] 12/10/2017  . Obesity (BMI 30-39.9) [E66.9] 12/09/2017  . Severe bipolar I disorder, most recent episode depressed ([place]) [F31.4]   . DKA (diabetic ketoacidoses) ([place]) [E11.10] 12/05/2017  . Hypertriglyceridemia [E78.1] 02/05/2017  . DM (diabetes mellitus), type 2, uncontrolled ([place]) [E11.65] 12/12/2016  . Dehydration [E86.0] 12/12/2016  .  Suicidal ideations [R45.851] 12/12/2016  . Bipolar disorder (manic depression) ([place]) [F31.9] 12/12/2016  . Hyperglycemia due to type 2 diabetes mellitus ([place]) [E11.65] 12/12/2016  . AKI (acute kidney injury) ([place]) [N17.9] 12/12/2016  . DKA, type 2, not at goal [facility]) [E11.10] 12/12/2016  . Obesity, Class III, BMI 40-49.9 (morbid obesity) ([place]) [E66.01]   . Type 2 diabetes mellitus without complication, without long-term current use of insulin ([place]) [E11.9] 12/10/2016  . Lithium use [Z79.899] 12/10/2016  . Moderate single current episode of major depressive disorder ([place]) [F32.1] 04/15/2016  . Pain in joint, shoulder region [M25.519] 04/14/2016  . Diverticulosis of large intestine without hemorrhage [K57.30] 11/07/2015  . Calculus of gallbladder without cholecystitis [K80.20] 11/07/2015   Total Time spent with patient: 20 minutes  Past Psychiatric History: The admission H&P  Past Medical History:  Past Medical History:  Diagnosis Date  . Depression   . Diabetes mellitus without complication ([place])   . Gallstones   . Obesity, Class III, BMI 40-49.9 (morbid obesity) ([place])    History reviewed. No pertinent surgical history. Family History:  Family History  Problem Relation Age of Onset  . Hypertension Mother   . Depression Mother   . Heart attack Maternal Grandfather   . Lymphoma Maternal Grandmother   . Diabetes Maternal Aunt   . Cancer Maternal Aunt    Family Psychiatric  History: See admission H&P Social History:  Social History   Substance and Sexual Activity  Alcohol Use Yes  . Alcohol/week: 0.0 standard drinks   Comment: once every 2 weeks. Wine coolers     Social History   Substance and Sexual Activity  Drug Use No    Social History   Socioeconomic History  . Marital status: Single    Spouse name: Not on  file  . Number of children: 0  . Years of education: Not on file  . Highest education level: Not on file  Occupational History  . Occupation: call center   Social Needs  . Financial resource strain: Not on file  . Food insecurity:    Worry: Not on file    Inability: Not on file  . Transportation needs:    Medical: Not on file    Non-medical: Not on file  Tobacco Use  . Smoking status: Never Smoker  . Smokeless tobacco: Never Used  Substance and Sexual Activity  . Alcohol use: Yes    Alcohol/week: 0.0 standard drinks    Comment: once every 2 weeks. Wine coolers  . Drug use: No  . Sexual activity: Not Currently    Birth control/protection: None  Lifestyle  . Physical activity:    Days per week: Not on file    Minutes per session: Not on file  . Stress: Not on file  Relationships  . Social connections:    Talks on phone: Not on file    Gets together: Not on file    Attends religious service: Not on file    Active member of club or organization: Not on file    Attends meetings of clubs or organizations: Not on file    Relationship status: Not on file  Other Topics Concern  . Not on file  Social History Narrative  . Not on file   Additional Social History:    Sleep: Improving  Appetite:  Good  Current Medications: Current Facility-Administered Medications  Medication Dose Route Frequency Provider Last Rate Last Dose  . acetaminophen (TYLENOL) tablet 650 mg  650 mg Oral Q4H PRN Suella Broad, FNP      . alum & mag hydroxide-simeth (MAALOX/MYLANTA) 200-200-20 MG/5ML suspension 30 mL  30 mL Oral Q4H PRN Starkes-Perry, Gayland Curry, FNP      . busPIRone (BUSPAR) tablet 15 mg  15 mg Oral TID Sharma Covert, MD   15 mg at 04/13/18 0818  . fluvoxaMINE (LUVOX) tablet 150 mg  150 mg Oral QHS Sharma Covert, MD   150 mg at 04/12/18 2109  . hydrOXYzine (ATARAX/VISTARIL) tablet 50 mg  50 mg Oral Q6H PRN Laverle Hobby, PA-C   50 mg at 04/12/18 2109  . lisinopril (PRINIVIL,ZESTRIL) tablet 5 mg  5 mg Oral Daily Sharma Covert, MD   5 mg at 04/13/18 0820  . magnesium hydroxide (MILK OF MAGNESIA) suspension 30 mL  30  mL Oral Daily PRN Suella Broad, FNP      . metFORMIN (GLUCOPHAGE) tablet 1,000 mg  1,000 mg Oral BID WC Suella Broad, FNP   1,000 mg at 04/13/18 0818  . multivitamin with minerals tablet 1 tablet  1 tablet Oral Daily Suella Broad, FNP   1 tablet at 04/13/18 0819  . ondansetron (ZOFRAN) tablet 4 mg  4 mg Oral Q8H PRN Suella Broad, FNP      . pantoprazole (PROTONIX) EC tablet 40 mg  40 mg Oral Daily Suella Broad, FNP   40 mg at 04/13/18 0819  . QUEtiapine (SEROQUEL) tablet 25 mg  25 mg Oral Daily Sharma Covert, MD   25 mg at 04/13/18 0819  . QUEtiapine (SEROQUEL) tablet 50 mg  50 mg Oral QHS Sharma Covert, MD   50 mg at 04/12/18 2109  . risperiDONE (RISPERDAL M-TABS) disintegrating tablet 2 mg  2 mg Oral Q8H PRN Starkes-Perry,  Gayland Curry, FNP   2 mg at 04/09/18 2006   And  . ziprasidone (GEODON) injection 20 mg  20 mg Intramuscular PRN Suella Broad, FNP      . traZODone (DESYREL) tablet 150 mg  150 mg Oral QHS,MR X 1 Laverle Hobby, PA-C   Stopped at 04/12/18 2238    Lab Results:  Results for orders placed or performed during the hospital encounter of 04/06/18 (from the past 48 hour(s))  Glucose, capillary     Status: None   Collection Time: 04/12/18  8:37 PM  Result Value Ref Range   Glucose-Capillary 87 70 - 99 mg/dL    Blood Alcohol level:  Lab Results  Component Value Date   ETH <10 04/06/2018   ETH <10 42/87/6811    Metabolic Disorder Labs: Lab Results  Component Value Date   HGBA1C 5.6 04/07/2018   MPG 114.02 04/07/2018   MPG 432.59 12/09/2017   Lab Results  Component Value Date   PROLACTIN 16.1 (H) 04/07/2018   Lab Results  Component Value Date   CHOL 172 04/07/2018   TRIG 186 (H) 04/07/2018   HDL 31 (L) 04/07/2018   CHOLHDL 5.5 04/07/2018   VLDL 37 04/07/2018   LDLCALC 104 (H) 04/07/2018   LDLCALC 85 03/05/2017    Physical Findings: AIMS: Facial and Oral Movements Muscles of Facial  Expression: None, normal Lips and Perioral Area: None, normal Jaw: None, normal Tongue: None, normal,Extremity Movements Upper (arms, wrists, hands, fingers): None, normal Lower (legs, knees, ankles, toes): None, normal, Trunk Movements Neck, shoulders, hips: None, normal, Overall Severity Severity of abnormal movements (highest score from questions above): None, normal Incapacitation due to abnormal movements: None, normal Patient's awareness of abnormal movements (rate only patient's report): No Awareness, Dental Status Current problems with teeth and/or dentures?: No Does patient usually wear dentures?: No  CIWA:  CIWA-Ar Total: 2 COWS:  COWS Total Score: 0  Musculoskeletal: Strength & Muscle Tone: within normal limits Gait & Station: normal Patient leans: N/A  Psychiatric Specialty Exam: Physical Exam  Nursing note and vitals reviewed. Constitutional: He is oriented to person, place, and time. He appears well-developed and well-nourished.  HENT:  Head: Normocephalic and atraumatic.  Respiratory: Effort normal.  Neurological: He is alert and oriented to person, place, and time.    ROS no chest pain, no shortness of breath, no vomiting  Blood pressure 119/78, pulse 81, temperature 98 F (36.7 C), temperature source Oral, resp. rate 18, height 6' (1.829 m), weight 123.4 kg, SpO2 99 %.Body mass index is 36.89 kg/m.  General Appearance: Fairly Groomed  Eye Contact:  Good  Speech:  Normal Rate  Volume:  Normal  Mood:  Reports partially improved mood but overall remains depressed  Affect:  Constricted, does smile briefly at times  Thought Process:  Linear and Descriptions of Associations: Intact  Orientation:  Other:  Fully alert and attentive  Thought Content:  No hallucinations, no delusions  Suicidal Thoughts:  Yes.  without intent/plan-reports chronic/intermittent suicidal ideations, described as passive, denies plan or intention of hurting himself or current suicidal plan  or intent, contracts for safety on unit  Homicidal Thoughts:  No  Memory:  Recent and remote grossly intact  Judgement:  Fair/improving  Insight:  Fair/improving  Psychomotor Activity:  Normal  Concentration:  Concentration: Good and Attention Span: Good  Recall:  Good  Fund of Knowledge:  Good  Language:  Good  Akathisia:  Negative  Handed:  Right  AIMS (if indicated):  Assets:  Communication Skills Desire for Improvement Financial Resources/Insurance Housing Leisure Time Physical Health Resilience Social Support  ADL's:  Intact  Cognition:  WNL  Sleep:  Number of Hours: 6.75    Assessment -  42 year old male with a past psychiatric history significant for major depression versus bipolar disorder; depressed. Presented for depression and following overdose onVistaril, Seroquel, Trazodone, Melatonin.  Patient reports partially improved mood, although describes lingering depression and intermittent passive suicidal ideations which she describes as chronic/persistent.  Currently on BuSpar, Seroquel, Luvox which she is tolerating well thus far.  History of good response to ECT in the past but states she was unable to continue outpatient ECT due to financial constraints.   Treatment Plan Summary: Treatment plan reviewed as below today 10/29 Encourage group and milieu participation to work on coping skills and symptom reduction Continue BuSpar 15 mg 3 times daily for anxiety Continue Luvox 150 mg daily for depression Continue Seroquel 25 mg every morning and 50 mg nightly for mood disorder Continue Trazodone at 100 mgrs QHS PRN for insomnia as needed  Continue Lisinopril for hypertension and Glucophage for diabetes  Jenne Campus, MD 04/13/2018, 12:21 PM   Patient ID: [name], male   DOB: [birth year], 42 y.o.   MRN: [id number]

## 2018-04-13 NOTE — Progress Notes (Signed)
Nursing note 7p-7a  Pt observed interacting with peers on unit this shift. Displayed a flat affect and pleasant mood upon interaction with this Probation officer. Pt denies pain ,denies SI/HI, and also denies any audio or visual hallucinations at this time. Pt stated that he was worried abt his BG levels that he checks 1x per week at home. BG 87. Pt eating snack with no signs or symptoms of hypoglycemia noted. Pt is able to verbally contract for safety with this RN. Goal:" to work on my depression and homicidal thoughts"  Pt educated on falls and encouraged to wear nonskid socks when ambulating in the halls. Pt also encouraged to call for help if feeling weak or dizzy. Pt verbalized understanding of all education provided. Pt is now resting in bed with eyes closed, with no signs or symptoms of pain or distress noted. Pt continues to remain safe on the unit and is observed by rounding every 15 min. RN will continue to monitor.

## 2018-04-13 NOTE — Progress Notes (Signed)
D: Patient is observed in the day room interacting with his peers.  He requested his CBG be taken this morning and it was 87.  Patient tends to attention seek from staff.  He is bright and talkative with his peers, and then seems to decompensate when he interacts with staff.  He denies SI, however, he wrote on his self inventory that he still has thoughts of hurting himself. His goal today is to "work on my journal." he rates his depression as a 3; anxiety and hopelessness as a 5. Patient has blunted, flat affect when speaking with staff.  A: Continue to monitor medication management and MD orders.  Safety checks completed every 15 minutes per protocol.  Offer support and encouragement as needed.  R: Patient is receptive to staff; his behavior is appropriate.

## 2018-04-13 NOTE — Progress Notes (Signed)
Nursing Progress Note: 7p-7a D: Pt currently presents with a anxious/depressed affect and behavior. Pt states "I got anxious in visitation and scratched myself." Interacting invasively with the milieu. Pt reports poor sleep during the previous night with current medication regimen. Pt did attend wrap-up group.  A: Writer inspected site where pt scratched self, and there were no signs of apparent injury. Pt provided with medications per providers orders. Pt's labs and vitals were monitored throughout the night. Pt supported emotionally and encouraged to express concerns and questions. Pt educated on medications.  R: Pt's safety ensured with 15 minute and environmental checks. Pt currently denies HI and AVH and endorses passive SI. Pt verbally contracts to seek staff if SI,HI, or AVH occurs and to consult with staff before acting on any harmful thoughts. Will continue to monitor.

## 2018-04-14 NOTE — Progress Notes (Signed)
Adult Psychoeducational Group Note  Date:  04/14/2018 Time:  10:46 PM  Group Topic/Focus:  Wrap-Up Group:   The focus of this group is to help patients review their daily goal of treatment and discuss progress on daily workbooks.  Participation Level:  Active  Participation Quality:  Appropriate  Affect:  Appropriate  Cognitive:  Appropriate  Insight: Appropriate  Engagement in Group:  Engaged  Modes of Intervention:  Discussion  Additional Comments:  Pt's goal was to work more on journal writing.  Pt stated his goal was not met.  Pt stated he had an off day.  Pt rated the day at a 4/10.  Chris Cripps 04/14/2018, 10:46 PM

## 2018-04-14 NOTE — Progress Notes (Signed)
Recreation Therapy Notes  Date: 10.30.19 Time: 0930 Location: 300 Hall Dayroom  Group Topic: Stress Management  Goal Area(s) Addresses:  Patient will verbalize importance of using healthy stress management.  Patient will identify positive emotions associated with healthy stress management.   Intervention: Stress Management  Activity :  Guided Imagery.  LRT introduced the stress management technique of guided imagery.  LRT read a script that allowed patients to envision seeing the starry sky at night.  Patients were to follow along as the guided imagery was read to fully participate in the meditation.  Education:  Stress Management, Discharge Planning.   Education Outcome: Acknowledges edcuation/In group clarification offered/Needs additional education  Clinical Observations/Feedback: Pt did not attend group.     Victorino Sparrow, LRT/CTRS         Ria Comment, Takera Rayl A 04/14/2018 11:13 AM

## 2018-04-14 NOTE — Therapy (Signed)
Occupational Therapy Group Note  Date:  04/14/2018 Time:  5:28 PM  Group Topic/Focus:  Self Care  Participation Level:  Active  Participation Quality:  Appropriate  Affect:  Flat  Cognitive:  Appropriate  Insight: Improving  Engagement in Group:  Engaged  Modes of Intervention:  Activity, Discussion, Education and Socialization  Additional Comments:    S: "I have a lot of things to work on"  O: Education given on self care and its importance in supporting mental health. Self care assessment given as a self rating scale for pt to identify areas of strength and weakness in physical, psychological, social, spiritual, and professional self care. Discussion given in how to improve current practices.  ?  A: Pt presents to group with flat affect, engaged and participatory throughout. Pt identifies physical areas to improve self care and social self care. He also mentions he is working on healthy ways to express his feelings in regard to emotional self care. ?  P: Education given on self care, handouts given to facilitate carryover into community.   Zenovia Jarred, MSOT, OTR/L Behavioral Health OT/ Acute Relief OT PHP Office: [phone] [facility] Office: [phone]  Zenovia Jarred 04/14/2018, 5:28 PM

## 2018-04-14 NOTE — Plan of Care (Signed)
  Problem: Activity: Goal: Interest or engagement in activities will improve Outcome: Progressing   Problem: Coping: Goal: Ability to verbalize frustrations and anger appropriately will improve Outcome: Progressing   D: Pt alert and oriented on the unit. Pt engaging with RN staff and other pts. Pt denies A/VH, and HI, but passively endorses SI. Pt's affect was flat and mood depressed. Pt participated during unit groups and activities. Pt is pleasant and cooperative. A: Education, support, reassurance, and encouragement provided, q15 minute safety checks remain in effect. Medications administered per MD orders. R: No reactions/side effects to medicine noted. Pt denies any concerns at this time, and verbally contracts for safety. Pt ambulating on the unit with no issues. Pt remains safe on and off the unit.

## 2018-04-14 NOTE — Progress Notes (Addendum)
[facility] MD Progress Note  04/14/2018 2:42 PM Marc Long  MRN:  [id number]   Subjective: Patient reports today that he is doing good and he is hoping to go to ECT.  But he also realizes that he may only get 3-4 treatments on an inpatient unit and then he would be discharged home.  He also reports that he understands that he does not have any insurance or financial funds to afford outpatient ECT treatments.  He states that he is waiting on a [place] financial advisor to come and see him to assist with hospital financial aid.  He reports that he was told that they would be here on Thursday to talk to him about it.  He also reports that he feels that he should be good to go after his appointment with the financial advisor or potentially on Friday. Patient patient reports that last night he was up and down, but for no apparent reason.  He reports that he has normally been sleeping well.  Objective: Patient's chart and findings reviewed and discussed with treatment team.  Patient presents in his room lying in his bed, but is awake.  He is pleasant, calm, and cooperative.  Patient has been seen interacting with peers and staff on the unit.  Patient has been seen attending groups and participating.  Principal Problem: Severe bipolar I disorder, most recent episode depressed ([place]) Diagnosis:   Patient Active Problem List   Diagnosis Date Noted  . Schizophrenia ([place]) [F20.9] 04/06/2018  . Severe recurrent major depression without psychotic features ([place]) [F33.2] 12/16/2017  . MDD (major depressive disorder), recurrent episode, severe ([place]) [F33.2] 12/10/2017  . Obesity (BMI 30-39.9) [E66.9] 12/09/2017  . Severe bipolar I disorder, most recent episode depressed ([place]) [F31.4]   . DKA (diabetic ketoacidoses) ([place]) [E11.10] 12/05/2017  . Hypertriglyceridemia [E78.1] 02/05/2017  . DM (diabetes mellitus), type 2, uncontrolled ([place]) [E11.65] 12/12/2016  . Dehydration [E86.0] 12/12/2016  . Suicidal  ideations [R45.851] 12/12/2016  . Bipolar disorder (manic depression) ([place]) [F31.9] 12/12/2016  . Hyperglycemia due to type 2 diabetes mellitus ([place]) [E11.65] 12/12/2016  . AKI (acute kidney injury) ([place]) [N17.9] 12/12/2016  . DKA, type 2, not at goal [facility]) [E11.10] 12/12/2016  . Obesity, Class III, BMI 40-49.9 (morbid obesity) ([place]) [E66.01]   . Type 2 diabetes mellitus without complication, without long-term current use of insulin ([place]) [E11.9] 12/10/2016  . Lithium use [Z79.899] 12/10/2016  . Moderate single current episode of major depressive disorder ([place]) [F32.1] 04/15/2016  . Pain in joint, shoulder region [M25.519] 04/14/2016  . Diverticulosis of large intestine without hemorrhage [K57.30] 11/07/2015  . Calculus of gallbladder without cholecystitis [K80.20] 11/07/2015   Total Time spent with patient: 20 minutes  Past Psychiatric History: See H&P  Past Medical History:  Past Medical History:  Diagnosis Date  . Depression   . Diabetes mellitus without complication ([place])   . Gallstones   . Obesity, Class III, BMI 40-49.9 (morbid obesity) ([place])    History reviewed. No pertinent surgical history. Family History:  Family History  Problem Relation Age of Onset  . Hypertension Mother   . Depression Mother   . Heart attack Maternal Grandfather   . Lymphoma Maternal Grandmother   . Diabetes Maternal Aunt   . Cancer Maternal Aunt    Family Psychiatric  History: See H&P Social History:  Social History   Substance and Sexual Activity  Alcohol Use Yes  . Alcohol/week: 0.0 standard drinks   Comment: once every 2 weeks. Wine coolers  Social History   Substance and Sexual Activity  Drug Use No    Social History   Socioeconomic History  . Marital status: Single    Spouse name: Not on file  . Number of children: 0  . Years of education: Not on file  . Highest education level: Not on file  Occupational History  . Occupation: call center  Social Needs  . Financial  resource strain: Not on file  . Food insecurity:    Worry: Not on file    Inability: Not on file  . Transportation needs:    Medical: Not on file    Non-medical: Not on file  Tobacco Use  . Smoking status: Never Smoker  . Smokeless tobacco: Never Used  Substance and Sexual Activity  . Alcohol use: Yes    Alcohol/week: 0.0 standard drinks    Comment: once every 2 weeks. Wine coolers  . Drug use: No  . Sexual activity: Not Currently    Birth control/protection: None  Lifestyle  . Physical activity:    Days per week: Not on file    Minutes per session: Not on file  . Stress: Not on file  Relationships  . Social connections:    Talks on phone: Not on file    Gets together: Not on file    Attends religious service: Not on file    Active member of club or organization: Not on file    Attends meetings of clubs or organizations: Not on file    Relationship status: Not on file  Other Topics Concern  . Not on file  Social History Narrative  . Not on file   Additional Social History:                         Sleep: Fair  Appetite:  Good  Current Medications: Current Facility-Administered Medications  Medication Dose Route Frequency Provider Last Rate Last Dose  . acetaminophen (TYLENOL) tablet 650 mg  650 mg Oral Q4H PRN Suella Broad, FNP      . alum & mag hydroxide-simeth (MAALOX/MYLANTA) 200-200-20 MG/5ML suspension 30 mL  30 mL Oral Q4H PRN Starkes-Perry, Gayland Curry, FNP      . busPIRone (BUSPAR) tablet 15 mg  15 mg Oral TID Sharma Covert, MD   15 mg at 04/14/18 1208  . fluvoxaMINE (LUVOX) tablet 150 mg  150 mg Oral QHS Sharma Covert, MD   150 mg at 04/13/18 2115  . hydrOXYzine (ATARAX/VISTARIL) tablet 50 mg  50 mg Oral Q6H PRN Laverle Hobby, PA-C   50 mg at 04/13/18 2116  . lisinopril (PRINIVIL,ZESTRIL) tablet 5 mg  5 mg Oral Daily Sharma Covert, MD   5 mg at 04/14/18 7262  . magnesium hydroxide (MILK OF MAGNESIA) suspension 30 mL  30 mL  Oral Daily PRN Suella Broad, FNP      . metFORMIN (GLUCOPHAGE) tablet 1,000 mg  1,000 mg Oral BID WC Suella Broad, FNP   1,000 mg at 04/14/18 0355  . multivitamin with minerals tablet 1 tablet  1 tablet Oral Daily Suella Broad, FNP   1 tablet at 04/14/18 9741  . ondansetron (ZOFRAN) tablet 4 mg  4 mg Oral Q8H PRN Suella Broad, FNP      . pantoprazole (PROTONIX) EC tablet 40 mg  40 mg Oral Daily Suella Broad, FNP   40 mg at 04/14/18 6384  . QUEtiapine (SEROQUEL) tablet 25 mg  25 mg Oral Daily Sharma Covert, MD   25 mg at 04/14/18 0932  . QUEtiapine (SEROQUEL) tablet 50 mg  50 mg Oral QHS Sharma Covert, MD   50 mg at 04/13/18 2117  . risperiDONE (RISPERDAL M-TABS) disintegrating tablet 2 mg  2 mg Oral Q8H PRN Suella Broad, FNP   2 mg at 04/09/18 2006   And  . ziprasidone (GEODON) injection 20 mg  20 mg Intramuscular PRN Suella Broad, FNP      . traZODone (DESYREL) tablet 100 mg  100 mg Oral QHS PRN Jontez Redfield, Myer Peer, MD   100 mg at 04/13/18 2301    Lab Results:  Results for orders placed or performed during the hospital encounter of 04/06/18 (from the past 48 hour(s))  Glucose, capillary     Status: None   Collection Time: 04/12/18  8:37 PM  Result Value Ref Range   Glucose-Capillary 87 70 - 99 mg/dL    Blood Alcohol level:  Lab Results  Component Value Date   ETH <10 04/06/2018   ETH <10 67/05/4579    Metabolic Disorder Labs: Lab Results  Component Value Date   HGBA1C 5.6 04/07/2018   MPG 114.02 04/07/2018   MPG 432.59 12/09/2017   Lab Results  Component Value Date   PROLACTIN 16.1 (H) 04/07/2018   Lab Results  Component Value Date   CHOL 172 04/07/2018   TRIG 186 (H) 04/07/2018   HDL 31 (L) 04/07/2018   CHOLHDL 5.5 04/07/2018   VLDL 37 04/07/2018   LDLCALC 104 (H) 04/07/2018   LDLCALC 85 03/05/2017    Physical Findings: AIMS: Facial and Oral Movements Muscles of Facial Expression: None,  normal Lips and Perioral Area: None, normal Jaw: None, normal Tongue: None, normal,Extremity Movements Upper (arms, wrists, hands, fingers): None, normal Lower (legs, knees, ankles, toes): None, normal, Trunk Movements Neck, shoulders, hips: None, normal, Overall Severity Severity of abnormal movements (highest score from questions above): None, normal Incapacitation due to abnormal movements: None, normal Patient's awareness of abnormal movements (rate only patient's report): No Awareness, Dental Status Current problems with teeth and/or dentures?: No Does patient usually wear dentures?: No  CIWA:  CIWA-Ar Total: 2 COWS:  COWS Total Score: 0  Musculoskeletal: Strength & Muscle Tone: within normal limits Gait & Station: normal Patient leans: N/A  Psychiatric Specialty Exam: Physical Exam  Nursing note and vitals reviewed. Constitutional: He is oriented to person, place, and time. He appears well-developed and well-nourished.  Cardiovascular: Normal rate.  Respiratory: Effort normal.  Musculoskeletal: Normal range of motion.  Neurological: He is alert and oriented to person, place, and time.  Skin: Skin is warm.    Review of Systems  Constitutional: Negative.   HENT: Negative.   Eyes: Negative.   Respiratory: Negative.   Cardiovascular: Negative.   Gastrointestinal: Negative.   Genitourinary: Negative.   Musculoskeletal: Negative.   Skin: Negative.   Neurological: Negative.   Endo/Heme/Allergies: Negative.   Psychiatric/Behavioral: Positive for depression.    Blood pressure (!) 130/97, pulse 82, temperature 98 F (36.7 C), temperature source Oral, resp. rate 14, height 6' (1.829 m), weight 123.4 kg, SpO2 99 %.Body mass index is 36.89 kg/m.  General Appearance: Casual  Eye Contact:  Good  Speech:  Clear and Coherent and Normal Rate  Volume:  Normal  Mood:  Euthymic  Affect:  Congruent  Thought Process:  Goal Directed and Descriptions of Associations: Intact   Orientation:  Full (Time, Place, and Person)  Thought Content:  WDL  Suicidal Thoughts:  None currently but reports past SI thoughts  Homicidal Thoughts:  No  Memory:  Immediate;   Good Recent;   Good Remote;   Good  Judgement:  Fair  Insight:  Fair  Psychomotor Activity:  Normal  Concentration:  Concentration: Good and Attention Span: Good  Recall:  Good  Fund of Knowledge:  Good  Language:  Good  Akathisia:  No  Handed:  Right  AIMS (if indicated):     Assets:  Communication Skills Desire for Improvement Financial Resources/Insurance Housing Physical Health Social Support Transportation  ADL's:  Intact  Cognition:  WNL  Sleep:  Number of Hours: 6.25   Problems Addressed: Severe bipolar 1 disorder most recent episode depressed  Treatment Plan Summary: Daily contact with patient to assess and evaluate symptoms and progress in treatment, Medication management and Plan is to: Continue BuSpar 15 mg p.o. 3 times daily for anxiety Continue Luvox 150 mg p.o. nightly for mood stability Continue Vistaril 50 mg p.o. every 6 hours as needed for anxiety Continue Seroquel 25 mg p.o. daily for mood stability Continue Seroquel 50 mg p.o. nightly for mood stability Continue trazodone 100 mg p.o. nightly as needed for insomnia Continue agitation protocol risperidone M tabs 2 mg every 8 hours as needed and Geodon 20 mg IM injection as needed Encourage group therapy participation Attempt to contact financial advisor  Lewis Shock, FNP 04/14/2018, 2:42 PM    ..Agree with NP Progress Note

## 2018-04-15 MED ORDER — QUETIAPINE FUMARATE 100 MG PO TABS
100.0000 mg | ORAL_TABLET | Freq: Every day | ORAL | Status: DC
  Administered 2018-04-15 – 2018-04-17 (×3): 100 mg via ORAL
  Filled 2018-04-15 (×5): qty 1

## 2018-04-15 MED ORDER — TRAZODONE HCL 50 MG PO TABS
50.0000 mg | ORAL_TABLET | Freq: Every evening | ORAL | Status: DC | PRN
  Administered 2018-04-15 – 2018-04-18 (×4): 50 mg via ORAL
  Filled 2018-04-15 (×4): qty 1

## 2018-04-15 MED ORDER — QUETIAPINE FUMARATE 25 MG PO TABS
25.0000 mg | ORAL_TABLET | Freq: Two times a day (BID) | ORAL | Status: DC
  Administered 2018-04-15 – 2018-04-18 (×6): 25 mg via ORAL
  Filled 2018-04-15 (×8): qty 1

## 2018-04-15 NOTE — Progress Notes (Signed)
[facility] MD Progress Note  04/15/2018 3:29 PM Marc Long  MRN:  [id number]   Subjective: Patient is reporting partially improved mood but describes lingering depression, intermittent suicidal ideations, some neurovegetative symptoms of depression.  Denies medication side effects and states he feels medications are helping, albeit partially..   Objective: I have discussed case with treatment team and have met with patient. 42 year old male, history of chronic mental illness, has been diagnosed with bipolar disorder in the past.  Presented to the emergency room following overdose on prescribed medications.  Reports history of prior good response to ECT. Patient is presenting with some improvement compared to admission.  His affect is noted to be more reactive.  He does acknowledge feeling better.  However he continues to describe chronic depression and endorses intermittent suicidal ideations which are currently passive.  Denies any actual suicidal plan or intent and contracts for safety on unit.  Of note he describes the suicidal ideations as chronic and persistent and states "a good day is when I only have 5 or 6 suicidal thoughts a day".. Denies medication side effects-he is currently on Luvox, BuSpar, Seroquel.   He is visible on unit and has been more interactive , noted to be conversing with other patients. No disruptive or agitated behaviors on unit. Today we focused on treatment options.  As noted patient describes a history of good response to ECT, does not remember having had side effects.  He states that after discharge (received here for inpatient based ECT treatments) he was unable to continue outpatient ECT as recommended. He expresses interest in restarting this treatment modality if possible, although as above and he does state that medications seem to be "working". Principal Problem: Severe bipolar I disorder, most recent episode depressed ([place]) Diagnosis:   Patient Active  Problem List   Diagnosis Date Noted  . Schizophrenia ([place]) [F20.9] 04/06/2018  . Severe recurrent major depression without psychotic features ([place]) [F33.2] 12/16/2017  . MDD (major depressive disorder), recurrent episode, severe ([place]) [F33.2] 12/10/2017  . Obesity (BMI 30-39.9) [E66.9] 12/09/2017  . Severe bipolar I disorder, most recent episode depressed ([place]) [F31.4]   . DKA (diabetic ketoacidoses) ([place]) [E11.10] 12/05/2017  . Hypertriglyceridemia [E78.1] 02/05/2017  . DM (diabetes mellitus), type 2, uncontrolled ([place]) [E11.65] 12/12/2016  . Dehydration [E86.0] 12/12/2016  . Suicidal ideations [R45.851] 12/12/2016  . Bipolar disorder (manic depression) ([place]) [F31.9] 12/12/2016  . Hyperglycemia due to type 2 diabetes mellitus ([place]) [E11.65] 12/12/2016  . AKI (acute kidney injury) ([place]) [N17.9] 12/12/2016  . DKA, type 2, not at goal [facility]) [E11.10] 12/12/2016  . Obesity, Class III, BMI 40-49.9 (morbid obesity) ([place]) [E66.01]   . Type 2 diabetes mellitus without complication, without long-term current use of insulin ([place]) [E11.9] 12/10/2016  . Lithium use [Z79.899] 12/10/2016  . Moderate single current episode of major depressive disorder ([place]) [F32.1] 04/15/2016  . Pain in joint, shoulder region [M25.519] 04/14/2016  . Diverticulosis of large intestine without hemorrhage [K57.30] 11/07/2015  . Calculus of gallbladder without cholecystitis [K80.20] 11/07/2015   Total Time spent with patient: 20 minutes  Past Psychiatric History: See H&P  Past Medical History:  Past Medical History:  Diagnosis Date  . Depression   . Diabetes mellitus without complication ([place])   . Gallstones   . Obesity, Class III, BMI 40-49.9 (morbid obesity) ([place])    History reviewed. No pertinent surgical history. Family History:  Family History  Problem Relation Age of Onset  . Hypertension Mother   . Depression Mother   .  Heart attack Maternal Grandfather   . Lymphoma Maternal Grandmother   . Diabetes  Maternal Aunt   . Cancer Maternal Aunt    Family Psychiatric  History: See H&P Social History:  Social History   Substance and Sexual Activity  Alcohol Use Yes  . Alcohol/week: 0.0 standard drinks   Comment: once every 2 weeks. Wine coolers     Social History   Substance and Sexual Activity  Drug Use No    Social History   Socioeconomic History  . Marital status: Single    Spouse name: Not on file  . Number of children: 0  . Years of education: Not on file  . Highest education level: Not on file  Occupational History  . Occupation: call center  Social Needs  . Financial resource strain: Not on file  . Food insecurity:    Worry: Not on file    Inability: Not on file  . Transportation needs:    Medical: Not on file    Non-medical: Not on file  Tobacco Use  . Smoking status: Never Smoker  . Smokeless tobacco: Never Used  Substance and Sexual Activity  . Alcohol use: Yes    Alcohol/week: 0.0 standard drinks    Comment: once every 2 weeks. Wine coolers  . Drug use: No  . Sexual activity: Not Currently    Birth control/protection: None  Lifestyle  . Physical activity:    Days per week: Not on file    Minutes per session: Not on file  . Stress: Not on file  Relationships  . Social connections:    Talks on phone: Not on file    Gets together: Not on file    Attends religious service: Not on file    Active member of club or organization: Not on file    Attends meetings of clubs or organizations: Not on file    Relationship status: Not on file  Other Topics Concern  . Not on file  Social History Narrative  . Not on file   Additional Social History:   Sleep: Fair  Appetite:  Good  Current Medications: Current Facility-Administered Medications  Medication Dose Route Frequency Provider Last Rate Last Dose  . acetaminophen (TYLENOL) tablet 650 mg  650 mg Oral Q4H PRN Suella Broad, FNP      . alum & mag hydroxide-simeth (MAALOX/MYLANTA) 200-200-20  MG/5ML suspension 30 mL  30 mL Oral Q4H PRN Starkes-Perry, Gayland Curry, FNP      . busPIRone (BUSPAR) tablet 15 mg  15 mg Oral TID Sharma Covert, MD   15 mg at 04/15/18 1215  . fluvoxaMINE (LUVOX) tablet 150 mg  150 mg Oral QHS Sharma Covert, MD   150 mg at 04/14/18 2106  . hydrOXYzine (ATARAX/VISTARIL) tablet 50 mg  50 mg Oral Q6H PRN Laverle Hobby, PA-C   50 mg at 04/13/18 2116  . lisinopril (PRINIVIL,ZESTRIL) tablet 5 mg  5 mg Oral Daily Sharma Covert, MD   5 mg at 04/15/18 0817  . magnesium hydroxide (MILK OF MAGNESIA) suspension 30 mL  30 mL Oral Daily PRN Suella Broad, FNP      . metFORMIN (GLUCOPHAGE) tablet 1,000 mg  1,000 mg Oral BID WC Suella Broad, FNP   1,000 mg at 04/15/18 0817  . multivitamin with minerals tablet 1 tablet  1 tablet Oral Daily Suella Broad, FNP   1 tablet at 04/15/18 0817  . ondansetron (ZOFRAN) tablet 4 mg  4 mg  Oral Q8H PRN Suella Broad, FNP      . pantoprazole (PROTONIX) EC tablet 40 mg  40 mg Oral Daily Suella Broad, FNP   40 mg at 04/15/18 0817  . QUEtiapine (SEROQUEL) tablet 25 mg  25 mg Oral Daily Sharma Covert, MD   25 mg at 04/15/18 0817  . QUEtiapine (SEROQUEL) tablet 50 mg  50 mg Oral QHS Sharma Covert, MD   50 mg at 04/14/18 2107  . risperiDONE (RISPERDAL M-TABS) disintegrating tablet 2 mg  2 mg Oral Q8H PRN Suella Broad, FNP   2 mg at 04/09/18 2006   And  . ziprasidone (GEODON) injection 20 mg  20 mg Intramuscular PRN Suella Broad, FNP      . traZODone (DESYREL) tablet 100 mg  100 mg Oral QHS PRN Eran Windish, Myer Peer, MD   100 mg at 04/14/18 2339    Lab Results:  No results found for this or any previous visit (from the past 48 hour(s)).  Blood Alcohol level:  Lab Results  Component Value Date   ETH <10 04/06/2018   ETH <10 64/40/3474    Metabolic Disorder Labs: Lab Results  Component Value Date   HGBA1C 5.6 04/07/2018   MPG 114.02 04/07/2018   MPG  432.59 12/09/2017   Lab Results  Component Value Date   PROLACTIN 16.1 (H) 04/07/2018   Lab Results  Component Value Date   CHOL 172 04/07/2018   TRIG 186 (H) 04/07/2018   HDL 31 (L) 04/07/2018   CHOLHDL 5.5 04/07/2018   VLDL 37 04/07/2018   LDLCALC 104 (H) 04/07/2018   LDLCALC 85 03/05/2017    Physical Findings: AIMS: Facial and Oral Movements Muscles of Facial Expression: None, normal Lips and Perioral Area: None, normal Jaw: None, normal Tongue: None, normal,Extremity Movements Upper (arms, wrists, hands, fingers): None, normal Lower (legs, knees, ankles, toes): None, normal, Trunk Movements Neck, shoulders, hips: None, normal, Overall Severity Severity of abnormal movements (highest score from questions above): None, normal Incapacitation due to abnormal movements: None, normal Patient's awareness of abnormal movements (rate only patient's report): No Awareness, Dental Status Current problems with teeth and/or dentures?: No Does patient usually wear dentures?: No  CIWA:  CIWA-Ar Total: 2 COWS:  COWS Total Score: 0  Musculoskeletal: Strength & Muscle Tone: within normal limits Gait & Station: normal Patient leans: N/A  Psychiatric Specialty Exam: Physical Exam  Nursing note and vitals reviewed. Constitutional: He is oriented to person, place, and time. He appears well-developed and well-nourished.  Cardiovascular: Normal rate.  Respiratory: Effort normal.  Musculoskeletal: Normal range of motion.  Neurological: He is alert and oriented to person, place, and time.  Skin: Skin is warm.    Review of Systems  Constitutional: Negative.   HENT: Negative.   Eyes: Negative.   Respiratory: Negative.   Cardiovascular: Negative.   Gastrointestinal: Negative.   Genitourinary: Negative.   Musculoskeletal: Negative.   Skin: Negative.   Neurological: Negative.   Endo/Heme/Allergies: Negative.   Psychiatric/Behavioral: Positive for depression.  No chest pain, no  shortness of breath, no vomiting  Blood pressure 116/71, pulse 91, temperature 97.7 F (36.5 C), resp. rate 20, height 6' (1.829 m), weight 123.4 kg, SpO2 99 %.Body mass index is 36.89 kg/m.  General Appearance: Improving grooming  Eye Contact:  Good  Speech:  Normal Rate  Volume:  Normal  Mood:  Mood is improving, presents better than on admission.  Describes partially improved  Affect:  Vaguely anxious, more reactive,  smiles at times appropriately during session  Thought Process:  Goal Directed and Descriptions of Associations: Intact  Orientation:  Full (Time, Place, and Person)  Thought Content:  No current hallucinations, does not appear internally preoccupied, no delusions are expressed  Suicidal Thoughts:  Reports chronic history of suicidal ideations which she describes as recurrent.  At this time endorses passive suicidal ideations which are fleeting.  Does not endorse any actual plan or intention of hurting himself and contracts for safety on unit  Homicidal Thoughts:  No  Memory:  Recent and remote grossly intact  Judgement:  Fair  Insight:  Fair  Psychomotor Activity:  Normal  Concentration:  Concentration: Good and Attention Span: Good  Recall:  Good  Fund of Knowledge:  Good  Language:  Good  Akathisia:  No  Handed:  Right  AIMS (if indicated):     Assets:  Communication Skills Desire for Improvement Financial Resources/Insurance Housing Physical Health Social Support Transportation  ADL's:  Intact  Cognition:  WNL  Sleep:  Number of Hours: 4.5   Assessment -  42 year old male, history of chronic mental illness, has been diagnosed with bipolar disorder in the past.  Presented to the emergency room following overdose on prescribed medications.  Reports history of prior good response to ECT.  Patient is presenting with partial improvement.  Describes partially improved mood and affect does appear more reactive.  Describes decreased frequency of suicidal ideations  which she currently describes as chronic, fleeting, passive.  He is able to contract for safety on unit at this time.  He denies medication side effects.  He continues to express interest in ECT which she has had before with good response although reports financial/support constraints regarding ability to continue outpatient based ECT treatment. Treatment Plan Summary: Daily contact with patient to assess and evaluate symptoms and progress in treatment, Medication management and Plan is to:  Treatment plan reviewed as below today 10/31 Encourage group and milieu participation to work on coping skills and symptom reduction Continue BuSpar 15 mg p.o. 3 times daily for anxiety Continue Luvox 150 mg p.o. nightly for depression and anxiety Continue Vistaril 50 mg p.o. every 6 hours as needed for anxiety Increase Seroquel to 25 mg twice daily and 100 mg nightly for mood disorder Continue Trazodone 100 mg p.o. nightly as needed for insomnia Continue agitation protocol risperidone M tabs 2 mg every 8 hours as needed and Geodon 20 mg IM injection as needed Encourage group therapy participation Treatment team working on disposition planning.  With patient's consent will refer case to Dr. Weber Cooks at [facility] for potential inpatient ECT assessment.  Jenne Campus, MD 04/15/2018, 3:29 PM  Patient ID: [name], male   DOB: January 27, 1976, 42 y.o.   MRN: [id number]

## 2018-04-15 NOTE — [facility] Group Notes (Addendum)
Adult Psychoeducational Group Note  Date:  04/15/2018  Time:  4:00 PM  Group Topic/Focus: Recovery Goals/Barriers  Participation Level:  Active  Participation Quality:  Appropriate and Attentive  Affect:  Appropriate  Cognitive:  Alert and Oriented  Insight: Improving  Engagement in Group:  Developing/Improving  Modes of Intervention:  Discussion, Support and Education  Additional Comments:  Patient rated his energy level 4/10. Patient reports recovery to him is being well and out of the hospital. Patient contributed to the discussion and was supportive of peers.  Marc Long 04/15/2018 5:30 PM

## 2018-04-15 NOTE — Progress Notes (Signed)
Patient ID: Marc Long, male   DOB: [birth year], 42 y.o.   MRN: [id number]  Nursing Progress Note [id number]  Data: Patient presents with flat affect and is resting in bed on initial approach. Patient reports poor sleep last night. Patient compliant with scheduled medications. Patient denies pain/physical complaints. Patient completed self-inventory sheet and rates depression, hopelessness, and anxiety 5,5,5 respectively. Patient rates their sleep and appetite as poor/good respectively. Patient states goal for today is to "get good rest". Patient is seen attending groups and visible in the milieu. Patient currently reports passive SI with no plan or intent and denies HI/AVH.  Action: Patient is educated about and provided medication per provider's orders. Patient safety maintained with q15 min safety checks and frequent rounding. Low fall risk precautions in place. Emotional support given. 1:1 interaction and active listening provided. Patient encouraged to attend meals, groups, and work on treatment plan and goals. Labs, vital signs and patient behavior monitored throughout shift.   Response: Patient remains safe on the unit at this time and agrees to come to staff with any issues/concerns. Patient is interacting with peers appropriately on the unit. Will continue to support and monitor.

## 2018-04-15 NOTE — Progress Notes (Signed)
Pt observed this evening sitting in the dayroom watching TV and talking with other patients.  He reports that his day was ok, but could have been better.  He hopes that it can be worked out for him to start back on ECT treatments because they were helpful in the past.  He contracts for safety and denies HI/AVH.  He has been pleasant and appropriate with staff.  He makes his needs known to staff.  Support and encouragement offered.  Discharge plans are in process.  Safety maintained with q15 minute checks.

## 2018-04-15 NOTE — Plan of Care (Signed)
  Problem: Health Behavior/Discharge Planning: Goal: Compliance with treatment plan for underlying cause of condition will improve Outcome: Progressing  Patient is taking medications as prescribed and attending groups.   Problem: Safety: Goal: Periods of time without injury will increase Outcome: Progressing  Patient contracts for safety with staff.

## 2018-04-15 NOTE — [facility] Group Notes (Signed)
LCSW Group Therapy Note 04/15/2018 12:33 PM  Type of Therapy/Topic: Group Therapy: Emotion Regulation  Participation Level: Did Not Attend   Description of Group:  The purpose of this group is to assist patients in learning to regulate negative emotions and experience positive emotions. Patients will be guided to discuss ways in which they have been vulnerable to their negative emotions. These vulnerabilities will be juxtaposed with experiences of positive emotions or situations, and patients will be challenged to use positive emotions to combat negative ones. Special emphasis will be placed on coping with negative emotions in conflict situations, and patients will process healthy conflict resolution skills.  Therapeutic Goals: 1. Patient will identify two positive emotions or experiences to reflect on in order to balance out negative emotions 2. Patient will label two or more emotions that they find the most difficult to experience 3. Patient will demonstrate positive conflict resolution skills through discussion and/or role plays  Summary of Patient Progress:  Invited, chose not to attend.    Therapeutic Modalities:  Cognitive Behavioral Therapy Feelings Identification Dialectical Behavioral Therapy   Theresa Duty Clinical Social Worker

## 2018-04-16 LAB — GLUCOSE, CAPILLARY: Glucose-Capillary: 113 mg/dL — ABNORMAL HIGH (ref 70–99)

## 2018-04-16 NOTE — Progress Notes (Signed)
Pt was up at the NS at the beginning of the shift reporting that he was very anxious and having thoughts to self harm.  He had made some superficial scratches to his R hand and stated that he has hit the wall with his hand also.  Pt was requesting "something strong" for his anxiety and Ibuprofen for pain to his hand.  Just prior to the shift change, the previous RN had given pt Risperdal 2 mg for his anxiety.  When writer pointed this out, pt stated that he had told the previous RN that Risperdal does nothing for him.  He said he felt like no one was listening to him or giving him "what he wanted".  Writer spent some time talking with pt and gave him Tylenol as he does not have Ibuprofen ordered.  He was also given a cold pack for his hand.  We made a plan to give him his HS meds early along with prn Vistaril and Trazodone.  Pt was agreeable to the plan.  He was also given another ice pack for his hand.  Pt was encouraged to discuss his concerns with the doctor and CSW in the morning.  The rest of the time, pt was in the dayroom talking and laughing with a group of patients who were laughing and loud.  Pt went to bed after receiving his meds and having a snack. Support and encouragement offered.  Pt contracts for Probation officer.  He denies HI/AVH.  Discharge plans are in process.  Safety maintained with q15 minute checks.

## 2018-04-16 NOTE — Progress Notes (Signed)
Recreation Therapy Notes  Date: 11.1.19 Time: 0930 Location: 300 [address] Dayroom  Group Topic: Stress Management  Goal Area(s) Addresses:  Patient will verbalize importance of using healthy stress management.  Patient will identify positive emotions associated with healthy stress management.   Intervention: Stress Management  Activity :  Guided Imagery.  LRT introduced the stress management technique of guided imagery.  LRT read a script allowing patients to visualize being in a peaceful meadow.  Patients were to listen as script was read to engage in the meditation.  Education:  Stress Management, Discharge Planning.   Education Outcome: Acknowledges edcuation/In group clarification offered/Needs additional education  Clinical Observations/Feedback: Pt did not attend group.      Victorino Sparrow, LRT/CTRS         Victorino Sparrow A 04/16/2018 11:47 AM

## 2018-04-16 NOTE — Progress Notes (Signed)
Pt presents with a flat affect and depressed mood. Pt rated on his self inventory sheet depression 4/10, anxiety 6/10, and hopelessness 4/10. Pt endorses SI with no plan or intent. Pt reported improved sleep last night. Pt compliant with taking meds and denies any side effects. Pt stated goal "attend all groups".   Medications administered as ordered per MD. Verbal support provided. Pt encouraged to attend groups. 15 minute checks performed for safety.   Pt receptive to tx plan.

## 2018-04-16 NOTE — Plan of Care (Signed)
  Problem: Coping: Goal: Ability to demonstrate self-control will improve Outcome: Progressing   Problem: Coping: Goal: Will verbalize feelings Outcome: Progressing

## 2018-04-16 NOTE — Progress Notes (Signed)
Adult Psychoeducational Group Note  Date:  04/16/2018 Time:  8:41 PM  Group Topic/Focus:  Wrap-Up Group:   The focus of this group is to help patients review their daily goal of treatment and discuss progress on daily workbooks.  Participation Level:  Active  Participation Quality:  Appropriate  Affect:  Appropriate  Cognitive:  Appropriate  Insight: Appropriate  Engagement in Group:  Engaged  Modes of Intervention:  Discussion  Additional Comments:  Patient attended group and participated.  Sam Overbeck W Antonette Hendricks 27/06/2927, 8:41 PM

## 2018-04-16 NOTE — [facility] Group Notes (Signed)
[facility] Group Notes:  (Nursing/MHT/Case Management/Adjunct)  Date:  04/16/2018  Time:  4:00 pm  Type of Therapy:  Psychoeducational Skills  Participation Level:  Active  Participation Quality:  Appropriate  Affect:  Appropriate  Cognitive:  Appropriate  Insight:  Appropriate  Engagement in Group:  Engaged  Modes of Intervention:  Discussion and Education  Summary of Progress/Problems: Patient attended group, was alert, and interacted appropriately.     Cammy Copa 04/16/2018, 6:33 PM

## 2018-04-16 NOTE — Progress Notes (Addendum)
[facility] MD Progress Note  04/16/2018 3:25 PM Marc Long  MRN:  [id number]   Subjective: reports partially improving mood . Reports passive SI, but acknowledges that they have decreased in frequency, and currently denies suicidal plan or intention. He is future oriented, and focusing more on disposition planning. Denies medication side effects.    Objective: I have discussed case with treatment team and have met with patient. 42 year old male, history of chronic mental illness, has been diagnosed with bipolar disorder in the past.  Presented to the emergency room following overdose on prescribed medications.  Reports history of prior good response to ECT. He continues to present with improving mood , and affect is more reactive. Smiles at times appropriately. He does endorse chronic depression and chronic suicidal ideations, which he states have decreased in frequency and intensity since admission. Currently contracts for safety on unit.  Patient reported episode of increased anxiety, agitation yesterday, made superficial scratch to his hand. At this time presents calm, pleasant, states feeling " better". Staff reports he has been visible in day room, with bright, full range of affect , interactive with peers . Some group attendance, no disruptive behavior. He is currently on Luvox, Buspar, Seroquel. Tolerating these medications well and states he feels they are working . I have spoken with Dr. Weber Cooks ( from [facility]) regarding potential ECT management . Patient has history of prior inpatient ECT but was unable to follow through with outpatient ECT treatments . As per Dr. Weber Cooks, ECT may be an option if severe  depression worsens or persists but currently would continue medication management as patient is improving and exhibiting improving mood and affect on medication regimen. * DM currently being managed with Glucophage, which he is tolerating well . 10/23 Hgb A1C 5.6, CBG today 113 (  non fasting).  Principal Problem: Severe bipolar I disorder, most recent episode depressed ([place]) Diagnosis:   Patient Active Problem List   Diagnosis Date Noted  . Schizophrenia ([place]) [F20.9] 04/06/2018  . Severe recurrent major depression without psychotic features ([place]) [F33.2] 12/16/2017  . MDD (major depressive disorder), recurrent episode, severe ([place]) [F33.2] 12/10/2017  . Obesity (BMI 30-39.9) [E66.9] 12/09/2017  . Severe bipolar I disorder, most recent episode depressed ([place]) [F31.4]   . DKA (diabetic ketoacidoses) ([place]) [E11.10] 12/05/2017  . Hypertriglyceridemia [E78.1] 02/05/2017  . DM (diabetes mellitus), type 2, uncontrolled ([place]) [E11.65] 12/12/2016  . Dehydration [E86.0] 12/12/2016  . Suicidal ideations [R45.851] 12/12/2016  . Bipolar disorder (manic depression) ([place]) [F31.9] 12/12/2016  . Hyperglycemia due to type 2 diabetes mellitus ([place]) [E11.65] 12/12/2016  . AKI (acute kidney injury) ([place]) [N17.9] 12/12/2016  . DKA, type 2, not at goal [facility]) [E11.10] 12/12/2016  . Obesity, Class III, BMI 40-49.9 (morbid obesity) ([place]) [E66.01]   . Type 2 diabetes mellitus without complication, without long-term current use of insulin ([place]) [E11.9] 12/10/2016  . Lithium use [Z79.899] 12/10/2016  . Moderate single current episode of major depressive disorder ([place]) [F32.1] 04/15/2016  . Pain in joint, shoulder region [M25.519] 04/14/2016  . Diverticulosis of large intestine without hemorrhage [K57.30] 11/07/2015  . Calculus of gallbladder without cholecystitis [K80.20] 11/07/2015   Total Time spent with patient: 20 minutes  Past Psychiatric History: See H&P  Past Medical History:  Past Medical History:  Diagnosis Date  . Depression   . Diabetes mellitus without complication ([place])   . Gallstones   . Obesity, Class III, BMI 40-49.9 (morbid obesity) ([place])    History reviewed. No pertinent surgical history. Family  History:  Family History  Problem Relation Age of Onset  .  Hypertension Mother   . Depression Mother   . Heart attack Maternal Grandfather   . Lymphoma Maternal Grandmother   . Diabetes Maternal Aunt   . Cancer Maternal Aunt    Family Psychiatric  History: See H&P Social History:  Social History   Substance and Sexual Activity  Alcohol Use Yes  . Alcohol/week: 0.0 standard drinks   Comment: once every 2 weeks. Wine coolers     Social History   Substance and Sexual Activity  Drug Use No    Social History   Socioeconomic History  . Marital status: Single    Spouse name: Not on file  . Number of children: 0  . Years of education: Not on file  . Highest education level: Not on file  Occupational History  . Occupation: call center  Social Needs  . Financial resource strain: Not on file  . Food insecurity:    Worry: Not on file    Inability: Not on file  . Transportation needs:    Medical: Not on file    Non-medical: Not on file  Tobacco Use  . Smoking status: Never Smoker  . Smokeless tobacco: Never Used  Substance and Sexual Activity  . Alcohol use: Yes    Alcohol/week: 0.0 standard drinks    Comment: once every 2 weeks. Wine coolers  . Drug use: No  . Sexual activity: Not Currently    Birth control/protection: None  Lifestyle  . Physical activity:    Days per week: Not on file    Minutes per session: Not on file  . Stress: Not on file  Relationships  . Social connections:    Talks on phone: Not on file    Gets together: Not on file    Attends religious service: Not on file    Active member of club or organization: Not on file    Attends meetings of clubs or organizations: Not on file    Relationship status: Not on file  Other Topics Concern  . Not on file  Social History Narrative  . Not on file   Additional Social History:   Sleep: Good  Appetite:  Good  Current Medications: Current Facility-Administered Medications  Medication Dose Route Frequency Provider Last Rate Last Dose  . acetaminophen  (TYLENOL) tablet 650 mg  650 mg Oral Q4H PRN Suella Broad, FNP   650 mg at 04/15/18 1953  . alum & mag hydroxide-simeth (MAALOX/MYLANTA) 200-200-20 MG/5ML suspension 30 mL  30 mL Oral Q4H PRN Starkes-Perry, Gayland Curry, FNP      . busPIRone (BUSPAR) tablet 15 mg  15 mg Oral TID Sharma Covert, MD   15 mg at 04/16/18 1212  . fluvoxaMINE (LUVOX) tablet 150 mg  150 mg Oral QHS Sharma Covert, MD   150 mg at 04/15/18 2112  . hydrOXYzine (ATARAX/VISTARIL) tablet 50 mg  50 mg Oral Q6H PRN Laverle Hobby, PA-C   50 mg at 04/15/18 2113  . lisinopril (PRINIVIL,ZESTRIL) tablet 5 mg  5 mg Oral Daily Sharma Covert, MD   5 mg at 04/16/18 0803  . magnesium hydroxide (MILK OF MAGNESIA) suspension 30 mL  30 mL Oral Daily PRN Suella Broad, FNP      . metFORMIN (GLUCOPHAGE) tablet 1,000 mg  1,000 mg Oral BID WC Suella Broad, FNP   1,000 mg at 04/16/18 0803  . multivitamin with minerals tablet 1 tablet  1 tablet Oral Daily Suella Broad, FNP   1 tablet at 04/16/18 0076  . ondansetron (ZOFRAN) tablet 4 mg  4 mg Oral Q8H PRN Suella Broad, FNP      . pantoprazole (PROTONIX) EC tablet 40 mg  40 mg Oral Daily Suella Broad, FNP   40 mg at 04/16/18 0803  . QUEtiapine (SEROQUEL) tablet 100 mg  100 mg Oral QHS , Myer Peer, MD   100 mg at 04/15/18 2113  . QUEtiapine (SEROQUEL) tablet 25 mg  25 mg Oral BID , Myer Peer, MD   25 mg at 04/16/18 0803  . risperiDONE (RISPERDAL M-TABS) disintegrating tablet 2 mg  2 mg Oral Q8H PRN Suella Broad, FNP   2 mg at 04/15/18 1820   And  . ziprasidone (GEODON) injection 20 mg  20 mg Intramuscular PRN Suella Broad, FNP      . traZODone (DESYREL) tablet 50 mg  50 mg Oral QHS PRN , Myer Peer, MD   50 mg at 04/15/18 2113    Lab Results:  No results found for this or any previous visit (from the past 48 hour(s)).  Blood Alcohol level:  Lab Results  Component Value Date   ETH <10  04/06/2018   ETH <10 22/63/3354    Metabolic Disorder Labs: Lab Results  Component Value Date   HGBA1C 5.6 04/07/2018   MPG 114.02 04/07/2018   MPG 432.59 12/09/2017   Lab Results  Component Value Date   PROLACTIN 16.1 (H) 04/07/2018   Lab Results  Component Value Date   CHOL 172 04/07/2018   TRIG 186 (H) 04/07/2018   HDL 31 (L) 04/07/2018   CHOLHDL 5.5 04/07/2018   VLDL 37 04/07/2018   LDLCALC 104 (H) 04/07/2018   LDLCALC 85 03/05/2017    Physical Findings: AIMS: Facial and Oral Movements Muscles of Facial Expression: None, normal Lips and Perioral Area: None, normal Jaw: None, normal Tongue: None, normal,Extremity Movements Upper (arms, wrists, hands, fingers): None, normal Lower (legs, knees, ankles, toes): None, normal, Trunk Movements Neck, shoulders, hips: None, normal, Overall Severity Severity of abnormal movements (highest score from questions above): None, normal Incapacitation due to abnormal movements: None, normal Patient's awareness of abnormal movements (rate only patient's report): No Awareness, Dental Status Current problems with teeth and/or dentures?: No Does patient usually wear dentures?: No  CIWA:  CIWA-Ar Total: 2 COWS:  COWS Total Score: 0  Musculoskeletal: Strength & Muscle Tone: within normal limits Gait & Station: normal Patient leans: N/A  Psychiatric Specialty Exam: Physical Exam  Nursing note and vitals reviewed. Constitutional: He is oriented to person, place, and time. He appears well-developed and well-nourished.  Cardiovascular: Normal rate.  Respiratory: Effort normal.  Musculoskeletal: Normal range of motion.  Neurological: He is alert and oriented to person, place, and time.  Skin: Skin is warm.    Review of Systems  Constitutional: Negative.   HENT: Negative.   Eyes: Negative.   Respiratory: Negative.   Cardiovascular: Negative.   Gastrointestinal: Negative.   Genitourinary: Negative.   Musculoskeletal: Negative.    Skin: Negative.   Neurological: Negative.   Endo/Heme/Allergies: Negative.   Psychiatric/Behavioral: Positive for depression.  No chest pain, no shortness of breath, no vomiting  Blood pressure 118/69, pulse 98, temperature 97.6 F (36.4 C), temperature source Oral, resp. rate 20, height 6' (1.829 m), weight 123.4 kg, SpO2 99 %.Body mass index is 36.89 kg/m.  General Appearance: Well Groomed  Eye Contact:  Good  Speech:  Normal Rate  Volume:  Normal  Mood:  improving mood , reports he is feeling better   Affect:  more reactive, smiles appropriately during session  Thought Process:  Goal Directed and Descriptions of Associations: Intact  Orientation:  Full (Time, Place, and Person)  Thought Content:  no hallucinations,no delusions   Suicidal Thoughts:  Yes.  without intent/plan- describes chronic suicidal ideations, improving - less frequent, less severe. Currently denies suicidal plan or intent.  Homicidal Thoughts:  No  Memory:  Recent and remote grossly intact  Judgement:  Fair  Insight:  Fair  Psychomotor Activity:  Normal  Concentration:  Concentration: Good and Attention Span: Good  Recall:  Good  Fund of Knowledge:  Good  Language:  Good  Akathisia:  No  Handed:  Right  AIMS (if indicated):     Assets:  Communication Skills Desire for Improvement Financial Resources/Insurance Housing Physical Health Social Support Transportation  ADL's:  Intact  Cognition:  WNL  Sleep:  Number of Hours: 6.75   Assessment -  42 year old male, history of chronic mental illness, has been diagnosed with bipolar disorder in the past.  Presented to the emergency room following overdose on prescribed medications.  Reports history of prior good response to ECT.  He presents with gradually improving mood and a more reactive, fuller range of affect. Reports chronic suicidal ideations, which he currently describes as passive and improving /less frequent. At this time does not endorse violent  or homicidal ideations. He is tolerating medications well thus far. On Seroquel, Luvox, Buspar. Treatment Plan Summary: Daily contact with patient to assess and evaluate symptoms and progress in treatment, Medication management and Plan is to:  Treatment plan reviewed as below today 10/31 Encourage group and milieu participation to work on coping skills and symptom reduction Continue BuSpar 15 mg p.o. 3 times daily for anxiety Continue Luvox 150 mg p.o. nightly for depression and anxiety Continue Vistaril 50 mg p.o. every 6 hours as needed for anxiety Continue  Seroquel to 25 mg twice daily and 100 mg nightly for mood disorder Continue Trazodone 100 mg p.o. nightly as needed for insomnia Continue agitation protocol risperidone M tabs 2 mg every 8 hours as needed and Geodon 20 mg IM injection as needed Encourage group therapy participation   Jenne Campus, MD 04/16/2018, 3:25 PM  Patient ID: Marc Long, male   DOB: [birth year], 42 y.o.   MRN: [id number]

## 2018-04-16 NOTE — Plan of Care (Signed)
D: Pt denies HI/AV hallucinations. Pt is pleasant and cooperative. Patient is passive SI without a plan. Patient contracts for safety. Patient unable to identify triggers for depression.  A: Pt was offered support and encouragement. Pt was given scheduled medications. Pt was encourage to attend groups. Q 15 minute checks were done for safety.  R:Pt attends groups and interacts well with peers and staff. Pt is taking medication. Pt has no complaints.Pt receptive to treatment and safety maintained on unit.    Problem: Safety: Goal: Periods of time without injury will increase Outcome: Progressing   Problem: Health Behavior/Discharge Planning: Goal: Compliance with therapeutic regimen will improve Outcome: Progressing   Problem: Safety: Goal: Ability to disclose and discuss suicidal ideas will improve Outcome: Progressing

## 2018-04-16 NOTE — Tx Team (Signed)
Interdisciplinary Treatment and Diagnostic Plan Update  04/16/2018 Time of Session: 9:55am Marc Long MRN: [id number]  Principal Diagnosis: Severe bipolar I disorder, most recent episode depressed ([place])  Secondary Diagnoses: Principal Problem:   Severe bipolar I disorder, most recent episode depressed ([place]) Active Problems:   Schizophrenia ([place])   Current Medications:  Current Facility-Administered Medications  Medication Dose Route Frequency Provider Last Rate Last Dose  . acetaminophen (TYLENOL) tablet 650 mg  650 mg Oral Q4H PRN Suella Broad, FNP   650 mg at 04/15/18 1953  . alum & mag hydroxide-simeth (MAALOX/MYLANTA) 200-200-20 MG/5ML suspension 30 mL  30 mL Oral Q4H PRN Starkes-Perry, Gayland Curry, FNP      . busPIRone (BUSPAR) tablet 15 mg  15 mg Oral TID Sharma Covert, MD   15 mg at 04/16/18 0803  . fluvoxaMINE (LUVOX) tablet 150 mg  150 mg Oral QHS Sharma Covert, MD   150 mg at 04/15/18 2112  . hydrOXYzine (ATARAX/VISTARIL) tablet 50 mg  50 mg Oral Q6H PRN Laverle Hobby, PA-C   50 mg at 04/15/18 2113  . lisinopril (PRINIVIL,ZESTRIL) tablet 5 mg  5 mg Oral Daily Sharma Covert, MD   5 mg at 04/16/18 0803  . magnesium hydroxide (MILK OF MAGNESIA) suspension 30 mL  30 mL Oral Daily PRN Suella Broad, FNP      . metFORMIN (GLUCOPHAGE) tablet 1,000 mg  1,000 mg Oral BID WC Suella Broad, FNP   1,000 mg at 04/16/18 0803  . multivitamin with minerals tablet 1 tablet  1 tablet Oral Daily Suella Broad, FNP   1 tablet at 04/16/18 0803  . ondansetron (ZOFRAN) tablet 4 mg  4 mg Oral Q8H PRN Suella Broad, FNP      . pantoprazole (PROTONIX) EC tablet 40 mg  40 mg Oral Daily Suella Broad, FNP   40 mg at 04/16/18 0803  . QUEtiapine (SEROQUEL) tablet 100 mg  100 mg Oral QHS Cobos, Myer Peer, MD   100 mg at 04/15/18 2113  . QUEtiapine (SEROQUEL) tablet 25 mg  25 mg Oral BID Cobos, Myer Peer, MD   25 mg at 04/16/18  0803  . risperiDONE (RISPERDAL M-TABS) disintegrating tablet 2 mg  2 mg Oral Q8H PRN Suella Broad, FNP   2 mg at 04/15/18 1820   And  . ziprasidone (GEODON) injection 20 mg  20 mg Intramuscular PRN Suella Broad, FNP      . traZODone (DESYREL) tablet 50 mg  50 mg Oral QHS PRN Cobos, Myer Peer, MD   50 mg at 04/15/18 2113   PTA Medications: Medications Prior to Admission  Medication Sig Dispense Refill Last Dose  . busPIRone (BUSPAR) 10 MG tablet Take 1 tablet (10 mg total) by mouth 3 (three) times daily. 90 tablet 1 04/04/2018  . FLUoxetine (PROZAC) 40 MG capsule Take 1 capsule (40 mg total) by mouth daily. 30 capsule 1 04/04/2018  . hydrOXYzine (ATARAX/VISTARIL) 50 MG tablet Take 1 tablet (50 mg total) by mouth 3 (three) times daily as needed for anxiety. (Patient taking differently: Take 50 mg by mouth once. ) 30 tablet 1 04/05/2018 at Unknown time  . metFORMIN (GLUCOPHAGE) 1000 MG tablet Take 1 tablet (1,000 mg total) by mouth 2 (two) times daily with a meal. 60 tablet 1 04/04/2018  . Multiple Vitamin (MULTIVITAMIN WITH MINERALS) TABS tablet Take 1 tablet by mouth daily. 30 tablet 0 04/04/2018 at Unknown time  . pantoprazole (PROTONIX) 40  MG tablet Take 1 tablet (40 mg total) by mouth daily. 30 tablet 1 04/04/2018 at Unknown time  . polyethylene glycol (MIRALAX / GLYCOLAX) packet Take 17 g by mouth daily. (Patient not taking: Reported on 04/06/2018) 30 each 1 Not Taking at Unknown time  . QUEtiapine (SEROQUEL) 100 MG tablet Take 1 tablet (100 mg total) by mouth at bedtime. 30 tablet 1 04/05/2018 at Unknown time  . traZODone (DESYREL) 100 MG tablet Take 1 tablet (100 mg total) by mouth at bedtime as needed for sleep. 30 tablet 1 04/05/2018 at Unknown time    Patient Stressors: Medication change or noncompliance  Patient Strengths: Ability for insight Communication skills Supportive family/friends  Treatment Modalities: Medication Management, Group therapy, Case  management,  1 to 1 session with clinician, Psychoeducation, Recreational therapy.   Physician Treatment Plan for Primary Diagnosis: Severe bipolar I disorder, most recent episode depressed ([place]) Long Term Goal(s): Improvement in symptoms so as ready for discharge Improvement in symptoms so as ready for discharge   Short Term Goals: Ability to identify changes in lifestyle to reduce recurrence of condition will improve Ability to verbalize feelings will improve Ability to disclose and discuss suicidal ideas Ability to demonstrate self-control will improve Ability to identify and develop effective coping behaviors will improve Ability to maintain clinical measurements within normal limits will improve Ability to identify changes in lifestyle to reduce recurrence of condition will improve Ability to verbalize feelings will improve Ability to disclose and discuss suicidal ideas Ability to demonstrate self-control will improve Ability to identify and develop effective coping behaviors will improve Ability to maintain clinical measurements within normal limits will improve  Medication Management: Evaluate patient's response, side effects, and tolerance of medication regimen.  Therapeutic Interventions: 1 to 1 sessions, Unit Group sessions and Medication administration.  Evaluation of Outcomes: Progressing  Physician Treatment Plan for Secondary Diagnosis: Principal Problem:   Severe bipolar I disorder, most recent episode depressed ([place]) Active Problems:   Schizophrenia ([place])  Long Term Goal(s): Improvement in symptoms so as ready for discharge Improvement in symptoms so as ready for discharge   Short Term Goals: Ability to identify changes in lifestyle to reduce recurrence of condition will improve Ability to verbalize feelings will improve Ability to disclose and discuss suicidal ideas Ability to demonstrate self-control will improve Ability to identify and develop effective  coping behaviors will improve Ability to maintain clinical measurements within normal limits will improve Ability to identify changes in lifestyle to reduce recurrence of condition will improve Ability to verbalize feelings will improve Ability to disclose and discuss suicidal ideas Ability to demonstrate self-control will improve Ability to identify and develop effective coping behaviors will improve Ability to maintain clinical measurements within normal limits will improve     Medication Management: Evaluate patient's response, side effects, and tolerance of medication regimen.  Therapeutic Interventions: 1 to 1 sessions, Unit Group sessions and Medication administration.  Evaluation of Outcomes: Progressing   RN Treatment Plan for Primary Diagnosis: Severe bipolar I disorder, most recent episode depressed ([place]) Long Term Goal(s): Knowledge of disease and therapeutic regimen to maintain health will improve  Short Term Goals: Ability to participate in decision making will improve, Ability to verbalize feelings will improve, Ability to disclose and discuss suicidal ideas, Ability to identify and develop effective coping behaviors will improve and Compliance with prescribed medications will improve  Medication Management: RN will administer medications as ordered by provider, will assess and evaluate patient's response and provide education to patient for prescribed  medication. RN will report any adverse and/or side effects to prescribing provider.  Therapeutic Interventions: 1 on 1 counseling sessions, Psychoeducation, Medication administration, Evaluate responses to treatment, Monitor vital signs and CBGs as ordered, Perform/monitor CIWA, COWS, AIMS and Fall Risk screenings as ordered, Perform wound care treatments as ordered.  Evaluation of Outcomes: Progressing   LCSW Treatment Plan for Primary Diagnosis: Severe bipolar I disorder, most recent episode depressed ([place]) Long Term  Goal(s): Safe transition to appropriate next level of care at discharge, Engage patient in therapeutic group addressing interpersonal concerns.  Short Term Goals: Engage patient in aftercare planning with referrals and resources  Therapeutic Interventions: Assess for all discharge needs, 1 to 1 time with Social worker, Explore available resources and support systems, Assess for adequacy in community support network, Educate family and significant other(s) on suicide prevention, Complete Psychosocial Assessment, Interpersonal group therapy.  Evaluation of Outcomes: Progressing   Progress in Treatment: Attending groups: Yes. Participating in groups: Yes. Taking medication as prescribed: Yes. Toleration medication: Yes. Family/Significant other contact made: No, will contact:  the patient's mother Patient understands diagnosis: Yes. Discussing patient identified problems/goals with staff: Yes. Medical problems stabilized or resolved: Yes. Denies suicidal/homicidal ideation: No. Issues/concerns per patient self-inventory: No. Other:   New problem(s) identified: None   New Short Term/Long Term Goal(s): medication stabilization, elimination of SI thoughts, development of comprehensive mental wellness plan.   Patient Goals:  "I really want to get back into ECT"  Discharge Plan or Barriers: Patient plans to discharge home with his family and continue to follow up with [facility] for outpatient medication management and therapy services. Patient being reviewed for possible ECT at [facility].   Reason for Continuation of Hospitalization: Depression Medication stabilization Suicidal ideation  Estimated Length of Stay: 1-2 days   Attendees: Patient: Marc Long  04/16/2018 11:26 AM  Physician: Dr. Myles Lipps, MD; Dr. Neita Garnet, MD 04/16/2018 11:26 AM  Nursing: Danae Chen.C, RN; Chrys Racer.B, RN; Patrice.Viona Gilmore, RN 04/16/2018 11:26 AM  RN Care Manager:X 04/16/2018 11:26 AM  Social Worker: Radonna Ricker,  [place] 04/16/2018 11:26 AM  Recreational Therapist: Rhunette Croft 04/16/2018 11:26 AM  Other: Marvia Pickles, NP 04/16/2018 11:26 AM  Other: Ricky Ala, NP 04/16/2018 11:26 AM  Other:Aggie Pat Patrick, NP 04/16/2018 11:26 AM    Scribe for Treatment Team: Marylee Floras, [place] 04/16/2018 11:26 AM

## 2018-04-17 MED ORDER — FLUVOXAMINE MALEATE 100 MG PO TABS
100.0000 mg | ORAL_TABLET | Freq: Two times a day (BID) | ORAL | Status: DC
  Administered 2018-04-17 – 2018-04-19 (×4): 100 mg via ORAL
  Filled 2018-04-17 (×8): qty 1

## 2018-04-17 NOTE — Progress Notes (Signed)
D. Pt presents with an improving mood- calm, cooperative behavior- observed interacting appropriately with peers in the dayroom and attending group. Per pt's self inventory, pt rates his depression, hopelessness and anxiety a 6/3/5, respectively. Pt writes that his most important goal today is "to find some new insight in group" and "focus on what's being said".  Pt currently denies SI/HI and AVH and agrees to contact staff before acting on any harmful thoughts.  A. Labs and vitals monitored. Pt compliant with medications. Pt supported emotionally and encouraged to express concerns and ask questions.   R. Pt remains safe with 15 minute checks. Will continue POC.

## 2018-04-17 NOTE — [facility] Group Notes (Signed)
Adult Psychoeducational Group Note  Date:  04/17/2018 Time:  10:27 PM  Group Topic/Focus:  Wrap-Up Group:   The focus of this group is to help patients review their daily goal of treatment and discuss progress on daily workbooks.  Participation Level:  Active  Participation Quality:  Appropriate and Attentive  Affect:  Appropriate  Cognitive:  Alert and Appropriate  Insight: Appropriate and Good  Engagement in Group:  Engaged  Modes of Intervention:  Discussion and Education  Additional Comments:  Pt attended and participated in wrap up group this evening. Pt rated their day a 9/10, due to them feeling good today and they only had two "visualizations" (SI) today. Pt told writer that they typically have 10+ visualizations/day, but today was less. Pt goal for tomorrow is to have another day like today.   Cristi Loron 04/17/2018, 10:27 PM

## 2018-04-17 NOTE — Progress Notes (Signed)
EKG results placed on the outside of shadow chart NSR- Normal ECG  QT/QTc :   400/406 ms

## 2018-04-17 NOTE — Progress Notes (Signed)
[facility] MD Progress Note  04/17/2018 11:19 AM Marc Long  MRN:  [id number]   Subjective: Reports some improvement compared to admission- mood is described as improving, and reports ongoing decrease in frequency and intensity of suicidal ideations, which he reports as visual thoughts, " images " of him hurting self. States " I have not had any thus far today". Reports feeling vaguely anxious.  Denies medication side effects.    Objective: I have reviewed chart notes  and have met with patient. 42, single , no children, lives with roommate, unemployed. History of depression, anxiety. Has been diagnosed with Bipolar Disorder in the past, but describes mostly depression, which he characterizes as chronic. Does endorse some hypomanic type episodes as a teenager and young adult . He describes history of anxiety, and describes  repetitive self injurious ideations which he describes as obsessive. Denies psychosis. History of good response to ECT in the past, but states was unable to continue outpatient ECT following discharge earlier this year. His mood has been improving gradually compared to admission. Affect reactive, vaguely anxious. Nursing notes indicate he has continued to endorse passive SI, but at this time denies suicidal ideations and states " I am not feeling that way today, I have had no thoughts of hurting myself today".States " I am feeling happy I am alive today". Visible on unit, going to groups. He is tolerating medications well , denies side effects.    Principal Problem: Severe bipolar I disorder, most recent episode depressed ([place]) Diagnosis:   Patient Active Problem List   Diagnosis Date Noted  . Schizophrenia ([place]) [F20.9] 04/06/2018  . Severe recurrent major depression without psychotic features ([place]) [F33.2] 12/16/2017  . MDD (major depressive disorder), recurrent episode, severe ([place]) [F33.2] 12/10/2017  . Obesity (BMI 30-39.9) [E66.9] 12/09/2017  . Severe bipolar I  disorder, most recent episode depressed ([place]) [F31.4]   . DKA (diabetic ketoacidoses) ([place]) [E11.10] 12/05/2017  . Hypertriglyceridemia [E78.1] 02/05/2017  . DM (diabetes mellitus), type 2, uncontrolled ([place]) [E11.65] 12/12/2016  . Dehydration [E86.0] 12/12/2016  . Suicidal ideations [R45.851] 12/12/2016  . Bipolar disorder (manic depression) ([place]) [F31.9] 12/12/2016  . Hyperglycemia due to type 2 diabetes mellitus ([place]) [E11.65] 12/12/2016  . AKI (acute kidney injury) ([place]) [N17.9] 12/12/2016  . DKA, type 2, not at goal [facility]) [E11.10] 12/12/2016  . Obesity, Class III, BMI 40-49.9 (morbid obesity) ([place]) [E66.01]   . Type 2 diabetes mellitus without complication, without long-term current use of insulin ([place]) [E11.9] 12/10/2016  . Lithium use [Z79.899] 12/10/2016  . Moderate single current episode of major depressive disorder ([place]) [F32.1] 04/15/2016  . Pain in joint, shoulder region [M25.519] 04/14/2016  . Diverticulosis of large intestine without hemorrhage [K57.30] 11/07/2015  . Calculus of gallbladder without cholecystitis [K80.20] 11/07/2015   Total Time spent with patient: 20 minutes  Past Psychiatric History: See H&P  Past Medical History:  Past Medical History:  Diagnosis Date  . Depression   . Diabetes mellitus without complication ([place])   . Gallstones   . Obesity, Class III, BMI 40-49.9 (morbid obesity) ([place])    History reviewed. No pertinent surgical history. Family History:  Family History  Problem Relation Age of Onset  . Hypertension Mother   . Depression Mother   . Heart attack Maternal Grandfather   . Lymphoma Maternal Grandmother   . Diabetes Maternal Aunt   . Cancer Maternal Aunt    Family Psychiatric  History: See H&P Social History:  Social History   Substance and Sexual Activity  Alcohol Use Yes  . Alcohol/week: 0.0 standard drinks   Comment: once every 2 weeks. Wine coolers     Social History   Substance and Sexual Activity  Drug Use No     Social History   Socioeconomic History  . Marital status: Single    Spouse name: Not on file  . Number of children: 0  . Years of education: Not on file  . Highest education level: Not on file  Occupational History  . Occupation: call center  Social Needs  . Financial resource strain: Not on file  . Food insecurity:    Worry: Not on file    Inability: Not on file  . Transportation needs:    Medical: Not on file    Non-medical: Not on file  Tobacco Use  . Smoking status: Never Smoker  . Smokeless tobacco: Never Used  Substance and Sexual Activity  . Alcohol use: Yes    Alcohol/week: 0.0 standard drinks    Comment: once every 2 weeks. Wine coolers  . Drug use: No  . Sexual activity: Not Currently    Birth control/protection: None  Lifestyle  . Physical activity:    Days per week: Not on file    Minutes per session: Not on file  . Stress: Not on file  Relationships  . Social connections:    Talks on phone: Not on file    Gets together: Not on file    Attends religious service: Not on file    Active member of club or organization: Not on file    Attends meetings of clubs or organizations: Not on file    Relationship status: Not on file  Other Topics Concern  . Not on file  Social History Narrative  . Not on file   Additional Social History:   Sleep: Good  Appetite:  Good  Current Medications: Current Facility-Administered Medications  Medication Dose Route Frequency Provider Last Rate Last Dose  . acetaminophen (TYLENOL) tablet 650 mg  650 mg Oral Q4H PRN Suella Broad, FNP   650 mg at 04/15/18 1953  . alum & mag hydroxide-simeth (MAALOX/MYLANTA) 200-200-20 MG/5ML suspension 30 mL  30 mL Oral Q4H PRN Starkes-Perry, Gayland Curry, FNP      . busPIRone (BUSPAR) tablet 15 mg  15 mg Oral TID Sharma Covert, MD   15 mg at 04/17/18 0759  . fluvoxaMINE (LUVOX) tablet 100 mg  100 mg Oral BID ,  A, MD      . hydrOXYzine (ATARAX/VISTARIL) tablet 50  mg  50 mg Oral Q6H PRN Laverle Hobby, PA-C   50 mg at 04/15/18 2113  . lisinopril (PRINIVIL,ZESTRIL) tablet 5 mg  5 mg Oral Daily Sharma Covert, MD   5 mg at 04/17/18 0759  . magnesium hydroxide (MILK OF MAGNESIA) suspension 30 mL  30 mL Oral Daily PRN Suella Broad, FNP      . metFORMIN (GLUCOPHAGE) tablet 1,000 mg  1,000 mg Oral BID WC Suella Broad, FNP   1,000 mg at 04/17/18 0758  . multivitamin with minerals tablet 1 tablet  1 tablet Oral Daily Suella Broad, FNP   1 tablet at 04/17/18 0758  . ondansetron (ZOFRAN) tablet 4 mg  4 mg Oral Q8H PRN Suella Broad, FNP      . pantoprazole (PROTONIX) EC tablet 40 mg  40 mg Oral Daily Suella Broad, FNP   40 mg at 04/17/18 0758  . QUEtiapine (SEROQUEL) tablet 100 mg  100 mg Oral QHS , Myer Peer, MD   100 mg at 04/16/18 2114  . QUEtiapine (SEROQUEL) tablet 25 mg  25 mg Oral BID , Myer Peer, MD   25 mg at 04/17/18 0758  . risperiDONE (RISPERDAL M-TABS) disintegrating tablet 2 mg  2 mg Oral Q8H PRN Suella Broad, FNP   2 mg at 04/15/18 1820   And  . ziprasidone (GEODON) injection 20 mg  20 mg Intramuscular PRN Suella Broad, FNP      . traZODone (DESYREL) tablet 50 mg  50 mg Oral QHS PRN , Myer Peer, MD   50 mg at 04/16/18 2114    Lab Results:  Results for orders placed or performed during the hospital encounter of 04/06/18 (from the past 48 hour(s))  Glucose, capillary     Status: Abnormal   Collection Time: 04/16/18  4:06 PM  Result Value Ref Range   Glucose-Capillary 113 (H) 70 - 99 mg/dL    Blood Alcohol level:  Lab Results  Component Value Date   ETH <10 04/06/2018   ETH <10 83/38/2505    Metabolic Disorder Labs: Lab Results  Component Value Date   HGBA1C 5.6 04/07/2018   MPG 114.02 04/07/2018   MPG 432.59 12/09/2017   Lab Results  Component Value Date   PROLACTIN 16.1 (H) 04/07/2018   Lab Results  Component Value Date   CHOL 172  04/07/2018   TRIG 186 (H) 04/07/2018   HDL 31 (L) 04/07/2018   CHOLHDL 5.5 04/07/2018   VLDL 37 04/07/2018   LDLCALC 104 (H) 04/07/2018   LDLCALC 85 03/05/2017    Physical Findings: AIMS: Facial and Oral Movements Muscles of Facial Expression: None, normal Lips and Perioral Area: None, normal Jaw: None, normal Tongue: None, normal,Extremity Movements Upper (arms, wrists, hands, fingers): None, normal Lower (legs, knees, ankles, toes): None, normal, Trunk Movements Neck, shoulders, hips: None, normal, Overall Severity Severity of abnormal movements (highest score from questions above): None, normal Incapacitation due to abnormal movements: None, normal Patient's awareness of abnormal movements (rate only patient's report): No Awareness, Dental Status Current problems with teeth and/or dentures?: No Does patient usually wear dentures?: No  CIWA:  CIWA-Ar Total: 2 COWS:  COWS Total Score: 0  Musculoskeletal: Strength & Muscle Tone: within normal limits Gait & Station: normal Patient leans: N/A  Psychiatric Specialty Exam: Physical Exam  Nursing note and vitals reviewed. Constitutional: He is oriented to person, place, and time. He appears well-developed and well-nourished.  Cardiovascular: Normal rate.  Respiratory: Effort normal.  Musculoskeletal: Normal range of motion.  Neurological: He is alert and oriented to person, place, and time.  Skin: Skin is warm.    Review of Systems  Constitutional: Negative.   HENT: Negative.   Eyes: Negative.   Respiratory: Negative.   Cardiovascular: Negative.   Gastrointestinal: Negative.   Genitourinary: Negative.   Musculoskeletal: Negative.   Skin: Negative.   Neurological: Negative.   Endo/Heme/Allergies: Negative.   Psychiatric/Behavioral: Positive for depression.  No chest pain, no shortness of breath, no vomiting  Blood pressure (!) 141/91, pulse 84, temperature (!) 97.3 F (36.3 C), temperature source Oral, resp. rate 20,  height 6' (1.829 m), weight 123.4 kg, SpO2 99 %.Body mass index is 36.89 kg/m.  General Appearance: Well Groomed  Eye Contact:  Good  Speech:  Normal Rate  Volume:  Normal  Mood:  partially improved compared to admission. Acknowledges feeling better  Affect:  vaguely anxious, reactive  Thought Process:  Goal Directed and  Descriptions of Associations: Intact  Orientation:  Full (Time, Place, and Person)  Thought Content:  no hallucinations,no delusions   Suicidal Thoughts:  No, today denies suicidal or self injurious ideations and contracts for safety on unit, denies homicidal or violent ideations  Homicidal Thoughts:  No  Memory:  Recent and remote grossly intact  Judgement:  Other:  improving   Insight:  Fair  Psychomotor Activity:  Normal  Concentration:  Concentration: Good and Attention Span: Good  Recall:  Good  Fund of Knowledge:  Good  Language:  Good  Akathisia:  No  Handed:  Right  AIMS (if indicated):     Assets:  Communication Skills Desire for Improvement Financial Resources/Insurance Housing Physical Health Social Support Transportation  ADL's:  Intact  Cognition:  WNL  Sleep:  Number of Hours: 6.25   Assessment -  42, single , no children, lives with roommate, unemployed. History of depression, anxiety. Has been diagnosed with Bipolar Disorder in the past, but describes mostly depression, which he characterizes as chronic. Does endorse some hypomanic type episodes as a teenager and young adult . He describes history of anxiety, and describes  repetitive self injurious ideations which he describes as obsessive. Denies psychosis. History of good response to ECT in the past, but states was unable to continue outpatient ECT following discharge earlier this year.  Patient presents with gradually improving mood and range of affect. Describes decreasing frequency and intensity of suicidal ideations , which he describes as obsessive ideations. He denies medication side  effects- currently on Luvox, Seroquel, Buspar.    Treatment Plan Summary: Daily contact with patient to assess and evaluate symptoms and progress in treatment, Medication management and Plan is to:  Treatment plan reviewed as below today 11/2  Encourage group and milieu participation to work on coping skills and symptom reduction Continue BuSpar 15 mg p.o. 3 times daily for anxiety Increase Luvox to 100 mgrs BID for depression, anxiety Continue Vistaril 50 mg p.o. every 6 hours as needed for anxiety Continue  Seroquel to 25 mg twice daily and 100 mg nightly for mood disorder Continue Trazodone 50  mgQHS PRN for insomnia Continue agitation protocol risperidone M tabs 2 mg every 8 hours as needed and Geodon 20 mg IM injection as needed Treatment team working on disposition planning options Check EKG to monitor QTc   Jenne Campus, MD 04/17/2018, 11:19 AM    Patient ID: Marc Long, male   DOB: Feb 05, 1976, 42 y.o.   MRN: [id number]

## 2018-04-17 NOTE — [facility] Group Notes (Signed)
LCSW Group Therapy Note  04/17/2018   10:30-11:30am   Type of Therapy and Topic:  Group Therapy: Anger, a Secondary Emotion  Participation Level:  Active  Description of Group:   In this group, patients learned how to recognize the underlying emotions when they become angry as well as the frequent behavioral responses they have to anger-provoking situations.  They identified a recent time they became angry and how they reacted.  They analyzed how their reaction was possibly beneficial and how it was possibly unhelpful.  The group discussed a variety of healthier coping skills that could help with such a situation in the future, including taking time to think about the underlying primary emotion and figuring out ways to deal with that before it escalates to anger.  Therapeutic Goals: 1. Patients will remember their last incident of anger and rate it on a scale of 1-10, identifying the severity for them personally. 2. Patients will identify how their behavior at that time worked for them, as well as how it worked against them. 3. Patients will explore possible new behaviors to use in future anger situations. 4. Patients will learn that anger itself is normal and cannot be eliminated, and that healthier reactions can assist with resolving conflict rather than worsening situations.  Summary of Patient Progress:  The patient stated that he was angry yesterday and rated it a 3 out of 10.  He stated that he grew up with the message that anger was not allowed and should not be expressed.  As a result, he turns it inward.  He was insightful and open in his sharing.  Therapeutic Modalities:   Cognitive Behavioral Therapy Discussion  Maretta Los

## 2018-04-18 MED ORDER — QUETIAPINE FUMARATE 50 MG PO TABS
150.0000 mg | ORAL_TABLET | Freq: Every day | ORAL | Status: DC
  Administered 2018-04-18: 150 mg via ORAL
  Filled 2018-04-18 (×3): qty 3

## 2018-04-18 NOTE — Progress Notes (Signed)
D Pt is obeserved OOB UAL on the 400 hall today. HE endorses a flat, depressed affect. HE makes poor eye contact\. He wears hospital-issue patient scrubs. He is pleasant, cooperative and talkative upon meeting this writer this am at med pass.     A HE took his scheduled medications as planned. HE completed his daily assessment and on this he wrote he has experienced SI today, but he contracts with this Probation officer  to not hurt himself. He smiles broadly and shakes his head " yes" when Probation officer explains upcoming [employer] group for 1300 today.     R Safety is in place.

## 2018-04-18 NOTE — Progress Notes (Signed)
[facility] MD Progress Note  04/18/2018 12:34 PM Braiden Rodman  MRN:  [id number]   Subjective: patient reports partially improved mood, and describes gradually decreasing suicidal ideations/less intrusive " images " of suicide. States that yesterday he had " about two or three thoughts which is a lot less than before" and states these are brief thoughts/visualizations of him dying. At this time denies suicidal plan or intention. Describes gradually improving mood. Denies medication side effects at this time. Reports some lingering insomnia .    Objective: I have reviewed chart notes  and have met with patient. 42, single , no children, lives with roommate, unemployed. History of depression, anxiety. Has been diagnosed with Bipolar Disorder in the past, but describes mostly depression, which he characterizes as chronic. Does endorse some hypomanic type episodes as a teenager and young adult . He describes history of anxiety, and describes  repetitive self injurious ideations which he describes as obsessive. Denies psychosis. History of good response to ECT in the past, but states was unable to continue outpatient ECT following discharge earlier this year. Patient is presenting with gradual but noticeable improvement . He presents calm, appropriate, with an overall improving mood and a more reactive affect as well as  decreased suicidal thoughts or images . At this time denies suicidal plans or intentions and contracts for safety. Tolerating medications well thus far. He is visible on unit, going to some groups, pleasant on approach. 11/2 EKG WNL - QTc 406    Principal Problem: Severe bipolar I disorder, most recent episode depressed ([place]) Diagnosis:   Patient Active Problem List   Diagnosis Date Noted  . Schizophrenia ([place]) [F20.9] 04/06/2018  . Severe recurrent major depression without psychotic features ([place]) [F33.2] 12/16/2017  . MDD (major depressive disorder), recurrent episode, severe  ([place]) [F33.2] 12/10/2017  . Obesity (BMI 30-39.9) [E66.9] 12/09/2017  . Severe bipolar I disorder, most recent episode depressed ([place]) [F31.4]   . DKA (diabetic ketoacidoses) ([place]) [E11.10] 12/05/2017  . Hypertriglyceridemia [E78.1] 02/05/2017  . DM (diabetes mellitus), type 2, uncontrolled ([place]) [E11.65] 12/12/2016  . Dehydration [E86.0] 12/12/2016  . Suicidal ideations [R45.851] 12/12/2016  . Bipolar disorder (manic depression) ([place]) [F31.9] 12/12/2016  . Hyperglycemia due to type 2 diabetes mellitus ([place]) [E11.65] 12/12/2016  . AKI (acute kidney injury) ([place]) [N17.9] 12/12/2016  . DKA, type 2, not at goal [facility]) [E11.10] 12/12/2016  . Obesity, Class III, BMI 40-49.9 (morbid obesity) ([place]) [E66.01]   . Type 2 diabetes mellitus without complication, without long-term current use of insulin ([place]) [E11.9] 12/10/2016  . Lithium use [Z79.899] 12/10/2016  . Moderate single current episode of major depressive disorder ([place]) [F32.1] 04/15/2016  . Pain in joint, shoulder region [M25.519] 04/14/2016  . Diverticulosis of large intestine without hemorrhage [K57.30] 11/07/2015  . Calculus of gallbladder without cholecystitis [K80.20] 11/07/2015   Total Time spent with patient: 20 minutes  Past Psychiatric History: See H&P  Past Medical History:  Past Medical History:  Diagnosis Date  . Depression   . Diabetes mellitus without complication ([place])   . Gallstones   . Obesity, Class III, BMI 40-49.9 (morbid obesity) ([place])    History reviewed. No pertinent surgical history. Family History:  Family History  Problem Relation Age of Onset  . Hypertension Mother   . Depression Mother   . Heart attack Maternal Grandfather   . Lymphoma Maternal Grandmother   . Diabetes Maternal Aunt   . Cancer Maternal Aunt    Family Psychiatric  History: See H&P Social History:  Social History   Substance and Sexual Activity  Alcohol Use Yes  . Alcohol/week: 0.0 standard drinks   Comment: once every 2  weeks. Wine coolers     Social History   Substance and Sexual Activity  Drug Use No    Social History   Socioeconomic History  . Marital status: Single    Spouse name: Not on file  . Number of children: 0  . Years of education: Not on file  . Highest education level: Not on file  Occupational History  . Occupation: call center  Social Needs  . Financial resource strain: Not on file  . Food insecurity:    Worry: Not on file    Inability: Not on file  . Transportation needs:    Medical: Not on file    Non-medical: Not on file  Tobacco Use  . Smoking status: Never Smoker  . Smokeless tobacco: Never Used  Substance and Sexual Activity  . Alcohol use: Yes    Alcohol/week: 0.0 standard drinks    Comment: once every 2 weeks. Wine coolers  . Drug use: No  . Sexual activity: Not Currently    Birth control/protection: None  Lifestyle  . Physical activity:    Days per week: Not on file    Minutes per session: Not on file  . Stress: Not on file  Relationships  . Social connections:    Talks on phone: Not on file    Gets together: Not on file    Attends religious service: Not on file    Active member of club or organization: Not on file    Attends meetings of clubs or organizations: Not on file    Relationship status: Not on file  Other Topics Concern  . Not on file  Social History Narrative  . Not on file   Additional Social History:   Sleep: Good  Appetite:  Good  Current Medications: Current Facility-Administered Medications  Medication Dose Route Frequency Provider Last Rate Last Dose  . acetaminophen (TYLENOL) tablet 650 mg  650 mg Oral Q4H PRN Suella Broad, FNP   650 mg at 04/15/18 1953  . alum & mag hydroxide-simeth (MAALOX/MYLANTA) 200-200-20 MG/5ML suspension 30 mL  30 mL Oral Q4H PRN Starkes-Perry, Gayland Curry, FNP      . busPIRone (BUSPAR) tablet 15 mg  15 mg Oral TID Sharma Covert, MD   15 mg at 04/18/18 1205  . fluvoxaMINE (LUVOX) tablet 100  mg  100 mg Oral BID Cobos, Myer Peer, MD   100 mg at 04/18/18 0857  . hydrOXYzine (ATARAX/VISTARIL) tablet 50 mg  50 mg Oral Q6H PRN Laverle Hobby, PA-C   50 mg at 04/17/18 2128  . lisinopril (PRINIVIL,ZESTRIL) tablet 5 mg  5 mg Oral Daily Sharma Covert, MD   5 mg at 04/18/18 0857  . magnesium hydroxide (MILK OF MAGNESIA) suspension 30 mL  30 mL Oral Daily PRN Suella Broad, FNP      . metFORMIN (GLUCOPHAGE) tablet 1,000 mg  1,000 mg Oral BID WC Suella Broad, FNP   1,000 mg at 04/18/18 0856  . multivitamin with minerals tablet 1 tablet  1 tablet Oral Daily Suella Broad, FNP   1 tablet at 04/18/18 0857  . ondansetron (ZOFRAN) tablet 4 mg  4 mg Oral Q8H PRN Suella Broad, FNP      . pantoprazole (PROTONIX) EC tablet 40 mg  40 mg Oral Daily Suella Broad, FNP   40  mg at 04/18/18 0857  . QUEtiapine (SEROQUEL) tablet 150 mg  150 mg Oral QHS ,  A, MD      . risperiDONE (RISPERDAL M-TABS) disintegrating tablet 2 mg  2 mg Oral Q8H PRN Suella Broad, FNP   2 mg at 04/15/18 1820   And  . ziprasidone (GEODON) injection 20 mg  20 mg Intramuscular PRN Suella Broad, FNP      . traZODone (DESYREL) tablet 50 mg  50 mg Oral QHS PRN , Myer Peer, MD   50 mg at 04/17/18 2128    Lab Results:  Results for orders placed or performed during the hospital encounter of 04/06/18 (from the past 48 hour(s))  Glucose, capillary     Status: Abnormal   Collection Time: 04/16/18  4:06 PM  Result Value Ref Range   Glucose-Capillary 113 (H) 70 - 99 mg/dL    Blood Alcohol level:  Lab Results  Component Value Date   ETH <10 04/06/2018   ETH <10 33/00/7622    Metabolic Disorder Labs: Lab Results  Component Value Date   HGBA1C 5.6 04/07/2018   MPG 114.02 04/07/2018   MPG 432.59 12/09/2017   Lab Results  Component Value Date   PROLACTIN 16.1 (H) 04/07/2018   Lab Results  Component Value Date   CHOL 172 04/07/2018   TRIG  186 (H) 04/07/2018   HDL 31 (L) 04/07/2018   CHOLHDL 5.5 04/07/2018   VLDL 37 04/07/2018   LDLCALC 104 (H) 04/07/2018   LDLCALC 85 03/05/2017    Physical Findings: AIMS: Facial and Oral Movements Muscles of Facial Expression: None, normal Lips and Perioral Area: None, normal Jaw: None, normal Tongue: None, normal,Extremity Movements Upper (arms, wrists, hands, fingers): None, normal Lower (legs, knees, ankles, toes): None, normal, Trunk Movements Neck, shoulders, hips: None, normal, Overall Severity Severity of abnormal movements (highest score from questions above): None, normal Incapacitation due to abnormal movements: None, normal Patient's awareness of abnormal movements (rate only patient's report): No Awareness, Dental Status Current problems with teeth and/or dentures?: No Does patient usually wear dentures?: No  CIWA:  CIWA-Ar Total: 2 COWS:  COWS Total Score: 0  Musculoskeletal: Strength & Muscle Tone: within normal limits Gait & Station: normal Patient leans: N/A  Psychiatric Specialty Exam: Physical Exam  Nursing note and vitals reviewed. Constitutional: He is oriented to person, place, and time. He appears well-developed and well-nourished.  Cardiovascular: Normal rate.  Respiratory: Effort normal.  Musculoskeletal: Normal range of motion.  Neurological: He is alert and oriented to person, place, and time.  Skin: Skin is warm.    Review of Systems  Constitutional: Negative.   HENT: Negative.   Eyes: Negative.   Respiratory: Negative.   Cardiovascular: Negative.   Gastrointestinal: Negative.   Genitourinary: Negative.   Musculoskeletal: Negative.   Skin: Negative.   Neurological: Negative.   Endo/Heme/Allergies: Negative.   Psychiatric/Behavioral: Positive for depression.  No chest pain, no shortness of breath, no vomiting  Blood pressure 120/87, pulse 79, temperature 97.7 F (36.5 C), temperature source Oral, resp. rate 16, height 6' (1.829 m),  weight 123.4 kg, SpO2 99 %.Body mass index is 36.89 kg/m.  General Appearance: improving grooming   Eye Contact:  Good  Speech:  Normal Rate  Volume:  Normal  Mood:  gradually improving   Affect:  presents less anxious and with improving range of affect   Thought Process:  Linear and Descriptions of Associations: Intact  Orientation:  Other:  fully alert and attentive  Thought Content:  no hallucinations,no delusions   Suicidal Thoughts:  No, today denies suicidal or self injurious ideations and contracts for safety on unit, denies homicidal or violent ideations  Homicidal Thoughts:  No  Memory:  Recent and remote grossly intact  Judgement:  Other:  improving   Insight:  Fair  Psychomotor Activity:  Normal  Concentration:  Concentration: Good and Attention Span: Good  Recall:  Good  Fund of Knowledge:  Good  Language:  Good  Akathisia:  No  Handed:  Right  AIMS (if indicated):     Assets:  Communication Skills Desire for Improvement Financial Resources/Insurance Housing Physical Health Social Support Transportation  ADL's:  Intact  Cognition:  WNL  Sleep:  Number of Hours: 7.75   Assessment -  42, single , no children, lives with roommate, unemployed. History of depression, anxiety. Has been diagnosed with Bipolar Disorder in the past, but describes mostly depression, which he characterizes as chronic. Does endorse some hypomanic type episodes as a teenager and young adult . He describes history of anxiety, and describes  repetitive self injurious ideations which he describes as obsessive. Denies psychosis. History of good response to ECT in the past, but states was unable to continue outpatient ECT following discharge earlier this year.  Patient is presenting with partially improved mood and a more reactive, fuller range of affect . He presents less anxious. Reports chronic suicidal ruminations, described as obsessive, repetitive thoughts , sometimes thoughts described as   visual images of self harm . These are chronic but have subsided significantly since admission and denies any suicidal plan or intention currently . Becoming more future oriented . Tolerating medications well. Describes some lingering insomnia .   Treatment Plan Summary: Daily contact with patient to assess and evaluate symptoms and progress in treatment, Medication management and Plan is to:  Treatment plan reviewed as below today 11/3  Encourage group and milieu participation to work on coping skills and symptom reduction Continue BuSpar 15 mg p.o. 3 times daily for anxiety Continue  Luvox 100 mgrs BID for depression, anxiety Continue Vistaril 50 mg p.o. every 6 hours as needed for anxiety Change Seroquel to 150 mgrs QHS for mood disorder and to help with insomnia  Continue Trazodone 50  mgQHS PRN for insomnia Continue agitation protocol risperidone M tabs 2 mg every 8 hours as needed and Geodon 20 mg IM injection as needed Treatment team working on disposition planning options   Jenne Campus, MD 04/18/2018, 12:34 PM    Patient ID: [name], male   DOB: 1976/06/08, 42 y.o.   MRN: [id number]

## 2018-04-18 NOTE — [facility] Group Notes (Signed)
Pt attended and participated in group this evening.   

## 2018-04-18 NOTE — Plan of Care (Signed)
  Problem: Education: Goal: Emotional status will improve Outcome: Progressing   

## 2018-04-18 NOTE — [facility] Group Notes (Signed)
[place] LCSW Group Therapy Note  04/18/2018  10:00-11:00AM  Type of Therapy and Topic:  Group Therapy:  Adding Supports Including Being Your Own Support  Participation Level:  Active   Description of Group:  Patients in this group were introduced to the concept that additional supports including self-support are an essential part of recovery.  A song entitled "I Need Help!" was played and a group discussion was held in reaction to the idea of needing to add supports.  A song entitled "My Own Hero" was played and a group discussion ensued in which patients stated they could relate to the song and it inspired them to realize they have be willing to help themselves in order to succeed, because other people cannot achieve sobriety or stability for them.  We discussed adding a variety of healthy supports to address the various needs in their lives.  A song was played called "I Know Where I've Been" toward the end of group and used to conduct an inspirational wrap-up to group of remembering how far they have already come in their journey.  Therapeutic Goals: 1)  demonstrate the importance of being a part of one's own support system 2)  discuss reasons people in one's life may eventually be unable to be continually supportive  3)  identify the patient's current support system and   4)  elicit commitments to add healthy supports and to become more conscious of being self-supportive   Summary of Patient Progress:  The patient expressed himself thoroughly and with increasing insight throughout group   Therapeutic Modalities:   Motivational Interviewing Activity  Maretta Los

## 2018-04-18 NOTE — [facility] Group Notes (Signed)
Adult Nursing Psychoeducational Group Note  Date:  04/18/2018  Time: 2:30 pm  Group Topic/Focus: Life Skills The purpose of this group is to help patients identify strategies for coping with mental health crisis.  Group discusses possible causes of crisis and ways to manage them effectively. Facilitated By: Patty D. RN  Participation Level:  Active  Participation Quality:  Appropriate and Attentive  Affect:  Appropriate  Cognitive:  Alert and Oriented  Insight: Improving  Engagement in Group:  Developing/Improving  Modes of Intervention:  Discussion and Education  Additional Comments:  Patient attend group for it's entirety.   Marc Long A Marc Long 04/18/2018 2:30 pm  

## 2018-04-19 MED ORDER — PANTOPRAZOLE SODIUM 40 MG PO TBEC: 40.0000 mg | DELAYED_RELEASE_TABLET | Freq: Every day | ORAL | 0 refills | Status: DC

## 2018-04-19 MED ORDER — FLUVOXAMINE MALEATE 100 MG PO TABS: 100.0000 mg | ORAL_TABLET | Freq: Two times a day (BID) | ORAL | 0 refills | Status: DC

## 2018-04-19 MED ORDER — BUSPIRONE HCL 15 MG PO TABS: 15.0000 mg | ORAL_TABLET | Freq: Three times a day (TID) | ORAL | 0 refills | Status: DC

## 2018-04-19 MED ORDER — LISINOPRIL 5 MG PO TABS: 5.0000 mg | ORAL_TABLET | Freq: Every day | ORAL | 0 refills | Status: DC

## 2018-04-19 MED ORDER — QUETIAPINE FUMARATE 50 MG PO TABS: 150.0000 mg | ORAL_TABLET | Freq: Every day | ORAL | 0 refills | Status: DC

## 2018-04-19 MED ORDER — HYDROXYZINE HCL 50 MG PO TABS: 50.0000 mg | ORAL_TABLET | Freq: Four times a day (QID) | ORAL | 0 refills | Status: DC | PRN

## 2018-04-19 MED ORDER — METFORMIN HCL 1000 MG PO TABS: 1000.0000 mg | ORAL_TABLET | Freq: Two times a day (BID) | ORAL | 0 refills | Status: DC

## 2018-04-19 MED ORDER — TRAZODONE HCL 50 MG PO TABS: 50.0000 mg | ORAL_TABLET | Freq: Every evening | ORAL | 0 refills | Status: DC | PRN

## 2018-04-19 NOTE — Discharge Summary (Addendum)
Physician Discharge Summary Note  Patient:  Marc Long is an 42 y.o., male  MRN:  [id number]  DOB:  [birth year]  Patient phone:  [phone] (home)   Patient address:   [place] [address],   Total Time spent with patient: Greater than 30 minutes  Date of Admission:  04/06/2018  Date of Discharge: 04/19/18  Reason for Admission: Suicide attempt by overdose.  Principal Problem: Severe bipolar I disorder, most recent episode depressed [facility])  Discharge Diagnoses: Patient Active Problem List   Diagnosis Date Noted  . Schizophrenia ([place]) [F20.9] 04/06/2018  . Severe recurrent major depression without psychotic features ([place]) [F33.2] 12/16/2017  . MDD (major depressive disorder), recurrent episode, severe ([place]) [F33.2] 12/10/2017  . Obesity (BMI 30-39.9) [E66.9] 12/09/2017  . Severe bipolar I disorder, most recent episode depressed ([place]) [F31.4]   . DKA (diabetic ketoacidoses) ([place]) [E11.10] 12/05/2017  . Hypertriglyceridemia [E78.1] 02/05/2017  . DM (diabetes mellitus), type 2, uncontrolled ([place]) [E11.65] 12/12/2016  . Dehydration [E86.0] 12/12/2016  . Suicidal ideations [R45.851] 12/12/2016  . Bipolar disorder (manic depression) ([place]) [F31.9] 12/12/2016  . Hyperglycemia due to type 2 diabetes mellitus ([place]) [E11.65] 12/12/2016  . AKI (acute kidney injury) ([place]) [N17.9] 12/12/2016  . DKA, type 2, not at goal [facility]) [E11.10] 12/12/2016  . Obesity, Class III, BMI 40-49.9 (morbid obesity) ([place]) [E66.01]   . Type 2 diabetes mellitus without complication, without long-term current use of insulin ([place]) [E11.9] 12/10/2016  . Lithium use [Z79.899] 12/10/2016  . Moderate single current episode of major depressive disorder ([place]) [F32.1] 04/15/2016  . Pain in joint, shoulder region [M25.519] 04/14/2016  . Diverticulosis of large intestine without hemorrhage [K57.30] 11/07/2015  . Calculus of gallbladder without cholecystitis [K80.20] 11/07/2015   Past  Psychiatric History: Hx. Schizophrenia  Past Medical History:  Past Medical History:  Diagnosis Date  . Depression   . Diabetes mellitus without complication ([place])   . Gallstones   . Obesity, Class III, BMI 40-49.9 (morbid obesity) ([place])    History reviewed. No pertinent surgical history.  Family History:  Family History  Problem Relation Age of Onset  . Hypertension Mother   . Depression Mother   . Heart attack Maternal Grandfather   . Lymphoma Maternal Grandmother   . Diabetes Maternal Aunt   . Cancer Maternal Aunt    Family Psychiatric  History: See H&P  Social History:  Social History   Substance and Sexual Activity  Alcohol Use Yes  . Alcohol/week: 0.0 standard drinks   Comment: once every 2 weeks. Wine coolers     Social History   Substance and Sexual Activity  Drug Use No    Social History   Socioeconomic History  . Marital status: Single    Spouse name: Not on file  . Number of children: 0  . Years of education: Not on file  . Highest education level: Not on file  Occupational History  . Occupation: call center  Social Needs  . Financial resource strain: Not on file  . Food insecurity:    Worry: Not on file    Inability: Not on file  . Transportation needs:    Medical: Not on file    Non-medical: Not on file  Tobacco Use  . Smoking status: Never Smoker  . Smokeless tobacco: Never Used  Substance and Sexual Activity  . Alcohol use: Yes    Alcohol/week: 0.0 standard drinks    Comment: once every 2 weeks. Wine coolers  . Drug use:  No  . Sexual activity: Not Currently    Birth control/protection: None  Lifestyle  . Physical activity:    Days per week: Not on file    Minutes per session: Not on file  . Stress: Not on file  Relationships  . Social connections:    Talks on phone: Not on file    Gets together: Not on file    Attends religious service: Not on file    Active member of club or organization: Not on file    Attends meetings of  clubs or organizations: Not on file    Relationship status: Not on file  Other Topics Concern  . Not on file  Social History Narrative  . Not on file   Hospital Course: (Per Md's admission evaluation): Patient is a 42 year old male with a past psychiatric history significant for major depression versus bipolar disorder; depressed. Patient presented to the [facility] emergency department voluntarily by way of his mother secondary to a reported overdose. The patient stated he had ingested 21 Vistaril, 6 Seroquel, 3 trazodone, and 1 melatonin at approximately 11 PM. Patient stated that after taking the pills he spoke to his mother, and admitted that he did it so she could bring him to the hospital. He made himself throw up prior to coming to the hospital. He stated that he was significantly depressed. He admitted that he had previously attempted to harm himself in the past. He denied any auditory, visual or tactile hallucinations. No homicidal ideation. He is followed by [facility] and has a therapist. He was last hospitalized at [facility] on 12/10/2017. He was hospitalized at that time because of suicidal ideation depression. He was transferred to the [facility] for ECT. He received 4 treatments at that time. He was then discharged for outpatient treatment. He stated that he is still currently depressed. He stated that he went to [facility] earlier this week and they increased his fluoxetine to 60 mg p.o. daily. He stated he was unable to continue outpatient ECT because of financial reasons. He stated that he continues to have intrusive thoughts which makes him want to harm himself. He also stated that these intrusive thoughts that started when he was a teenager when he felt as though he had to control thoughts and not to "rape people". His intrusive thoughts now are to injure himself. He stated that occasionally he will look for a pen or pencil to stab to his hand. He  sometimes goes in the bathroom and hits his hand violently to control these urges. He denied any other compulsive or obsessive behaviors. He was admitted to the hospital for evaluation and stabilization.  Seven was recently a patient here at [facility] for treatment for recurrent chronic depression. At the time, he continues to report worsening depression on daily basis. It appeared then that his treatment regimen was the helping his symptoms. He was evaluated to ECT, came to find out that he was a candidate. [place] was also happy & agreed to try the ECT as a treatment option to get his depression under control. He was discharged to the [place] center for the ECT. After completing 4 treatments, he was discharged. However, Donnis was re-admitted to the [facility] for mood stabilization treatment after an apparent overdose in a suicide attempt.   During this present hospital stay, Marsean received & was discharged on; Buspar 15 mg for anxiety, Luvox 100 mg for depression/obsessive thoughts, Vistaril 50 mg prn for anxiety, Seroquel 150  mg for mood control & Trazodone 50 mg prn for insomnia. He was enrolled & participated in the group counseling sessions being offered & held on this unit. He learned coping skills. He presented other significant pre-existing medical issues that required treatment & or monitoring. He tolerated his treatment regimen with any adverse effects or reactions reported. Jory's symptoms responded well to his treatment regimen. He is currently mentally & medically stable for discharge. He will continue mental health care on an outpatient basis as noted below.  Upon discharge, patient presents alert, attentive, calm, pleasant on approach, mood described as improved, affect reactive, no thought disorder, no hallucinations, no delusions.  Today he denies suicidal ideations, plans or intentions, and states that frequency of suicidal imagery has decreased significantly. At this time he is future  oriented - applying for disability and financial assistance, considering taking a course to become a peer counselor, and looking forward to going to see a movie later this week . Currently, there are no medication side effects reported. We reviewed mediation side effects, including risk of movement disorders, weight gain, sedation, metabolic side effects of Seroquel. Of note, patient states he has not gained weight on Seroquel and states he has lost about 20 lbs over recent weeks. No disruptive or agitated behaviors on unit. Reports he can utilize his brother, mother, for support. He left [facility] in no apparent distress.  Physical Findings: AIMS: Facial and Oral Movements Muscles of Facial Expression: None, normal Lips and Perioral Area: None, normal Jaw: None, normal Tongue: None, normal,Extremity Movements Upper (arms, wrists, hands, fingers): None, normal Lower (legs, knees, ankles, toes): None, normal, Trunk Movements Neck, shoulders, hips: None, normal, Overall Severity Severity of abnormal movements (highest score from questions above): None, normal Incapacitation due to abnormal movements: None, normal Patient's awareness of abnormal movements (rate only patient's report): No Awareness, Dental Status Current problems with teeth and/or dentures?: No Does patient usually wear dentures?: No  CIWA:  CIWA-Ar Total: 2 COWS:  COWS Total Score: 0  Musculoskeletal: Strength & Muscle Tone: within normal limits Gait & Station: normal Patient leans: N/A  Psychiatric Specialty Exam: Physical Exam  Nursing note and vitals reviewed. Constitutional: He appears well-developed.  HENT:  Head: Normocephalic.  Eyes: Pupils are equal, round, and reactive to light.  Neck: Normal range of motion.  Cardiovascular: Normal rate.  Respiratory: Effort normal.  GI: Soft.  Genitourinary:  Genitourinary Comments: Deferred  Musculoskeletal: Normal range of motion.  Neurological: He is alert.  Skin: Skin is  warm.    Review of Systems  Constitutional: Negative.   HENT: Negative.   Eyes: Negative.   Respiratory: Negative.  Negative for cough and shortness of breath.   Cardiovascular: Negative.  Negative for chest pain and palpitations.  Gastrointestinal: Negative.  Negative for abdominal pain, heartburn, nausea and vomiting.  Genitourinary: Negative.   Musculoskeletal: Negative.   Skin: Negative.   Neurological: Negative.   Endo/Heme/Allergies: Negative.   Psychiatric/Behavioral: Positive for depression (Stable). Negative for hallucinations, memory loss, substance abuse and suicidal ideas. The patient has insomnia (Stable).     Blood pressure (!) 122/92, pulse 79, temperature 97.7 F (36.5 C), temperature source Oral, resp. rate 16, height 6' (1.829 m), weight 123.4 kg, SpO2 99 %.Body mass index is 36.89 kg/m.  See Md's discharge SRA   Have you used any form of tobacco in the last 30 days? (Cigarettes, Smokeless Tobacco, Cigars, and/or Pipes): No  Has this patient used any form of tobacco in the last 30 days? (  Cigarettes, Smokeless Tobacco, Cigars, and/or Pipes): No  Blood Alcohol level:  Lab Results  Component Value Date   ETH <10 04/06/2018   ETH <10 16/03/9603   Metabolic Disorder Labs:  Lab Results  Component Value Date   HGBA1C 5.6 04/07/2018   MPG 114.02 04/07/2018   MPG 432.59 12/09/2017   Lab Results  Component Value Date   PROLACTIN 16.1 (H) 04/07/2018   Lab Results  Component Value Date   CHOL 172 04/07/2018   TRIG 186 (H) 04/07/2018   HDL 31 (L) 04/07/2018   CHOLHDL 5.5 04/07/2018   VLDL 37 04/07/2018   LDLCALC 104 (H) 04/07/2018   LDLCALC 85 03/05/2017   See Psychiatric Specialty Exam and Suicide Risk Assessment completed by Attending Physician prior to discharge.  Discharge destination:  Home  Is patient on multiple antipsychotic therapies at discharge:  No   Has Patient had three or more failed trials of antipsychotic monotherapy by history:   No  Recommended Plan for Multiple Antipsychotic Therapies: NA  Allergies as of 04/19/2018      Reactions   Bee Venom Swelling   Keflex [cephalexin]    UPSET STOMACH   Relafen [nabumetone]    Blood in stool   Shellfish Allergy Nausea Only      Medication List    STOP taking these medications   FLUoxetine 40 MG capsule Commonly known as:  PROZAC   multivitamin with minerals Tabs tablet   polyethylene glycol packet Commonly known as:  MIRALAX / GLYCOLAX     TAKE these medications     Indication  busPIRone 15 MG tablet Commonly known as:  BUSPAR Take 1 tablet (15 mg total) by mouth 3 (three) times daily. For anxiety What changed:    medication strength  how much to take  additional instructions  Indication:  Anxiety Disorder   fluvoxaMINE 100 MG tablet Commonly known as:  LUVOX Take 1 tablet (100 mg total) by mouth 2 (two) times daily. For depression  Indication:  Major Depressive Disorder   hydrOXYzine 50 MG tablet Commonly known as:  ATARAX/VISTARIL Take 1 tablet (50 mg total) by mouth every 6 (six) hours as needed for anxiety. What changed:  when to take this  Indication:  Feeling Anxious   lisinopril 5 MG tablet Commonly known as:  PRINIVIL,ZESTRIL Take 1 tablet (5 mg total) by mouth daily. For high blood pressure Start taking on:  04/20/2018  Indication:  High Blood Pressure Disorder   metFORMIN 1000 MG tablet Commonly known as:  GLUCOPHAGE Take 1 tablet (1,000 mg total) by mouth 2 (two) times daily with a meal. For diabetes management What changed:  additional instructions  Indication:  Type 2 Diabetes   pantoprazole 40 MG tablet Commonly known as:  PROTONIX Take 1 tablet (40 mg total) by mouth daily. For acid reflux What changed:  additional instructions  Indication:  Stomach Ulcer, Gastroesophageal Reflux Disease   QUEtiapine 50 MG tablet Commonly known as:  SEROQUEL Take 3 tablets (150 mg total) by mouth at bedtime. For mood control What  changed:    medication strength  how much to take  additional instructions  Indication:  Mood control   traZODone 50 MG tablet Commonly known as:  DESYREL Take 1 tablet (50 mg total) by mouth at bedtime as needed for sleep. What changed:    medication strength  how much to take  Indication:  Trouble Sleeping      Follow-up Information    [facility]. Go on 04/19/2018.  Why:  Appointment for medication management services is Monday, 04/19/18 at 9:00am with Tim. Please be sure to bring any discharge paperwork from this hospitalization.  Contact information: [address] [place] [address] [phone]          Follow-up recommendations: Activity:  As tolerated Diet: As recommended by your primary care doctor. Keep all scheduled follow-up appointments as recommended.    Comments:  Patient is instructed prior to discharge to: Take all medications as prescribed by his/her mental healthcare provider. Report any adverse effects and or reactions from the medicines to his/her outpatient provider promptly. Patient has been instructed & cautioned: To not engage in alcohol and or illegal drug use while on prescription medicines. In the event of worsening symptoms, patient is instructed to call the crisis hotline, 911 and or go to the nearest ED for appropriate evaluation and treatment of symptoms. To follow-up with his/her primary care provider for your other medical issues, concerns and or health care needs.   Signed: Lindell Spar, NP, PMHNP, FNP-BC 04/19/2018, 11:09 AM Patient seen, Suicide Assessment Completed.  Disposition Plan Reviewed

## 2018-04-19 NOTE — [facility] Suicide Risk Assessment (Addendum)
[facility] Discharge Suicide Risk Assessment   Principal Problem: Severe bipolar I disorder, most recent episode depressed [facility]) Discharge Diagnoses:  Patient Active Problem List   Diagnosis Date Noted  . Schizophrenia ([place]) [F20.9] 04/06/2018  . Severe recurrent major depression without psychotic features ([place]) [F33.2] 12/16/2017  . MDD (major depressive disorder), recurrent episode, severe ([place]) [F33.2] 12/10/2017  . Obesity (BMI 30-39.9) [E66.9] 12/09/2017  . Severe bipolar I disorder, most recent episode depressed ([place]) [F31.4]   . DKA (diabetic ketoacidoses) ([place]) [E11.10] 12/05/2017  . Hypertriglyceridemia [E78.1] 02/05/2017  . DM (diabetes mellitus), type 2, uncontrolled ([place]) [E11.65] 12/12/2016  . Dehydration [E86.0] 12/12/2016  . Suicidal ideations [R45.851] 12/12/2016  . Bipolar disorder (manic depression) ([place]) [F31.9] 12/12/2016  . Hyperglycemia due to type 2 diabetes mellitus ([place]) [E11.65] 12/12/2016  . AKI (acute kidney injury) ([place]) [N17.9] 12/12/2016  . DKA, type 2, not at goal [facility]) [E11.10] 12/12/2016  . Obesity, Class III, BMI 40-49.9 (morbid obesity) ([place]) [E66.01]   . Type 2 diabetes mellitus without complication, without long-term current use of insulin ([place]) [E11.9] 12/10/2016  . Lithium use [Z79.899] 12/10/2016  . Moderate single current episode of major depressive disorder ([place]) [F32.1] 04/15/2016  . Pain in joint, shoulder region [M25.519] 04/14/2016  . Diverticulosis of large intestine without hemorrhage [K57.30] 11/07/2015  . Calculus of gallbladder without cholecystitis [K80.20] 11/07/2015    Total Time spent with patient: 30 minutes  Musculoskeletal: Strength & Muscle Tone: within normal limits Gait & Station: normal Patient leans: N/A  Psychiatric Specialty Exam: ROS denies headache, no chest pain, no shortness of breath, no nausea, no vomiting   Blood pressure (!) 122/92, pulse 79, temperature 97.7 F (36.5 C), temperature source Oral, resp. rate 16,  height 6' (1.829 m), weight 123.4 kg, SpO2 99 %.Body mass index is 36.89 kg/m.  General Appearance: Well Groomed  Engineer, water::  Good  Speech:  Normal Rate409  Volume:  Normal  Mood:  reports " I am actually feeling a lot better today", describes mood as 8/10  Affect:  more reactive  Thought Process:  Linear and Descriptions of Associations: Intact  Orientation:  Other:  fully alert and attentive  Thought Content:  no hallucinations, no delusions   Suicidal Thoughts:  No denies suicidal plan or intention, describes chronic/repetitive suicidal thoughts-states that frequency of suicidal thoughts has decreased significantly, and denies any plan or intention or any SI today. Presents future oriented   Homicidal Thoughts:  No denies any  homicidal or violent ideations  Memory:  recent and remote grossly intact   Judgement:  Other:  improving   Insight:  Fair and improving   Psychomotor Activity:  Normal  Concentration:  Good  Recall:  Good  Fund of Knowledge:Good  Language: Good  Akathisia:  Negative  Handed:  Right  AIMS (if indicated):   no abnormal or involuntary movements noted   Assets:  Communication Skills Desire for Improvement Resilience  Sleep:  Number of Hours: 5.75  Cognition: WNL  ADL's:  Intact   Mental Status Per Nursing Assessment::   On Admission:  Suicidal ideation indicated by patient, Self-harm thoughts, Plan includes specific time, place, or method  Demographic Factors:  53, single, no children, lives with roommate,   Loss Factors: Unemployment , inability to have outpatient ECT due to financial, support constraints   Historical Factors: History of mood disorder- depression. History of prior Bipolar Disorder diagnosis. History of chronic suicidal ideations, which he reports as visual images thoughts of dying, history of prior  suicidal attempt by not taking diabetic medication.    Risk Reduction Factors:   Living with another person, especially a relative  and Positive coping skills or problem solving skills  Continued Clinical Symptoms:  Alert, attentive, calm, pleasant on approach, mood described as improved , affect reactive, no thought disorder, no hallucinations,no delusions, today denies suicidal ideations, plans or intentions, and states that frequency of suicidal imagery has decreased significantly. At this time he is future oriented - applying for disability and financial assistance , considering taking a course to become a peer counselor, and looking forward to going see a movie later this week . Currently no medication side effects. We reviewed mediation side effects, including risk of movement disorders, weight gain, sedation, metabolic side effects of Seroquel. Of note, patient states he has not gained weight on Seroquel, and states he has lost about 20 lbs over recent weeks.  No disruptive or agitated behaviors on unit. Reports he can utilize his brother, mother, for support.     Cognitive Features That Contribute To Risk:  No gross cognitive deficits noted upon discharge. Is alert , attentive, and oriented x 3    Suicide Risk:  Mild:  Suicidal ideation of limited frequency, intensity, duration, and specificity.  There are no identifiable plans, no associated intent, mild dysphoria and related symptoms, good self-control (both objective and subjective assessment), few other risk factors, and identifiable protective factors, including available and accessible social support.  Follow-up Information    [facility]. Go on 04/19/2018.   Why:  Appointment for medication management services is Monday, 04/19/18 at 9:00am with Tim. Please be sure to bring any discharge paperwork from this hospitalization.  Contact information: [address] [place] [address] [phone]           Plan Of Care/Follow-up recommendations:  Activity:  as tolerated  Diet:  heart healthy, diabetic diet  Tests:  NA Other:  See below Patient expressing  improvement and readiness for discharge today Plans to return home Plans to follow up as above, and plans to follow up with his PCP, Dr. Lavonia Drafts, for medical managment  Jenne Campus, MD 04/19/2018, 8:14 AM

## 2018-04-19 NOTE — Progress Notes (Signed)
  [facility] Adult Case Management Discharge Plan :  Will you be returning to the same living situation after discharge:  Yes,  patient reports he is discharging home to his apartment At discharge, do you have transportation home?: Yes,  patient reports he will be picked up by his mother or roommate at discharge Do you have the ability to pay for your medications: No.  Release of information consent forms completed and in the chart;  Patient's signature needed at discharge.  Patient to Follow up at: Follow-up Information    [facility]. Go on 04/19/2018.   Why:  Appointment for medication management services is Monday, 04/19/18 at 9:00am with Tim. Please be sure to bring any discharge paperwork from this hospitalization.  Contact information: [address] [place] [address] [phone]           Next level of care provider has access to [place] and Suicide Prevention discussed: Yes,  with the patient's mother  Have you used any form of tobacco in the last 30 days? (Cigarettes, Smokeless Tobacco, Cigars, and/or Pipes): No  Has patient been referred to the Quitline?: N/A patient is not a smoker  Patient has been referred for addiction treatment: [place], [place] 04/19/2018, 9:33 AM

## 2018-04-19 NOTE — Progress Notes (Signed)
Recreation Therapy Notes  Date: 11.4.19 Time: 0930 Location: [address] Day[address]  Group Topic: Stress Management  Goal Area(s) Addresses:  Patient will verbalize importance of using healthy stress management.  Patient will identify positive emotions associated with healthy stress management.   Intervention: Stress Management  Activity :  Meditation.  LRT introduced the stress management technique of meditation.  LRT played a meditation dealing with resilience.  Patients were to listen and follow along with the meditation.  Education:  Stress Management, Discharge Planning.   Education Outcome: Acknowledges edcuation/In group clarification offered/Needs additional education  Clinical Observations/Feedback: Pt did not attend group.     Victorino Sparrow, LRT/CTRS         Ria Comment, Carrine Kroboth A 04/19/2018 11:07 AM

## 2018-04-19 NOTE — Progress Notes (Signed)
D: When asked about his day pt stated, "today was not as good as yesterday. Less visions today, maybe about 2". Writer asked what the visions were about. Pt stated, "Suicidal visualizations stabbing myself in the neck with a knife". Informed the writer that he "needs to be discharged tomorrow", because he has an appt at 1300 at [facility]. Pt has no other questions or concerns.   A:  Support and encouragement was offered. 15 min checks continued for safety.  R: Pt remains safe.

## 2018-04-19 NOTE — Plan of Care (Signed)
Discharge Note  Patient verbalizes readiness for discharge. Follow up plan explained, AVS, Transition record and SRA given. Prescriptions and teaching provided. Belongings returned and signed for. Suicide safety plan completed and signed. Patient verbalizes understanding. Patient denies SI/HI and assures this Probation officer he will seek assistance should that change. Patient discharged to lobby where ride was waiting.  Problem: Education: Goal: Knowledge of [facility] Education information/materials will improve Outcome: Adequate for Discharge Goal: Emotional status will improve Outcome: Adequate for Discharge Goal: Mental status will improve Outcome: Adequate for Discharge Goal: Verbalization of understanding the information provided will improve Outcome: Adequate for Discharge   Problem: Activity: Goal: Interest or engagement in activities will improve Outcome: Adequate for Discharge Goal: Sleeping patterns will improve Outcome: Adequate for Discharge   Problem: Coping: Goal: Ability to verbalize frustrations and anger appropriately will improve Outcome: Adequate for Discharge Goal: Ability to demonstrate self-control will improve Outcome: Adequate for Discharge   Problem: Health Behavior/Discharge Planning: Goal: Identification of resources available to assist in meeting health care needs will improve Outcome: Adequate for Discharge Goal: Compliance with treatment plan for underlying cause of condition will improve Outcome: Adequate for Discharge   Problem: Physical Regulation: Goal: Ability to maintain clinical measurements within normal limits will improve Outcome: Adequate for Discharge   Problem: Safety: Goal: Periods of time without injury will increase Outcome: Adequate for Discharge   Problem: Education: Goal: Utilization of techniques to improve thought processes will improve Outcome: Adequate for Discharge Goal: Knowledge of the prescribed therapeutic regimen  will improve Outcome: Adequate for Discharge   Problem: Activity: Goal: Interest or engagement in leisure activities will improve Outcome: Adequate for Discharge Goal: Imbalance in normal sleep/wake cycle will improve Outcome: Adequate for Discharge   Problem: Coping: Goal: Coping ability will improve Outcome: Adequate for Discharge Goal: Will verbalize feelings Outcome: Adequate for Discharge   Problem: Health Behavior/Discharge Planning: Goal: Ability to make decisions will improve Outcome: Adequate for Discharge Goal: Compliance with therapeutic regimen will improve Outcome: Adequate for Discharge   Problem: Role Relationship: Goal: Will demonstrate positive changes in social behaviors and relationships Outcome: Adequate for Discharge   Problem: Safety: Goal: Ability to disclose and discuss suicidal ideas will improve Outcome: Adequate for Discharge Goal: Ability to identify and utilize support systems that promote safety will improve Outcome: Adequate for Discharge   Problem: Self-Concept: Goal: Will verbalize positive feelings about self Outcome: Adequate for Discharge Goal: Level of anxiety will decrease Outcome: Adequate for Discharge   Problem: Education: Goal: Ability to make informed decisions regarding treatment will improve Outcome: Adequate for Discharge   Problem: Coping: Goal: Coping ability will improve Outcome: Adequate for Discharge   Problem: Health Behavior/Discharge Planning: Goal: Identification of resources available to assist in meeting health care needs will improve Outcome: Adequate for Discharge   Problem: Medication: Goal: Compliance with prescribed medication regimen will improve Outcome: Adequate for Discharge   Problem: Self-Concept: Goal: Ability to disclose and discuss suicidal ideas will improve Outcome: Adequate for Discharge Goal: Will verbalize positive feelings about self Outcome: Adequate for Discharge

## 2018-07-18 ENCOUNTER — Encounter (HOSPITAL_COMMUNITY): Payer: Self-pay

## 2018-07-18 ENCOUNTER — Ambulatory Visit (HOSPITAL_COMMUNITY)
Admission: EM | Admit: 2018-07-18 | Discharge: 2018-07-18 | Disposition: A | Discharge status: Home / Self Care | Payer: Medicaid Other | Attending: Emergency Medicine | Admitting: Emergency Medicine

## 2018-07-18 ENCOUNTER — Other Ambulatory Visit: Payer: Self-pay

## 2018-07-18 DIAGNOSIS — R197 Diarrhea, unspecified: Secondary | ICD-10-CM | POA: Diagnosis not present

## 2018-07-18 DIAGNOSIS — R112 Nausea with vomiting, unspecified: Secondary | ICD-10-CM

## 2018-07-18 LAB — GLUCOSE, CAPILLARY: Glucose-Capillary: 93 mg/dL (ref 70–99)

## 2018-07-18 MED ORDER — ONDANSETRON 4 MG PO TBDP
ORAL_TABLET | ORAL | Status: AC
  Filled 2018-07-18: qty 2

## 2018-07-18 MED ORDER — ONDANSETRON 4 MG PO TBDP
8.0000 mg | ORAL_TABLET | Freq: Once | ORAL | Status: AC
  Administered 2018-07-18: 8 mg via ORAL

## 2018-07-18 MED ORDER — ONDANSETRON 8 MG PO TBDP: ORAL_TABLET | ORAL | 0 refills | Status: DC

## 2018-07-18 NOTE — Discharge Instructions (Addendum)
Try the Zofran.  Keep a diary of your activities and your food and beverage intake to see if you can identify a pattern that precipitates your symptoms.

## 2018-07-18 NOTE — ED Provider Notes (Signed)
HPI  SUBJECTIVE:  Marc Long is a 43 y.o. male who presents with recurrent episodes of nausea, vomiting, and diarrhea occurring during the weekends over the past month.  He states that he recovers completely every week.  He states that the current episode started today at noon.  He reports nausea, 4 episodes of nonbilious, nonbloody emesis, 3-4 episodes of watery, nonbloody diarrhea.  He reports bilateral nonmigratory, nonradiating dull, intermittent abdominal pain that lasts minutes to up to an hour.  No fevers, chest pain, shortness of breath.  No abdominal distention.  No change in urine output.  He is tolerating liquids.  Reports some anorexia.  No back pain.  No penile or testicular pain, swelling, rash, penile discharge.  No urinary urgency, frequency, cloudy, odorous urine, hematuria.  States that he ate some spaghetti straight out of the can last night, but denies any other raw or undercooked foods, questionable leftovers, recent travel, contacts with similar symptoms.  No recent antibiotics.  No change in his medications.  He denies alcohol or marijuana use.  States that his fingerstick was 118 today.  He has tried pushing fluids and Imodium with some improvement in his symptoms.  There are no aggravating factors.  Pain is not associated with drinking, urination, defecation, movement.  Past medical history of hypertension, diabetes, DKA twice last year, cholelithiasis, diverticulosis, diverticulosis, acute kidney injury, schizophrenia, bipolar.  No history of abdominal surgeries, gastroparesis, atrial fibrillation, hypercoagulability, mesenteric ischemia, MI, IBS, hypercholesterolemia, smoking, UTI, pyelonephritis, nephrolithiasis.  PMD: Vassie Moment, MD States that he has an appointment arranged with her on Tuesday.    Past Medical History:  Diagnosis Date  . Depression   . Diabetes mellitus without complication ([place])   . Gallstones   . Obesity, Class III, BMI 40-49.9 (morbid  obesity) ([place])     History reviewed. No pertinent surgical history.  Family History  Problem Relation Age of Onset  . Hypertension Mother   . Depression Mother   . Heart attack Maternal Grandfather   . Lymphoma Maternal Grandmother   . Diabetes Maternal Aunt   . Cancer Maternal Aunt     Social History   Tobacco Use  . Smoking status: Never Smoker  . Smokeless tobacco: Never Used  Substance Use Topics  . Alcohol use: Yes    Alcohol/week: 0.0 standard drinks    Comment: once every 2 weeks. Wine coolers  . Drug use: No    No current facility-administered medications for this encounter.   Current Outpatient Medications:  .  busPIRone (BUSPAR) 15 MG tablet, Take 1 tablet (15 mg total) by mouth 3 (three) times daily. For anxiety, Disp: 90 tablet, Rfl: 0 .  fluvoxaMINE (LUVOX) 100 MG tablet, Take 1 tablet (100 mg total) by mouth 2 (two) times daily. For depression, Disp: 60 tablet, Rfl: 0 .  hydrOXYzine (ATARAX/VISTARIL) 50 MG tablet, Take 1 tablet (50 mg total) by mouth every 6 (six) hours as needed for anxiety., Disp: 60 tablet, Rfl: 0 .  lisinopril (PRINIVIL,ZESTRIL) 5 MG tablet, Take 1 tablet (5 mg total) by mouth daily. For high blood pressure, Disp: 30 tablet, Rfl: 0 .  metFORMIN (GLUCOPHAGE) 1000 MG tablet, Take 1 tablet (1,000 mg total) by mouth 2 (two) times daily with a meal. For diabetes management, Disp: 60 tablet, Rfl: 0 .  ondansetron (ZOFRAN ODT) 8 MG disintegrating tablet, 1/2- 1 tablet q 8 hr prn nausea, vomiting, Disp: 20 tablet, Rfl: 0 .  pantoprazole (PROTONIX) 40 MG tablet, Take 1 tablet (40  mg total) by mouth daily. For acid reflux, Disp: 15 tablet, Rfl: 0 .  QUEtiapine (SEROQUEL) 50 MG tablet, Take 3 tablets (150 mg total) by mouth at bedtime. For mood control, Disp: 90 tablet, Rfl: 0 .  traZODone (DESYREL) 50 MG tablet, Take 1 tablet (50 mg total) by mouth at bedtime as needed for sleep., Disp: 30 tablet, Rfl: 0  Allergies  Allergen Reactions  . Bee Venom  Swelling  . Keflex [Cephalexin]     UPSET STOMACH  . Relafen [Nabumetone]     Blood in stool  . Shellfish Allergy Nausea Only     ROS  As noted in HPI.   Physical Exam  BP 118/84 (BP Location: Right Arm)   Pulse 93   Temp 98.2 F (36.8 C)   Resp 18   Wt 121.3 kg   SpO2 98%   BMI 36.27 kg/m   Constitutional: Well developed, well nourished, no acute distress Eyes: PERRL, EOMI, conjunctiva normal bilaterally HENT: Normocephalic, atraumatic,mucus membranes moist Respiratory: Clear to auscultation bilaterally, no rales, no wheezing, no rhonchi Cardiovascular: Normal rate and rhythm, no murmurs, no gallops, no rubs GI: Soft, nondistended, normal bowel sounds, nontender, no rebound, no guarding.  Negative Murphy, negative McBurney. Back: no CVAT skin: No rash, skin intact Musculoskeletal: No edema, no tenderness, no deformities Neurologic: Alert & oriented x 3, CN II-XII grossly intact, no motor deficits, sensation grossly intact Psychiatric: Speech and behavior appropriate   ED Course   Medications  ondansetron (ZOFRAN-ODT) disintegrating tablet 8 mg (8 mg Oral Given 07/18/18 1710)    Orders Placed This Encounter  Procedures  . Glucose, capillary    Standing Status:   Standing    Number of Occurrences:   1  . POC CBG monitoring    Standing Status:   Standing    Number of Occurrences:   1   Results for orders placed or performed during the hospital encounter of 07/18/18 (from the past 24 hour(s))  Glucose, capillary     Status: None   Collection Time: 07/18/18  4:45 PM  Result Value Ref Range   Glucose-Capillary 93 70 - 99 mg/dL   No results found.  ED Clinical Impression  Nausea vomiting and diarrhea   ED Assessment/Plan  Fingerstick is 93, this is not DKA.  Vitals are normal.  His abdomen is benign.  Patient denies alcohol or marijuana use.  Unsure as to the etiology of his symptoms, but he does not appear dehydrated.  We will have him keep a diary of his  activity, food and beverage intake and follow-up with his primary care physician.  States that he has a an appointment with her on Tuesday.  8 mg of Zofran here.  Will send home with some Zofran which will help with the nausea in addition to the diarrhea.  Giving him strict ER return precautions.  Discussed labs, MDM, treatment plan, and plan for follow-up with patient and parent.  Discussed sn/sx that should prompt return to the ED. they agree with plan.   Meds ordered this encounter  Medications  . ondansetron (ZOFRAN-ODT) disintegrating tablet 8 mg  . ondansetron (ZOFRAN ODT) 8 MG disintegrating tablet    Sig: 1/2- 1 tablet q 8 hr prn nausea, vomiting    Dispense:  20 tablet    Refill:  0    *This clinic note was created using [employer]. Therefore, there may be occasional mistakes despite careful proofreading.  ?   Melynda Ripple, MD 07/18/18 [phone]

## 2018-07-18 NOTE — ED Triage Notes (Signed)
Pt cc vomiting stomach pain and nausea this started today. Pt states this happens only on the weekends

## 2018-10-05 ENCOUNTER — Telehealth: Payer: Self-pay | Admitting: *Deleted

## 2018-10-05 NOTE — Telephone Encounter (Signed)
REFERRAL SENT TO SCHEDULING AND NOTES ON FILE FROM [facility] [phone] DR. TAMIKA LOTT

## 2018-10-19 ENCOUNTER — Telehealth: Payer: Self-pay | Admitting: Cardiology

## 2018-10-19 NOTE — Telephone Encounter (Signed)
Virtual Visit Pre-Appointment Phone Call  "(Name), I am calling you today to discuss your upcoming appointment. We are currently trying to limit exposure to the virus that causes COVID-19 by seeing patients at home rather than in the office."  1. "What is the BEST phone number to call the day of the visit?" - include this in appointment notes  2. Do you have or have access to (through a family member/friend) a smartphone with video capability that we can use for your visit?" a. If yes - list this number in appt notes as cell (if different from BEST phone #) and list the appointment type as a VIDEO visit in appointment notes b. If no - list the appointment type as a PHONE visit in appointment notes  Confirm consent - "In the setting of the current Covid19 crisis, you are scheduled for a (phone or video) visit with your provider on (date) at (time).  Just as we do with many in-office visits, in order for you to participate in this visit, we must obtain consent.  If you'd like, I can send this to your mychart (if signed up) or email for you to review.  Otherwise, I can obtain your verbal consent now.  All virtual visits are billed to your insurance company just like a normal visit would be.  By agreeing to a virtual visit, we'd like you to understand that the technology does not allow for your provider to perform an examination, and thus may limit your provider's ability to fully assess your condition. If your provider identifies any concerns that need to be evaluated in person, we will make arrangements to do so.  Finally, though the technology is pretty good, we cannot assure that it will always work on either your or our end, and in the setting of a video visit, we may have to convert it to a phone-only visit.  In either situation, we cannot ensure that we have a secure connection.  Are you willing to proceed?" STAFF: Did the patient verbally acknowledge consent to telehealth visit? Document  YES/NO here: YES 3. Advise patient to be prepared - "Two hours prior to your appointment, go ahead and check your blood pressure, pulse, oxygen saturation, and your weight (if you have the equipment to check those) and write them all down. When your visit starts, your provider will ask you for this information. If you have an Apple Watch or Kardia device, please plan to have heart rate information ready on the day of your appointment. Please have a pen and paper handy nearby the day of the visit as well."  4. Give patient instructions for MyChart download to smartphone OR [email or web address] as below if video visit (depending on what platform provider is using)  5. Inform patient they will receive a phone call 15 minutes prior to their appointment time (may be from unknown caller ID) so they should be prepared to answer    TELEPHONE CALL NOTE  Marc Long has been deemed a candidate for a follow-up tele-health visit to limit community exposure during the Covid-19 pandemic. I spoke with the patient via phone to ensure availability of phone/video source, confirm preferred email & phone number, and discuss instructions and expectations.  I reminded Marc Long to be prepared with any vital sign and/or heart rhythm information that could potentially be obtained via home monitoring, at the time of his visit. I reminded Marc Long to expect a phone call prior to his visit.  [name] 10/19/2018 4:17 PM   INSTRUCTIONS FOR DOWNLOADING THE MYCHART APP TO SMARTPHONE  - The patient must first make sure to have activated MyChart and know their login information - If Apple, go to [employer] and type in MyChart in the search bar and download the app. If Android, ask patient to go to [employer] and type in [place] in the search bar and download the app. The app is free but as with any other app downloads, their phone may require them to verify saved payment information or  Apple/Android password.  - The patient will need to then log into the app with their MyChart username and password, and select [facility] as their healthcare provider to link the account. When it is time for your visit, go to the MyChart app, find appointments, and click Begin Video Visit. Be sure to Select Allow for your device to access the Microphone and Camera for your visit. You will then be connected, and your provider will be with you shortly.  **If they have any issues connecting, or need assistance please contact MyChart service desk [phone])**  **If using a computer, in order to ensure the best quality for their visit they will need to use either of the following Internet Browsers: [employer], or Google Chrome**  IF USING DOXIMITY or DOXY.ME - The patient will receive a link just prior to their visit by text.     FULL LENGTH CONSENT FOR TELE-HEALTH VISIT   I hereby voluntarily request, consent and authorize [facility] and its employed or contracted physicians, physician assistants, nurse practitioners or other licensed health care professionals (the Practitioner), to provide me with telemedicine health care services (the Services") as deemed necessary by the treating Practitioner. I acknowledge and consent to receive the Services by the Practitioner via telemedicine. I understand that the telemedicine visit will involve communicating with the Practitioner through live audiovisual communication technology and the disclosure of certain medical information by electronic transmission. I acknowledge that I have been given the opportunity to request an in-person assessment or other available alternative prior to the telemedicine visit and am voluntarily participating in the telemedicine visit.  I understand that I have the right to withhold or withdraw my consent to the use of telemedicine in the course of my care at any time, without affecting my right to future care  or treatment, and that the Practitioner or I may terminate the telemedicine visit at any time. I understand that I have the right to inspect all information obtained and/or recorded in the course of the telemedicine visit and may receive copies of available information for a reasonable fee.  I understand that some of the potential risks of receiving the Services via telemedicine include:   Delay or interruption in medical evaluation due to technological equipment failure or disruption;  Information transmitted may not be sufficient (e.g. poor resolution of images) to allow for appropriate medical decision making by the Practitioner; and/or   In rare instances, security protocols could fail, causing a breach of personal health information.  Furthermore, I acknowledge that it is my responsibility to provide information about my medical history, conditions and care that is complete and accurate to the best of my ability. I acknowledge that Practitioner's advice, recommendations, and/or decision may be based on factors not within their control, such as incomplete or inaccurate data provided by me or distortions of diagnostic images or specimens that may result from electronic transmissions. I understand that the  practice of medicine is not an Chief Strategy Officer and that Practitioner makes no warranties or guarantees regarding treatment outcomes. I acknowledge that I will receive a copy of this consent concurrently upon execution via email to the email address I last provided but may also request a printed copy by calling the office of [facility].    I understand that my insurance will be billed for this visit.   I have read or had this consent read to me.  I understand the contents of this consent, which adequately explains the benefits and risks of the Services being provided via telemedicine.   I have been provided ample opportunity to ask questions regarding this consent and the Services and have had  my questions answered to my satisfaction.  I give my informed consent for the services to be provided through the use of telemedicine in my medical care  By participating in this telemedicine visit I agree to the above.

## 2018-10-22 ENCOUNTER — Telehealth (INDEPENDENT_AMBULATORY_CARE_PROVIDER_SITE_OTHER): Payer: Medicaid Other | Admitting: Cardiology

## 2018-10-22 ENCOUNTER — Other Ambulatory Visit: Payer: Self-pay

## 2018-10-22 ENCOUNTER — Encounter: Payer: Self-pay | Admitting: Cardiology

## 2018-10-22 VITALS — BP 109/78 | HR 87 | Ht 72.0 in | Wt 281.0 lb

## 2018-10-22 DIAGNOSIS — E119 Type 2 diabetes mellitus without complications: Secondary | ICD-10-CM

## 2018-10-22 DIAGNOSIS — R002 Palpitations: Secondary | ICD-10-CM

## 2018-10-22 DIAGNOSIS — R0789 Other chest pain: Secondary | ICD-10-CM | POA: Diagnosis not present

## 2018-10-22 DIAGNOSIS — I10 Essential (primary) hypertension: Secondary | ICD-10-CM

## 2018-10-22 DIAGNOSIS — R55 Syncope and collapse: Secondary | ICD-10-CM

## 2018-10-22 DIAGNOSIS — R079 Chest pain, unspecified: Secondary | ICD-10-CM

## 2018-10-22 NOTE — Progress Notes (Signed)
Virtual Visit via Video Note   This visit type was conducted due to national recommendations for restrictions regarding the COVID-19 Pandemic (e.g. social distancing) in an effort to limit this patient's exposure and mitigate transmission in our community.  Due to his co-morbid illnesses, this patient is at least at moderate risk for complications without adequate follow up.  This format is felt to be most appropriate for this patient at this time.  All issues noted in this document were discussed and addressed.  A limited physical exam was performed with this format.  Please refer to the patient's chart for his consent to telehealth for [facility].   Date:  10/22/2018   ID:  Marc Long, DOB [birth year], MRN [id number]  Patient Location: Home Provider Location: Home  PCP:  Vassie Moment, MD  Cardiologist:  No primary care provider on file.  Electrophysiologist:  None   Evaluation Performed:  New Patient Evaluation  Chief Complaint: Chest discomfort and micturition syncope  History of Present Illness:    Marc Long is a 43 y.o. male with past medical history of essential hypertension and diabetes mellitus.  The patient mentions to me that a couple of weeks ago he woke up in the middle of the night to pass urine and subsequently when he stood up he passed out.  He did not hurt himself.  After that he has had no such episodes.  He leads a sedentary lifestyle.  He is a non-smoker.  He mentions to me that he does not exercise on a regular basis.  Occasionally he will have chest discomfort and this is not related to exertion and no radiation to any part of the body.  It is like a stabbing sensation.  At the time of my evaluation, the patient is alert awake oriented and in no distress.  The patient does not have symptoms concerning for COVID-19 infection (fever, chills, cough, or new shortness of breath).    Past Medical History:  Diagnosis Date  . Depression   .  Diabetes mellitus without complication ([place])   . Gallstones   . Obesity, Class III, BMI 40-49.9 (morbid obesity) ([place])    History reviewed. No pertinent surgical history.   Current Meds  Medication Sig  . busPIRone (BUSPAR) 15 MG tablet Take 15 mg by mouth 3 (three) times daily.  . fluvoxaMINE (LUVOX) 100 MG tablet Take 1 tablet (100 mg total) by mouth 2 (two) times daily. For depression  . hydrOXYzine (ATARAX/VISTARIL) 50 MG tablet Take 1 tablet (50 mg total) by mouth every 6 (six) hours as needed for anxiety.  Marland Kitchen lisinopril (ZESTRIL) 20 MG tablet Take 20 mg by mouth daily.   . metFORMIN (GLUCOPHAGE) 1000 MG tablet Take 1 tablet (1,000 mg total) by mouth 2 (two) times daily with a meal. For diabetes management  . pantoprazole (PROTONIX) 40 MG tablet Take 1 tablet (40 mg total) by mouth daily. For acid reflux  . QUEtiapine (SEROQUEL) 50 MG tablet Take 3 tablets (150 mg total) by mouth at bedtime. For mood control  . traZODone (DESYREL) 50 MG tablet Take 1 tablet (50 mg total) by mouth at bedtime as needed for sleep.  . [DISCONTINUED] busPIRone (BUSPAR) 15 MG tablet Take 1 tablet (15 mg total) by mouth 3 (three) times daily. For anxiety  . [DISCONTINUED] ondansetron (ZOFRAN ODT) 8 MG disintegrating tablet 1/2- 1 tablet q 8 hr prn nausea, vomiting     Allergies:   Bee venom; Keflex [cephalexin]; Relafen [nabumetone]; and  Shellfish allergy   Social History   Tobacco Use  . Smoking status: Never Smoker  . Smokeless tobacco: Never Used  Substance Use Topics  . Alcohol use: Yes    Alcohol/week: 0.0 standard drinks    Comment: once every 2 weeks. Wine coolers  . Drug use: No     Family Hx: The patient's family history includes Cancer in his maternal aunt; Depression in his mother; Diabetes in his maternal aunt; Heart attack in his maternal grandfather; Hypertension in his mother; Lymphoma in his maternal grandmother.  ROS:   Please see the history of present illness.    As mentioned  above All other systems reviewed and are negative.   Prior CV studies:   The following studies were reviewed today:  I reviewed records from primary care physician office  Labs/Other Tests and Data Reviewed:    EKG:  EKG reveals sinus rhythm and nonspecific ST-T changes  Recent Labs: 12/05/2017: B Natriuretic Peptide 60.5 12/09/2017: Magnesium 2.1 04/06/2018: ALT 23; BUN 13; Creatinine, Ser 1.12; Hemoglobin 13.7; Platelets 303; Potassium 3.7; Sodium 140 04/07/2018: TSH 2.847   Recent Lipid Panel Lab Results  Component Value Date/Time   CHOL 172 04/07/2018 06:32 AM   CHOL 142 03/05/2017 09:25 AM   TRIG 186 (H) 04/07/2018 06:32 AM   HDL 31 (L) 04/07/2018 06:32 AM   HDL 27 (L) 03/05/2017 09:25 AM   CHOLHDL 5.5 04/07/2018 06:32 AM   LDLCALC 104 (H) 04/07/2018 06:32 AM   LDLCALC 85 03/05/2017 09:25 AM    Wt Readings from Last 3 Encounters:  10/22/18 281 lb (127.5 kg)  07/18/18 267 lb 6.4 oz (121.3 kg)  04/05/18 260 lb (117.9 kg)     Objective:    Vital Signs:  BP 109/78 (BP Location: Left Arm, Patient Position: Sitting, Cuff Size: Normal)   Pulse 87   Ht 6' (1.829 m)   Wt 281 lb (127.5 kg)   BMI 38.11 kg/m    VITAL SIGNS:  reviewed  ASSESSMENT & PLAN:    1. Micturition syncope: I reassured him about my findings.  Precautionary measures and fall precautions were mentioned and he vocalized understanding.  He has never had any other episodes other than in the setting of micturition and this was only one episode. 2. Diet was discussed for diabetes mellitus.  He gives history of some chest discomfort and evaluated we will have a Lexiscan sestamibi. 3. In view of essential hypertension I will do an echocardiogram to assess for any hypertensive cardiac disease 4. He has symptoms of palpitations and we will do a 2-week ZIO monitor.  His symptoms do concern him and he wants an evaluation 5. Patient will be seen in follow-up appointment in 2 months or earlier if the patient has  any concerns   COVID-19 Education: The signs and symptoms of COVID-19 were discussed with the patient and how to seek care for testing (follow up with PCP or arrange E-visit).  The importance of social distancing was discussed today.  Time:   Today, I have spent 30 minutes with the patient with telehealth technology discussing the above problems.     Medication Adjustments/Labs and Tests Ordered: Current medicines are reviewed at length with the patient today.  Concerns regarding medicines are outlined above.   Tests Ordered: No orders of the defined types were placed in this encounter.   Medication Changes: No orders of the defined types were placed in this encounter.   Disposition:  Follow up in 1 month(s)  Signed, Jenean Lindau, MD  10/22/2018 2:46 PM    [place]

## 2018-10-22 NOTE — Addendum Note (Signed)
Addended by: Beckey Rutter on: 10/22/2018 03:05 PM   Modules accepted: Orders

## 2018-10-22 NOTE — Patient Instructions (Addendum)
Medication Instructions:  Your physician recommends that you continue on your current medications as directed. Please refer to the Current Medication list given to you today.  If you need a refill on your cardiac medications before your next appointment, please call your pharmacy.   Lab work: NONE If you have labs (blood work) drawn today and your tests are completely normal, you will receive your results only by: Marland Kitchen MyChart Message (if you have MyChart) OR . A paper copy in the mail If you have any lab test that is abnormal or we need to change your treatment, we will call you to review the results.  Testing/Procedures: Your physician has recommended that you wear a ZIO monitor. ZIO monitors are medical devices that record the heart's electrical activity. Doctors most often use these monitors to diagnose arrhythmias. Arrhythmias are problems with the speed or rhythm of the heartbeat. The monitor is a small, portable device. You can wear one while you do your normal daily activities. This is usually used to diagnose what is causing palpitations/syncope (passing out).You will wear this device for 14 days.  Your physician has requested that you have an echocardiogram. Echocardiography is a painless test that uses sound waves to create images of your heart. It provides your doctor with information about the size and shape of your heart and how well your heart's chambers and valves are working. This procedure takes approximately one hour. There are no restrictions for this procedure.  Your physician has requested that you have a lexiscan myoview. For further information please visit [email or web address]. Please follow instruction sheet, as given.    [facility] [address], [place], [place] [address] Phone: [phone]  Oct 22, 2018    Marc Long DOB: 1975/11/23 MRN: [id number] [place] [id number]    Dear Marc Long,  You will be contacted to schedule for a Myocardial Perfusion Imaging Study on:  .Please arrive 15 minutes prior to your appointment time for registration and insurance purposes.  The test will take approximately 3 to 4 hours to complete; you may bring reading material.  If someone comes with you to your appointment, they will need to remain in the main lobby due to limited space in the testing area. **If you are pregnant or breastfeeding, please notify the nuclear lab prior to your appointment**  How to prepare for your Myocardial Perfusion Test: . Do not eat or drink 3 hours prior to your test, except you may have water. . Do not consume products containing caffeine (regular or decaffeinated) 12 hours prior to your test. (ex: coffee, chocolate, sodas, tea). Do bring a list of your current medications with you.  If not listed below, you may take your medications as normal. . Do not take metformin for 12 hours prior to the test on both days.  Bring the medication to your appointment as you may be required to take it once the test is complete. . Do wear comfortable clothes (no dresses or overalls) and walking shoes, tennis shoes preferred (No heels or open toe shoes are allowed). . Do NOT wear cologne, perfume, aftershave, or lotions (deodorant is allowed). . If these instructions are not followed, your test will have to be rescheduled.  Please report to [address] for your test.  If you have questions or concerns about your appointment, you can call the Nuclear Lab at [phone].  If you cannot keep your  appointment, please provide 24 hours notification to the Nuclear Lab, to avoid a possible $50 charge to your account.    Follow-Up: At [facility], you and your health needs are our priority.  As part of our continuing mission to provide you with exceptional heart care, we have created designated Provider Care Teams.  These Care Teams include your  primary Cardiologist (physician) and Advanced Practice Providers (APPs -  Physician Assistants and Nurse Practitioners) who all work together to provide you with the care you need, when you need it. You will need a follow up appointment in 2 months.    Any Other Special Instructions Will Be Listed Below Regadenoson injection What is this medicine? REGADENOSON is used to test the heart for coronary artery disease. It is used in patients who can not exercise for their stress test. This medicine may be used for other purposes; ask your health care provider or pharmacist if you have questions. COMMON BRAND NAME(S): Lexiscan What should I tell my health care provider before I take this medicine? They need to know if you have any of these conditions: -heart problems -lung or breathing disease, like asthma or COPD -an unusual or allergic reaction to regadenoson, other medicines, foods, dyes, or preservatives -pregnant or trying to get pregnant -breast-feeding How should I use this medicine? This medicine is for injection into a vein. It is given by a health care professional in a hospital or clinic setting. Talk to your pediatrician regarding the use of this medicine in children. Special care may be needed. Overdosage: If you think you have taken too much of this medicine contact a poison control center or emergency room at once. NOTE: This medicine is only for you. Do not share this medicine with others. What if I miss a dose? This does not apply. What may interact with this medicine? -caffeine -dipyridamole -guarana -theophylline This list may not describe all possible interactions. Give your health care provider a list of all the medicines, herbs, non-prescription drugs, or dietary supplements you use. Also tell them if you smoke, drink alcohol, or use illegal drugs. Some items may interact with your medicine. What should I watch for while using this medicine? Your condition will be  monitored carefully while you are receiving this medicine. Do not take medicines, foods, or drinks with caffeine (like coffee, tea, or colas) for at least 12 hours before your test. If you do not know if something contains caffeine, ask your health care professional. What side effects may I notice from receiving this medicine? Side effects that you should report to your doctor or health care professional as soon as possible: -allergic reactions like skin rash, itching or hives, swelling of the face, lips, or tongue -breathing problems -chest pain, tightness or palpitations -severe headache Side effects that usually do not require medical attention (report to your doctor or health care professional if they continue or are bothersome): -flushing -headache -irritation or pain at site where injected -nausea, vomiting This list may not describe all possible side effects. Call your doctor for medical advice about side effects. You may report side effects to FDA at 1-800-FDA-1088. Where should I keep my medicine? This drug is given in a hospital or clinic and will not be stored at home. NOTE: This sheet is a summary. It may not cover all possible information. If you have questions about this medicine, talk to your doctor, pharmacist, or health care provider.  2019 Elsevier/Gold Standard (2008-01-31 15:08:13)     Echocardiogram  An echocardiogram is a procedure that uses painless sound waves (ultrasound) to produce an image of the heart. Images from an echocardiogram can provide important information about:  Signs of coronary artery disease (CAD).  Aneurysm detection. An aneurysm is a weak or damaged part of an artery wall that bulges out from the normal force of blood pumping through the body.  Heart size and shape. Changes in the size or shape of the heart can be associated with certain conditions, including heart failure, aneurysm, and CAD.  Heart muscle function.  Heart valve function.   Signs of a past heart attack.  Fluid buildup around the heart.  Thickening of the heart muscle.  A tumor or infectious growth around the heart valves. Tell a health care provider about:  Any allergies you have.  All medicines you are taking, including vitamins, herbs, eye drops, creams, and over-the-counter medicines.  Any blood disorders you have.  Any surgeries you have had.  Any medical conditions you have.  Whether you are pregnant or may be pregnant. What are the risks? Generally, this is a safe procedure. However, problems may occur, including:  Allergic reaction to dye (contrast) that may be used during the procedure. What happens before the procedure? No specific preparation is needed. You may eat and drink normally. What happens during the procedure?   An IV tube may be inserted into one of your veins.  You may receive contrast through this tube. A contrast is an injection that improves the quality of the pictures from your heart.  A gel will be applied to your chest.  A wand-like tool (transducer) will be moved over your chest. The gel will help to transmit the sound waves from the transducer.  The sound waves will harmlessly bounce off of your heart to allow the heart images to be captured in real-time motion. The images will be recorded on a computer. The procedure may vary among health care providers and hospitals. What happens after the procedure?  You may return to your normal, everyday life, including diet, activities, and medicines, unless your health care provider tells you not to do that. Summary  An echocardiogram is a procedure that uses painless sound waves (ultrasound) to produce an image of the heart.  Images from an echocardiogram can provide important information about the size and shape of your heart, heart muscle function, heart valve function, and fluid buildup around your heart.  You do not need to do anything to prepare before this  procedure. You may eat and drink normally.  After the echocardiogram is completed, you may return to your normal, everyday life, unless your health care provider tells you not to do that. This information is not intended to replace advice given to you by your health care provider. Make sure you discuss any questions you have with your health care provider. Document Released: 05/30/2000 Document Revised: 07/05/2016 Document Reviewed: 07/05/2016 Elsevier Interactive Patient Education  2019 [employer].  Cardiac Nuclear Scan A cardiac nuclear scan is a test that is done to check the flow of blood to your heart. It is done when you are resting and when you are exercising. The test looks for problems such as:  Not enough blood reaching a portion of the heart.  The heart muscle not working as it should. You may need this test if:  You have heart disease.  You have had lab results that are not normal.  You have had heart surgery or a balloon procedure to open  up blocked arteries (angioplasty).  You have chest pain.  You have shortness of breath. In this test, a special dye (tracer) is put into your bloodstream. The tracer will travel to your heart. A camera will then take pictures of your heart to see how the tracer moves through your heart. This test is usually done at a hospital and takes 2-4 hours. Tell a doctor about:  Any allergies you have.  All medicines you are taking, including vitamins, herbs, eye drops, creams, and over-the-counter medicines.  Any problems you or family members have had with anesthetic medicines.  Any blood disorders you have.  Any surgeries you have had.  Any medical conditions you have.  Whether you are pregnant or may be pregnant. What are the risks? Generally, this is a safe test. However, problems may occur, such as:  Serious chest pain and heart attack. This is only a risk if the stress portion of the test is done.  Rapid heartbeat.  A  feeling of warmth in your chest. This feeling usually does not last long.  Allergic reaction to the tracer. What happens before the test?  Ask your doctor about changing or stopping your normal medicines. This is important.  Follow instructions from your doctor about what you cannot eat or drink.  Remove your jewelry on the day of the test. What happens during the test?  An IV tube will be inserted into one of your veins.  Your doctor will give you a small amount of tracer through the IV tube.  You will wait for 20-40 minutes while the tracer moves through your bloodstream.  Your heart will be monitored with an electrocardiogram (ECG).  You will lie down on an exam table.  Pictures of your heart will be taken for about 15-20 minutes.  You may also have a stress test. For this test, one of these things may be done: ? You will be asked to exercise on a treadmill or a stationary bike. ? You will be given medicines that will make your heart work harder. This is done if you are unable to exercise.  When blood flow to your heart has peaked, a tracer will again be given through the IV tube.  After 20-40 minutes, you will get back on the exam table. More pictures will be taken of your heart.  Depending on the tracer that is used, more pictures may need to be taken 3-4 hours later.  Your IV tube will be removed when the test is over. The test may vary among doctors and hospitals. What happens after the test?  Ask your doctor: ? Whether you can return to your normal schedule, including diet, activities, and medicines. ? Whether you should drink more fluids. This will help to remove the tracer from your body. Drink enough fluid to keep your pee (urine) pale yellow.  Ask your doctor, or the department that is doing the test: ? When will my results be ready? ? How will I get my results? Summary  A cardiac nuclear scan is a test that is done to check the flow of blood to your heart.   Tell your doctor whether you are pregnant or may be pregnant.  Before the test, ask your doctor about changing or stopping your normal medicines. This is important.  Ask your doctor whether you can return to your normal activities. You may be asked to drink more fluids. This information is not intended to replace advice given to you by your health  care provider. Make sure you discuss any questions you have with your health care provider. Document Released: 11/16/2017 Document Revised: 11/16/2017 Document Reviewed: 11/16/2017 Elsevier Interactive Patient Education  2019 [employer].

## 2018-10-22 NOTE — Addendum Note (Signed)
Addended by: Beckey Rutter on: 10/22/2018 04:30 PM   Modules accepted: Orders

## 2018-10-26 ENCOUNTER — Telehealth: Payer: Self-pay | Admitting: *Deleted

## 2018-10-26 NOTE — Telephone Encounter (Signed)
[employer] to mail 14 day ZIO XT long term holter monitor to his home.  Reviewed instructions briefly as they are included in the monitor kit.

## 2018-10-28 ENCOUNTER — Telehealth (HOSPITAL_COMMUNITY): Payer: Self-pay

## 2018-10-28 NOTE — Telephone Encounter (Signed)
Encounter complete. 

## 2018-10-29 ENCOUNTER — Ambulatory Visit (HOSPITAL_COMMUNITY)
Admission: RE | Admit: 2018-10-29 | Discharge: 2018-10-29 | Disposition: A | Discharge status: Home / Self Care | Payer: Medicaid Other | Source: Ambulatory Visit | Attending: Cardiology | Admitting: Cardiology

## 2018-10-29 ENCOUNTER — Telehealth (HOSPITAL_COMMUNITY): Payer: Self-pay | Admitting: Radiology

## 2018-10-29 ENCOUNTER — Other Ambulatory Visit: Payer: Self-pay

## 2018-10-29 DIAGNOSIS — R079 Chest pain, unspecified: Secondary | ICD-10-CM

## 2018-10-29 LAB — MYOCARDIAL PERFUSION IMAGING
LV dias vol: 132 mL (ref 62–150)
LV sys vol: 75 mL
Peak HR: 81 {beats}/min
Rest HR: 64 {beats}/min
SDS: 0
SRS: 6
SSS: 6
TID: 1.31

## 2018-10-29 MED ORDER — REGADENOSON 0.4 MG/5ML IV SOLN
0.4000 mg | Freq: Once | INTRAVENOUS | Status: AC
  Administered 2018-10-29: 0.4 mg via INTRAVENOUS

## 2018-10-29 MED ORDER — TECHNETIUM TC 99M TETROFOSMIN IV KIT
26.9000 | PACK | Freq: Once | INTRAVENOUS | Status: AC | PRN
  Administered 2018-10-29: 26.9 via INTRAVENOUS
  Filled 2018-10-29: qty 27

## 2018-10-29 MED ORDER — TECHNETIUM TC 99M TETROFOSMIN IV KIT
8.1000 | PACK | Freq: Once | INTRAVENOUS | Status: AC | PRN
  Administered 2018-10-29: 8.1 via INTRAVENOUS
  Filled 2018-10-29: qty 9

## 2018-10-29 NOTE — Telephone Encounter (Signed)
Left echocardiogram instructions. Unable to perform COVID 19 screening.

## 2018-11-01 ENCOUNTER — Telehealth (HOSPITAL_COMMUNITY): Payer: Self-pay

## 2018-11-01 ENCOUNTER — Ambulatory Visit (INDEPENDENT_AMBULATORY_CARE_PROVIDER_SITE_OTHER): Payer: Medicaid Other

## 2018-11-01 DIAGNOSIS — R002 Palpitations: Secondary | ICD-10-CM | POA: Diagnosis not present

## 2018-11-01 DIAGNOSIS — I10 Essential (primary) hypertension: Secondary | ICD-10-CM

## 2018-11-01 DIAGNOSIS — R0789 Other chest pain: Secondary | ICD-10-CM | POA: Diagnosis not present

## 2018-11-01 NOTE — Telephone Encounter (Signed)

## 2018-11-02 ENCOUNTER — Ambulatory Visit (HOSPITAL_COMMUNITY): Payer: Medicaid Other | Attending: Cardiology

## 2018-11-02 ENCOUNTER — Other Ambulatory Visit: Payer: Self-pay

## 2018-11-02 DIAGNOSIS — I10 Essential (primary) hypertension: Secondary | ICD-10-CM | POA: Diagnosis not present

## 2018-11-02 DIAGNOSIS — R0789 Other chest pain: Secondary | ICD-10-CM

## 2018-11-16 ENCOUNTER — Telehealth: Payer: Self-pay | Admitting: *Deleted

## 2018-11-16 ENCOUNTER — Telehealth: Payer: Self-pay

## 2018-11-16 NOTE — Telephone Encounter (Signed)
-----   Message from Austin Miles, RN sent at 11/15/2018  4:24 PM EDT -----  ----- Message ----- From: Jenean Lindau, MD Sent: 11/02/2018   1:53 PM EDT To: Mattie Marlin, RN  Please set him up for a daily appointment to discuss this.  His echocardiogram and stress test have abnormalities.  Nonurgent.  Can be done in the next few days. Jenean Lindau, MD 11/02/2018 1:53 PM

## 2018-11-16 NOTE — Telephone Encounter (Signed)
YOUR CARDIOLOGY TEAM HAS ARRANGED FOR AN E-VISIT FOR YOUR APPOINTMENT - PLEASE REVIEW IMPORTANT INFORMATION BELOW SEVERAL DAYS PRIOR TO YOUR APPOINTMENT  Due to the recent COVID-19 pandemic, we are transitioning in-person office visits to tele-medicine visits in an effort to decrease unnecessary exposure to our patients, their families, and staff. These visits are billed to your insurance just like a normal visit is. We also encourage you to sign up for MyChart if you have not already done so. You will need a smartphone if possible. For patients that do not have this, we can still complete the visit using a regular telephone but do prefer a smartphone to enable video when possible. You may have a family member that lives with you that can help. If possible, we also ask that you have a blood pressure cuff and scale at home to measure your blood pressure, heart rate and weight prior to your scheduled appointment. Patients with clinical needs that need an in-person evaluation and testing will still be able to come to the office if absolutely necessary. If you have any questions, feel free to call our office.     YOUR PROVIDER WILL BE USING THE FOLLOWING PLATFORM TO COMPLETE YOUR VISIT: Staff: Please delete this text and fill in MyChart/Doximity/Doxy.Me  . IF USING MYCHART - How to Download the MyChart App to Your SmartPhone   - If Apple, go to App Store and type in MyChart in the search bar and download the app. If Android, ask patient to go to Google Play Store and type in MyChart in the search bar and download the app. The app is free but as with any other app downloads, your phone may require you to verify saved payment information or Apple/Android password.  - You will need to then log into the app with your MyChart username and password, and select [facility] as your healthcare provider to link the account.  - When it is time for your visit, go to the MyChart app, find appointments, and click Begin  Video Visit. Be sure to Select Allow for your device to access the Microphone and Camera for your visit. You will then be connected, and your provider will be with you shortly.  **If you have any issues connecting or need assistance, please contact MyChart service desk [phone] ([phone])**  **If using a computer, in order to ensure the best quality for your visit, you will need to use either of the following Internet Browsers: Google Chrome or Microsoft Edge**  . IF USING DOXIMITY or [email or web address].ME - The staff will give you instructions on receiving your link to join the meeting the day of your visit.      2-3 DAYS BEFORE YOUR APPOINTMENT  You will receive a telephone call from one of our [facility] team members - your caller ID may say "Unknown caller." If this is a video visit, we will walk you through how to get the video launched on your phone. We will remind you check your blood pressure, heart rate and weight prior to your scheduled appointment. If you have an Apple Watch or Kardia, please upload any pertinent ECG strips the day before or morning of your appointment to MyChart. Our staff will also make sure you have reviewed the consent and agree to move forward with your scheduled tele-health visit.     THE DAY OF YOUR APPOINTMENT  Approximately 15 minutes prior to your scheduled appointment, you will receive a telephone call from one of HeartCare team -   your caller ID may say "Unknown caller."  Our staff will confirm medications, vital signs for the day and any symptoms you may be experiencing. Please have this information available prior to the time of visit start. It may also be helpful for you to have a pad of paper and pen handy for any instructions given during your visit. They will also walk you through joining the smartphone meeting if this is a video visit.    CONSENT FOR TELE-HEALTH VISIT - PLEASE REVIEW  I hereby voluntarily request, consent and authorize [facility] and  its employed or contracted physicians, physician assistants, nurse practitioners or other licensed health care professionals (the Practitioner), to provide me with telemedicine health care services (the "Services") as deemed necessary by the treating Practitioner. I acknowledge and consent to receive the Services by the Practitioner via telemedicine. I understand that the telemedicine visit will involve communicating with the Practitioner through live audiovisual communication technology and the disclosure of certain medical information by electronic transmission. I acknowledge that I have been given the opportunity to request an in-person assessment or other available alternative prior to the telemedicine visit and am voluntarily participating in the telemedicine visit.  I understand that I have the right to withhold or withdraw my consent to the use of telemedicine in the course of my care at any time, without affecting my right to future care or treatment, and that the Practitioner or I may terminate the telemedicine visit at any time. I understand that I have the right to inspect all information obtained and/or recorded in the course of the telemedicine visit and may receive copies of available information for a reasonable fee.  I understand that some of the potential risks of receiving the Services via telemedicine include:  Marland Kitchen Delay or interruption in medical evaluation due to technological equipment failure or disruption; . Information transmitted may not be sufficient (e.g. poor resolution of images) to allow for appropriate medical decision making by the Practitioner; and/or  . In rare instances, security protocols could fail, causing a breach of personal health information.  Furthermore, I acknowledge that it is my responsibility to provide information about my medical history, conditions and care that is complete and accurate to the best of my ability. I acknowledge that Practitioner's advice,  recommendations, and/or decision may be based on factors not within their control, such as incomplete or inaccurate data provided by me or distortions of diagnostic images or specimens that may result from electronic transmissions. I understand that the practice of medicine is not an exact science and that Practitioner makes no warranties or guarantees regarding treatment outcomes. I acknowledge that I will receive a copy of this consent concurrently upon execution via email to the email address I last provided but may also request a printed copy by calling the office of [place].    I understand that my insurance will be billed for this visit.   I have read or had this consent read to me. . I understand the contents of this consent, which adequately explains the benefits and risks of the Services being provided via telemedicine.  . I have been provided ample opportunity to ask questions regarding this consent and the Services and have had my questions answered to my satisfaction. . I give my informed consent for the services to be provided through the use of telemedicine in my medical care  By participating in this telemedicine visit I agree to the above.Pt gives consent for Virtual Visit

## 2018-11-16 NOTE — Telephone Encounter (Signed)
Left message for patient to call back to scheduled a virtual visit this week to discuss results.

## 2018-11-19 ENCOUNTER — Encounter: Payer: Self-pay | Admitting: Cardiology

## 2018-11-19 ENCOUNTER — Telehealth (INDEPENDENT_AMBULATORY_CARE_PROVIDER_SITE_OTHER): Payer: Medicaid Other | Admitting: Cardiology

## 2018-11-19 ENCOUNTER — Other Ambulatory Visit: Payer: Self-pay

## 2018-11-19 VITALS — BP 124/93 | HR 78 | Ht 73.0 in | Wt 280.0 lb

## 2018-11-19 DIAGNOSIS — I10 Essential (primary) hypertension: Secondary | ICD-10-CM | POA: Diagnosis not present

## 2018-11-19 DIAGNOSIS — E088 Diabetes mellitus due to underlying condition with unspecified complications: Secondary | ICD-10-CM | POA: Insufficient documentation

## 2018-11-19 DIAGNOSIS — R0789 Other chest pain: Secondary | ICD-10-CM | POA: Diagnosis not present

## 2018-11-19 DIAGNOSIS — E782 Mixed hyperlipidemia: Secondary | ICD-10-CM

## 2018-11-19 DIAGNOSIS — E669 Obesity, unspecified: Secondary | ICD-10-CM | POA: Diagnosis not present

## 2018-11-19 HISTORY — DX: Diabetes mellitus due to underlying condition with unspecified complications: E08.8

## 2018-11-19 NOTE — Patient Instructions (Signed)
Medication Instructions:  Your physician has recommended you make the following change in your medication:  START taking aspirin 81 mg (1 tablet ) once daily  If you need a refill on your cardiac medications before your next appointment, please call your pharmacy.   Lab work: NONE If you have labs (blood work) drawn today and your tests are completely normal, you will receive your results only by: Marland Kitchen MyChart Message (if you have MyChart) OR . A paper copy in the mail If you have any lab test that is abnormal or we need to change your treatment, we will call you to review the results.  Testing/Procedures: NONE  Follow-Up: At [facility], you and your health needs are our priority.  As part of our continuing mission to provide you with exceptional heart care, we have created designated Provider Care Teams.  These Care Teams include your primary Cardiologist (physician) and Advanced Practice Providers (APPs -  Physician Assistants and Nurse Practitioners) who all work together to provide you with the care you need, when you need it. You will need a follow up appointment in 4 months.

## 2018-11-19 NOTE — Progress Notes (Signed)
Virtual Visit via Video Note   This visit type was conducted due to national recommendations for restrictions regarding the COVID-19 Pandemic (e.g. social distancing) in an effort to limit this patient's exposure and mitigate transmission in our community.  Due to his co-morbid illnesses, this patient is at least at moderate risk for complications without adequate follow up.  This format is felt to be most appropriate for this patient at this time.  All issues noted in this document were discussed and addressed.  A limited physical exam was performed with this format.  Please refer to the patient's chart for his consent to telehealth for [facility].   Date:  11/19/2018   ID:  Marc Long, DOB 1975-07-23, MRN [id number]  Patient Location: Home Provider Location: Home  PCP:  Vassie Moment, MD  Cardiologist:  No primary care provider on file.  Electrophysiologist:  None   Evaluation Performed:  Follow-Up Visit  Chief Complaint: Chest discomfort  History of Present Illness:    Marc Long is a 42 y.o. male with past medical history of essential hypertension and diabetes mellitus. the patient was evaluated for chest discomfort.  His stress test was unremarkable and there was no evidence of ischemia and echocardiogram revealed preserved ejection fraction.  Subsequently he has done fine.  He leads a sedentary lifestyle.  No chest pain orthopnea or PND.  At the time of my evaluation, the patient is alert awake oriented and in no distress.  The patient does not have symptoms concerning for COVID-19 infection (fever, chills, cough, or new shortness of breath).    Past Medical History:  Diagnosis Date  . Depression   . Diabetes mellitus without complication ([place])   . Gallstones   . Obesity, Class III, BMI 40-49.9 (morbid obesity) ([place])    History reviewed. No pertinent surgical history.   Current Meds  Medication Sig  . busPIRone (BUSPAR) 15 MG tablet Take 15 mg by  mouth 3 (three) times daily.  . fluvoxaMINE (LUVOX) 100 MG tablet Take 1 tablet (100 mg total) by mouth 2 (two) times daily. For depression  . hydrOXYzine (ATARAX/VISTARIL) 50 MG tablet Take 1 tablet (50 mg total) by mouth every 6 (six) hours as needed for anxiety.  Marland Kitchen lisinopril (ZESTRIL) 20 MG tablet Take 20 mg by mouth daily.   . metFORMIN (GLUCOPHAGE) 1000 MG tablet Take 1 tablet (1,000 mg total) by mouth 2 (two) times daily with a meal. For diabetes management  . pantoprazole (PROTONIX) 40 MG tablet Take 1 tablet (40 mg total) by mouth daily. For acid reflux  . QUEtiapine (SEROQUEL) 50 MG tablet Take 3 tablets (150 mg total) by mouth at bedtime. For mood control  . traZODone (DESYREL) 50 MG tablet Take 1 tablet (50 mg total) by mouth at bedtime as needed for sleep.     Allergies:   Fish allergy; Bee venom; Keflex [cephalexin]; Relafen [nabumetone]; and Shellfish allergy   Social History   Tobacco Use  . Smoking status: Never Smoker  . Smokeless tobacco: Never Used  Substance Use Topics  . Alcohol use: Yes    Alcohol/week: 0.0 standard drinks    Comment: once every 2 weeks. Wine coolers  . Drug use: No     Family Hx: The patient's family history includes Cancer in his maternal aunt; Depression in his mother; Diabetes in his maternal aunt; Heart attack in his maternal grandfather; Hypertension in his mother; Lymphoma in his maternal grandmother.  ROS:   Please see the history  of present illness.    As above All other systems reviewed and are negative.   Prior CV studies:   The following studies were reviewed today:  Results of stress test and echocardiogram were discussed with the patient.  They were unremarkable.  Labs/Other Tests and Data Reviewed:    EKG:  No ECG reviewed.  Recent Labs: 12/05/2017: B Natriuretic Peptide 60.5 12/09/2017: Magnesium 2.1 04/06/2018: ALT 23; BUN 13; Creatinine, Ser 1.12; Hemoglobin 13.7; Platelets 303; Potassium 3.7; Sodium 140 04/07/2018:  TSH 2.847   Recent Lipid Panel Lab Results  Component Value Date/Time   CHOL 172 04/07/2018 06:32 AM   CHOL 142 03/05/2017 09:25 AM   TRIG 186 (H) 04/07/2018 06:32 AM   HDL 31 (L) 04/07/2018 06:32 AM   HDL 27 (L) 03/05/2017 09:25 AM   CHOLHDL 5.5 04/07/2018 06:32 AM   LDLCALC 104 (H) 04/07/2018 06:32 AM   LDLCALC 85 03/05/2017 09:25 AM    Wt Readings from Last 3 Encounters:  11/19/18 280 lb (127 kg)  10/29/18 281 lb (127.5 kg)  10/22/18 281 lb (127.5 kg)     Objective:    Vital Signs:  BP (!) 124/93 (BP Location: Left Arm, Patient Position: Sitting, Cuff Size: Normal)   Pulse 78   Ht 6\' 1"  (1.854 m)   Wt 280 lb (127 kg)   BMI 36.94 kg/m    VITAL SIGNS:  reviewed  ASSESSMENT & PLAN:    1. Chest discomfort: I reassured the patient about my findings.  I told him to about half an hour a day at least do that at least 5 days a week and he plans to do that in a graduated in a graded fashion. Essential hypertension: Blood pressure stable Mixed dyslipidemia: Diet was discussed.  He is a diabetic and needs to be on statin therapy.  I also asked him to initiate enteric-coated aspirin 81 mg after consultation with his primary care physician. Patient will be seen in follow-up appointment in 4 months or earlier if the patient has any concerns   COVID-19 Education: The signs and symptoms of COVID-19 were discussed with the patient and how to seek care for testing (follow up with PCP or arrange E-visit).  The importance of social distancing was discussed today.  Time:   Today, I have spent 15 minutes with the patient with telehealth technology discussing the above problems.     Medication Adjustments/Labs and Tests Ordered: Current medicines are reviewed at length with the patient today.  Concerns regarding medicines are outlined above.   Tests Ordered: No orders of the defined types were placed in this encounter.   Medication Changes: No orders of the defined types were placed  in this encounter.   Disposition:  Follow up in 4 month(s)  Signed, Jenean Lindau, MD  11/19/2018 4:16 PM    [facility]

## 2018-11-30 ENCOUNTER — Other Ambulatory Visit: Payer: Self-pay

## 2018-12-10 ENCOUNTER — Telehealth: Payer: Self-pay

## 2018-12-10 NOTE — Telephone Encounter (Signed)
Left message that results were ok, call back if more information is needed. Copy sent to Dr. Lavonia Drafts per Dr. Geraldo Pitter request.

## 2018-12-10 NOTE — Telephone Encounter (Signed)
-----   Message from Jenean Lindau, MD sent at 12/07/2018  9:44 AM EDT ----- The results of the study is unremarkable. Please inform patient. I will discuss in detail at next appointment. Cc  primary care/referring physician Jenean Lindau, MD 12/07/2018 9:44 AM

## 2018-12-27 ENCOUNTER — Telehealth: Payer: Medicaid Other | Admitting: Cardiology

## 2019-01-07 ENCOUNTER — Inpatient Hospital Stay (HOSPITAL_COMMUNITY)
Admission: RE | Admit: 2019-01-07 | Discharge: 2019-01-14 | DRG: 885 | Disposition: A | Discharge status: Home / Self Care | Payer: Medicaid Other | Attending: Psychiatry | Admitting: Psychiatry

## 2019-01-07 ENCOUNTER — Encounter (HOSPITAL_COMMUNITY): Payer: Self-pay

## 2019-01-07 ENCOUNTER — Other Ambulatory Visit: Payer: Self-pay

## 2019-01-07 DIAGNOSIS — F209 Schizophrenia, unspecified: Principal | ICD-10-CM | POA: Diagnosis present

## 2019-01-07 DIAGNOSIS — Z560 Unemployment, unspecified: Secondary | ICD-10-CM

## 2019-01-07 DIAGNOSIS — F332 Major depressive disorder, recurrent severe without psychotic features: Secondary | ICD-10-CM | POA: Diagnosis not present

## 2019-01-07 DIAGNOSIS — Z91030 Bee allergy status: Secondary | ICD-10-CM | POA: Diagnosis not present

## 2019-01-07 DIAGNOSIS — Z818 Family history of other mental and behavioral disorders: Secondary | ICD-10-CM | POA: Diagnosis not present

## 2019-01-07 DIAGNOSIS — I10 Essential (primary) hypertension: Secondary | ICD-10-CM | POA: Diagnosis present

## 2019-01-07 DIAGNOSIS — Z6841 Body Mass Index (BMI) 40.0 and over, adult: Secondary | ICD-10-CM | POA: Diagnosis not present

## 2019-01-07 DIAGNOSIS — Z915 Personal history of self-harm: Secondary | ICD-10-CM

## 2019-01-07 DIAGNOSIS — K219 Gastro-esophageal reflux disease without esophagitis: Secondary | ICD-10-CM | POA: Diagnosis present

## 2019-01-07 DIAGNOSIS — Z881 Allergy status to other antibiotic agents status: Secondary | ICD-10-CM | POA: Diagnosis not present

## 2019-01-07 DIAGNOSIS — Z91013 Allergy to seafood: Secondary | ICD-10-CM | POA: Diagnosis not present

## 2019-01-07 DIAGNOSIS — R45851 Suicidal ideations: Secondary | ICD-10-CM | POA: Diagnosis present

## 2019-01-07 DIAGNOSIS — E119 Type 2 diabetes mellitus without complications: Secondary | ICD-10-CM | POA: Diagnosis present

## 2019-01-07 DIAGNOSIS — Z888 Allergy status to other drugs, medicaments and biological substances status: Secondary | ICD-10-CM

## 2019-01-07 DIAGNOSIS — Z79899 Other long term (current) drug therapy: Secondary | ICD-10-CM | POA: Diagnosis not present

## 2019-01-07 DIAGNOSIS — F319 Bipolar disorder, unspecified: Secondary | ICD-10-CM | POA: Diagnosis present

## 2019-01-07 DIAGNOSIS — Z7984 Long term (current) use of oral hypoglycemic drugs: Secondary | ICD-10-CM

## 2019-01-07 DIAGNOSIS — F200 Paranoid schizophrenia: Secondary | ICD-10-CM | POA: Diagnosis not present

## 2019-01-07 DIAGNOSIS — Z20828 Contact with and (suspected) exposure to other viral communicable diseases: Secondary | ICD-10-CM | POA: Diagnosis present

## 2019-01-07 LAB — SARS CORONAVIRUS 2 BY RT PCR (HOSPITAL ORDER, PERFORMED IN ~~LOC~~ HOSPITAL LAB): SARS Coronavirus 2: NEGATIVE

## 2019-01-07 LAB — GLUCOSE, CAPILLARY: Glucose-Capillary: 122 mg/dL — ABNORMAL HIGH (ref 70–99)

## 2019-01-07 MED ORDER — ZIPRASIDONE MESYLATE 20 MG IM SOLR: 20.0000 mg | INTRAMUSCULAR | Status: DC | PRN

## 2019-01-07 MED ORDER — HYDROXYZINE HCL 25 MG PO TABS
25.0000 mg | ORAL_TABLET | Freq: Four times a day (QID) | ORAL | Status: DC | PRN
  Administered 2019-01-07: 25 mg via ORAL
  Filled 2019-01-07: qty 1

## 2019-01-07 MED ORDER — TRAZODONE HCL 50 MG PO TABS
50.0000 mg | ORAL_TABLET | Freq: Every evening | ORAL | Status: DC | PRN
  Administered 2019-01-07: 50 mg via ORAL
  Filled 2019-01-07 (×6): qty 1

## 2019-01-07 MED ORDER — ALUM & MAG HYDROXIDE-SIMETH 200-200-20 MG/5ML PO SUSP: 30.0000 mL | ORAL | Status: DC | PRN

## 2019-01-07 MED ORDER — OLANZAPINE 10 MG PO TBDP
10.0000 mg | ORAL_TABLET | Freq: Three times a day (TID) | ORAL | Status: DC | PRN
  Administered 2019-01-12 – 2019-01-13 (×2): 10 mg via ORAL
  Filled 2019-01-07 (×2): qty 1

## 2019-01-07 MED ORDER — MAGNESIUM HYDROXIDE 400 MG/5ML PO SUSP: 30.0000 mL | Freq: Every day | ORAL | Status: DC | PRN

## 2019-01-07 MED ORDER — LORAZEPAM 1 MG PO TABS
1.0000 mg | ORAL_TABLET | ORAL | Status: AC | PRN
  Administered 2019-01-13: 1 mg via ORAL
  Filled 2019-01-07: qty 1

## 2019-01-07 MED ORDER — ACETAMINOPHEN 325 MG PO TABS: 650.0000 mg | ORAL_TABLET | Freq: Four times a day (QID) | ORAL | Status: DC | PRN

## 2019-01-07 NOTE — H&P (Signed)
Behavioral Health Medical Screening Exam  Marc Long is an 43 year old male, known to our unit from prior admissions in 2019. He states he has had increased depression/sadness recently, particularly over the past few weeks. Does not identify any specific triggers or stressors. Reports " it's like I don't see the point of life anymore". He has had thoughts of slitting his throat with a knife . Of note , he reports history of intermittent visual imagery ( not hallucinations) of him committing suicide, but states this has improved significantly with medication management he is on .  Endorses some neuro-vegetative symptoms as below. Denies psychotic symptoms.  He also reports that he struggles with a sense of low self esteem , depression due to having intermittent sexual fantasies of rape, which " makes me feel like I am a bad person". He states he does not act out on these thoughts, and denies any plan or intention of actually hurting anyone.  Total Time spent with patient: 20 minutes  Psychiatric Specialty Exam: Physical Exam  ROS  There were no vitals taken for this visit.There is no height or weight on file to calculate BMI.  General Appearance: Fairly Groomed  Eye Contact:  Minimal  Speech:  Slow  Volume:  Decreased  Mood:  Depressed and Dysphoric  Affect:  Depressed  Thought Process:  Linear and Descriptions of Associations: Tangential  Orientation:  Full (Time, Place, and Person)  Thought Content:  Logical  Suicidal Thoughts:  No  Homicidal Thoughts:  No  Memory:  Immediate;   Fair Recent;   Fair  Judgement:  Fair  Insight:  Shallow  Psychomotor Activity:  Normal  Concentration: Concentration: Poor and Attention Span: Poor  Recall:  [place]: Fair  Akathisia:  No  Handed:  Right  AIMS (if indicated):     Assets:  Communication Skills Desire for Improvement Financial Resources/Insurance Leisure Time Physical Health Social  Support Vocational/Educational  Sleep:       Musculoskeletal: Strength & Muscle Tone: within normal limits Gait & Station: normal Patient leans: N/A  There were no vitals taken for this visit.  Recommendations: Will admit to OBS at this time.  Based on my evaluation the patient appears to have an emergency medical condition for which I recommend the patient be transferred to the emergency department for further evaluation.  Suella Broad, FNP 01/07/2019, 7:34 PM

## 2019-01-07 NOTE — BH Assessment (Addendum)
Assessment Note  Marc Long is an 43 y.o. male that presents this date voluntary with S/I. Patient voices a plan to "cut his throat" or overdose on his medications. Patient denies any H/I or AVH. Patient does voice thoughts that he has "fantasies" about "raping women" when he gets angry. Patient states his trigger this date was prompted by his "male friend" that he has been corresponding with informed him that she had COVID and could no longer see him. Patient states he does not know if she actually contracted COVID and voices she "might have just made that up to get rid of him." Patient states he became angry after speaking with her earlier this date and started have thoughts of self harm along with thoughts of sexually assaulting women. Patient states he has never acted on any of these impulses and they are "just fantasies." Patient denies any intent on wanting to harm anyone. Patient denies any current or past legal issues. Patient denies any history of SA use. Patient reports that he currently resides with a roommate although states his mother is his primary support who  transported him to [facility] after he contacted her this date voicing S/I. Patient states he was diagnosed with Schizophrenia over ten years ago and has been receiving OP services from [facility] who currently assists with medication management. Patient reports current medication compliance and feels his medications are working as indicated. Patient does report he suffers from sporadic bouts of depression with current symptoms to include: feeling hopeless and isolating. Patient states he is currently not wanting to change his medication regimen although voices interest in ECT reporting he had 4 sessions in 2019 that assisted with depressive symptoms. Patient was last seen per chart review in 2019 when he presented at that time with S/I after attempting to overdose. Patient reports that he lives with a roommate. Patient reports being single  and having no children. Patient states he is currently unemployed although is in the process of applying for disability. Patient is oriented to person, place, time and situation. Patient presented alert, dressed appropriately and groomed. Patient spoke clearly, coherently and did not seem to be under the influence of any substances or responding to any internal stimuli. Patient made good eye contact and answered questions appropriately. Patient presented with a pleasant affect and displayed no impairments of remote or recent memory. Case was staffed with Silvio Pate FNP who recommended a inpatient admission to assist with stabilization.   Diagnosis: F29.0 Schizophrenia   Past Medical History:  Past Medical History:  Diagnosis Date  . Depression   . Diabetes mellitus without complication ([place])   . Gallstones   . Obesity, Class III, BMI 40-49.9 (morbid obesity) ([place])     No past surgical history on file.  Family History:  Family History  Problem Relation Age of Onset  . Hypertension Mother   . Depression Mother   . Heart attack Maternal Grandfather   . Lymphoma Maternal Grandmother   . Diabetes Maternal Aunt   . Cancer Maternal Aunt     Social History:  reports that he has never smoked. He has never used smokeless tobacco. He reports current alcohol use. He reports that he does not use drugs.  Additional Social History:  Alcohol / Drug Use Pain Medications: See MAR Prescriptions: See MAR Over the Counter: See MAR History of alcohol / drug use?: No history of alcohol / drug abuse  CIWA:   COWS:    Allergies:  Allergies  Allergen Reactions  .  Fish Allergy Nausea Only  . Bee Venom Swelling  . Keflex [Cephalexin]     UPSET STOMACH  . Relafen [Nabumetone]     Blood in stool  . Shellfish Allergy Nausea Only    Home Medications: (Not in a hospital admission)   OB/GYN Status:  No LMP for male patient.  General Assessment Data Location of Assessment: [facility] Assessment  Services TTS Assessment: In system Is this a Tele or Face-to-Face Assessment?: Face-to-Face Is this an Initial Assessment or a Re-assessment for this encounter?: Initial Assessment Patient Accompanied by:: Parent Language Other than English: No Living Arrangements: Other (Comment) What gender do you identify as?: Male Marital status: Single Living Arrangements: Non-relatives/Friends Can pt return to current living arrangement?: Yes Admission Status: Voluntary Is patient capable of signing voluntary admission?: Yes Referral Source: Self/Family/Friend Insurance type: Medicaid  Medical Screening Exam ([place]) Medical Exam completed: Yes  Crisis Care Plan Living Arrangements: Non-relatives/Friends Legal Guardian: (NA) Name of Psychiatrist: [facility] Name of Therapist: [facility]  Education Status Is patient currently in school?: No Is the patient employed, unemployed or receiving disability?: Unemployed  Risk to self with the past 6 months Suicidal Ideation: Yes-Currently Present Has patient been a risk to self within the past 6 months prior to admission? : No Suicidal Intent: Yes-Currently Present Has patient had any suicidal intent within the past 6 months prior to admission? : No Is patient at risk for suicide?: Yes Suicidal Plan?: Yes-Currently Present Has patient had any suicidal plan within the past 6 months prior to admission? : No Specify Current Suicidal Plan: Cut his throat Access to Means: Yes Specify Access to Suicidal Means: Cut his throat What has been your use of drugs/alcohol within the last 12 months?: Denies Previous Attempts/Gestures: Yes How many times?: 2 Other Self Harm Risks: (NA) Triggers for Past Attempts: Unpredictable Intentional Self Injurious Behavior: None Family Suicide History: No Recent stressful life event(s): Other (Comment)(Family issues) Persecutory voices/beliefs?: No Depression: Yes Depression Symptoms: Feeling worthless/self  pity Substance abuse history and/or treatment for substance abuse?: No Suicide prevention information given to non-admitted patients: Not applicable  Risk to Others within the past 6 months Homicidal Ideation: No Does patient have any lifetime risk of violence toward others beyond the six months prior to admission? : No Thoughts of Harm to Others: No Current Homicidal Intent: No Current Homicidal Plan: No Access to Homicidal Means: No Identified Victim: NA History of harm to others?: No Assessment of Violence: None Noted Violent Behavior Description: NA Does patient have access to weapons?: No Criminal Charges Pending?: No Does patient have a court date: No Is patient on probation?: No  Psychosis Hallucinations: None noted Delusions: None noted  Mental Status Report Appearance/Hygiene: Unremarkable Eye Contact: Fair Motor Activity: Freedom of movement Speech: Logical/coherent Level of Consciousness: Alert Mood: Pleasant Affect: Appropriate to circumstance Anxiety Level: Minimal Thought Processes: Coherent, Relevant Judgement: Partial Orientation: Person, Place, Time Obsessive Compulsive Thoughts/Behaviors: None  Cognitive Functioning Concentration: Normal Memory: Recent Intact, Remote Intact Is patient IDD: No Insight: Fair Impulse Control: Poor Appetite: Good Have you had any weight changes? : No Change Sleep: No Change Total Hours of Sleep: 6 Vegetative Symptoms: None  ADLScreening [facility] Assessment Services) Patient's cognitive ability adequate to safely complete daily activities?: Yes Patient able to express need for assistance with ADLs?: Yes Independently performs ADLs?: Yes (appropriate for developmental age)  Prior Inpatient Therapy Prior Inpatient Therapy: Yes Prior Therapy Dates: 2019 Prior Therapy Facilty/Provider(s): [facility] Reason for Treatment: MH issues  Prior  Outpatient Therapy Prior Outpatient Therapy: Yes Prior Therapy Dates: Ongoing Prior  Therapy Facilty/Provider(s): [facility] Reason for Treatment: Med mang Does patient have an ACCT team?: No Does patient have Intensive In-House Services?  : No Does patient have [facility] services? : Yes Does patient have P[facility] services?: No  ADL Screening (condition at time of admission) Patient's cognitive ability adequate to safely complete daily activities?: Yes Is the patient deaf or have difficulty hearing?: No Does the patient have difficulty seeing, even when wearing glasses/contacts?: No Does the patient have difficulty concentrating, remembering, or making decisions?: No Patient able to express need for assistance with ADLs?: Yes Does the patient have difficulty dressing or bathing?: No Independently performs ADLs?: Yes (appropriate for developmental age) Does the patient have difficulty walking or climbing stairs?: No Weakness of Legs: None Weakness of Arms/Hands: None  Home Assistive Devices/Equipment Home Assistive Devices/Equipment: None  Therapy Consults (therapy consults require a physician order) PT Evaluation Needed: No OT Evalulation Needed: No SLP Evaluation Needed: No Abuse/Neglect Assessment (Assessment to be complete while patient is alone) Physical Abuse: Denies Verbal Abuse: Denies Sexual Abuse: Denies Exploitation of patient/patient's resources: Denies Self-Neglect: Denies Values / Beliefs Cultural Requests During Hospitalization: None Spiritual Requests During Hospitalization: None Consults Spiritual Care Consult Needed: No Social Work Consult Needed: No Regulatory affairs officer (For Healthcare) Does Patient Have a Medical Advance Directive?: No Would patient like information on creating a medical advance directive?: No - Patient declined          Disposition: Case was staffed with Silvio Pate FNP who recommended a inpatient admission to assist with stabilization.    Disposition Initial Assessment Completed for this Encounter: Yes Disposition of Patient:  Admit Type of inpatient treatment program: Adult Patient refused recommended treatment: No Mode of transportation if patient is discharged/movement?: Car  On Site Evaluation by:   Reviewed with Physician:    Mamie Nick 01/07/2019 5:14 PM

## 2019-01-07 NOTE — Tx Team (Addendum)
Initial Treatment Plan 01/07/2019 11:14 PM [name] [id number]    PATIENT STRESSORS: Financial difficulties   PATIENT STRENGTHS: Ability for insight Active sense of humor   PATIENT IDENTIFIED PROBLEMS: Depression  SI      " Not to feel worthless"             DISCHARGE CRITERIA:  Improved stabilization in mood, thinking, and/or behavior  PRELIMINARY DISCHARGE PLAN: Return to previous living arrangement  PATIENT/FAMILY INVOLVEMENT: This treatment plan has been presented to and reviewed with the patient, Marc Long, and/or family member.  The patient and family have been given the opportunity to ask questions and make suggestions.  Aurora Mask, RN 01/07/2019, 11:14 PM
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
## 2019-01-07 NOTE — BH Assessment (Signed)
[place] Assessment Progress Note Case was staffed with Silvio Pate FNP who recommended a inpatient admission to assist with stabilization.

## 2019-01-08 DIAGNOSIS — F332 Major depressive disorder, recurrent severe without psychotic features: Secondary | ICD-10-CM

## 2019-01-08 LAB — GLUCOSE, CAPILLARY
Glucose-Capillary: 94 mg/dL (ref 70–99)
Glucose-Capillary: 110 mg/dL — ABNORMAL HIGH (ref 70–99)

## 2019-01-08 MED ORDER — FLUVOXAMINE MALEATE 100 MG PO TABS
100.0000 mg | ORAL_TABLET | Freq: Two times a day (BID) | ORAL | Status: DC
  Administered 2019-01-08 – 2019-01-12 (×9): 100 mg via ORAL
  Filled 2019-01-08 (×12): qty 1

## 2019-01-08 MED ORDER — TRAZODONE HCL 50 MG PO TABS
50.0000 mg | ORAL_TABLET | Freq: Every evening | ORAL | Status: DC | PRN
  Administered 2019-01-08 – 2019-01-13 (×6): 50 mg via ORAL
  Filled 2019-01-08 (×3): qty 1

## 2019-01-08 MED ORDER — LORAZEPAM 0.5 MG PO TABS: 0.5000 mg | ORAL_TABLET | Freq: Four times a day (QID) | ORAL | Status: DC | PRN

## 2019-01-08 MED ORDER — BUSPIRONE HCL 15 MG PO TABS
15.0000 mg | ORAL_TABLET | Freq: Two times a day (BID) | ORAL | Status: DC
  Administered 2019-01-08 – 2019-01-14 (×12): 15 mg via ORAL
  Filled 2019-01-08 (×16): qty 1

## 2019-01-08 MED ORDER — QUETIAPINE FUMARATE 50 MG PO TABS
150.0000 mg | ORAL_TABLET | Freq: Every day | ORAL | Status: DC
  Administered 2019-01-08 – 2019-01-09 (×2): 150 mg via ORAL
  Filled 2019-01-08 (×3): qty 1

## 2019-01-08 MED ORDER — LISINOPRIL 20 MG PO TABS
20.0000 mg | ORAL_TABLET | Freq: Every day | ORAL | Status: DC
  Administered 2019-01-08 – 2019-01-14 (×7): 20 mg via ORAL
  Filled 2019-01-08 (×9): qty 1

## 2019-01-08 MED ORDER — METFORMIN HCL 500 MG PO TABS
1000.0000 mg | ORAL_TABLET | Freq: Two times a day (BID) | ORAL | Status: DC
  Administered 2019-01-08 – 2019-01-14 (×12): 1000 mg via ORAL
  Filled 2019-01-08 (×16): qty 2

## 2019-01-08 NOTE — BHH Suicide Risk Assessment (Signed)
[facility] Admission Suicide Risk Assessment   Nursing information obtained from:  Patient Demographic factors:  Male, Unemployed Current Mental Status:  Suicidal ideation indicated by patient Loss Factors:  Financial problems / change in socioeconomic status Historical Factors:  NA Risk Reduction Factors:  Positive social support  Total Time spent with patient: 45 minutes Principal Problem:  Depression  Diagnosis:  Active Problems:   Schizophrenia ([place])  Subjective Data:   Continued Clinical Symptoms:  Alcohol Use Disorder Identification Test Final Score (AUDIT): 1 The "Alcohol Use Disorders Identification Test", Guidelines for Use in Primary Care, Second Edition.  World Pharmacologist [facility]). Score between 0-7:  no or low risk or alcohol related problems. Score between 8-15:  moderate risk of alcohol related problems. Score between 16-19:  high risk of alcohol related problems. Score 20 or above:  warrants further diagnostic evaluation for alcohol dependence and treatment.   CLINICAL FACTORS:  43 year old male, history of depression, history of intermittent visual imagery of him committing suicide.  Presented due to worsening depression/sadness and suicidal thoughts of cutting his throat.  No psychotic symptoms.  He also reports he has sexual fantasies that caused him to feel guilty and depressed.  Of note denies any actual plan or intention of acting out on any of these thoughts and today contracts for safety.  He is currently on a combination of BuSpar, Luvox, Seroquel which she states has helped and has been well-tolerated.    Psychiatric Specialty Exam: Physical Exam  ROS  Blood pressure (!) 129/94, pulse 99, temperature 98.4 F (36.9 C), temperature source Oral, resp. rate 16, SpO2 100 %.There is no height or weight on file to calculate BMI.  See admit note MSE   COGNITIVE FEATURES THAT CONTRIBUTE TO RISK:  Closed-mindedness and Loss of executive function    SUICIDE  RISK:   Moderate:  Frequent suicidal ideation with limited intensity, and duration, some specificity in terms of plans, no associated intent, good self-control, limited dysphoria/symptomatology, some risk factors present, and identifiable protective factors, including available and accessible social support.  PLAN OF CARE: Patient will be admitted to inpatient psychiatric unit for stabilization and safety. Will provide and encourage milieu participation. Provide medication management and maked adjustments as needed.  Will follow daily.    I certify that inpatient services furnished can reasonably be expected to improve the patient's condition.   Jenne Campus, MD 01/08/2019, 1:12 PM

## 2019-01-08 NOTE — BHH Group Notes (Signed)
  [facility] LCSW Group Therapy Note  Date/Time: 01/08/2019 @ 10am  Type of Therapy/Topic:  Group Therapy:  Emotion Regulation  Participation Level:  Active   Mood: Pleasant  Description of Group:    The purpose of this group is to assist patients in learning to regulate negative emotions and experience positive emotions. Patients will be guided to discuss ways in which they have been vulnerable to their negative emotions. These vulnerabilities will be juxtaposed with experiences of positive emotions or situations, and patients challenged to use positive emotions to combat negative ones. Special emphasis will be placed on coping with negative emotions in conflict situations, and patients will process healthy conflict resolution skills.  Therapeutic Goals: 1. Patient will identify two positive emotions or experiences to reflect on in order to balance out negative emotions:  2. Patient will label two or more emotions that they find the most difficult to experience:  3. Patient will be able to demonstrate positive conflict resolution skills through discussion or role plays:   Summary of Patient Progress:   Pt was engaged and active throughout group. Pt was able to identify two positive emotions that he uses in order to balance out negative emotions. Pt stated that music helps him balance out negative emotions. Pt reports that if he is upset with negative emotions that he will start to listen to music that matches his current mood and then slowly work his way up to upbeat music. Pt stated that the emotions that he finds the mind difficult to experience are: anger, fear, and worthlessness. Pt states that he struggles to feel most emotions because of his medications but he states that the beach also is a way to balance out his negative emotions.     Therapeutic Modalities:   Cognitive Behavioral Therapy Feelings Identification Dialectical Behavioral Therapy   Ardelle Anton, LCSW

## 2019-01-08 NOTE — Progress Notes (Signed)
[place] NOVEL CORONAVIRUS (COVID-19) DAILY CHECK-OFF SYMPTOMS - answer yes or no to each - every day NO YES  Have you had a fever in the past 24 hours?  . Fever (Temp > 37.80C / 100F) X   Have you had any of these symptoms in the past 24 hours? . New Cough .  Sore Throat  .  Shortness of Breath .  Difficulty Breathing .  Unexplained Body Aches   X   Have you had any one of these symptoms in the past 24 hours not related to allergies?   . Runny Nose .  Nasal Congestion .  Sneezing   X   If you have had runny nose, nasal congestion, sneezing in the past 24 hours, has it worsened?  X   EXPOSURES - check yes or no X   Have you traveled outside the state in the past 14 days?  X   Have you been in contact with someone with a confirmed diagnosis of COVID-19 or PUI in the past 14 days without wearing appropriate PPE?  X   Have you been living in the same home as a person with confirmed diagnosis of COVID-19 or a PUI (household contact)?    X   Have you been diagnosed with COVID-19?    X              What to do next: Answered NO to all: Answered YES to anything:   Proceed with unit schedule Follow the [facility] Inpatient Flowsheet.   

## 2019-01-08 NOTE — Progress Notes (Signed)
D. Pt is friendly and pleasant upon approach- observed interacting appropriately in the milieu. Per pt's self inventory, pt rated his depression, hopelessness and anxiety a 9/10/7, respectively. Pt writes that his goal today is "just to adjust to everyone" Pt currently denies SI/HI and AVH and agrees to contact staff before acting on any harmful thoughts.  A. Labs and vitals monitored.Pt supported emotionally and encouraged to express concerns and ask questions.   R. Pt remains safe with 15 minute checks. Will continue POC.

## 2019-01-08 NOTE — H&P (Signed)
Psychiatric Admission Assessment Adult  Patient Identification: Marc Long MRN:  [id number] Date of Evaluation:  01/08/2019 Chief Complaint:  " I felt I could not find a reason to live anymore" Principal Diagnosis: MDD versus Bipolar Disorder Depressed Diagnosis:  MDD versus Bipolar Disorder Depressed History of Present Illness:43 year old male, known to our unit from prior admissions in 2019. He states he has had increased depression/sadness recently, particularly over the past few weeks. Does not identify any specific triggers or stressors. Reports " it's like I don't see the point of life anymore". He has had thoughts of slitting his throat with a knife . Of note , he reports history of intermittent visual imagery ( not hallucinations) of him committing suicide, but states this has improved significantly with medication management he is on .  Endorses some neuro-vegetative symptoms as below. Denies psychotic symptoms.  He also reports that he struggles with a sense of low self esteem , depression due to having intermittent sexual fantasies of rape, which " makes me feel like I am a bad person". He states he does not act out on these thoughts, and denies any plan or intention of actually hurting anyone. Associated Signs/Symptoms: Depression Symptoms:  depressed mood, anhedonia, hypersomnia, suicidal thoughts with specific plan, loss of energy/fatigue, (Hypo) Manic Symptoms:  Reports some increased irritability recently- no manic symptoms noted  Anxiety Symptoms: denies increased anxiety Psychotic Symptoms:  Does not endorse  PTSD Symptoms: Denies Total Time spent with patient: 45 minutes  Past Psychiatric History: history of three prior psychiatric admissions , starting in June 2019 for depression, SI, neglecting diabetic care , and  most recently in October 2019. History of prior suicide attempts by overdose . No history of self cutting or self injurious behaviors.  At the time was  admitted for depression, suicide attempt by overdose. At the time was discharged on Luvox, Seroquel, Trazodone, Buspar. Denies history of psychosis. States he has been diagnosed with MDD and with Bipolar Disorder in the past . He reports brief episodes of increased energy, decreased need for sleep.  Patient reports history of intermittent visual images of " me killing myself ". States these have been chronic but have improved with current medication regimen.   Is the patient at risk to self? Yes.    Has the patient been a risk to self in the past 6 months? Yes.    Has the patient been a risk to self within the distant past? Yes.    Is the patient a risk to others? No.  Has the patient been a risk to others in the past 6 months? No.  Has the patient been a risk to others within the distant past? No.   Prior Inpatient Therapy: Prior Inpatient Therapy: Yes Prior Therapy Dates: 2019 Prior Therapy Facilty/Provider(s): [facility] Reason for Treatment: MH issues Prior Outpatient Therapy: Prior Outpatient Therapy: Yes Prior Therapy Dates: Ongoing Prior Therapy Facilty/Provider(s): [facility] Reason for Treatment: Med mang Does patient have an ACCT team?: No Does patient have Intensive In-House Services?  : No Does patient have [facility] services? : Yes Does patient have [facility] services?: No  Alcohol Screening: 1. How often do you have a drink containing alcohol?: Monthly or less 2. How many drinks containing alcohol do you have on a typical day when you are drinking?: 1 or 2 3. How often do you have six or more drinks on one occasion?: Never AUDIT-C Score: 1 4. How often during the last year have you found that  you were not able to stop drinking once you had started?: Never 5. How often during the last year have you failed to do what was normally expected from you becasue of drinking?: Never 6. How often during the last year have you needed a first drink in the morning to get yourself going after a heavy  drinking session?: Never 7. How often during the last year have you had a feeling of guilt of remorse after drinking?: Never 8. How often during the last year have you been unable to remember what happened the night before because you had been drinking?: Never 9. Have you or someone else been injured as a result of your drinking?: No 10. Has a relative or friend or a doctor or another health worker been concerned about your drinking or suggested you cut down?: No Alcohol Use Disorder Identification Test Final Score (AUDIT): 1 Substance Abuse History in the last 12 months:  Denies alcohol or drug abuse  Consequences of Substance Abuse: Does not endorse  Previous Psychotropic Medications: Luvox 100 mgrs BID, Seroquel 150 mgrs QHS, Buspar 15 mgrs TID. States he has been on these medications for several months.  Of note, patient reports that ECT course in 2019 ( had #4) was helpful.  Psychological Evaluations:  No  Past Medical History: DM, HTN. Takes Metformin 1000 mgr BID , Lisinopril 20 mgrs QDAY  Past Medical History:  Diagnosis Date  . Depression   . Diabetes mellitus without complication ([place])   . Gallstones   . Obesity, Class III, BMI 40-49.9 (morbid obesity) ([place])    History reviewed. No pertinent surgical history. Family History: states he has not contact or knowledge of biological father, has good relationship with mother, has one younger brother Family History  Problem Relation Age of Onset  . Hypertension Mother   . Depression Mother   . Heart attack Maternal Grandfather   . Lymphoma Maternal Grandmother   . Diabetes Maternal Aunt   . Cancer Maternal Aunt    Family Psychiatric  History: mother has history of depression, no suicides in family. Maternal grandfather has history of alcohol abuse . Tobacco Screening:  Does not smoke or vape Social History: 77, single, no children, lives with roommate, unemployed , denies legal issues. Social History   Substance and Sexual  Activity  Alcohol Use Yes  . Alcohol/week: 0.0 standard drinks   Comment: once every 2 weeks. Wine coolers     Social History   Substance and Sexual Activity  Drug Use No    Additional Social History: Marital status: Single    Pain Medications: See MAR Prescriptions: See MAR Over the Counter: See MAR History of alcohol / drug use?: No history of alcohol / drug abuse  Allergies:   Allergies  Allergen Reactions  . Fish Allergy Nausea Only  . Bee Venom Swelling  . Keflex [Cephalexin]     UPSET STOMACH  . Relafen [Nabumetone]     Blood in stool  . Shellfish Allergy Nausea Only   Lab Results:  Results for orders placed or performed during the hospital encounter of 01/07/19 (from the past 48 hour(s))  SARS Coronavirus 2 (CEPHEID - Performed in [facility] hospital lab), Hosp Order     Status: None   Collection Time: 01/07/19  5:00 PM   Specimen: Nasopharyngeal Swab  Result Value Ref Range   SARS Coronavirus 2 NEGATIVE NEGATIVE    Comment: (NOTE) If result is NEGATIVE SARS-CoV-2 target nucleic acids are NOT DETECTED. The SARS-CoV-2 RNA  is generally detectable in upper and lower  respiratory specimens during the acute phase of infection. The lowest  concentration of SARS-CoV-2 viral copies this assay can detect is 250  copies / mL. A negative result does not preclude SARS-CoV-2 infection  and should not be used as the sole basis for treatment or other  patient management decisions.  A negative result may occur with  improper specimen collection / handling, submission of specimen other  than nasopharyngeal swab, presence of viral mutation(s) within the  areas targeted by this assay, and inadequate number of viral copies  (<250 copies / mL). A negative result must be combined with clinical  observations, patient history, and epidemiological information. If result is POSITIVE SARS-CoV-2 target nucleic acids are DETECTED. The SARS-CoV-2 RNA is generally detectable in upper and  lower  respiratory specimens dur ing the acute phase of infection.  Positive  results are indicative of active infection with SARS-CoV-2.  Clinical  correlation with patient history and other diagnostic information is  necessary to determine patient infection status.  Positive results do  not rule out bacterial infection or co-infection with other viruses. If result is PRESUMPTIVE POSTIVE SARS-CoV-2 nucleic acids MAY BE PRESENT.   A presumptive positive result was obtained on the submitted specimen  and confirmed on repeat testing.  While 2019 novel coronavirus  (SARS-CoV-2) nucleic acids may be present in the submitted sample  additional confirmatory testing may be necessary for epidemiological  and / or clinical management purposes  to differentiate between  SARS-CoV-2 and other Sarbecovirus currently known to infect humans.  If clinically indicated additional testing with an alternate test  methodology [phone]) is advised. The SARS-CoV-2 RNA is generally  detectable in upper and lower respiratory sp ecimens during the acute  phase of infection. The expected result is Negative. Fact Sheet for Patients:  StrictlyIdeas.no Fact Sheet for Healthcare Providers: BankingDealers.co.za This test is not yet approved or cleared by the [place] FDA and has been authorized for detection and/or diagnosis of SARS-CoV-2 by FDA under an Emergency Use Authorization (EUA).  This EUA will remain in effect (meaning this test can be used) for the duration of the COVID-19 declaration under Section 564(b)(1) of the Act, 21 U.S.C. section 360bbb-3(b)(1), unless the authorization is terminated or revoked sooner. Performed at [facility], [place] [address] [place], [place] [address]   Glucose, capillary     Status: Abnormal   Collection Time: 01/07/19 10:31 PM  Result Value Ref Range   Glucose-Capillary 122 (H) 70 - 99 mg/dL   Comment  1 Notify RN    Comment 2 Document in Chart   Glucose, capillary     Status: None   Collection Time: 01/08/19  6:25 AM  Result Value Ref Range   Glucose-Capillary 94 70 - 99 mg/dL    Blood Alcohol level:  Lab Results  Component Value Date   ETH <10 04/06/2018   ETH <10 71/11/2692    Metabolic Disorder Labs:  Lab Results  Component Value Date   HGBA1C 5.6 04/07/2018   MPG 114.02 04/07/2018   MPG 432.59 12/09/2017   Lab Results  Component Value Date   PROLACTIN 16.1 (H) 04/07/2018   Lab Results  Component Value Date   CHOL 172 04/07/2018   TRIG 186 (H) 04/07/2018   HDL 31 (L) 04/07/2018   CHOLHDL 5.5 04/07/2018   VLDL 37 04/07/2018   LDLCALC 104 (H) 04/07/2018   LDLCALC 85 03/05/2017    Current Medications: Current Facility-Administered  Medications  Medication Dose Route Frequency Provider Last Rate Last Dose  . acetaminophen (TYLENOL) tablet 650 mg  650 mg Oral Q6H PRN Laverle Hobby, PA-C      . alum & mag hydroxide-simeth (MAALOX/MYLANTA) 200-200-20 MG/5ML suspension 30 mL  30 mL Oral Q4H PRN Patriciaann Clan E, PA-C      . hydrOXYzine (ATARAX/VISTARIL) tablet 25 mg  25 mg Oral Q6H PRN Patriciaann Clan E, PA-C   25 mg at 01/07/19 2239  . OLANZapine zydis (ZYPREXA) disintegrating tablet 10 mg  10 mg Oral Q8H PRN Laverle Hobby, PA-C       And  . LORazepam (ATIVAN) tablet 1 mg  1 mg Oral PRN Patriciaann Clan E, PA-C       And  . ziprasidone (GEODON) injection 20 mg  20 mg Intramuscular PRN Patriciaann Clan E, PA-C      . magnesium hydroxide (MILK OF MAGNESIA) suspension 30 mL  30 mL Oral Daily PRN Laverle Hobby, PA-C      . traZODone (DESYREL) tablet 50 mg  50 mg Oral QHS,MR X 1 Laverle Hobby, PA-C   50 mg at 01/07/19 2239   PTA Medications: Medications Prior to Admission  Medication Sig Dispense Refill Last Dose  . busPIRone (BUSPAR) 15 MG tablet Take 15 mg by mouth 3 (three) times daily.     . fluvoxaMINE (LUVOX) 100 MG tablet Take 1 tablet (100 mg total) by  mouth 2 (two) times daily. For depression 60 tablet 0   . hydrOXYzine (ATARAX/VISTARIL) 50 MG tablet Take 1 tablet (50 mg total) by mouth every 6 (six) hours as needed for anxiety. 60 tablet 0   . lisinopril (ZESTRIL) 20 MG tablet Take 20 mg by mouth daily.      . metFORMIN (GLUCOPHAGE) 1000 MG tablet Take 1 tablet (1,000 mg total) by mouth 2 (two) times daily with a meal. For diabetes management 60 tablet 0   . pantoprazole (PROTONIX) 40 MG tablet Take 1 tablet (40 mg total) by mouth daily. For acid reflux 15 tablet 0   . QUEtiapine (SEROQUEL) 50 MG tablet Take 3 tablets (150 mg total) by mouth at bedtime. For mood control 90 tablet 0   . traZODone (DESYREL) 50 MG tablet Take 1 tablet (50 mg total) by mouth at bedtime as needed for sleep. 30 tablet 0     Musculoskeletal: Strength & Muscle Tone: within normal limits Gait & Station: normal Patient leans: Backward  Psychiatric Specialty Exam: Physical Exam  Review of Systems  Constitutional: Negative.  Negative for chills and fever.  HENT: Negative.   Eyes: Negative.   Respiratory: Negative.  Negative for cough and shortness of breath.   Cardiovascular: Negative.  Negative for chest pain.  Gastrointestinal: Negative for diarrhea, nausea and vomiting.  Genitourinary: Negative.   Musculoskeletal: Negative.   Skin: Negative.  Negative for rash.  Neurological: Negative for seizures and headaches.  Psychiatric/Behavioral: Positive for depression and suicidal ideas.    Blood pressure (!) 129/94, pulse 99, temperature 98.4 F (36.9 C), temperature source Oral, resp. rate 16, SpO2 100 %.There is no height or weight on file to calculate BMI.  General Appearance: Well Groomed  Eye Contact:  Good  Speech:  Normal Rate  Volume:  Normal  Mood:  Anxious and Depressed  Affect:  Congruent  Thought Process:  Linear and Descriptions of Associations: Intact  Orientation:  Other:  fully alert and attentive   Thought Content:  no hallucinations, no  delusions  Suicidal Thoughts:  No denies any current suicidal or self injurious ideations, denies current homicidal or violent ideations, and denies any current plan or intention of acting out in a violent manner towards anyone, contracts for safety on unit .  Homicidal Thoughts:  No  Memory:  recent and remote grossly intact   Judgement:  Fair  Insight:  Fair  Psychomotor Activity:  Normal does not appear restless or in any acute distress   Concentration:  Concentration: Good and Attention Span: Good  Recall:  Good  Fund of Knowledge:  Good  Language:  Good  Akathisia:  Negative  Handed:  Right  AIMS (if indicated):     Assets:  Communication Skills Desire for Improvement Resilience  ADL's:  Intact  Cognition:  WNL  Sleep:       Treatment Plan Summary: Daily contact with patient to assess and evaluate symptoms and progress in treatment, Medication management, Plan inpatient treatment and medications as below  Observation Level/Precautions:  15 minute checks  Laboratory:  CBC, CMP, TSH, HgbA1C, Lipid Panel, UDS  Psychotherapy:  Milieu, group therapy   Medications:  We discussed options- he states that medications he is currently on ( Luvox, Seroquel, Buspar  ) have been partially effective . For now, will continue current regimen Seroquel 150 mgrs QHS Luvox 100 mgrs BID Buspar 15 mgrs TID Continue GLucophage 1000 mgrs BID for DM and Lisinopril 20 mgrs QDAY for HTN Agitation Protocol as needed for acute agitation  Consultations:  As needed  Discharge Concerns: -    Estimated LOS: 4-5 days   Other:     Physician Treatment Plan for Primary Diagnosis:  MDD Long Term Goal(s): Improvement in symptoms so as ready for discharge  Short Term Goals: Ability to identify changes in lifestyle to reduce recurrence of condition will improve, Ability to verbalize feelings will improve, Ability to disclose and discuss suicidal ideas, Ability to demonstrate self-control will improve, Ability  to identify and develop effective coping behaviors will improve, Ability to maintain clinical measurements within normal limits will improve and Compliance with prescribed medications will improve  Physician Treatment Plan for Secondary Diagnosis: Suicidal Ideations Long Term Goal(s): Improvement in symptoms so as ready for discharge  Short Term Goals: Ability to identify changes in lifestyle to reduce recurrence of condition will improve, Ability to verbalize feelings will improve, Ability to disclose and discuss suicidal ideas, Ability to demonstrate self-control will improve, Ability to identify and develop effective coping behaviors will improve and Ability to maintain clinical measurements within normal limits will improve  I certify that inpatient services furnished can reasonably be expected to improve the patient's condition.    Jenne Campus, MD 7/25/202012:26 PM
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
## 2019-01-08 NOTE — Progress Notes (Signed)
D: Pt passive SI-contracts for safety. denies HI/AVH. Pt is pleasant and cooperative. Pt  A: Pt was offered support and encouragement. Pt was given scheduled medications. Pt was encourage to attend groups. Q 15 minute checks were done for safety.  R:Pt attends groups and interacts well with peers and staff. Pt is taking medication. Pt has no complaints.Pt receptive to treatment and safety maintained on unit.

## 2019-01-09 DIAGNOSIS — F209 Schizophrenia, unspecified: Principal | ICD-10-CM

## 2019-01-09 DIAGNOSIS — R45851 Suicidal ideations: Secondary | ICD-10-CM

## 2019-01-09 LAB — URINALYSIS, COMPLETE (UACMP) WITH MICROSCOPIC
Bacteria, UA: NONE SEEN
Bilirubin Urine: NEGATIVE
Glucose, UA: NEGATIVE mg/dL
Hgb urine dipstick: NEGATIVE
Ketones, ur: NEGATIVE mg/dL
Leukocytes,Ua: NEGATIVE
Nitrite: NEGATIVE
Protein, ur: NEGATIVE mg/dL
Specific Gravity, Urine: 1.024 (ref 1.005–1.030)
WBC, UA: >50 WBC/hpf — ABNORMAL HIGH (ref 0–5)
pH: 5 (ref 5.0–8.0)

## 2019-01-09 LAB — RAPID URINE DRUG SCREEN, HOSP PERFORMED
Amphetamines: NOT DETECTED
Barbiturates: NOT DETECTED
Benzodiazepines: NOT DETECTED
Cocaine: NOT DETECTED
Opiates: NOT DETECTED
Tetrahydrocannabinol: NOT DETECTED

## 2019-01-09 LAB — GLUCOSE, CAPILLARY
Glucose-Capillary: 109 mg/dL — ABNORMAL HIGH (ref 70–99)
Glucose-Capillary: 96 mg/dL (ref 70–99)
Glucose-Capillary: 167 mg/dL — ABNORMAL HIGH (ref 70–99)

## 2019-01-09 LAB — LIPID PANEL
Cholesterol: 170 mg/dL (ref 0–200)
HDL: 28 mg/dL — ABNORMAL LOW (ref >40)
LDL Cholesterol: 106 mg/dL — ABNORMAL HIGH (ref 0–99)
Total CHOL/HDL Ratio: 6.1 RATIO
Triglycerides: 179 mg/dL — ABNORMAL HIGH (ref <150)
VLDL: 36 mg/dL (ref 0–40)

## 2019-01-09 LAB — HEMOGLOBIN A1C
Hgb A1c MFr Bld: 6.6 % — ABNORMAL HIGH (ref 4.8–5.6)
Mean Plasma Glucose: 142.72 mg/dL

## 2019-01-09 MED ORDER — BUPROPION HCL ER (XL) 150 MG PO TB24
150.0000 mg | ORAL_TABLET | Freq: Every day | ORAL | Status: DC
  Administered 2019-01-09 – 2019-01-12 (×4): 150 mg via ORAL
  Filled 2019-01-09 (×6): qty 1

## 2019-01-09 NOTE — Progress Notes (Signed)
   01/09/19 0756  COVID-19 Daily Checkoff  Have you had a fever (temp > 37.80C/100F)  in the past 24 hours?  No  If you have had runny nose, nasal congestion, sneezing in the past 24 hours, has it worsened? No  COVID-19 EXPOSURE  Have you traveled outside the state in the past 14 days? No  Have you been in contact with someone with a confirmed diagnosis of COVID-19 or PUI in the past 14 days without wearing appropriate PPE? No  Have you been living in the same home as a person with confirmed diagnosis of COVID-19 or a PUI (household contact)? No  Have you been diagnosed with COVID-19? No
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
## 2019-01-09 NOTE — Progress Notes (Signed)
D: Patient presents sad, flat. Patient denies SI/HI/AVH. He has not filled out a self-inventory despite receiving one. He is pleasant, polite, and cooperative.  A: Patient checked q15 min, and checks reviewed. Reviewed medication changes with patient and educated on side effects. Educated patient on importance of attending group therapy sessions and educated on several coping skills. Encouarged participation in milieu through recreation therapy and attending meals with peers. Support and encouragement provided. Fluids offered. R: Patient receptive to education on medications, and is medication compliant. Patient contracts for safety on the unit.

## 2019-01-09 NOTE — Progress Notes (Signed)
Patient has been observed up in the dayroom watching videos and interacting with peers. He reports that he has had a good day today. Writer inquired about his blood sugar being a little higher tonight and he reports its probable due to him taking his metformin after his meal. He is pleasant and cooperative on the unit. Safety maintained on unit with 15 min checks.

## 2019-01-10 DIAGNOSIS — F200 Paranoid schizophrenia: Secondary | ICD-10-CM

## 2019-01-10 LAB — GLUCOSE, CAPILLARY
Glucose-Capillary: 106 mg/dL — ABNORMAL HIGH (ref 70–99)
Glucose-Capillary: 122 mg/dL — ABNORMAL HIGH (ref 70–99)

## 2019-01-10 MED ORDER — QUETIAPINE FUMARATE 200 MG PO TABS
200.0000 mg | ORAL_TABLET | Freq: Every day | ORAL | Status: DC
  Administered 2019-01-10 – 2019-01-13 (×4): 200 mg via ORAL
  Filled 2019-01-10 (×6): qty 1

## 2019-01-10 NOTE — Progress Notes (Signed)
[facility] MD Progress Note  01/10/2019 1:23 PM Marc Long  MRN:  [id number] Subjective:   Patient seen he is in bed makes minimal eye contact alert and oriented stating he still depressed with negative thoughts but does not elaborate too much beyond what is already told other examiners.  Understands what it is to contract for safety here and denies hallucinations Principal Problem: Reporting depressive symptoms Diagnosis: Active Problems:   Schizophrenia ([place])  Total Time spent with patient: 20 minutes  Past Psychiatric History: Extensive  Past Medical History:  Past Medical History:  Diagnosis Date  . Depression   . Diabetes mellitus without complication ([place])   . Gallstones   . Obesity, Class III, BMI 40-49.9 (morbid obesity) ([place])    History reviewed. No pertinent surgical history. Family History:  Family History  Problem Relation Age of Onset  . Hypertension Mother   . Depression Mother   . Heart attack Maternal Grandfather   . Lymphoma Maternal Grandmother   . Diabetes Maternal Aunt   . Cancer Maternal Aunt    Family Psychiatric  History: ext Social History:  Social History   Substance and Sexual Activity  Alcohol Use Yes  . Alcohol/week: 0.0 standard drinks   Comment: once every 2 weeks. Wine coolers     Social History   Substance and Sexual Activity  Drug Use No    Social History   Socioeconomic History  . Marital status: Single    Spouse name: Not on file  . Number of children: 0  . Years of education: Not on file  . Highest education level: Not on file  Occupational History  . Occupation: call center  Social Needs  . Financial resource strain: Not on file  . Food insecurity    Worry: Not on file    Inability: Not on file  . Transportation needs    Medical: Not on file    Non-medical: Not on file  Tobacco Use  . Smoking status: Never Smoker  . Smokeless tobacco: Never Used  Substance and Sexual Activity  . Alcohol use: Yes     Alcohol/week: 0.0 standard drinks    Comment: once every 2 weeks. Wine coolers  . Drug use: No  . Sexual activity: Not Currently    Birth control/protection: None  Lifestyle  . Physical activity    Days per week: Not on file    Minutes per session: Not on file  . Stress: Not on file  Relationships  . Social Herbalist on phone: Not on file    Gets together: Not on file    Attends religious service: Not on file    Active member of club or organization: Not on file    Attends meetings of clubs or organizations: Not on file    Relationship status: Not on file  Other Topics Concern  . Not on file  Social History Narrative  . Not on file   Additional Social History:    Pain Medications: See MAR Prescriptions: See MAR Over the Counter: See MAR History of alcohol / drug use?: No history of alcohol / drug abuse                    Sleep: Good  Appetite:  Good  Current Medications: Current Facility-Administered Medications  Medication Dose Route Frequency Provider Last Rate Last Dose  . acetaminophen (TYLENOL) tablet 650 mg  650 mg Oral Q6H PRN Laverle Hobby, PA-C      .  alum & mag hydroxide-simeth (MAALOX/MYLANTA) 200-200-20 MG/5ML suspension 30 mL  30 mL Oral Q4H PRN Patriciaann Clan E, PA-C      . buPROPion (WELLBUTRIN XL) 24 hr tablet 150 mg  150 mg Oral Daily Connye Burkitt, NP   150 mg at 01/10/19 0753  . busPIRone (BUSPAR) tablet 15 mg  15 mg Oral BID Cobos, Myer Peer, MD   15 mg at 01/10/19 0753  . fluvoxaMINE (LUVOX) tablet 100 mg  100 mg Oral BID Cobos, Myer Peer, MD   100 mg at 01/10/19 0753  . lisinopril (ZESTRIL) tablet 20 mg  20 mg Oral Daily Cobos, Myer Peer, MD   20 mg at 01/10/19 0753  . OLANZapine zydis (ZYPREXA) disintegrating tablet 10 mg  10 mg Oral Q8H PRN Laverle Hobby, PA-C       And  . LORazepam (ATIVAN) tablet 1 mg  1 mg Oral PRN Patriciaann Clan E, PA-C       And  . ziprasidone (GEODON) injection 20 mg  20 mg Intramuscular PRN  Patriciaann Clan E, PA-C      . magnesium hydroxide (MILK OF MAGNESIA) suspension 30 mL  30 mL Oral Daily PRN Laverle Hobby, PA-C      . metFORMIN (GLUCOPHAGE) tablet 1,000 mg  1,000 mg Oral BID WC Cobos, Myer Peer, MD   1,000 mg at 01/10/19 0752  . QUEtiapine (SEROQUEL) tablet 200 mg  200 mg Oral QHS Johnn Hai, MD      . traZODone (DESYREL) tablet 50 mg  50 mg Oral QHS PRN,MR X 1 Laverle Hobby, PA-C   50 mg at 01/09/19 2113    Lab Results:  Results for orders placed or performed during the hospital encounter of 01/07/19 (from the past 48 hour(s))  Rapid urine drug screen (hospital performed)     Status: None   Collection Time: 01/08/19  3:40 PM  Result Value Ref Range   Opiates NONE DETECTED NONE DETECTED   Cocaine NONE DETECTED NONE DETECTED   Benzodiazepines NONE DETECTED NONE DETECTED   Amphetamines NONE DETECTED NONE DETECTED   Tetrahydrocannabinol NONE DETECTED NONE DETECTED   Barbiturates NONE DETECTED NONE DETECTED    Comment: (NOTE) DRUG SCREEN FOR MEDICAL PURPOSES ONLY.  IF CONFIRMATION IS NEEDED FOR ANY PURPOSE, NOTIFY LAB WITHIN 5 DAYS. LOWEST DETECTABLE LIMITS FOR URINE DRUG SCREEN Drug Class                     Cutoff (ng/mL) Amphetamine and metabolites    1000 Barbiturate and metabolites    200 Benzodiazepine                 188 Tricyclics and metabolites     300 Opiates and metabolites        300 Cocaine and metabolites        300 THC                            50 Performed at [facility], [place] [address], [place], [place] [address]   Urinalysis, Complete w Microscopic     Status: Abnormal   Collection Time: 01/08/19  3:40 PM  Result Value Ref Range   Color, Urine YELLOW YELLOW   APPearance TURBID (A) CLEAR   Specific Gravity, Urine 1.024 1.005 - 1.030   pH 5.0 5.0 - 8.0   Glucose, UA NEGATIVE NEGATIVE mg/dL   Hgb urine dipstick NEGATIVE NEGATIVE  Bilirubin Urine NEGATIVE NEGATIVE   Ketones, ur NEGATIVE NEGATIVE mg/dL    Protein, ur NEGATIVE NEGATIVE mg/dL   Nitrite NEGATIVE NEGATIVE   Leukocytes,Ua NEGATIVE NEGATIVE   RBC / HPF 11-20 0 - 5 RBC/hpf   WBC, UA >50 (H) 0 - 5 WBC/hpf   Bacteria, UA NONE SEEN NONE SEEN   WBC Clumps PRESENT     Comment: Performed at [facility], [place] [address] [place], [place] [address]  Glucose, capillary     Status: Abnormal   Collection Time: 01/08/19  4:28 PM  Result Value Ref Range   Glucose-Capillary 110 (H) 70 - 99 mg/dL  Glucose, capillary     Status: Abnormal   Collection Time: 01/09/19  6:30 AM  Result Value Ref Range   Glucose-Capillary 109 (H) 70 - 99 mg/dL  Hemoglobin A1c     Status: Abnormal   Collection Time: 01/09/19  6:42 AM  Result Value Ref Range   Hgb A1c MFr Bld 6.6 (H) 4.8 - 5.6 %    Comment: (NOTE) Pre diabetes:          5.7%-6.4% Diabetes:              >6.4% Glycemic control for   <7.0% adults with diabetes    Mean Plasma Glucose 142.72 mg/dL    Comment: Performed at [facility], [place] [address], [place], [place] [address]  Lipid panel     Status: Abnormal   Collection Time: 01/09/19  6:42 AM  Result Value Ref Range   Cholesterol 170 0 - 200 mg/dL   Triglycerides 179 (H) <150 mg/dL   HDL 28 (L) >40 mg/dL   Total CHOL/HDL Ratio 6.1 RATIO   VLDL 36 0 - 40 mg/dL   LDL Cholesterol 106 (H) 0 - 99 mg/dL    Comment:        Total Cholesterol/HDL:CHD Risk Coronary Heart Disease Risk Table                     Men   Women  1/2 Average Risk   3.4   3.3  Average Risk       5.0   4.4  2 X Average Risk   9.6   7.1  3 X Average Risk  23.4   11.0        Use the calculated Patient Ratio above and the CHD Risk Table to determine the patient's CHD Risk.        ATP III CLASSIFICATION (LDL):  <100     mg/dL   Optimal  100-129  mg/dL   Near or Above                    Optimal  130-159  mg/dL   Borderline  160-189  mg/dL   High  >190     mg/dL   Very High Performed at [facility] [address]  [place], [place] [address]   Glucose, capillary     Status: None   Collection Time: 01/09/19 12:05 PM  Result Value Ref Range   Glucose-Capillary 96 70 - 99 mg/dL  Glucose, capillary     Status: Abnormal   Collection Time: 01/09/19  4:45 PM  Result Value Ref Range   Glucose-Capillary 167 (H) 70 - 99 mg/dL  Glucose, capillary     Status: Abnormal   Collection Time: 01/10/19  6:23 AM  Result Value Ref Range   Glucose-Capillary 106 (H) 70 -  99 mg/dL  Glucose, capillary     Status: Abnormal   Collection Time: 01/10/19 11:54 AM  Result Value Ref Range   Glucose-Capillary 122 (H) 70 - 99 mg/dL   Comment 1 Notify RN    Comment 2 Document in Chart     Blood Alcohol level:  Lab Results  Component Value Date   ETH <10 04/06/2018   ETH <10 63/84/6659    Metabolic Disorder Labs: Lab Results  Component Value Date   HGBA1C 6.6 (H) 01/09/2019   MPG 142.72 01/09/2019   MPG 114.02 04/07/2018   Lab Results  Component Value Date   PROLACTIN 16.1 (H) 04/07/2018   Lab Results  Component Value Date   CHOL 170 01/09/2019   TRIG 179 (H) 01/09/2019   HDL 28 (L) 01/09/2019   CHOLHDL 6.1 01/09/2019   VLDL 36 01/09/2019   LDLCALC 106 (H) 01/09/2019   LDLCALC 104 (H) 04/07/2018    Physical Findings: AIMS: Facial and Oral Movements Muscles of Facial Expression: None, normal Lips and Perioral Area: None, normal Jaw: None, normal Tongue: None, normal,Extremity Movements Upper (arms, wrists, hands, fingers): None, normal Lower (legs, knees, ankles, toes): None, normal, Trunk Movements Neck, shoulders, hips: None, normal, Overall Severity Severity of abnormal movements (highest score from questions above): None, normal Incapacitation due to abnormal movements: None, normal Patient's awareness of abnormal movements (rate only patient's report): No Awareness, Dental Status Current problems with teeth and/or dentures?: No Does patient usually wear dentures?: No  CIWA:    COWS:      Musculoskeletal: Strength & Muscle Tone: within normal limits Gait & Station: normal Patient leans: N/A  Psychiatric Specialty Exam: Physical Exam  ROS  Blood pressure 102/82, pulse (!) 103, temperature (!) 97.4 F (36.3 C), temperature source Oral, resp. rate 16, SpO2 100 %.There is no height or weight on file to calculate BMI.  General Appearance: Casual  Eye Contact:  Minimal  Speech:  Normal Rate  Volume:  Decreased  Mood:  Dysphoric  Affect:  Appropriate and Congruent  Thought Process:  Coherent, Linear and Descriptions of Associations: Circumstantial  Orientation:  Full (Time, Place, and Person)  Thought Content:  Logical and Rumination  Suicidal Thoughts:  No  Homicidal Thoughts:  No  Memory:  Immediate;   Fair  Judgement:  Fair  Insight:  Fair  Psychomotor Activity:  Normal  Concentration:  Concentration: Fair  Recall:  [employer] of Knowledge:  Fair  Language:  Fair  Akathisia:  Negative  Handed:  Right  AIMS (if indicated):     Assets:  Physical Health Resilience  ADL's:  Intact  Cognition:  WNL  Sleep:  Number of Hours: 6.75     Treatment Plan Summary: Daily contact with patient to assess and evaluate symptoms and progress in treatment and Medication management patient denies current suicidal thoughts but can contract for safety here only but reports they are intermittent.  We will monitor another 24 hours at least I will escalate his quetiapine no other changes in meds continue cognitive therapy  Johnn Hai, MD 01/10/2019, 1:23 PM

## 2019-01-10 NOTE — Progress Notes (Signed)
Recreation Therapy Notes  Date: 7.27.20 Time: 1000 Location: 500 Hall Dayroom  Group Topic: Wellness  Goal Area(s) Addresses:  Patient will define components of whole wellness. Patient will verbalize benefit of whole wellness.  Intervention: Exercise  Activity: Wellness.  LRT led group in a series of stretches.  Each patient led the group in exercises of their choosing.  Group was to complete at least 30 minutes of exercise.  Patients were allowed to take water breaks and rest breaks as needed.  Education: Wellness, Dentist.   Education Outcome: Acknowledges education/In group clarification offered/Needs additional education.   Clinical Observations/Feedback: Pt did not attend group session.     Victorino Sparrow, LRT/CTRS         Ria Comment, Kaiyden Simkin A 01/10/2019 11:19 AM

## 2019-01-10 NOTE — Tx Team (Signed)
Interdisciplinary Treatment and Diagnostic Plan Update  01/10/2019 Time of Session: 09:04am Marc Long MRN: [id number]  Principal Diagnosis: <principal problem not specified>  Secondary Diagnoses: Active Problems:   Schizophrenia ([place])   Current Medications:  Current Facility-Administered Medications  Medication Dose Route Frequency Provider Last Rate Last Dose  . acetaminophen (TYLENOL) tablet 650 mg  650 mg Oral Q6H PRN Laverle Hobby, PA-C      . alum & mag hydroxide-simeth (MAALOX/MYLANTA) 200-200-20 MG/5ML suspension 30 mL  30 mL Oral Q4H PRN Patriciaann Clan E, PA-C      . buPROPion (WELLBUTRIN XL) 24 hr tablet 150 mg  150 mg Oral Daily Connye Burkitt, NP   150 mg at 01/10/19 0753  . busPIRone (BUSPAR) tablet 15 mg  15 mg Oral BID Cobos, Myer Peer, MD   15 mg at 01/10/19 0753  . fluvoxaMINE (LUVOX) tablet 100 mg  100 mg Oral BID Cobos, Myer Peer, MD   100 mg at 01/10/19 0753  . lisinopril (ZESTRIL) tablet 20 mg  20 mg Oral Daily Cobos, Myer Peer, MD   20 mg at 01/10/19 0753  . LORazepam (ATIVAN) tablet 0.5 mg  0.5 mg Oral Q6H PRN Cobos, Myer Peer, MD      . OLANZapine zydis (ZYPREXA) disintegrating tablet 10 mg  10 mg Oral Q8H PRN Laverle Hobby, PA-C       And  . LORazepam (ATIVAN) tablet 1 mg  1 mg Oral PRN Patriciaann Clan E, PA-C       And  . ziprasidone (GEODON) injection 20 mg  20 mg Intramuscular PRN Patriciaann Clan E, PA-C      . magnesium hydroxide (MILK OF MAGNESIA) suspension 30 mL  30 mL Oral Daily PRN Laverle Hobby, PA-C      . metFORMIN (GLUCOPHAGE) tablet 1,000 mg  1,000 mg Oral BID WC Cobos, Myer Peer, MD   1,000 mg at 01/10/19 0752  . QUEtiapine (SEROQUEL) tablet 150 mg  150 mg Oral QHS Cobos, Myer Peer, MD   150 mg at 01/09/19 2113  . traZODone (DESYREL) tablet 50 mg  50 mg Oral QHS PRN,MR X 1 Laverle Hobby, PA-C   50 mg at 01/09/19 2113   PTA Medications: Medications Prior to Admission  Medication Sig Dispense Refill Last Dose  . aspirin  EC 81 MG tablet Take 81 mg by mouth daily.     . Multiple Vitamin (MULTIVITAMIN WITH MINERALS) TABS tablet Take 1 tablet by mouth daily.     . busPIRone (BUSPAR) 15 MG tablet Take 15 mg by mouth 3 (three) times daily.     . fluvoxaMINE (LUVOX) 100 MG tablet Take 1 tablet (100 mg total) by mouth 2 (two) times daily. For depression 60 tablet 0   . hydrOXYzine (ATARAX/VISTARIL) 50 MG tablet Take 1 tablet (50 mg total) by mouth every 6 (six) hours as needed for anxiety. 60 tablet 0   . lisinopril (ZESTRIL) 20 MG tablet Take 20 mg by mouth daily.      . metFORMIN (GLUCOPHAGE) 1000 MG tablet Take 1 tablet (1,000 mg total) by mouth 2 (two) times daily with a meal. For diabetes management 60 tablet 0   . pantoprazole (PROTONIX) 40 MG tablet Take 1 tablet (40 mg total) by mouth daily. For acid reflux (Patient not taking: Reported on 01/08/2019) 15 tablet 0 Not Taking at Unknown time  . QUEtiapine (SEROQUEL) 50 MG tablet Take 3 tablets (150 mg total) by mouth at bedtime. For mood control  90 tablet 0   . traZODone (DESYREL) 50 MG tablet Take 1 tablet (50 mg total) by mouth at bedtime as needed for sleep. 30 tablet 0     Patient Stressors: Financial difficulties  Patient Strengths: Ability for insight Active sense of humor  Treatment Modalities: Medication Management, Group therapy, Case management,  1 to 1 session with clinician, Psychoeducation, Recreational therapy.   Physician Treatment Plan for Primary Diagnosis: <principal problem not specified> Long Term Goal(s): Improvement in symptoms so as ready for discharge Improvement in symptoms so as ready for discharge   Short Term Goals: Ability to identify changes in lifestyle to reduce recurrence of condition will improve Ability to verbalize feelings will improve Ability to disclose and discuss suicidal ideas Ability to demonstrate self-control will improve Ability to identify and develop effective coping behaviors will improve Ability to  maintain clinical measurements within normal limits will improve Compliance with prescribed medications will improve Ability to identify changes in lifestyle to reduce recurrence of condition will improve Ability to verbalize feelings will improve Ability to disclose and discuss suicidal ideas Ability to demonstrate self-control will improve Ability to identify and develop effective coping behaviors will improve Ability to maintain clinical measurements within normal limits will improve  Medication Management: Evaluate patient's response, side effects, and tolerance of medication regimen.  Therapeutic Interventions: 1 to 1 sessions, Unit Group sessions and Medication administration.  Evaluation of Outcomes: Not Progressing  Physician Treatment Plan for Secondary Diagnosis: Active Problems:   Schizophrenia ([place])  Long Term Goal(s): Improvement in symptoms so as ready for discharge Improvement in symptoms so as ready for discharge   Short Term Goals: Ability to identify changes in lifestyle to reduce recurrence of condition will improve Ability to verbalize feelings will improve Ability to disclose and discuss suicidal ideas Ability to demonstrate self-control will improve Ability to identify and develop effective coping behaviors will improve Ability to maintain clinical measurements within normal limits will improve Compliance with prescribed medications will improve Ability to identify changes in lifestyle to reduce recurrence of condition will improve Ability to verbalize feelings will improve Ability to disclose and discuss suicidal ideas Ability to demonstrate self-control will improve Ability to identify and develop effective coping behaviors will improve Ability to maintain clinical measurements within normal limits will improve     Medication Management: Evaluate patient's response, side effects, and tolerance of medication regimen.  Therapeutic Interventions: 1 to 1  sessions, Unit Group sessions and Medication administration.  Evaluation of Outcomes: Not Progressing   RN Treatment Plan for Primary Diagnosis: <principal problem not specified> Long Term Goal(s): Knowledge of disease and therapeutic regimen to maintain health will improve  Short Term Goals: Ability to participate in decision making will improve, Ability to verbalize feelings will improve, Ability to disclose and discuss suicidal ideas, Ability to identify and develop effective coping behaviors will improve and Compliance with prescribed medications will improve  Medication Management: RN will administer medications as ordered by provider, will assess and evaluate patient's response and provide education to patient for prescribed medication. RN will report any adverse and/or side effects to prescribing provider.  Therapeutic Interventions: 1 on 1 counseling sessions, Psychoeducation, Medication administration, Evaluate responses to treatment, Monitor vital signs and CBGs as ordered, Perform/monitor CIWA, COWS, AIMS and Fall Risk screenings as ordered, Perform wound care treatments as ordered.  Evaluation of Outcomes: Not Progressing   LCSW Treatment Plan for Primary Diagnosis: <principal problem not specified> Long Term Goal(s): Safe transition to appropriate next level of care at  discharge, Engage patient in therapeutic group addressing interpersonal concerns.  Short Term Goals: Engage patient in aftercare planning with referrals and resources and Increase skills for wellness and recovery  Therapeutic Interventions: Assess for all discharge needs, 1 to 1 time with Social worker, Explore available resources and support systems, Assess for adequacy in community support network, Educate family and significant other(s) on suicide prevention, Complete Psychosocial Assessment, Interpersonal group therapy.  Evaluation of Outcomes: Not Progressing   Progress in Treatment: Attending groups:  Yes. Participating in groups: Yes. Taking medication as prescribed: Yes. Toleration medication: Yes. Family/Significant other contact made: No, will contact:  pt's mother Patient understands diagnosis: Yes. Discussing patient identified problems/goals with staff: Yes. Medical problems stabilized or resolved: Yes. Denies suicidal/homicidal ideation: No. Issues/concerns per patient self-inventory: No. Other:   New problem(s) identified: No, Describe:  None  New Short Term/Long Term Goal(s): Medication stabilization, elimination of SI thoughts, and development of a comprehensive mental wellness plan.   Patient Goals:  "I am [facility] because I don't have the ability to feel anymore. I used to have 51 visions of killing myself so I was put on medications but the medications make me not feel"  Discharge Plan or Barriers: CSW will continue to follow up for appropriate referrals and possible discharge planning  Reason for Continuation of Hospitalization: Depression Medication stabilization Suicidal ideation  Estimated Length of Stay: 2-3 days  Attendees: Patient: 01/10/2019   Physician: Dr. Johnn Hai, MD 01/10/2019   Nursing: Estill Bamberg, [place] 01/10/2019  RN Care Manager: 01/10/2019   Social Worker: Ardelle Anton, LCSW 01/10/2019  Recreational Therapist:  01/10/2019   Other:  01/10/2019   Other:  01/10/2019   Other: 01/10/2019       Scribe for Treatment Team: Trecia Rogers, LCSW 01/10/2019 9:56 AM

## 2019-01-10 NOTE — Progress Notes (Signed)
Patient ID: Marc Long, male   DOB: July 29, 1975, 43 y.o.   MRN: [id number]  Nursing Progress Note 7a-7p   Patient presents calm, pleasant and cooperative on approach. Patient complaint with scheduled medications this morning and denies concerns at this time. Patient denies SI/HI/AVH. Patient declined to complete self-inventory sheet. q15 minute safety checks in place. Patient remains safe on the unit. Will continue to support and monitor with plan of care.

## 2019-01-10 NOTE — Progress Notes (Signed)
Recreation Therapy Notes  INPATIENT RECREATION THERAPY ASSESSMENT  Patient Details Name: Zach Tietje MRN: [id number] DOB: Dec 24, 1975 Today's Date: 01/10/2019       Information Obtained From: Patient  Able to Participate in Assessment/Interview: Yes  Patient Presentation: Alert  Reason for Admission (Per Patient): Suicidal Ideation  Patient Stressors: Other (Comment)(Finances; Feeling worthless; Guilt)  Coping Skills:   Isolation, TV, Music, Talk, Avoidance  Leisure Interests (2+):  Individual - Computer, Individual - TV(Pen/Paper Role Play)  Frequency of Recreation/Participation: Other (Comment)(Computer- Daily; Role Play- Weekly)  Awareness of Community Resources:  Yes  Community Resources:  Restaurants, Other (Comment)(Stores; Resturants)  Current Use: No  If no, Barriers?: Other (Comment)(COVID shutdown)  Expressed Interest in [employer] Information: No  County of Residence:  [place]  Patient Main Form of Transportation: Other (Comment)(Roommate)  Patient Strengths:  Intelligence; Polite  Patient Identified Areas of Improvement:  [employer]; Assertiveness  Patient Goal for Hospitalization:  "get to the point where I can feel more emotionally"  Current SI (including self-harm):  No  Current HI:  No  Current AVH: No  Staff Intervention Plan: Group Attendance, Collaborate with Interdisciplinary Treatment Team  Consent to Intern Participation: N/A    Victorino Sparrow, LRT/CTRS  Victorino Sparrow A 01/10/2019, 12:10 PM

## 2019-01-10 NOTE — Progress Notes (Signed)
[place] NOVEL CORONAVIRUS (COVID-19) DAILY CHECK-OFF SYMPTOMS - answer yes or no to each - every day NO YES  Have you had a fever in the past 24 hours?  . Fever (Temp > 37.80C / 100F) X   Have you had any of these symptoms in the past 24 hours? . New Cough .  Sore Throat  .  Shortness of Breath .  Difficulty Breathing .  Unexplained Body Aches   X   Have you had any one of these symptoms in the past 24 hours not related to allergies?   . Runny Nose .  Nasal Congestion .  Sneezing   X   If you have had runny nose, nasal congestion, sneezing in the past 24 hours, has it worsened?  X   EXPOSURES - check yes or no X   Have you traveled outside the state in the past 14 days?  X   Have you been in contact with someone with a confirmed diagnosis of COVID-19 or PUI in the past 14 days without wearing appropriate PPE?  X   Have you been living in the same home as a person with confirmed diagnosis of COVID-19 or a PUI (household contact)?    X   Have you been diagnosed with COVID-19?    X              What to do next: Answered NO to all: Answered YES to anything:   Proceed with unit schedule Follow the [facility] Inpatient Flowsheet.   

## 2019-01-11 LAB — GLUCOSE, CAPILLARY: Glucose-Capillary: 111 mg/dL — ABNORMAL HIGH (ref 70–99)

## 2019-01-11 NOTE — Progress Notes (Signed)
[facility] MD Progress Note  01/11/2019 9:21 AM Marc Long  MRN:  [id number] Subjective:   Patient in bed he has not participated much in groups but he is alert and oriented to person place situation states "I want to have ECT" but also states "I can go home today if you want me to" so somewhat of a paradoxical message but denies thoughts of harming self today contracting here Principal Problem: Depression Diagnosis: Active Problems:   Schizophrenia ([place])  Total Time spent with patient: 20 minutes  Past Medical History:  Past Medical History:  Diagnosis Date  . Depression   . Diabetes mellitus without complication ([place])   . Gallstones   . Obesity, Class III, BMI 40-49.9 (morbid obesity) ([place])    History reviewed. No pertinent surgical history. Family History:  Family History  Problem Relation Age of Onset  . Hypertension Mother   . Depression Mother   . Heart attack Maternal Grandfather   . Lymphoma Maternal Grandmother   . Diabetes Maternal Aunt   . Cancer Maternal Aunt    Family Psychiatric  History: no new data Social History:  Social History   Substance and Sexual Activity  Alcohol Use Yes  . Alcohol/week: 0.0 standard drinks   Comment: once every 2 weeks. Wine coolers     Social History   Substance and Sexual Activity  Drug Use No    Social History   Socioeconomic History  . Marital status: Single    Spouse name: Not on file  . Number of children: 0  . Years of education: Not on file  . Highest education level: Not on file  Occupational History  . Occupation: call center  Social Needs  . Financial resource strain: Not on file  . Food insecurity    Worry: Not on file    Inability: Not on file  . Transportation needs    Medical: Not on file    Non-medical: Not on file  Tobacco Use  . Smoking status: Never Smoker  . Smokeless tobacco: Never Used  Substance and Sexual Activity  . Alcohol use: Yes    Alcohol/week: 0.0 standard drinks    Comment:  once every 2 weeks. Wine coolers  . Drug use: No  . Sexual activity: Not Currently    Birth control/protection: None  Lifestyle  . Physical activity    Days per week: Not on file    Minutes per session: Not on file  . Stress: Not on file  Relationships  . Social Herbalist on phone: Not on file    Gets together: Not on file    Attends religious service: Not on file    Active member of club or organization: Not on file    Attends meetings of clubs or organizations: Not on file    Relationship status: Not on file  Other Topics Concern  . Not on file  Social History Narrative  . Not on file   Additional Social History:    Pain Medications: See MAR Prescriptions: See MAR Over the Counter: See MAR History of alcohol / drug use?: No history of alcohol / drug abuse                    Sleep: Good  Appetite:  Good  Current Medications: Current Facility-Administered Medications  Medication Dose Route Frequency Provider Last Rate Last Dose  . acetaminophen (TYLENOL) tablet 650 mg  650 mg Oral Q6H PRN Laverle Hobby, PA-C      .  alum & mag hydroxide-simeth (MAALOX/MYLANTA) 200-200-20 MG/5ML suspension 30 mL  30 mL Oral Q4H PRN Patriciaann Clan E, PA-C      . buPROPion (WELLBUTRIN XL) 24 hr tablet 150 mg  150 mg Oral Daily Connye Burkitt, NP   150 mg at 01/11/19 0804  . busPIRone (BUSPAR) tablet 15 mg  15 mg Oral BID Cobos, Myer Peer, MD   15 mg at 01/11/19 0804  . fluvoxaMINE (LUVOX) tablet 100 mg  100 mg Oral BID Cobos, Myer Peer, MD   100 mg at 01/11/19 0814  . lisinopril (ZESTRIL) tablet 20 mg  20 mg Oral Daily Cobos, Myer Peer, MD   20 mg at 01/11/19 0804  . OLANZapine zydis (ZYPREXA) disintegrating tablet 10 mg  10 mg Oral Q8H PRN Laverle Hobby, PA-C       And  . LORazepam (ATIVAN) tablet 1 mg  1 mg Oral PRN Patriciaann Clan E, PA-C       And  . ziprasidone (GEODON) injection 20 mg  20 mg Intramuscular PRN Patriciaann Clan E, PA-C      . magnesium  hydroxide (MILK OF MAGNESIA) suspension 30 mL  30 mL Oral Daily PRN Laverle Hobby, PA-C      . metFORMIN (GLUCOPHAGE) tablet 1,000 mg  1,000 mg Oral BID WC Cobos, Myer Peer, MD   1,000 mg at 01/11/19 0804  . QUEtiapine (SEROQUEL) tablet 200 mg  200 mg Oral QHS Johnn Hai, MD   200 mg at 01/10/19 2106  . traZODone (DESYREL) tablet 50 mg  50 mg Oral QHS PRN,MR X 1 Laverle Hobby, PA-C   50 mg at 01/10/19 2106    Lab Results:  Results for orders placed or performed during the hospital encounter of 01/07/19 (from the past 48 hour(s))  Glucose, capillary     Status: None   Collection Time: 01/09/19 12:05 PM  Result Value Ref Range   Glucose-Capillary 96 70 - 99 mg/dL  Glucose, capillary     Status: Abnormal   Collection Time: 01/09/19  4:45 PM  Result Value Ref Range   Glucose-Capillary 167 (H) 70 - 99 mg/dL  Glucose, capillary     Status: Abnormal   Collection Time: 01/10/19  6:23 AM  Result Value Ref Range   Glucose-Capillary 106 (H) 70 - 99 mg/dL  Glucose, capillary     Status: Abnormal   Collection Time: 01/10/19 11:54 AM  Result Value Ref Range   Glucose-Capillary 122 (H) 70 - 99 mg/dL   Comment 1 Notify RN    Comment 2 Document in Chart   Glucose, capillary     Status: Abnormal   Collection Time: 01/11/19  6:06 AM  Result Value Ref Range   Glucose-Capillary 111 (H) 70 - 99 mg/dL    Blood Alcohol level:  Lab Results  Component Value Date   ETH <10 04/06/2018   ETH <10 27/11/2374    Metabolic Disorder Labs: Lab Results  Component Value Date   HGBA1C 6.6 (H) 01/09/2019   MPG 142.72 01/09/2019   MPG 114.02 04/07/2018   Lab Results  Component Value Date   PROLACTIN 16.1 (H) 04/07/2018   Lab Results  Component Value Date   CHOL 170 01/09/2019   TRIG 179 (H) 01/09/2019   HDL 28 (L) 01/09/2019   CHOLHDL 6.1 01/09/2019   VLDL 36 01/09/2019   LDLCALC 106 (H) 01/09/2019   LDLCALC 104 (H) 04/07/2018    Physical Findings: AIMS: Facial and Oral  Movements Muscles of  Facial Expression: None, normal Lips and Perioral Area: None, normal Jaw: None, normal Tongue: None, normal,Extremity Movements Upper (arms, wrists, hands, fingers): None, normal Lower (legs, knees, ankles, toes): None, normal, Trunk Movements Neck, shoulders, hips: None, normal, Overall Severity Severity of abnormal movements (highest score from questions above): None, normal Incapacitation due to abnormal movements: None, normal Patient's awareness of abnormal movements (rate only patient's report): No Awareness, Dental Status Current problems with teeth and/or dentures?: No Does patient usually wear dentures?: No  CIWA:    COWS:     Musculoskeletal: Strength & Muscle Tone: within normal limits Gait & Station: normal Psychiatric Specialty Exam: Physical Exam  ROS  Blood pressure 113/71, pulse 94, temperature (!) 97.5 F (36.4 C), temperature source Oral, resp. rate 18, SpO2 100 %.There is no height or weight on file to calculate BMI.  General Appearance: Casual  Eye Contact:  Fair  Speech:  Clear and Coherent  Volume:  Decreased  Mood:  Dysphoric  Affect:  Appropriate and Congruent  Thought Process:  Coherent and Descriptions of Associations: Intact  Orientation:  Full (Time, Place, and Person)  Thought Content:  Logical  Suicidal Thoughts:  No  Homicidal Thoughts:  No  Memory:  Immediate;   Fair  Judgement:  Fair  Insight:  Fair  Psychomotor Activity:  Normal  Concentration:  Concentration: Fair  Recall:  [employer] of Knowledge:  Fair  Language:  Fair  Akathisia:  Negative  Handed:  Right  AIMS (if indicated):     Assets:  Leisure Time Physical Health Resilience  ADL's:  Intact  Cognition:  WNL  Sleep:  Number of Hours: 6.75     Treatment Plan Summary: Daily contact with patient to assess and evaluate symptoms and progress in treatment and Medication management continue current cognitive therapy and continue current medication regimen  probable discharge 24 to 48 hours no change in precautions  Kariem Wolfson, MD 01/11/2019, 9:21 AM

## 2019-01-11 NOTE — Progress Notes (Signed)
Adult Psychoeducational Group Note  Date:  01/11/2019 Time:  8:23 PM  Group Topic/Focus:  Wrap-Up Group:   The focus of this group is to help patients review their daily goal of treatment and discuss progress on daily workbooks.  Participation Level:  Active  Participation Quality:  Appropriate  Affect:  Appropriate  Cognitive:  Appropriate  Insight: Appropriate  Engagement in Group:  Engaged  Modes of Intervention:  Discussion  Additional Comments:  Patient attended group and said that his day was a 3.  His goal for today was to stay awake longer and he did.    Marc Long W Cassara Nida 5/88/5027, 8:23 PM

## 2019-01-11 NOTE — Progress Notes (Signed)
Recreation Therapy Notes    Date: 7.28.20 Time: 1000 Location: 500 Hall Dayroom  Group Topic: Triggers  Goal Area(s) Addresses:  Patient will identify three biggest triggers.   Patient will identify how to avoid triggers. Patient will identify how to face triggers head on.  Behavioral Response: Engaged  Intervention: Worksheet  Activity: Triggers.  Patients were to identify what triggers certain behaviors in them.  Patients would then identify how they can avoid dealing with their triggers.  Patient would also explain how they could face triggers head on when they can't be avoided.  Education: Triggers, Discharge Planning   Education Outcome: Acknowledges education/In group clarification offered/Needs additional education.   Clinical Observations/Feedback: Patient identified his triggers were talking about money and stress in general.  Pt expressed he avoids the triggers by not talking about money and doing something he has control over.  Pt deals with triggers head on by being honest and upfront about money and think of solutions to stressful situations.    Marc Long, LRT/CTRS     Marc Long, Marc Long 01/11/2019 10:38 AM

## 2019-01-11 NOTE — Progress Notes (Signed)
[place] NOVEL CORONAVIRUS (COVID-19) DAILY CHECK-OFF SYMPTOMS - answer yes or no to each - every day NO YES  Have you had a fever in the past 24 hours?  . Fever (Temp > 37.80C / 100F) X   Have you had any of these symptoms in the past 24 hours? . New Cough .  Sore Throat  .  Shortness of Breath .  Difficulty Breathing .  Unexplained Body Aches   X   Have you had any one of these symptoms in the past 24 hours not related to allergies?   . Runny Nose .  Nasal Congestion .  Sneezing   X   If you have had runny nose, nasal congestion, sneezing in the past 24 hours, has it worsened?  X   EXPOSURES - check yes or no X   Have you traveled outside the state in the past 14 days?  X   Have you been in contact with someone with a confirmed diagnosis of COVID-19 or PUI in the past 14 days without wearing appropriate PPE?  X   Have you been living in the same home as a person with confirmed diagnosis of COVID-19 or a PUI (household contact)?    X   Have you been diagnosed with COVID-19?    X              What to do next: Answered NO to all: Answered YES to anything:   Proceed with unit schedule Follow the [facility] Inpatient Flowsheet.   

## 2019-01-11 NOTE — Progress Notes (Signed)
Patient ID: Marc Long, male   DOB: 1975/10/11, 43 y.o.   MRN: [id number]  Nursing Progress Note [id number]  Patient presents with animated affect and is pleasant on approach. Patient compliant with scheduled medications. Patient is seen visible in the milieu. Patient currently reports he is "always SI" but denies active intent, plan or thoughts in this moment. Patient denies HI/AVH.   Patient safety maintained with q15 min safety checks.   Patient remains safe on the unit at this time. Will continue to support and monitor with plan of care.   Patient's self-inventory sheet Rated Energy Level  Low  Rated Sleep  Good  Rated Appetite  Good  Rated Anxiety (0-10)  7  Rated Hopelessness (0-10)  4  Rated Depression (0-10)  3  Daily Goal  "stay awake more than normal"  Any Additional Comments:

## 2019-01-11 NOTE — [facility] Suicide Risk Assessment (Signed)
[place] INPATIENT:  Family/Significant Other Suicide Prevention Education  Suicide Prevention Education:  Education Completed; Pt's mother, Alvina Filbert,  has been identified by the patient as the family member/significant other with whom the patient will be residing, and identified as the person(s) who will aid the patient in the event of a mental health crisis (suicidal ideations/suicide attempt).  With written consent from the patient, the family member/significant other has been provided the following suicide prevention education, prior to the and/or following the discharge of the patient.  The suicide prevention education provided includes the following:  Suicide risk factors  Suicide prevention and interventions  National Suicide Hotline telephone number  [facility] assessment telephone number  [facility] Emergency Assistance [place] and/or Residential Mobile Crisis Unit telephone number  Request made of family/significant other to:  Remove weapons (e.g., guns, rifles, knives), all items previously/currently identified as safety concern.    Remove drugs/medications (over-the-counter, prescriptions, illicit drugs), all items previously/currently identified as a safety concern.  The family member/significant other verbalizes understanding of the suicide prevention education information provided.  The family member/significant other agrees to remove the items of safety concern listed above.   CSW contacted pt's mother, Alvina Filbert. Pt's mother stated that she talked to the patient the day before yesterday and knows that he got put on a different medication. She is not sure if it is working or not. Patient's mother reports that she thought he would be in the hospital longer and asked about him receiving ECT and why he isn't going to receive ECT. Pt's mother denied having another other questions or concerns and validated that the patient does not have weapons  in his home and that he has resources to get help if he becomes harmful to himself or others.   Trecia Rogers 01/11/2019, 10:39 AM

## 2019-01-12 LAB — GLUCOSE, CAPILLARY
Glucose-Capillary: 104 mg/dL — ABNORMAL HIGH (ref 70–99)
Glucose-Capillary: 139 mg/dL — ABNORMAL HIGH (ref 70–99)

## 2019-01-12 MED ORDER — BUPROPION HCL ER (XL) 300 MG PO TB24
300.0000 mg | ORAL_TABLET | Freq: Every day | ORAL | Status: DC
  Administered 2019-01-13 – 2019-01-14 (×2): 300 mg via ORAL
  Filled 2019-01-12 (×4): qty 1

## 2019-01-12 MED ORDER — PRENATAL MULTIVITAMIN CH
1.0000 | ORAL_TABLET | Freq: Every day | ORAL | Status: DC
  Administered 2019-01-12 – 2019-01-14 (×3): 1 via ORAL
  Filled 2019-01-12 (×5): qty 1

## 2019-01-12 NOTE — Care Management (Signed)
CMA spoke with Admissions at [facility]. At this time [facility] is not accepting new patients for ACTT services.   CMA will continue to follow up with [facility] and has notified [place], [place].      Boysie Bonebrake Care Management Assistant  Email:[email or web address] Office: [phone]

## 2019-01-12 NOTE — Progress Notes (Signed)
D: Pt  Passive SI-contracts for safetydenies HI/AVH. Pt is pleasant and cooperative. Pt stated he felt the same. Pt visible on the unit at times.   A: Pt was offered support and encouragement. Pt was given scheduled medications. Pt was encourage to attend groups. Q 15 minute checks were done for safety.   R:Pt attends groups and interacts well with peers and staff. Pt is taking medication. Pt has no complaints.Pt receptive to treatment and safety maintained on unit.  Problem: Education: Goal: Emotional status will improve Outcome: Progressing   Problem: Education: Goal: Mental status will improve Outcome: Progressing

## 2019-01-12 NOTE — Progress Notes (Signed)
Patient ID: Marc Long, male   DOB: 1976/01/03, 43 y.o.   MRN: [id number]   CSW was contacted by patient's therapist, Elnoria Howard, at [facility]. Pt's therapist stated that he spoke with the patient the night before and the patient is exhibiting current SI. Pt's therapist states that the patient is not being honest with the hospital staff and the patient's therapist is concerned about him being discharged. Patient's therapist states that once the patient is discharging that he will be able to meet with the patient that evening for a session. Patient's therapist discussed the patient needing ACTT services due to his hospitalizations and needing more than outpatient services and needing an enhanced service. CSW transferred the patient's therapist to the doctor regarding the discharge questions.

## 2019-01-12 NOTE — Progress Notes (Signed)
[facility] MD Progress Note  01/12/2019 10:55 AM Marc Long  MRN:  [id number] Subjective:   Patient believes his meds are having some effect but he still has residual depressive symptoms anhedonia and low energy and motivation spoke with his therapist who believes that the patient could benefit from an act team given that he has had such chronicity and severity of illness.  The patient himself is tolerating meds he is alert and oriented to person place situation affect showing more range we will escalate Wellbutrin.  No suicidal thoughts today although he confessed to his therapist that he had them intermittently but could contract. Principal Problem: Severity of depression Diagnosis: Active Problems:   Schizophrenia ([place])  Total Time spent with patient: 20 minutes  Past Psychiatric History: exstensive  Past Medical History:  Past Medical History:  Diagnosis Date  . Depression   . Diabetes mellitus without complication ([place])   . Gallstones   . Obesity, Class III, BMI 40-49.9 (morbid obesity) ([place])    History reviewed. No pertinent surgical history. Family History:  Family History  Problem Relation Age of Onset  . Hypertension Mother   . Depression Mother   . Heart attack Maternal Grandfather   . Lymphoma Maternal Grandmother   . Diabetes Maternal Aunt   . Cancer Maternal Aunt    Family Psychiatric  History: see eval Social History:  Social History   Substance and Sexual Activity  Alcohol Use Yes  . Alcohol/week: 0.0 standard drinks   Comment: once every 2 weeks. Wine coolers     Social History   Substance and Sexual Activity  Drug Use No    Social History   Socioeconomic History  . Marital status: Single    Spouse name: Not on file  . Number of children: 0  . Years of education: Not on file  . Highest education level: Not on file  Occupational History  . Occupation: call center  Social Needs  . Financial resource strain: Not on file  . Food insecurity   Worry: Not on file    Inability: Not on file  . Transportation needs    Medical: Not on file    Non-medical: Not on file  Tobacco Use  . Smoking status: Never Smoker  . Smokeless tobacco: Never Used  Substance and Sexual Activity  . Alcohol use: Yes    Alcohol/week: 0.0 standard drinks    Comment: once every 2 weeks. Wine coolers  . Drug use: No  . Sexual activity: Not Currently    Birth control/protection: None  Lifestyle  . Physical activity    Days per week: Not on file    Minutes per session: Not on file  . Stress: Not on file  Relationships  . Social Herbalist on phone: Not on file    Gets together: Not on file    Attends religious service: Not on file    Active member of club or organization: Not on file    Attends meetings of clubs or organizations: Not on file    Relationship status: Not on file  Other Topics Concern  . Not on file  Social History Narrative  . Not on file   Additional Social History:    Pain Medications: See MAR Prescriptions: See MAR Over the Counter: See MAR History of alcohol / drug use?: No history of alcohol / drug abuse  Sleep: Good  Appetite:  Good  Current Medications: Current Facility-Administered Medications  Medication Dose Route Frequency Provider Last Rate Last Dose  . acetaminophen (TYLENOL) tablet 650 mg  650 mg Oral Q6H PRN Laverle Hobby, PA-C      . alum & mag hydroxide-simeth (MAALOX/MYLANTA) 200-200-20 MG/5ML suspension 30 mL  30 mL Oral Q4H PRN Laverle Hobby, PA-C      . [START ON 01/13/2019] buPROPion (WELLBUTRIN XL) 24 hr tablet 300 mg  300 mg Oral Daily Johnn Hai, MD      . busPIRone (BUSPAR) tablet 15 mg  15 mg Oral BID Cobos, Myer Peer, MD   15 mg at 01/12/19 0800  . fluvoxaMINE (LUVOX) tablet 100 mg  100 mg Oral BID Cobos, Myer Peer, MD   100 mg at 01/12/19 0801  . lisinopril (ZESTRIL) tablet 20 mg  20 mg Oral Daily Cobos, Myer Peer, MD   20 mg at 01/12/19 0801  .  OLANZapine zydis (ZYPREXA) disintegrating tablet 10 mg  10 mg Oral Q8H PRN Laverle Hobby, PA-C   10 mg at 01/12/19 0801   And  . LORazepam (ATIVAN) tablet 1 mg  1 mg Oral PRN Patriciaann Clan E, PA-C       And  . ziprasidone (GEODON) injection 20 mg  20 mg Intramuscular PRN Patriciaann Clan E, PA-C      . magnesium hydroxide (MILK OF MAGNESIA) suspension 30 mL  30 mL Oral Daily PRN Laverle Hobby, PA-C      . metFORMIN (GLUCOPHAGE) tablet 1,000 mg  1,000 mg Oral BID WC Cobos, Myer Peer, MD   1,000 mg at 01/12/19 0801  . prenatal vitamin w/FE, FA (NATACHEW) chewable tablet 1 tablet  1 tablet Oral Q1200 Johnn Hai, MD      . QUEtiapine (SEROQUEL) tablet 200 mg  200 mg Oral QHS Johnn Hai, MD   200 mg at 01/11/19 2112  . traZODone (DESYREL) tablet 50 mg  50 mg Oral QHS PRN,MR X 1 Laverle Hobby, PA-C   50 mg at 01/11/19 2112    Lab Results:  Results for orders placed or performed during the hospital encounter of 01/07/19 (from the past 48 hour(s))  Glucose, capillary     Status: Abnormal   Collection Time: 01/10/19 11:54 AM  Result Value Ref Range   Glucose-Capillary 122 (H) 70 - 99 mg/dL   Comment 1 Notify RN    Comment 2 Document in Chart   Glucose, capillary     Status: Abnormal   Collection Time: 01/11/19  6:06 AM  Result Value Ref Range   Glucose-Capillary 111 (H) 70 - 99 mg/dL  Glucose, capillary     Status: Abnormal   Collection Time: 01/12/19  6:18 AM  Result Value Ref Range   Glucose-Capillary 104 (H) 70 - 99 mg/dL    Blood Alcohol level:  Lab Results  Component Value Date   ETH <10 04/06/2018   ETH <10 88/28/0034    Metabolic Disorder Labs: Lab Results  Component Value Date   HGBA1C 6.6 (H) 01/09/2019   MPG 142.72 01/09/2019   MPG 114.02 04/07/2018   Lab Results  Component Value Date   PROLACTIN 16.1 (H) 04/07/2018   Lab Results  Component Value Date   CHOL 170 01/09/2019   TRIG 179 (H) 01/09/2019   HDL 28 (L) 01/09/2019   CHOLHDL 6.1 01/09/2019    VLDL 36 01/09/2019   LDLCALC 106 (H) 01/09/2019   LDLCALC 104 (H) 04/07/2018  Physical Findings: AIMS: Facial and Oral Movements Muscles of Facial Expression: None, normal Lips and Perioral Area: None, normal Jaw: None, normal Tongue: None, normal,Extremity Movements Upper (arms, wrists, hands, fingers): None, normal Lower (legs, knees, ankles, toes): None, normal, Trunk Movements Neck, shoulders, hips: None, normal, Overall Severity Severity of abnormal movements (highest score from questions above): None, normal Incapacitation due to abnormal movements: None, normal Patient's awareness of abnormal movements (rate only patient's report): No Awareness, Dental Status Current problems with teeth and/or dentures?: No Does patient usually wear dentures?: No  CIWA:    COWS:     Musculoskeletal: Strength & Muscle Tone: within normal limits Gait & Station: normal Patient leans: N/A  Psychiatric Specialty Exam: Physical Exam  ROS  Blood pressure 138/87, pulse 98, temperature 98.7 F (37.1 C), temperature source Oral, resp. rate 18, SpO2 100 %.There is no height or weight on file to calculate BMI.  General Appearance: Casual  Eye Contact:  Good  Speech:  Normal Rate  Volume:  Normal  Mood:  Dysphoric  Affect:  Appropriate and Congruent  Thought Process:  Goal Directed and Descriptions of Associations: Tangential  Orientation:  Full (Time, Place, and Person)  Thought Content:  Logical  Suicidal Thoughts:  Yes.  without intent/plan  Homicidal Thoughts:  No  Memory:  Recent;   Fair  Judgement:  Fair  Insight:  Fair  Psychomotor Activity:  Normal  Concentration:  Concentration: Fair  Recall:  [employer] of Knowledge:  Fair  Language:  Fair  Akathisia:  Negative  Handed:  Right  AIMS (if indicated):     Assets:  Resilience Social Support  ADL's:  Intact  Cognition:  WNL  Sleep:  Number of Hours: 6.75     Treatment Plan Summary: Daily contact with patient to assess  and evaluate symptoms and progress in treatment, Medication management and Plan Continue cognitive therapy escalate Wellbutrin continue current precautions probable discharge by Friday but again explore options for follow-up care  Johnn Hai, MD 01/12/2019, 10:55 AM

## 2019-01-12 NOTE — Care Management (Signed)
CMA sent referral to [facility] for ACTT services.   CMA will follow up.   CMA will notify CSW, Jasmine.       Eric Morganti Care Management Assistant  Email:[email or web address] Office: [phone]

## 2019-01-12 NOTE — Progress Notes (Signed)
Recreation Therapy Notes  Date: 7.29.20 Time: 1000 Location: 500 Hall Day Room  Group Topic: Communication, Team Building, Problem Solving  Goal Area(s) Addresses:  Patient will effectively work with peer towards shared goal.  Patient will identify skill used to make activity successful.  Patient will identify how skills used during activity can be used to reach post d/c goals.   Behavioral Response: Engaged  Intervention: STEM Activity   Activity: Straw Bridge.  In groups, patients were given 15 plastic straws and about 2 feet of masking tape.  Patients were to use the supplies provided to them to construct an elevated bridge that could hold a small puzzle box.  Education: Education officer, community, Dentist.   Education Outcome: Acknowledges education  Clinical Observations/Feedback:  Pt was active and worked well with his peers.  Pt was quiet but attentive.  Pt expressed the activity was fun and a challenge.     Victorino Sparrow, LRT/CTRS     Ria Comment, Micaylah Bertucci A 01/12/2019 10:50 AM

## 2019-01-12 NOTE — Progress Notes (Signed)
Patient ID: Marc Long, male   DOB: 10-07-1975, 43 y.o.   MRN: [id number] D: Patient calm and cooperative with care, endorsed +SI earlier in the shift, contracted for safety on the unit, also endorsed +HI towards two other patients on the unit, but stated to staff that those thoughts have resolved, and that he no longer feels that way.  Pt denies VH, and has been encouraged all day today to stay within eye reach of staff, even though he is being monitored Q61mins.    Pt reports a good night's sleep last night, reports a good appetite within the past 24 hrs, reports a low energy level, and a good concentration level. Pt rates his depression level today as 7(10 being the worst), rates his hopeslessness level as 9 (10 being the worst), and rates his anxiety level as 0 (10 being the worst)the patient reports that what is important for him today is "determining what's best as far as staying or leaving the facility", and states he plans on "being honest with doctor and self...just that I've been having bad thoughts".  A: Pt being maintained on Q15 minute checks and is being given all meds as scheduled.  R: Will continue to monitor on Q15 minute checks

## 2019-01-12 NOTE — [facility] Group Notes (Signed)
Occupational Therapy Group Note  Date:  01/12/2019 Time:  8:18 PM  Group Topic/Focus:  Leisure Group  Participation Level:  Active  Participation Quality:  Appropriate  Affect:  Flat  Cognitive:  Alert  Insight: Improving  Engagement in Group:  Engaged  Modes of Intervention:  Activity, Discussion, Education and Socialization  Additional Comments:    S: "This was really fun"  O: OT group focus on leisure this date, while incorporating coping skills to ensure understanding. Pts to play game of Uno: and name a struggle they're facing, a communication skill, something they like about yourself, stress management, and a healthy way to manage anger per certain cards played. Pt also assessed for attention, ability to follow rules, and temperament with rules.   A: Pt presents with flat affect, very engaged and participatory throughout session. Pt appropriately interactive with peers and needing no cues for rules/attention. At point he did get frustrated with another group member not following the rules correctly, but overall managed this well and did not perseverate or become irate.  P: OT Group will be x1 per week while pt inpatient.  Zenovia Jarred, MSOT, OTR/L Behavioral Health OT/ Acute Relief OT PHP Office: [place] 01/12/2019, 8:18 PM

## 2019-01-13 LAB — GLUCOSE, CAPILLARY: Glucose-Capillary: 102 mg/dL — ABNORMAL HIGH (ref 70–99)

## 2019-01-13 MED ORDER — FLUVOXAMINE MALEATE 50 MG PO TABS
150.0000 mg | ORAL_TABLET | Freq: Every day | ORAL | Status: DC
  Administered 2019-01-13: 150 mg via ORAL
  Filled 2019-01-13 (×3): qty 3

## 2019-01-13 NOTE — Progress Notes (Signed)
Patient ID: Marc Long, male   DOB: 01-05-1976, 43 y.o.   MRN: [id number]  Nursing Progress Note [id number]  Patient presents calm and pleasant on approach. Patient compliant with scheduled medications. Patient is seen attending groups and visible in the milieu. Patient currently denies active SI/HI/AVH. Patient reports he is not getting along with his roommate at this time but reports he will direct concerns to staff if unable to appropriately address the situation.  Patient safety maintained with q15 min safety checks.   Patient remains safe on the unit at this time. Will continue to support and monitor with plan of care.   Patient's self-inventory sheet Rated Energy Level  Low  Rated Sleep  Good  Rated Appetite  Good  Rated Anxiety (0-10)  7  Rated Hopelessness (0-10)  8  Rated Depression (0-10)  0  Daily Goal  "get along with Francee Piccolo better, set boundaries and be more understanding"  Any Additional Comments:

## 2019-01-13 NOTE — B[facility] Group Notes (Signed)
[facility] LCSW Group Therapy Note  Date/Time: 01/13/2019 @ 1pm  Type of Therapy and Topic:  Group Therapy:  Overcoming Obstacles  Participation Level:  Active   Description of Group:    In this group patients will be encouraged to explore what they see as obstacles to their own wellness and recovery. They will be guided to discuss their thoughts, feelings, and behaviors related to these obstacles. The group will process together ways to cope with barriers, with attention given to specific choices patients can make. Each patient will be challenged to identify changes they are motivated to make in order to overcome their obstacles. This group will be process-oriented, with patients participating in exploration of their own experiences as well as giving and receiving support and challenge from other group members.  Therapeutic Goals: 1. Patient will identify personal and current obstacles as they relate to admission. 2. Patient will identify barriers that currently interfere with their wellness or overcoming obstacles.  3. Patient will identify feelings, thought process and behaviors related to these barriers. 4. Patient will identify two changes they are willing to make to overcome these obstacles:    Summary of Patient Progress   Patient was active and engaged throughout group. Patient was taken out of group for a few minutes for a survey. Patient was able to identify his current obstacle which is his pint up rage. Patient was able to identify his barrier that currently will interfere with overcoming his obstacle which is he often thinks about his actions that will take place if his rage does come out. Patient states that he will just explode. Patient struggled to identify how to overcome his obstacles. Patient stated that he is still working on it and feels that he just wants to feel and that the medications are a barrier.    Therapeutic Modalities:   Cognitive Behavioral Therapy Solution Focused  Therapy Motivational Interviewing Relapse Prevention Therapy   Ardelle Anton, LCSW

## 2019-01-13 NOTE — Progress Notes (Signed)
Recreation Therapy Notes  Date: 7.30.20 Time: 1000 Location: 500 Hall Day Room  Group Topic: Leisure Education, Biomedical scientist) Addresses:  Patient will identify positive leisure outlets. Patient will identify benefits of leisure.  Behavioral Response: Engaged  Intervention:  Markers, Electronics engineer, Web designer, Leisure Activities  Activity: [place].  Patient will pull a leisure activity from the container.  Patient will draw that activity on the board.  The remaining patients will attempt to guess what the picture is.  The person who guess the picture correctly, gets the next turn.  Education: Education officer, community, Environmental health practitioner, Discharge Planning    Education Outcome: Acknowledges education  Clinical Observations/Feedback: Pt was pleasant and active during group session.  Pt engaged well with peers and appeared to be having a good time.    Victorino Sparrow, LRT/CTRS         Victorino Sparrow A 01/13/2019 12:23 PM

## 2019-01-13 NOTE — Progress Notes (Signed)
Recreation Therapy Notes  Date: 7.30.20 Time: 1000 Location: 500 Hall Day Room  Group Topic: Leisure Education, Biomedical scientist) Addresses:  Patient will identify positive leisure outlets. Patient will identify benefits of leisure.  Intervention:  Markers, Electronics engineer, Web designer, Leisure Activities  Activity: [place].  Patient will pull a leisure activity from the container.  Patient will draw that activity on the board.  The remaining patients will attempt to guess what the picture is.  The person who guess the picture correctly, gets the next turn.  Education: Education officer, community, Environmental health practitioner, Discharge Planning    Education Outcome: Acknowledges education   Clinical Observations/Feedback: Pt did not attend group.     Victorino Sparrow, LRT/CTRS         Victorino Sparrow A 01/13/2019 12:39 PM

## 2019-01-13 NOTE — Progress Notes (Signed)
[facility] MD Progress Note  01/13/2019 7:54 AM Marc Long  MRN:  [id number] Subjective:  Spoke with the patient's therapist yesterday with his permission and at his request he feels that an act team may be the appropriate level of care given the patient's treatment resistant depression.  The patient himself is now alert oriented and cooperative affect is constricted he states is not prepared to go today he has had some emotional numbing on the fluvoxamine, at least we assume it is the fluvoxamine so we discussed lowering the dose as we have already escalated the Wellbutrin.  No thoughts of harming self contracting here Principal Problem: TRD Diagnosis: Active Problems:   Schizophrenia ([place])  Total Time spent with patient: 20 minutes  Past Psychiatric History: trd  Past Medical History:  Past Medical History:  Diagnosis Date  . Depression   . Diabetes mellitus without complication ([place])   . Gallstones   . Obesity, Class III, BMI 40-49.9 (morbid obesity) ([place])    History reviewed. No pertinent surgical history. Family History:  Family History  Problem Relation Age of Onset  . Hypertension Mother   . Depression Mother   . Heart attack Maternal Grandfather   . Lymphoma Maternal Grandmother   . Diabetes Maternal Aunt   . Cancer Maternal Aunt    Family Psychiatric  History: no new data Social History:  Social History   Substance and Sexual Activity  Alcohol Use Yes  . Alcohol/week: 0.0 standard drinks   Comment: once every 2 weeks. Wine coolers     Social History   Substance and Sexual Activity  Drug Use No    Social History   Socioeconomic History  . Marital status: Single    Spouse name: Not on file  . Number of children: 0  . Years of education: Not on file  . Highest education level: Not on file  Occupational History  . Occupation: call center  Social Needs  . Financial resource strain: Not on file  . Food insecurity    Worry: Not on file    Inability: Not  on file  . Transportation needs    Medical: Not on file    Non-medical: Not on file  Tobacco Use  . Smoking status: Never Smoker  . Smokeless tobacco: Never Used  Substance and Sexual Activity  . Alcohol use: Yes    Alcohol/week: 0.0 standard drinks    Comment: once every 2 weeks. Wine coolers  . Drug use: No  . Sexual activity: Not Currently    Birth control/protection: None  Lifestyle  . Physical activity    Days per week: Not on file    Minutes per session: Not on file  . Stress: Not on file  Relationships  . Social Herbalist on phone: Not on file    Gets together: Not on file    Attends religious service: Not on file    Active member of club or organization: Not on file    Attends meetings of clubs or organizations: Not on file    Relationship status: Not on file  Other Topics Concern  . Not on file  Social History Narrative  . Not on file   Additional Social History:    Pain Medications: See MAR Prescriptions: See MAR Over the Counter: See MAR History of alcohol / drug use?: No history of alcohol / drug abuse  Sleep: Good  Appetite:  Good  Current Medications: Current Facility-Administered Medications  Medication Dose Route Frequency Provider Last Rate Last Dose  . acetaminophen (TYLENOL) tablet 650 mg  650 mg Oral Q6H PRN Laverle Hobby, PA-C      . alum & mag hydroxide-simeth (MAALOX/MYLANTA) 200-200-20 MG/5ML suspension 30 mL  30 mL Oral Q4H PRN Laverle Hobby, PA-C      . buPROPion (WELLBUTRIN XL) 24 hr tablet 300 mg  300 mg Oral Daily Johnn Hai, MD      . busPIRone (BUSPAR) tablet 15 mg  15 mg Oral BID Cobos, Myer Peer, MD   15 mg at 01/12/19 1614  . fluvoxaMINE (LUVOX) tablet 150 mg  150 mg Oral QHS Johnn Hai, MD      . lisinopril (ZESTRIL) tablet 20 mg  20 mg Oral Daily Cobos, Myer Peer, MD   20 mg at 01/12/19 0801  . OLANZapine zydis (ZYPREXA) disintegrating tablet 10 mg  10 mg Oral Q8H PRN Laverle Hobby, PA-C   10 mg at 01/12/19 0801   And  . LORazepam (ATIVAN) tablet 1 mg  1 mg Oral PRN Patriciaann Clan E, PA-C       And  . ziprasidone (GEODON) injection 20 mg  20 mg Intramuscular PRN Patriciaann Clan E, PA-C      . magnesium hydroxide (MILK OF MAGNESIA) suspension 30 mL  30 mL Oral Daily PRN Laverle Hobby, PA-C      . metFORMIN (GLUCOPHAGE) tablet 1,000 mg  1,000 mg Oral BID WC Cobos, Myer Peer, MD   1,000 mg at 01/12/19 1615  . prenatal multivitamin tablet 1 tablet  1 tablet Oral Daily Johnn Hai, MD   1 tablet at 01/12/19 1614  . QUEtiapine (SEROQUEL) tablet 200 mg  200 mg Oral QHS Johnn Hai, MD   200 mg at 01/12/19 2120  . traZODone (DESYREL) tablet 50 mg  50 mg Oral QHS PRN,MR X 1 Laverle Hobby, PA-C   50 mg at 01/12/19 2121    Lab Results:  Results for orders placed or performed during the hospital encounter of 01/07/19 (from the past 48 hour(s))  Glucose, capillary     Status: Abnormal   Collection Time: 01/12/19  6:18 AM  Result Value Ref Range   Glucose-Capillary 104 (H) 70 - 99 mg/dL  Glucose, capillary     Status: Abnormal   Collection Time: 01/12/19  4:26 PM  Result Value Ref Range   Glucose-Capillary 139 (H) 70 - 99 mg/dL  Glucose, capillary     Status: Abnormal   Collection Time: 01/13/19  6:26 AM  Result Value Ref Range   Glucose-Capillary 102 (H) 70 - 99 mg/dL    Blood Alcohol level:  Lab Results  Component Value Date   ETH <10 04/06/2018   ETH <10 76/72/0947    Metabolic Disorder Labs: Lab Results  Component Value Date   HGBA1C 6.6 (H) 01/09/2019   MPG 142.72 01/09/2019   MPG 114.02 04/07/2018   Lab Results  Component Value Date   PROLACTIN 16.1 (H) 04/07/2018   Lab Results  Component Value Date   CHOL 170 01/09/2019   TRIG 179 (H) 01/09/2019   HDL 28 (L) 01/09/2019   CHOLHDL 6.1 01/09/2019   VLDL 36 01/09/2019   LDLCALC 106 (H) 01/09/2019   LDLCALC 104 (H) 04/07/2018    Physical Findings: AIMS: Facial and Oral  Movements Muscles of Facial Expression: None, normal Lips and Perioral Area: None, normal Jaw: None,  normal Tongue: None, normal,Extremity Movements Upper (arms, wrists, hands, fingers): None, normal Lower (legs, knees, ankles, toes): None, normal, Trunk Movements Neck, shoulders, hips: None, normal, Overall Severity Severity of abnormal movements (highest score from questions above): None, normal Incapacitation due to abnormal movements: None, normal Patient's awareness of abnormal movements (rate only patient's report): No Awareness, Dental Status Current problems with teeth and/or dentures?: No Does patient usually wear dentures?: No  CIWA:    COWS:     Musculoskeletal: Strength & Muscle Tone: within normal limits Gait & Station: normal Patient leans: N/A  Psychiatric Specialty Exam: Physical Exam  ROS  Blood pressure 138/87, pulse 98, temperature 98.7 F (37.1 C), temperature source Oral, resp. rate 18, SpO2 100 %.There is no height or weight on file to calculate BMI.  General Appearance: Casual  Eye Contact:  Good  Speech:  Clear and Coherent  Volume:  Decreased  Mood:  Dysphoric  Affect:  Congruent and Constricted  Thought Process:  Coherent and Descriptions of Associations: Circumstantial  Orientation:  Full (Time, Place, and Person)  Thought Content:  Rumination  Suicidal Thoughts:  Yes.  without intent/plan-vague  Homicidal Thoughts:  No  Memory:  Immediate;   Good  Judgement:  Good  Insight:  Good  Psychomotor Activity:  Normal  Concentration:  Concentration: Good  Recall:  Good  Fund of Knowledge:  Good  Language:  Good  Akathisia:  Negative  Handed:  Right  AIMS (if indicated):     Assets:  Leisure Time Physical Health Resilience  ADL's:  Intact  Cognition:  WNL  Sleep:  Number of Hours: 6.75     Treatment Plan Summary: Daily contact with patient to assess and evaluate symptoms and progress in treatment, Medication management and Plan Continue  current antidepressant therapy decreased fluvoxamine continue Wellbutrin continue BuSpar continue augmentation strategies continue cognitive therapyJohnn Hai, MD 01/13/2019, 7:54 AM

## 2019-01-13 NOTE — Progress Notes (Signed)
The focus of this group is to help patients establish daily goals to achieve during treatment and discuss how the patient can incorporate goal setting into their daily lives to aide in recovery. 

## 2019-01-13 NOTE — Plan of Care (Signed)
  Problem: Education: Goal: Knowledge of [facility] Education information/materials will improve Outcome: Progressing Goal: Verbalization of understanding the information provided will improve Outcome: Progressing   Problem: Activity: Goal: Interest or engagement in activities will improve Outcome: Progressing   Problem: Health Behavior/Discharge Planning: Goal: Compliance with treatment plan for underlying cause of condition will improve Outcome: Progressing   Problem: Safety: Goal: Periods of time without injury will increase Outcome: Progressing

## 2019-01-14 LAB — GLUCOSE, CAPILLARY: Glucose-Capillary: 100 mg/dL — ABNORMAL HIGH (ref 70–99)

## 2019-01-14 MED ORDER — FLUVOXAMINE MALEATE 50 MG PO TABS: 150.0000 mg | ORAL_TABLET | Freq: Every day | ORAL | 1 refills | Status: DC

## 2019-01-14 MED ORDER — QUETIAPINE FUMARATE 200 MG PO TABS: 200.0000 mg | ORAL_TABLET | Freq: Every day | ORAL | 1 refills | Status: DC

## 2019-01-14 MED ORDER — BUPROPION HCL ER (XL) 300 MG PO TB24: 300.0000 mg | ORAL_TABLET | Freq: Every day | ORAL | 1 refills | Status: AC

## 2019-01-14 NOTE — Plan of Care (Signed)
Pt was able to identify coping skills at completion of recreation therapy group sessions.   Dinna Severs, LRT/CTRS 

## 2019-01-14 NOTE — Progress Notes (Signed)
Pt denies SI/HI. Pt received both written and verbal discharge instructions. Pt verbalized understanding of discharge instructions. Pt agreed to f/u appt and med regimen. Pt received prescriptions, SRA, AVS, transitional record and belongings from locker. Pt safely discharged to the lobby and picked up by his mother.

## 2019-01-14 NOTE — Tx Team (Signed)
Interdisciplinary Treatment and Diagnostic Plan Update  01/14/2019 Time of Session: 09:18am Marc Long MRN: [id number]  Principal Diagnosis: <principal problem not specified>  Secondary Diagnoses: Active Problems:   Schizophrenia ([place])   Current Medications:  Current Facility-Administered Medications  Medication Dose Route Frequency Provider Last Rate Last Dose  . acetaminophen (TYLENOL) tablet 650 mg  650 mg Oral Q6H PRN Laverle Hobby, PA-C      . alum & mag hydroxide-simeth (MAALOX/MYLANTA) 200-200-20 MG/5ML suspension 30 mL  30 mL Oral Q4H PRN Laverle Hobby, PA-C      . buPROPion (WELLBUTRIN XL) 24 hr tablet 300 mg  300 mg Oral Daily Johnn Hai, MD   300 mg at 01/14/19 0919  . busPIRone (BUSPAR) tablet 15 mg  15 mg Oral BID Cobos, Myer Peer, MD   15 mg at 01/14/19 0919  . fluvoxaMINE (LUVOX) tablet 150 mg  150 mg Oral QHS Johnn Hai, MD   150 mg at 01/13/19 2104  . lisinopril (ZESTRIL) tablet 20 mg  20 mg Oral Daily Cobos, Myer Peer, MD   20 mg at 01/14/19 0920  . magnesium hydroxide (MILK OF MAGNESIA) suspension 30 mL  30 mL Oral Daily PRN Patriciaann Clan E, PA-C      . metFORMIN (GLUCOPHAGE) tablet 1,000 mg  1,000 mg Oral BID WC Cobos, Myer Peer, MD   1,000 mg at 01/14/19 0919  . OLANZapine zydis (ZYPREXA) disintegrating tablet 10 mg  10 mg Oral Q8H PRN Laverle Hobby, PA-C   10 mg at 01/13/19 1419   And  . ziprasidone (GEODON) injection 20 mg  20 mg Intramuscular PRN Laverle Hobby, PA-C      . prenatal multivitamin tablet 1 tablet  1 tablet Oral Daily Johnn Hai, MD   1 tablet at 01/14/19 0919  . QUEtiapine (SEROQUEL) tablet 200 mg  200 mg Oral QHS Johnn Hai, MD   200 mg at 01/13/19 2103  . traZODone (DESYREL) tablet 50 mg  50 mg Oral QHS PRN,MR X 1 Laverle Hobby, PA-C   50 mg at 01/13/19 2103   PTA Medications: Medications Prior to Admission  Medication Sig Dispense Refill Last Dose  . aspirin EC 81 MG tablet Take 81 mg by mouth daily.     .  Multiple Vitamin (MULTIVITAMIN WITH MINERALS) TABS tablet Take 1 tablet by mouth daily.     . busPIRone (BUSPAR) 15 MG tablet Take 15 mg by mouth 3 (three) times daily.     . fluvoxaMINE (LUVOX) 100 MG tablet Take 1 tablet (100 mg total) by mouth 2 (two) times daily. For depression 60 tablet 0   . hydrOXYzine (ATARAX/VISTARIL) 50 MG tablet Take 1 tablet (50 mg total) by mouth every 6 (six) hours as needed for anxiety. 60 tablet 0   . lisinopril (ZESTRIL) 20 MG tablet Take 20 mg by mouth daily.      . metFORMIN (GLUCOPHAGE) 1000 MG tablet Take 1 tablet (1,000 mg total) by mouth 2 (two) times daily with a meal. For diabetes management 60 tablet 0   . pantoprazole (PROTONIX) 40 MG tablet Take 1 tablet (40 mg total) by mouth daily. For acid reflux (Patient not taking: Reported on 01/08/2019) 15 tablet 0 Not Taking at Unknown time  . QUEtiapine (SEROQUEL) 50 MG tablet Take 3 tablets (150 mg total) by mouth at bedtime. For mood control 90 tablet 0   . traZODone (DESYREL) 50 MG tablet Take 1 tablet (50 mg total) by mouth at bedtime as  needed for sleep. 30 tablet 0     Patient Stressors: Financial difficulties  Patient Strengths: Ability for insight Active sense of humor  Treatment Modalities: Medication Management, Group therapy, Case management,  1 to 1 session with clinician, Psychoeducation, Recreational therapy.   Physician Treatment Plan for Primary Diagnosis: <principal problem not specified> Long Term Goal(s): Improvement in symptoms so as ready for discharge Improvement in symptoms so as ready for discharge   Short Term Goals: Ability to identify changes in lifestyle to reduce recurrence of condition will improve Ability to verbalize feelings will improve Ability to disclose and discuss suicidal ideas Ability to demonstrate self-control will improve Ability to identify and develop effective coping behaviors will improve Ability to maintain clinical measurements within normal limits will  improve Compliance with prescribed medications will improve Ability to identify changes in lifestyle to reduce recurrence of condition will improve Ability to verbalize feelings will improve Ability to disclose and discuss suicidal ideas Ability to demonstrate self-control will improve Ability to identify and develop effective coping behaviors will improve Ability to maintain clinical measurements within normal limits will improve  Medication Management: Evaluate patient's response, side effects, and tolerance of medication regimen.  Therapeutic Interventions: 1 to 1 sessions, Unit Group sessions and Medication administration.  Evaluation of Outcomes: Adequate for Discharge  Physician Treatment Plan for Secondary Diagnosis: Active Problems:   Schizophrenia ([place])  Long Term Goal(s): Improvement in symptoms so as ready for discharge Improvement in symptoms so as ready for discharge   Short Term Goals: Ability to identify changes in lifestyle to reduce recurrence of condition will improve Ability to verbalize feelings will improve Ability to disclose and discuss suicidal ideas Ability to demonstrate self-control will improve Ability to identify and develop effective coping behaviors will improve Ability to maintain clinical measurements within normal limits will improve Compliance with prescribed medications will improve Ability to identify changes in lifestyle to reduce recurrence of condition will improve Ability to verbalize feelings will improve Ability to disclose and discuss suicidal ideas Ability to demonstrate self-control will improve Ability to identify and develop effective coping behaviors will improve Ability to maintain clinical measurements within normal limits will improve     Medication Management: Evaluate patient's response, side effects, and tolerance of medication regimen.  Therapeutic Interventions: 1 to 1 sessions, Unit Group sessions and Medication  administration.  Evaluation of Outcomes: Adequate for Discharge   RN Treatment Plan for Primary Diagnosis: <principal problem not specified> Long Term Goal(s): Knowledge of disease and therapeutic regimen to maintain health will improve  Short Term Goals: Ability to participate in decision making will improve, Ability to verbalize feelings will improve, Ability to disclose and discuss suicidal ideas, Ability to identify and develop effective coping behaviors will improve and Compliance with prescribed medications will improve  Medication Management: RN will administer medications as ordered by provider, will assess and evaluate patient's response and provide education to patient for prescribed medication. RN will report any adverse and/or side effects to prescribing provider.  Therapeutic Interventions: 1 on 1 counseling sessions, Psychoeducation, Medication administration, Evaluate responses to treatment, Monitor vital signs and CBGs as ordered, Perform/monitor CIWA, COWS, AIMS and Fall Risk screenings as ordered, Perform wound care treatments as ordered.  Evaluation of Outcomes: Adequate for Discharge   LCSW Treatment Plan for Primary Diagnosis: <principal problem not specified> Long Term Goal(s): Safe transition to appropriate next level of care at discharge, Engage patient in therapeutic group addressing interpersonal concerns.  Short Term Goals: Engage patient in aftercare planning with  referrals and resources and Increase skills for wellness and recovery  Therapeutic Interventions: Assess for all discharge needs, 1 to 1 time with Social worker, Explore available resources and support systems, Assess for adequacy in community support network, Educate family and significant other(s) on suicide prevention, Complete Psychosocial Assessment, Interpersonal group therapy.  Evaluation of Outcomes: Adequate for Discharge   Progress in Treatment: Attending groups: Yes. Participating in  groups: Yes. Taking medication as prescribed: Yes. Toleration medication: Yes. Family/Significant other contact made: Yes, individual(s) contacted:  pt's mother Patient understands diagnosis: Yes. Discussing patient identified problems/goals with staff: Yes. Medical problems stabilized or resolved: Yes. Denies suicidal/homicidal ideation: Yes. Issues/concerns per patient self-inventory: No. Other:    New problem(s) identified: No, Describe:  None  New Short Term/Long Term Goal(s): Medication stabilization, elimination of SI thoughts, and development of a comprehensive mental wellness plan.   Patient Goals:    Discharge Plan or Barriers: Patient is being discharged home. Patient has a referral for an ACTT team with [facility] and will continue with therapy.  Reason for Continuation of Hospitalization: Patient is discharging today.   Estimated Length of Stay: Patient is discharging today.   Attendees: Patient: 01/14/2019   Physician: Dr. Johnn Hai, MD 01/14/2019   Nursing: Sharl Ma, RN 01/14/2019  RN Care Manager: 01/14/2019   Social Worker: Ardelle Anton, LCSW 01/14/2019   Recreational Therapist:  01/14/2019  Other:  01/14/2019  Other:  01/14/2019  Other: 01/14/2019      Scribe for Treatment Team: Trecia Rogers, LCSW 01/14/2019 11:37 AM

## 2019-01-14 NOTE — [facility] Suicide Risk Assessment (Signed)
[facility] Discharge Suicide Risk Assessment   Principal Problem: Chronic treatment resistant depression discharge Diagnoses: Active Problems:   Schizophrenia ([place])   Total Time spent with patient: 45 minutes  Musculoskeletal: Strength & Muscle Tone: within normal limits Gait & Station: normal Patient leans: N/A  Psychiatric Specialty Exam: ROS  Blood pressure 113/75, pulse 91, temperature 97.9 F (36.6 C), temperature source Oral, resp. rate 18, SpO2 100 %.There is no height or weight on file to calculate BMI.  General Appearance: Casual  Eye Contact::  Good  Speech:  Clear and Coherent409  Volume:  Normal  Mood:  Euthymic  Affect:  Full Range  Thought Process:  Coherent and Descriptions of Associations: Intact  Orientation:  Full (Time, Place, and Person)  Thought Content:  Tangential  Suicidal Thoughts:  No  Homicidal Thoughts:  No  Memory:  NA Immediate;   Good  Judgement:  Intact  Insight:  Fair  Psychomotor Activity:  Normal  Concentration:  Good  Recall:  Good  Fund of Knowledge:Good  Language: Good  Akathisia:  Negative  Handed:  Right  AIMS (if indicated):     Assets:  Communication Skills Desire for Improvement  Sleep:  Number of Hours: 5.75  Cognition: WNL  ADL's:  Intact   Mental Status Per Nursing Assessment::   On Admission:  Suicidal ideation indicated by patient  Demographic Factors:  Male  Loss Factors: Decrease in vocational status  Historical Factors: NA  Risk Reduction Factors:   Employed as possibility  Continued Clinical Symptoms:  Dysthymia  Cognitive Features That Contribute To Risk:  None    Suicide Risk:  Minimal: No identifiable suicidal ideation.  Patients presenting with no risk factors but with morbid ruminations; may be classified as minimal risk based on the severity of the depressive symptoms  Follow-up Information    [facility] [place] Follow up.   Why: Primary Therapist  Contact  information: [address] [place], [place] [address] P: [phone] F: [place], [place] Follow up on 02/02/2019.   Why: Community Support Team Referral is pending. You have an interview for CST on 02/02/2019 Contact information: [address] [place] [address] [phone]        [facility] Follow up.   Contact information: [place] [address] [phone]           Plan Of Care/Follow-up recommendations:  Activity:  full  Severus Brodzinski, MD 01/14/2019, 8:14 AM

## 2019-01-14 NOTE — Progress Notes (Signed)
Recreation Therapy Notes  INPATIENT RECREATION TR PLAN  Patient Details Name: Marc Long MRN: [id number] DOB: [birth year] Today's Date: 01/14/2019  Rec Therapy Plan Is patient appropriate for Therapeutic Recreation?: Yes Treatment times per week: about 3 days Estimated Length of Stay: 5-7 days TR Treatment/Interventions: Group participation (Comment)  Discharge Criteria Pt will be discharged from therapy if:: Discharged Treatment plan/goals/alternatives discussed and agreed upon by:: Patient/family  Discharge Summary Short term goals set: See patient care plan Short term goals met: Adequate for discharge Progress toward goals comments: Groups attended Which groups?: Leisure education, Goal setting, Other (Comment)(Team building; Triggers) Reason goals not met: None Therapeutic equipment acquired: N/A Reason patient discharged from therapy: Discharge from hospital Pt/family agrees with progress & goals achieved: Yes Date patient discharged from therapy: 01/14/19   Victorino Sparrow, LRT/CTRS  Ria Comment, Maribell Demeo A 01/14/2019, 11:42 AM

## 2019-01-14 NOTE — Discharge Summary (Signed)
Physician Discharge Summary Note  Patient:  Marc Long is an 43 y.o., male MRN:  [id number] DOB:  [birth year] Patient phone:  [phone] (home)  Patient address:   [place] [address],  Total Time spent with patient: 45 minutes  Date of Admission:  01/07/2019 Date of Discharge: 01/14/2019  Reason for Admission:   History of Present Illness:43 year old male, known to our unit from prior admissions in 2019. He states he has had increased depression/sadness recently, particularly over the past few weeks. Does not identify any specific triggers or stressors. Reports " it's like I don't see the point of life anymore". He has had thoughts of slitting his throat with a knife . Of note , he reports history of intermittent visual imagery ( not hallucinations) of him committing suicide, but states this has improved significantly with medication management he is on .  Endorses some neuro-vegetative symptoms as below. Denies psychotic symptoms.  He also reports that he struggles with a sense of low self esteem , depression due to having intermittent sexual fantasies of rape, which " makes me feel like I am a bad person". He states he does not act out on these thoughts, and denies any plan or intention of actually hurting anyone. Principal Problem: Treatment resistant depression Discharge Diagnoses: Active Problems:   Schizophrenia [facility])   Past Psychiatric History: exstensive  Past Medical History:  Past Medical History:  Diagnosis Date  . Depression   . Diabetes mellitus without complication ([place])   . Gallstones   . Obesity, Class III, BMI 40-49.9 (morbid obesity) ([place])    History reviewed. No pertinent surgical history. Family History:  Family History  Problem Relation Age of Onset  . Hypertension Mother   . Depression Mother   . Heart attack Maternal Grandfather   . Lymphoma Maternal Grandmother   . Diabetes Maternal Aunt   . Cancer Maternal Aunt     Family Psychiatric  History: see eval Social History:  Social History   Substance and Sexual Activity  Alcohol Use Yes  . Alcohol/week: 0.0 standard drinks   Comment: once every 2 weeks. Wine coolers     Social History   Substance and Sexual Activity  Drug Use No    Social History   Socioeconomic History  . Marital status: Single    Spouse name: Not on file  . Number of children: 0  . Years of education: Not on file  . Highest education level: Not on file  Occupational History  . Occupation: call center  Social Needs  . Financial resource strain: Not on file  . Food insecurity    Worry: Not on file    Inability: Not on file  . Transportation needs    Medical: Not on file    Non-medical: Not on file  Tobacco Use  . Smoking status: Never Smoker  . Smokeless tobacco: Never Used  Substance and Sexual Activity  . Alcohol use: Yes    Alcohol/week: 0.0 standard drinks    Comment: once every 2 weeks. Wine coolers  . Drug use: No  . Sexual activity: Not Currently    Birth control/protection: None  Lifestyle  . Physical activity    Days per week: Not on file    Minutes per session: Not on file  . Stress: Not on file  Relationships  . Social Herbalist on phone: Not on file    Gets together: Not on file  Attends religious service: Not on file    Active member of club or organization: Not on file    Attends meetings of clubs or organizations: Not on file    Relationship status: Not on file  Other Topics Concern  . Not on file  Social History Narrative  . Not on file    Hospital Course:    As discussed patient is known to have a chronic rather treatment resistant depressive illness with chronic self-harm gestures and behaviors.  We continue Luvox but lowered the dose because he complained of some degree of emotional numbing we augmented with the continuation of BuSpar and the addition of bupropion At his request we kept in touch with his therapist  who recommended possible act team given the patient's chronicity.  By the date of the 31st he was alert and oriented and cooperative affect a little constricted but no thoughts of harming self and fully contract and believe the medications were helpful thus far.  Physical Findings: AIMS: Facial and Oral Movements Muscles of Facial Expression: None, normal Lips and Perioral Area: None, normal Jaw: None, normal Tongue: None, normal,Extremity Movements Upper (arms, wrists, hands, fingers): None, normal Lower (legs, knees, ankles, toes): None, normal, Trunk Movements Neck, shoulders, hips: None, normal, Overall Severity Severity of abnormal movements (highest score from questions above): None, normal Incapacitation due to abnormal movements: None, normal Patient's awareness of abnormal movements (rate only patient's report): No Awareness, Dental Status Current problems with teeth and/or dentures?: No Does patient usually wear dentures?: No  CIWA:    COWS:     Musculoskeletal: Strength & Muscle Tone: within normal limits Gait & Station: normal Patient leans: N/A  Psychiatric Specialty Exam: ROS  Blood pressure 113/75, pulse 91, temperature 97.9 F (36.6 C), temperature source Oral, resp. rate 18, SpO2 100 %.There is no height or weight on file to calculate BMI.  General Appearance: Casual  Eye Contact::  Good  Speech:  Clear and [id number]  Volume:  Normal  Mood:  Euthymic  Affect:  Full Range  Thought Process:  Coherent and Descriptions of Associations: Intact  Orientation:  Full (Time, Place, and Person)  Thought Content:  Tangential  Suicidal Thoughts:  No  Homicidal Thoughts:  No  Memory:  NA Immediate;   Good  Judgement:  Intact  Insight:  Fair  Psychomotor Activity:  Normal  Concentration:  Good  Recall:  Good  Fund of Knowledge:Good  Language: Good  Akathisia:  Negative  Handed:  Right  AIMS (if indicated):     Assets:  Communication Skills Desire for  Improvement  Sleep:  Number of Hours: 5.75  Cognition: WNL  ADL's:  Intact         Has this patient used any form of tobacco in the last 30 days? (Cigarettes, Smokeless Tobacco, Cigars, and/or Pipes) Yes, No  Blood Alcohol level:  Lab Results  Component Value Date   ETH <10 04/06/2018   ETH <10 70/96/2836    Metabolic Disorder Labs:  Lab Results  Component Value Date   HGBA1C 6.6 (H) 01/09/2019   MPG 142.72 01/09/2019   MPG 114.02 04/07/2018   Lab Results  Component Value Date   PROLACTIN 16.1 (H) 04/07/2018   Lab Results  Component Value Date   CHOL 170 01/09/2019   TRIG 179 (H) 01/09/2019   HDL 28 (L) 01/09/2019   CHOLHDL 6.1 01/09/2019   VLDL 36 01/09/2019   LDLCALC 106 (H) 01/09/2019   LDLCALC 104 (H) 04/07/2018  See Psychiatric Specialty Exam and Suicide Risk Assessment completed by Attending Physician prior to discharge.  Discharge destination:  Home  Is patient on multiple antipsychotic therapies at discharge:  No   Has Patient had three or more failed trials of antipsychotic monotherapy by history:  No  Recommended Plan for Multiple Antipsychotic Therapies: NA   Allergies as of 01/14/2019      Reactions   Fish Allergy Nausea Only   Bee Venom Swelling   Keflex [cephalexin]    UPSET STOMACH   Relafen [nabumetone]    Blood in stool   Shellfish Allergy Nausea Only      Medication List    TAKE these medications     Indication  aspirin EC 81 MG tablet Take 81 mg by mouth daily.  Indication: Pain   buPROPion 300 MG 24 hr tablet Commonly known as: WELLBUTRIN XL Take 1 tablet (300 mg total) by mouth daily.  Indication: Major Depressive Disorder   busPIRone 15 MG tablet Commonly known as: BUSPAR Take 15 mg by mouth 3 (three) times daily.  Indication: Major Depressive Disorder   fluvoxaMINE 50 MG tablet Commonly known as: LUVOX Take 3 tablets (150 mg total) by mouth at bedtime. What changed:   medication strength  how much to  take  when to take this  additional instructions  Indication: Major Depressive Disorder   hydrOXYzine 50 MG tablet Commonly known as: ATARAX/VISTARIL Take 1 tablet (50 mg total) by mouth every 6 (six) hours as needed for anxiety.  Indication: Feeling Anxious   lisinopril 20 MG tablet Commonly known as: ZESTRIL Take 20 mg by mouth daily.  Indication: High Blood Pressure Disorder   metFORMIN 1000 MG tablet Commonly known as: GLUCOPHAGE Take 1 tablet (1,000 mg total) by mouth 2 (two) times daily with a meal. For diabetes management  Indication: Type 2 Diabetes   multivitamin with minerals Tabs tablet Take 1 tablet by mouth daily.  Indication: 21-Hydroxylase Deficiency   pantoprazole 40 MG tablet Commonly known as: PROTONIX Take 1 tablet (40 mg total) by mouth daily. For acid reflux  Indication: Stomach Ulcer, Gastroesophageal Reflux Disease   QUEtiapine 200 MG tablet Commonly known as: SEROQUEL Take 1 tablet (200 mg total) by mouth at bedtime. What changed:   medication strength  how much to take  additional instructions  Indication: Depressive Phase of Manic-Depression   traZODone 50 MG tablet Commonly known as: DESYREL Take 1 tablet (50 mg total) by mouth at bedtime as needed for sleep.  Indication: Trouble Sleeping      Follow-up Information    [facility] Follow up.   Why: Your therapist Legrand Como will meet with you after discharge. Contact information: [address], [place], [place] [address] P: [phone] F: [place], [place] Follow up on 02/02/2019.   Why: Community Support Team Referral is pending. You have an interview for CST on 02/02/2019 Contact information: [address] [place] [address] [phone]        [facility] Follow up.   Why: A referral for ACTT services has been made. Please follow up and speak with [facility] about your referral.   Contact information: [place] [address] [phone]           Signed: Johnn Hai, MD 01/14/2019, 10:56 AM

## 2019-01-14 NOTE — Progress Notes (Signed)
Patient ID: Marc Long, male   DOB: 1976-04-05, 43 y.o.   MRN: [id number] D: Patient in dayroom on approach. Pt reports his day was not great but is getting better. Pt reports feeling very anxious but states the medications given helped. Pt reports he is tolerating medication well. Pt denies SI/HI/AVH and pain. Pt attended and participated in evening wrap up group. Cooperative with assessment. No acute distressed noted at this time.   A: Medications administered as prescribed. Support and encouragement provided.  Pt encouraged to discuss feelings and come to staff with any question or concerns.   R: Patient remains safe and complaint with medications.

## 2019-01-14 NOTE — Progress Notes (Signed)
  [facility] Adult Case Management Discharge Plan :  Will you be returning to the same living situation after discharge:  Yes,  apartment At discharge, do you have transportation home?: Yes,  pt's mother Do you have the ability to pay for your medications: Yes,  medicaid  Release of information consent forms completed and in the chart;  Patient's signature needed at discharge.  Patient to Follow up at: Follow-up Information    [facility] Follow up.   Why: Your therapist Legrand Como will meet with you after discharge. Contact information: [address], [place], [place] [address] P: [phone] F: [place], [place] Follow up on 02/02/2019.   Why: Community Support Team Referral is pending. You have an interview for CST on 02/02/2019 Contact information: [address] [place] [address] [phone]        [facility] Follow up.   Why: A referral for ACTT services has been made. Please follow up and speak with [facility] about your referral.  Contact information: [place] [address] [phone]           Next level of care provider has access to [place] and Suicide Prevention discussed: Yes,  pt's mother     Has patient been referred to the Quitline?: N/A patient is not a smoker  Patient has been referred for addiction treatment: Yes  Trecia Rogers, LCSW 01/14/2019, 10:47 AM

## 2019-01-14 NOTE — Progress Notes (Signed)
Recreation Therapy Notes  Date: 7.31.20 Time: 1000 Location: 500 Hall Dayroom  Group Topic: Goal Setting  Goal Area(s) Addresses:  Patient will be able to identify at least 3 life goals.  Patient will be able to identify benefit of investing in life goals.  Patient will be able to identify benefit of setting life goals.   Behavioral Response:  Engaged  Intervention: Worksheet  Activity: Lobbyist.  Patients were to set goals for the week, month, year and five years.  Patients were to also identify any obstacles preventing from reaching their goals, what they need to achieve their goals and what they can start doing now to work towards their goals.  Education:  Discharge Planning, Radiographer, therapeutic, Leisure Education   Education Outcome: Acknowledges Education/In Group Clarification Provided/Needs Additional Education  Clinical Observations: Pt was appropriate and completed his sheet.    Victorino Sparrow, LRT/CTRS    Ria Comment, Hasini Peachey A 01/14/2019 11:00 AM

## 2019-03-22 ENCOUNTER — Ambulatory Visit (INDEPENDENT_AMBULATORY_CARE_PROVIDER_SITE_OTHER): Payer: Medicaid Other | Admitting: Cardiology

## 2019-03-22 ENCOUNTER — Other Ambulatory Visit: Payer: Self-pay

## 2019-03-22 ENCOUNTER — Encounter: Payer: Self-pay | Admitting: Cardiology

## 2019-03-22 VITALS — BP 120/80 | HR 84 | Ht 73.0 in | Wt 291.0 lb

## 2019-03-22 DIAGNOSIS — E669 Obesity, unspecified: Secondary | ICD-10-CM

## 2019-03-22 DIAGNOSIS — E119 Type 2 diabetes mellitus without complications: Secondary | ICD-10-CM

## 2019-03-22 DIAGNOSIS — E781 Pure hyperglyceridemia: Secondary | ICD-10-CM | POA: Diagnosis not present

## 2019-03-22 DIAGNOSIS — E088 Diabetes mellitus due to underlying condition with unspecified complications: Secondary | ICD-10-CM

## 2019-03-22 NOTE — Addendum Note (Signed)
Addended by: Beckey Rutter on: 03/22/2019 03:51 PM   Modules accepted: Orders

## 2019-03-22 NOTE — Progress Notes (Signed)
Cardiology Office Note:    Date:  03/22/2019   ID:  Marc Long, DOB 01/20/1976, MRN [id number]  PCP:  Vassie Moment, MD  Cardiologist:  Jenean Lindau, MD   Referring MD: Vassie Moment, MD    ASSESSMENT:    1. Type 2 diabetes mellitus without complication, without long-term current use of insulin ([place])   2. Diabetes mellitus due to underlying condition with unspecified complications ([place])   3. Hypertriglyceridemia   4. Obesity (BMI 30-39.9)    PLAN:    In order of problems listed above:  1. Essential hypertension: I discussed my findings with the patient at extensive length.  His blood pressure stable.  Diet was discussed.  I encouraged him to walk at least half an hour a day on a regular basis and he tells me that he is going to try to do so.  He will have a Chem-7 today as he is on multiple medications.  His lipids etc. are followed by his primary care physician. 2. Patient will be seen in follow-up appointment in 6 months or earlier if the patient has any concerns    Medication Adjustments/Labs and Tests Ordered: Current medicines are reviewed at length with the patient today.  Concerns regarding medicines are outlined above.  No orders of the defined types were placed in this encounter.  No orders of the defined types were placed in this encounter.    Chief Complaint  Patient presents with  . Follow-up     History of Present Illness:    Marc Long is a 43 y.o. male.  Patient has past medical history of essential hypertension.  He denies any problems at this time and takes care of activities of daily living.  No chest pain orthopnea or PND.  He mentions to me that he has depression and does not have the motivation to exercise.  At the time of my evaluation, the patient is alert awake oriented and in no distress.  He is here for a routine follow-up visit.  Past Medical History:  Diagnosis Date  . Depression   . Diabetes mellitus without  complication ([place])   . Gallstones   . Obesity, Class III, BMI 40-49.9 (morbid obesity) ([place])     No past surgical history on file.  Current Medications: Current Meds  Medication Sig  . aspirin EC 81 MG tablet Take 81 mg by mouth daily.  [place] Kitchen buPROPion (WELLBUTRIN XL) 300 MG 24 hr tablet Take 1 tablet (300 mg total) by mouth daily.  . busPIRone (BUSPAR) 15 MG tablet Take 15 mg by mouth 3 (three) times daily.  . fluvoxaMINE (LUVOX) 50 MG tablet Take 3 tablets (150 mg total) by mouth at bedtime.  . hydrOXYzine (ATARAX/VISTARIL) 50 MG tablet Take 1 tablet (50 mg total) by mouth every 6 (six) hours as needed for anxiety.  Marland Kitchen lisinopril (ZESTRIL) 20 MG tablet Take 20 mg by mouth daily.   . metFORMIN (GLUCOPHAGE) 1000 MG tablet Take 1 tablet (1,000 mg total) by mouth 2 (two) times daily with a meal. For diabetes management  . Multiple Vitamin (MULTIVITAMIN WITH MINERALS) TABS tablet Take 1 tablet by mouth daily.  . QUEtiapine (SEROQUEL) 200 MG tablet Take 1 tablet (200 mg total) by mouth at bedtime.  . traZODone (DESYREL) 50 MG tablet Take 1 tablet (50 mg total) by mouth at bedtime as needed for sleep.     Allergies:   Fish allergy, Bee venom, Keflex [cephalexin], Relafen [nabumetone], and Shellfish allergy  Social History   Socioeconomic History  . Marital status: Single    Spouse name: Not on file  . Number of children: 0  . Years of education: Not on file  . Highest education level: Not on file  Occupational History  . Occupation: call center  Social Needs  . Financial resource strain: Not on file  . Food insecurity    Worry: Not on file    Inability: Not on file  . Transportation needs    Medical: Not on file    Non-medical: Not on file  Tobacco Use  . Smoking status: Never Smoker  . Smokeless tobacco: Never Used  Substance and Sexual Activity  . Alcohol use: Yes    Alcohol/week: 0.0 standard drinks    Comment: once every 2 weeks. Wine coolers  . Drug use: No  . Sexual  activity: Not Currently    Birth control/protection: None  Lifestyle  . Physical activity    Days per week: Not on file    Minutes per session: Not on file  . Stress: Not on file  Relationships  . Social Herbalist on phone: Not on file    Gets together: Not on file    Attends religious service: Not on file    Active member of club or organization: Not on file    Attends meetings of clubs or organizations: Not on file    Relationship status: Not on file  Other Topics Concern  . Not on file  Social History Narrative  . Not on file     Family History: The patient's family history includes Cancer in his maternal aunt; Depression in his mother; Diabetes in his maternal aunt; Heart attack in his maternal grandfather; Hypertension in his mother; Lymphoma in his maternal grandmother.  ROS:   Please see the history of present illness.    All other systems reviewed and are negative.  EKGs/Labs/Other Studies Reviewed:    The following studies were reviewed today: IMPRESSIONS    1. The left ventricle has low normal systolic function, with an ejection fraction of 50-55%. The cavity size was mildly dilated. There is mildly increased left ventricular wall thickness. Left ventricular diastolic Doppler parameters are consistent with  impaired relaxation.  2. The right ventricle has normal systolic function. The cavity was normal.  3. The mitral valve is grossly normal.  4. The tricuspid valve is grossly normal.  5. The aortic valve is tricuspid. Mild thickening of the aortic valve. No stenosis of the aortic valve.  6. Hypokinesis of the inferolateral wall with overall low normal LV systolic function; mild diastolic dysfunction; mild LVE and LVH.   Study Highlights   Nuclear stress EF: 43%. The left ventricular ejection fraction is moderately decreased (30-44%).  There was no ST segment deviation noted during stress.  Defect 1: There is a medium defect of mild severity  present in the basal inferoseptal, basal inferior, mid inferoseptal and apical septal location.  This is an intermediate risk study. There is a fixed defect in the inferoseptal region that is most likely due to diaphragm. There is no ischemia. The LV function is moderately depressed.     Recent Labs: 04/06/2018: ALT 23; BUN 13; Creatinine, Ser 1.12; Hemoglobin 13.7; Platelets 303; Potassium 3.7; Sodium 140 04/07/2018: TSH 2.847  Recent Lipid Panel    Component Value Date/Time   CHOL 170 01/09/2019 0642   CHOL 142 03/05/2017 0925   TRIG 179 (H) 01/09/2019 0642   HDL 28 (  L) 01/09/2019 0642   HDL 27 (L) 03/05/2017 0925   CHOLHDL 6.1 01/09/2019 0642   VLDL 36 01/09/2019 0642   LDLCALC 106 (H) 01/09/2019 0642   LDLCALC 85 03/05/2017 0925    Physical Exam:    VS:  BP 120/80 (BP Location: Right Arm, Patient Position: Sitting, Cuff Size: Large)   Pulse 84   Ht 6\' 1"  (1.854 m)   Wt 291 lb (132 kg)   SpO2 96%   BMI 38.39 kg/m     Wt Readings from Last 3 Encounters:  03/22/19 291 lb (132 kg)  11/19/18 280 lb (127 kg)  10/29/18 281 lb (127.5 kg)     GEN: Patient is in no acute distress HEENT: Normal NECK: No JVD; No carotid bruits LYMPHATICS: No lymphadenopathy CARDIAC: Hear sounds regular, 2/6 systolic murmur at the apex. RESPIRATORY:  Clear to auscultation without rales, wheezing or rhonchi  ABDOMEN: Soft, non-tender, non-distended MUSCULOSKELETAL:  No edema; No deformity  SKIN: Warm and dry NEUROLOGIC:  Alert and oriented x 3 PSYCHIATRIC:  Normal affect   Signed, Jenean Lindau, MD  03/22/2019 3:43 PM    [facility]

## 2019-03-22 NOTE — Patient Instructions (Signed)
Medication Instructions:  Your physician recommends that you continue on your current medications as directed. Please refer to the Current Medication list given to you today.  If you need a refill on your cardiac medications before your next appointment, please call your pharmacy.   Lab work: Your physician recommends that you have a BMP drawn today.  If you have labs (blood work) drawn today and your tests are completely normal, you will receive your results only by: Marland Kitchen MyChart Message (if you have MyChart) OR . A paper copy in the mail If you have any lab test that is abnormal or we need to change your treatment, we will call you to review the results.  Testing/Procedures: NONE  Follow-Up: At [facility], you and your health needs are our priority.  As part of our continuing mission to provide you with exceptional heart care, we have created designated Provider Care Teams.  These Care Teams include your primary Cardiologist (physician) and Advanced Practice Providers (APPs -  Physician Assistants and Nurse Practitioners) who all work together to provide you with the care you need, when you need it. You will need a follow up appointment in 6 months.

## 2019-03-23 LAB — BASIC METABOLIC PANEL
BUN/Creatinine Ratio: 12 (ref 9–20)
BUN: 15 mg/dL (ref 6–24)
CO2: 20 mmol/L (ref 20–29)
Calcium: 9.9 mg/dL (ref 8.7–10.2)
Chloride: 112 mmol/L — ABNORMAL HIGH (ref 96–106)
Creatinine, Ser: 1.29 mg/dL — ABNORMAL HIGH (ref 0.76–1.27)
GFR calc Af Amer: 79 mL/min/{1.73_m2} (ref >59)
GFR calc non Af Amer: 68 mL/min/{1.73_m2} (ref >59)
Glucose: 105 mg/dL — ABNORMAL HIGH (ref 65–99)
Potassium: 4.6 mmol/L (ref 3.5–5.2)
Sodium: 147 mmol/L — ABNORMAL HIGH (ref 134–144)

## 2019-03-24 ENCOUNTER — Telehealth: Payer: Self-pay

## 2019-03-24 NOTE — Telephone Encounter (Signed)
Left message for patient to call back for results.  

## 2019-03-24 NOTE — Telephone Encounter (Signed)
-----   Message from Jenean Lindau, MD sent at 03/23/2019  8:17 AM EDT ----- Please tell him to keep himself well-hydrated and recheck in 2 weeks. Jenean Lindau, MD 03/23/2019 8:16 AM

## 2019-03-28 NOTE — Telephone Encounter (Signed)
Left 2nd message for patient to call back for results.  

## 2020-10-15 ENCOUNTER — Encounter: Payer: Self-pay | Admitting: Orthopedic Surgery

## 2020-10-15 ENCOUNTER — Ambulatory Visit (INDEPENDENT_AMBULATORY_CARE_PROVIDER_SITE_OTHER): Payer: Medicare Other

## 2020-10-15 ENCOUNTER — Ambulatory Visit (INDEPENDENT_AMBULATORY_CARE_PROVIDER_SITE_OTHER): Payer: Medicare Other | Admitting: Orthopedic Surgery

## 2020-10-15 DIAGNOSIS — M25571 Pain in right ankle and joints of right foot: Secondary | ICD-10-CM

## 2020-10-15 DIAGNOSIS — S93491A Sprain of other ligament of right ankle, initial encounter: Secondary | ICD-10-CM | POA: Diagnosis not present

## 2020-10-15 NOTE — Progress Notes (Signed)
Office Visit Note   Patient: Marc Long           Date of Birth: September 06, 1975           MRN: [id number] Visit Date: 10/15/2020              Requested by: Drue Flirt, MD [phone] [address] [place],  [place] [address] PCP: Drue Flirt, MD  Chief Complaint  Patient presents with  . Ankle Pain      HPI: Patient is a 45 year old gentleman who states that he injured his ankle while doing a spinning kick in mid January.  Patient states he still has pain about 3 out of 10.  Assessment & Plan: Visit Diagnoses:  1. Pain in right ankle and joints of right foot   2. Sprain of anterior talofibular ligament of right ankle, initial encounter     Plan: Recommend that he use his ankle stabilizing orthosis and wean out of this as he feels comfortable.  Follow-Up Instructions: No follow-ups on file.   Ortho Exam  Patient is alert, oriented, no adenopathy, well-dressed, normal affect, normal respiratory effort. Examination patient has good pulses good range of motion of the ankle anterior drawer stable proximal tibia and fibula are nontender to palpation the syndesmosis is nontender to palpation.  Patient is point tender to palpation over the anterior talofibular ligament as well as over the deltoid.  The peroneal and posterior tibial tendon have good strength with no weakness.  There is no palpable tenderness over the tendons.  Patient has good dorsiflexion of his ankle past neutral.  Imaging: XR Ankle Complete Right  Result Date: 10/15/2020 Three-view radiographs of the right ankle shows a congruent mortise no widening of the syndesmosis no osteochondral defect there is an area of calcified cartilage in the metaphyseal bone  No images are attached to the encounter.  Labs: Lab Results  Component Value Date   HGBA1C 6.6 (H) 01/09/2019   HGBA1C 5.6 04/07/2018   HGBA1C 16.7 (H) 12/09/2017   ESRSEDRATE 11 09/15/2015   CRP 0.7 (H) 09/15/2015   REPTSTATUS 12/10/2017  FINAL 12/05/2017   CULT  12/05/2017    NO GROWTH 5 DAYS Performed at [facility], [place] [address], [place], [place] [address]    LABORGA NO GROWTH 02/21/2016     Lab Results  Component Value Date   ALBUMIN 4.2 04/06/2018   ALBUMIN 2.7 (L) 12/09/2017   ALBUMIN 4.2 12/05/2017    Lab Results  Component Value Date   MG 2.1 12/09/2017   MG 2.2 12/07/2017   MG 2.3 12/06/2017   No results found for: VD25OH  No results found for: PREALBUMIN CBC EXTENDED Latest Ref Rng & Units 04/06/2018 12/09/2017 12/09/2017  WBC 4.0 - 10.5 K/uL 6.4 4.2 -  RBC 4.22 - 5.81 MIL/uL 4.83 3.83(L) 3.83(L)  HGB 13.0 - 17.0 g/dL 13.7 11.2(L) -  HCT 39.0 - 52.0 % 42.5 33.5(L) -  PLT 150 - 400 K/uL 303 144(L) -  NEUTROABS 1.7 - 7.7 K/uL - 2.4 -  LYMPHSABS 0.7 - 4.0 K/uL - 1.0 -     There is no height or weight on file to calculate BMI.  Orders:  Orders Placed This Encounter  Procedures  . XR Ankle Complete Right   No orders of the defined types were placed in this encounter.    Procedures: No procedures performed  Clinical Data: No additional findings.  ROS:  All other systems negative, except as noted in  the HPI. Review of Systems  Objective: Vital Signs: There were no vitals taken for this visit.  Specialty Comments:  No specialty comments available.  PMFS History: Patient Active Problem List   Diagnosis Date Noted  . Diabetes mellitus due to underlying condition with unspecified complications ([place]) 53/97/6734  . Chest discomfort 10/22/2018  . Schizophrenia ([place]) 04/06/2018  . Severe recurrent major depression without psychotic features ([place]) 12/16/2017  . MDD (major depressive disorder), recurrent episode, severe ([place]) 12/10/2017  . Obesity (BMI 30-39.9) 12/09/2017  . Severe bipolar I disorder, most recent episode depressed ([place])   . DKA (diabetic ketoacidoses) 12/05/2017  . Hypertriglyceridemia 02/05/2017  . DM (diabetes mellitus), type 2, uncontrolled ([place]) 12/12/2016   . Dehydration 12/12/2016  . Suicidal ideations 12/12/2016  . Bipolar disorder (manic depression) ([place]) 12/12/2016  . Hyperglycemia due to type 2 diabetes mellitus ([place]) 12/12/2016  . AKI (acute kidney injury) ([place]) 12/12/2016  . DKA, type 2, not at goal [facility]) 12/12/2016  . Obesity, Class III, BMI 40-49.9 (morbid obesity) ([place])   . Type 2 diabetes mellitus without complication, without long-term current use of insulin ([place]) 12/10/2016  . Lithium use 12/10/2016  . Moderate single current episode of major depressive disorder ([place]) 04/15/2016  . Pain in joint, shoulder region 04/14/2016  . Diverticulosis of large intestine without hemorrhage 11/07/2015  . Calculus of gallbladder without cholecystitis 11/07/2015   Past Medical History:  Diagnosis Date  . Depression   . Diabetes mellitus without complication ([place])   . Gallstones   . Obesity, Class III, BMI 40-49.9 (morbid obesity) ([facility])     Family History  Problem Relation Age of Onset  . Hypertension Mother   . Depression Mother   . Heart attack Maternal Grandfather   . Lymphoma Maternal Grandmother   . Diabetes Maternal Aunt   . Cancer Maternal Aunt     History reviewed. No pertinent surgical history. Social History   Occupational History  . Occupation: call center  Tobacco Use  . Smoking status: Never Smoker  . Smokeless tobacco: Never Used  Substance and Sexual Activity  . Alcohol use: Yes    Alcohol/week: 0.0 standard drinks    Comment: once every 2 weeks. Wine coolers  . Drug use: No  . Sexual activity: Not Currently    Birth control/protection: None

## 2020-10-21 ENCOUNTER — Emergency Department (HOSPITAL_COMMUNITY)
Admission: EM | Admit: 2020-10-21 | Discharge: 2020-10-22 | Disposition: A | Discharge status: Home / Self Care | Payer: Medicare Other | Attending: Emergency Medicine | Admitting: Emergency Medicine

## 2020-10-21 ENCOUNTER — Other Ambulatory Visit: Payer: Self-pay

## 2020-10-21 DIAGNOSIS — E1165 Type 2 diabetes mellitus with hyperglycemia: Secondary | ICD-10-CM | POA: Insufficient documentation

## 2020-10-21 DIAGNOSIS — R739 Hyperglycemia, unspecified: Secondary | ICD-10-CM

## 2020-10-21 DIAGNOSIS — Z7982 Long term (current) use of aspirin: Secondary | ICD-10-CM | POA: Diagnosis not present

## 2020-10-21 DIAGNOSIS — Z7984 Long term (current) use of oral hypoglycemic drugs: Secondary | ICD-10-CM | POA: Diagnosis not present

## 2020-10-21 DIAGNOSIS — E1110 Type 2 diabetes mellitus with ketoacidosis without coma: Secondary | ICD-10-CM | POA: Insufficient documentation

## 2020-10-21 DIAGNOSIS — R319 Hematuria, unspecified: Secondary | ICD-10-CM | POA: Diagnosis not present

## 2020-10-21 LAB — COMPREHENSIVE METABOLIC PANEL
ALT: 40 U/L (ref 0–44)
AST: 24 U/L (ref 15–41)
Albumin: 4.6 g/dL (ref 3.5–5.0)
Alkaline Phosphatase: 81 U/L (ref 38–126)
Anion gap: 9 (ref 5–15)
BUN: 16 mg/dL (ref 6–20)
CO2: 26 mmol/L (ref 22–32)
Calcium: 9.9 mg/dL (ref 8.9–10.3)
Chloride: 100 mmol/L (ref 98–111)
Creatinine, Ser: 1.3 mg/dL — ABNORMAL HIGH (ref 0.61–1.24)
GFR, Estimated: >60 mL/min (ref >60)
Glucose, Bld: 391 mg/dL — ABNORMAL HIGH (ref 70–99)
Potassium: 4 mmol/L (ref 3.5–5.1)
Sodium: 135 mmol/L (ref 135–145)
Total Bilirubin: 0.5 mg/dL (ref 0.3–1.2)
Total Protein: 8.1 g/dL (ref 6.5–8.1)

## 2020-10-21 LAB — CBC
HCT: 43.9 % (ref 39.0–52.0)
Hemoglobin: 14.7 g/dL (ref 13.0–17.0)
MCH: 27.5 pg (ref 26.0–34.0)
MCHC: 33.5 g/dL (ref 30.0–36.0)
MCV: 82.2 fL (ref 80.0–100.0)
Platelets: 299 10*3/uL (ref 150–400)
RBC: 5.34 MIL/uL (ref 4.22–5.81)
RDW: 14.7 % (ref 11.5–15.5)
WBC: 7.4 10*3/uL (ref 4.0–10.5)
nRBC: 0 % (ref 0.0–0.2)

## 2020-10-21 LAB — CBG MONITORING, ED: Glucose-Capillary: 406 mg/dL — ABNORMAL HIGH (ref 70–99)

## 2020-10-21 MED ORDER — SODIUM CHLORIDE 0.9 % IV BOLUS
1000.0000 mL | Freq: Once | INTRAVENOUS | Status: AC
  Administered 2020-10-21: 1000 mL via INTRAVENOUS

## 2020-10-21 NOTE — ED Triage Notes (Addendum)
Pt came in with c/o hyperglycemia. Pt states he started taking an antipsychotic in January that has potential SE of hyperglycemia. BGL here is 406. Pt takes Metformin 500mg  BID. Pt missed dose of metformin this morning

## 2020-10-21 NOTE — ED Provider Notes (Signed)
[facility] DEPT Provider Note   CSN: [id number] Arrival date & time: 10/21/20  2229     History Chief Complaint  Patient presents with  . Hyperglycemia    Marc Long is a 45 y.o. male with a history of schizophrenia, depression, bipolar disorder, diabetes mellitus type 2, obesity who presents the emergency department with a chief complaint of hyperglycemia.  The patient reports his blood sugar has been elevated when he has checked it at home over the last few days.  He has been repeatedly getting readings between 200s-400s.  He reports associated polyuria and polydipsia.  Today, his urinary frequency significantly increased and he was voiding approximately every 20 minutes, which prompted his visit to the ER.  He takes metformin, which he has been compliant with except for 1 missed dose this morning.  He reports that he doubled up on his metformin and took 2 doses at approximately 17:00 because of his worsening symptoms.   He denies blurred vision, vomiting, nausea, dizziness, abdominal pain, diarrhea, fever, chills, dysuria, hematuria, back pain.  He is followed by primary care, but states that the last few visits have been more focused on his ankle pain.  He has not had any changes to his home metformin dosing in some time.  He does report that he had January the patient had results he added to his medication regimen as well as Klonopin in March.  He also reports that his home BuSpar dosing was increased from in the last few months.  No other recent changes to his home medication regimen.  Patient reports that he has been eating out at restaurants more over the last few days.  He denies any other recent changes to his diet.  The history is provided by the patient and medical records. No language interpreter was used.       Past Medical History:  Diagnosis Date  . Depression   . Diabetes mellitus without complication ([place])   . Gallstones   .  Obesity, Class III, BMI 40-49.9 (morbid obesity) ([place])     Patient Active Problem List   Diagnosis Date Noted  . Diabetes mellitus due to underlying condition with unspecified complications ([place]) 27/25/3664  . Chest discomfort 10/22/2018  . Schizophrenia ([place]) 04/06/2018  . Severe recurrent major depression without psychotic features ([place]) 12/16/2017  . MDD (major depressive disorder), recurrent episode, severe ([place]) 12/10/2017  . Obesity (BMI 30-39.9) 12/09/2017  . Severe bipolar I disorder, most recent episode depressed ([place])   . DKA (diabetic ketoacidoses) 12/05/2017  . Hypertriglyceridemia 02/05/2017  . DM (diabetes mellitus), type 2, uncontrolled ([place]) 12/12/2016  . Dehydration 12/12/2016  . Suicidal ideations 12/12/2016  . Bipolar disorder (manic depression) ([place]) 12/12/2016  . Hyperglycemia due to type 2 diabetes mellitus ([place]) 12/12/2016  . AKI (acute kidney injury) ([place]) 12/12/2016  . DKA, type 2, not at goal [facility]) 12/12/2016  . Obesity, Class III, BMI 40-49.9 (morbid obesity) ([place])   . Type 2 diabetes mellitus without complication, without long-term current use of insulin ([place]) 12/10/2016  . Lithium use 12/10/2016  . Moderate single current episode of major depressive disorder ([place]) 04/15/2016  . Pain in joint, shoulder region 04/14/2016  . Diverticulosis of large intestine without hemorrhage 11/07/2015  . Calculus of gallbladder without cholecystitis 11/07/2015    No past surgical history on file.     Family History  Problem Relation Age of Onset  . Hypertension Mother   . Depression Mother   . Heart attack  Maternal Grandfather   . Lymphoma Maternal Grandmother   . Diabetes Maternal Aunt   . Cancer Maternal Aunt     Social History   Tobacco Use  . Smoking status: Never Smoker  . Smokeless tobacco: Never Used  Substance Use Topics  . Alcohol use: Yes    Alcohol/week: 0.0 standard drinks    Comment: once every 2 weeks. Wine coolers  . Drug use: No     Home Medications Prior to Admission medications   Medication Sig Start Date End Date Taking? Authorizing Provider  aspirin EC 81 MG tablet Take 81 mg by mouth daily.    [provider]  buPROPion (WELLBUTRIN XL) 300 MG 24 hr tablet Take 1 tablet (300 mg total) by mouth daily. 01/14/19   Johnn Hai, MD  busPIRone (BUSPAR) 15 MG tablet Take 15 mg by mouth 3 (three) times daily.    [provider]  fluvoxaMINE (LUVOX) 50 MG tablet Take 3 tablets (150 mg total) by mouth at bedtime. 01/14/19   Johnn Hai, MD  hydrOXYzine (ATARAX/VISTARIL) 50 MG tablet Take 1 tablet (50 mg total) by mouth every 6 (six) hours as needed for anxiety. 04/19/18   Lindell Spar I, NP  lisinopril (ZESTRIL) 20 MG tablet Take 20 mg by mouth daily.  10/11/18   [provider]  metFORMIN (GLUCOPHAGE) 1000 MG tablet Take 1 tablet (1,000 mg total) by mouth 2 (two) times daily with a meal. For diabetes management 04/19/18   Lindell Spar I, NP  Multiple Vitamin (MULTIVITAMIN WITH MINERALS) TABS tablet Take 1 tablet by mouth daily.    [provider]  QUEtiapine (SEROQUEL) 200 MG tablet Take 1 tablet (200 mg total) by mouth at bedtime. 01/14/19   Johnn Hai, MD  traZODone (DESYREL) 50 MG tablet Take 1 tablet (50 mg total) by mouth at bedtime as needed for sleep. 04/19/18   Lindell Spar I, NP    Allergies    Fish allergy, Bee venom, Keflex [cephalexin], Relafen [nabumetone], and Shellfish allergy  Review of Systems   Review of Systems  Constitutional: Negative for appetite change, chills and fever.  HENT: Negative for congestion and sore throat.   Eyes: Negative for visual disturbance.  Respiratory: Negative for shortness of breath and wheezing.   Cardiovascular: Negative for chest pain.  Gastrointestinal: Negative for abdominal pain, constipation, diarrhea, nausea and vomiting.  Endocrine: Positive for polydipsia and polyuria.  Genitourinary: Positive for frequency. Negative for dysuria  and hematuria.  Musculoskeletal: Negative for arthralgias, back pain, gait problem, joint swelling, myalgias, neck pain and neck stiffness.  Skin: Negative for rash.  Allergic/Immunologic: Negative for immunocompromised state.  Neurological: Negative for dizziness, seizures, syncope, weakness, numbness and headaches.  Psychiatric/Behavioral: Negative for confusion.    Physical Exam Updated Vital Signs BP (!) 133/95   Pulse 78   Temp 97.6 F (36.4 C) (Oral)   Resp 17   Ht 6' (1.829 m)   Wt 134.7 kg   SpO2 96%   BMI 40.28 kg/m   Physical Exam Vitals and nursing note reviewed.  Constitutional:      General: He is not in acute distress.    Appearance: He is well-developed. He is obese. He is not ill-appearing, toxic-appearing or diaphoretic.     Comments: NAD   HENT:     Head: Normocephalic.  Eyes:     Conjunctiva/sclera: Conjunctivae normal.  Cardiovascular:     Rate and Rhythm: Normal rate and regular rhythm.     Heart sounds: No murmur  heard.   Pulmonary:     Effort: Pulmonary effort is normal. No respiratory distress.     Breath sounds: No stridor. No wheezing, rhonchi or rales.  Chest:     Chest wall: No tenderness.  Abdominal:     General: There is no distension.     Palpations: Abdomen is soft. There is no mass.     Tenderness: There is no abdominal tenderness. There is no right CVA tenderness, left CVA tenderness, guarding or rebound.     Hernia: No hernia is present.     Comments: Abdomen soft, nontender, nondistended.  Musculoskeletal:     Cervical back: Neck supple.     Comments: Full active and passive range of motion of the bilateral ankles.  No edema, erythema, or warmth.  Neurovascularly intact.  Skin:    General: Skin is warm and dry.  Neurological:     Mental Status: He is alert.  Psychiatric:        Behavior: Behavior normal.     ED Results / Procedures / Treatments   Labs (all labs ordered are listed, but only abnormal results are  displayed) Labs Reviewed  COMPREHENSIVE METABOLIC PANEL - Abnormal; Notable for the following components:      Result Value   Glucose, Bld 391 (*)    Creatinine, Ser 1.30 (*)    All other components within normal limits  URINALYSIS, ROUTINE W REFLEX MICROSCOPIC - Abnormal; Notable for the following components:   Color, Urine STRAW (*)    Specific Gravity, Urine 1.032 (*)    Glucose, UA >=500 (*)    Hgb urine dipstick SMALL (*)    Ketones, ur 20 (*)    All other components within normal limits  CBG MONITORING, ED - Abnormal; Notable for the following components:   Glucose-Capillary 406 (*)    All other components within normal limits  CBG MONITORING, ED - Abnormal; Notable for the following components:   Glucose-Capillary 292 (*)    All other components within normal limits  CBC    EKG None  Radiology No results found.  Procedures Procedures   Medications Ordered in ED Medications  sodium chloride 0.9 % bolus 1,000 mL (0 mLs Intravenous Stopped 10/22/20 0051)    ED Course  I have reviewed the triage vital signs and the nursing notes.  Pertinent labs & imaging results that were available during my care of the patient were reviewed by me and considered in my medical decision making (see chart for details).    MDM Rules/Calculators/A&P                          45 year old male with a history of schizophrenia, depression, bipolar disorder, diabetes mellitus type 2, obesity who presents the emergency department with hyperglycemia over the last few days after checking his blood sugar with his home meter.  Readings have been in the 200s to 400s when he typically runs  100-150s. He reports associated polyuria and polydipsia.  He did miss his a.m. dose of his home metformin, but doubled up this evening prior to coming to the ER.  His primary concern is significantly increasing polyuria over the last 24 hours as he reports voiding approximately every 20 minutes.  Vital signs are  stable.  He is in no acute distress.  Abdomen is benign.  Physical exam is otherwise reassuring.  Labs have been reviewed and independently interpreted by me.  Point-of-care CBG is 406 on arrival.  CBC is unremarkable.  Doubt DKA, HHS, UTI, pyelonephritis, intra-abdominal infection.  After IV fluid bolus, repeat CBG is 292.  He is appropriate for discharge at this time.  He was counseled on resuming his home metformin and dietary considerations since he has diabetes mellitus type 2.  He has been advised to follow-up with his primary care provider for recheck.  I did also discussed that he did have some hemoglobinuria on exam and should follow-up with primary care to have a recheck.  ER return precautions given.  He is hemodynamically stable in no acute distress.  Safe for discharge home with outpatient follow-up as indicated.  Final Clinical Impression(s) / ED Diagnoses Final diagnoses:  Hyperglycemia  Hematuria, unspecified type    Rx / DC Orders ED Discharge Orders    None       Zaine Elsass A, PA-C 10/22/20 0058    Davonna Belling, MD 10/22/20 [phone]

## 2020-10-22 DIAGNOSIS — E1165 Type 2 diabetes mellitus with hyperglycemia: Secondary | ICD-10-CM | POA: Diagnosis not present

## 2020-10-22 LAB — CBG MONITORING, ED: Glucose-Capillary: 292 mg/dL — ABNORMAL HIGH (ref 70–99)

## 2020-10-22 LAB — URINALYSIS, ROUTINE W REFLEX MICROSCOPIC
Bacteria, UA: NONE SEEN
Bilirubin Urine: NEGATIVE
Glucose, UA: >=500 mg/dL — AB
Ketones, ur: 20 mg/dL — AB
Leukocytes,Ua: NEGATIVE
Nitrite: NEGATIVE
Protein, ur: NEGATIVE mg/dL
Specific Gravity, Urine: 1.032 — ABNORMAL HIGH (ref 1.005–1.030)
pH: 5 (ref 5.0–8.0)

## 2020-10-22 NOTE — Discharge Instructions (Signed)
Thank you for allowing me to care for you today in the Emergency Department.   You were seen today for high blood sugar.  You were treated with IV fluids and your blood sugar significantly improved.  Please continue to take your home metformin as prescribed.  I have included information on a good diet to follow since you have diabetes that will help you to control your blood sugar.  Please call and schedule a follow-up appointment with your primary care provider for a recheck to see if you need to have your medications adjusted.  Your urine did not show any signs of infection, but did have a small amount of microscopic blood.  Please follow-up with primary care to have them recheck this to ensure it resolves.  Return to the emergency department if you develop confusion, uncontrollable vomiting, if you pass out, or have other new, concerning symptoms.

## 2021-02-01 ENCOUNTER — Encounter: Payer: Self-pay | Admitting: Family Medicine

## 2021-02-01 ENCOUNTER — Other Ambulatory Visit: Payer: Self-pay

## 2021-02-01 ENCOUNTER — Ambulatory Visit (INDEPENDENT_AMBULATORY_CARE_PROVIDER_SITE_OTHER): Payer: Medicare Other | Admitting: Family Medicine

## 2021-02-01 ENCOUNTER — Other Ambulatory Visit: Payer: Self-pay | Admitting: Family Medicine

## 2021-02-01 VITALS — BP 126/68 | HR 71 | Ht 72.0 in | Wt 311.0 lb

## 2021-02-01 DIAGNOSIS — E119 Type 2 diabetes mellitus without complications: Secondary | ICD-10-CM

## 2021-02-01 DIAGNOSIS — E785 Hyperlipidemia, unspecified: Secondary | ICD-10-CM

## 2021-02-01 DIAGNOSIS — Z23 Encounter for immunization: Secondary | ICD-10-CM

## 2021-02-01 DIAGNOSIS — Z0000 Encounter for general adult medical examination without abnormal findings: Secondary | ICD-10-CM

## 2021-02-01 DIAGNOSIS — E1169 Type 2 diabetes mellitus with other specified complication: Secondary | ICD-10-CM | POA: Diagnosis not present

## 2021-02-01 NOTE — Patient Instructions (Addendum)
It was great seeing you today!  Today you came in to establish care. We are checking your A1c, and doing other blood work to check cholesterol, and your electrolytes and kidney function, as well as your cholesterol and triglycerides. I will call you with any abnormal results.   We have provided a list of opthalmologists you can call to schedule an appointment   Please check-out at the front desk before leaving the clinic. I'd like to see you back in about 3 months to follow up on your diabetes, but if you need to be seen earlier than that for any new issues we're happy to fit you in, just give [place] a call!  Feel free to call with any questions or concerns at any time, at [phone].   Take care,  Dr. Shary Key [facility]

## 2021-02-01 NOTE — Progress Notes (Signed)
      SUBJECTIVE:   Chief compliant/HPI: annual examination  Marc Long is a 45 y.o. who presents today for an annual exam and to establish care .   PMH Schizytypal , bipolar, depression Follows with psychiatrist Dr. Rosanna Randy, was just seen yesterday.  PHQ9 today 15 with score of 1 on number 9.  States he daydreams of him killing himself, occurs 2-3 times a day for several years which says eventually it becomes appealing. Has attempted to harm himself in 2019 with a plan to stop taking diabetes medicine. Currently no plan for SI or HI.  Does have good support system and family  Hx of diverticulitis. Denies current abdominal pain or blood in stool   Diabetes Lantus 27 units  Metformin 1000 BID   Checks sugars on occasion. Has urinary frequency, denies polydipsia,  Feels dizzy when he gets out of the car  A1c 6.6 2 years ago  HTN  Takes lisinopril- HCTZ. States BP typically  runs 140/98 Denies CP, palpitations, SOB, headaches   Social  Denies tobacco, rec drug use  Alcohol drinks <1 drink a day   Diet: 50% ok, and 50% "crappy" Just started working out the past couple of weeks. Doing body weight resistance training, dumbells, etc   Denies being sexually active. Not in a relationship   Vitamins: daily MVI, fiber, cinnamon '2000mg'$  daily   COVID vaccinated and boosted x1   OBJECTIVE:   BP 126/68   Pulse 71   Ht 6' (1.829 m)   Wt (!) 311 lb (141.1 kg)   SpO2 98%   BMI 42.18 kg/m    General: alert, pleasant, NAD CV: RRR no murmurs Resp: CTAB normal WOB GI: soft, non distended Psych: mood and affect appropriate. Speech non tangential. Thought content normal   ASSESSMENT/PLAN:   No problem-specific Assessment & Plan notes found for this encounter.   Annual Examination  See AVS for recommendations.  PHQ score 15, reviewed.  Blood pressure value is at goal.   Considered the following screening exams based upon USPSTF recommendations: Diabetes  screening: discussed and ordered Screening for elevated cholesterol: discussed and ordered HIV testing:  not ordered Hepatitis C: discussed and ordered Hepatitis B:  not ordered  Syphilis if at high risk: n/a Immunization: Prevnar 20  Diabetes Type 2 A1c today 7.8, up from 6.6 2 years ago.  - Continue Lantus 27 units, Metformin 1000 BID   - Monitor diet, and continue getting in physical activity  - list of ophthalmologists provided   HTN BP 126/68 - Con't lisinopril- HCTZ  Schizytypal, bipolar, depression Follows with psychiatrist. PHQ9 today 15 with score of 1 on number 9. Currently no SI/HI - Con't medications per psych   Will check lipid panel and CMP   Follow up in 3 months to check on diabetes, or sooner if indicated.    [place]

## 2021-02-02 LAB — COMPREHENSIVE METABOLIC PANEL
ALT: 25 IU/L (ref 0–44)
AST: 19 IU/L (ref 0–40)
Albumin/Globulin Ratio: 1.7 (ref 1.2–2.2)
Albumin: 4.6 g/dL (ref 4.0–5.0)
Alkaline Phosphatase: 56 IU/L (ref 44–121)
BUN/Creatinine Ratio: 11 (ref 9–20)
BUN: 22 mg/dL (ref 6–24)
Bilirubin Total: 0.2 mg/dL (ref 0.0–1.2)
CO2: 23 mmol/L (ref 20–29)
Calcium: 10.1 mg/dL (ref 8.7–10.2)
Chloride: 102 mmol/L (ref 96–106)
Creatinine, Ser: 2 mg/dL — ABNORMAL HIGH (ref 0.76–1.27)
Globulin, Total: 2.7 g/dL (ref 1.5–4.5)
Glucose: 110 mg/dL — ABNORMAL HIGH (ref 65–99)
Potassium: 4.6 mmol/L (ref 3.5–5.2)
Sodium: 142 mmol/L (ref 134–144)
Total Protein: 7.3 g/dL (ref 6.0–8.5)
eGFR: 42 mL/min/{1.73_m2} — ABNORMAL LOW (ref >59)

## 2021-02-02 LAB — LIPID PANEL
Chol/HDL Ratio: 3.9 ratio (ref 0.0–5.0)
Cholesterol, Total: 116 mg/dL (ref 100–199)
HDL: 30 mg/dL — ABNORMAL LOW (ref >39)
LDL Chol Calc (NIH): 49 mg/dL (ref 0–99)
Triglycerides: 230 mg/dL — ABNORMAL HIGH (ref 0–149)
VLDL Cholesterol Cal: 37 mg/dL (ref 5–40)

## 2021-02-02 LAB — HCV INTERPRETATION

## 2021-02-02 LAB — HEMOGLOBIN A1C
Est. average glucose Bld gHb Est-mCnc: 177 mg/dL
Hgb A1c MFr Bld: 7.8 % — ABNORMAL HIGH (ref 4.8–5.6)

## 2021-02-02 LAB — HCV AB W REFLEX TO QUANT PCR: HCV Ab: <0.1 s/co ratio (ref 0.0–0.9)

## 2021-02-05 ENCOUNTER — Other Ambulatory Visit: Payer: Self-pay | Admitting: Family Medicine

## 2021-02-05 DIAGNOSIS — N1832 Chronic kidney disease, stage 3b: Secondary | ICD-10-CM

## 2021-02-14 ENCOUNTER — Telehealth: Payer: Self-pay

## 2021-02-14 NOTE — Telephone Encounter (Signed)
Patient LVM on nurse line reporting on going dizzy spells for several months. I attempted to call patient back, however went to VM. VM left for patient to call the office to schedule an apt.

## 2021-02-15 NOTE — Telephone Encounter (Signed)
Patient returns call to nurse line to schedule appointment. Patient requests next available appointment. Scheduled patient with next available provider Nori Riis) on 9/7. ED precautions given.   Talbot Grumbling, RN

## 2021-02-20 ENCOUNTER — Encounter: Payer: Self-pay | Admitting: Family Medicine

## 2021-02-20 ENCOUNTER — Ambulatory Visit (INDEPENDENT_AMBULATORY_CARE_PROVIDER_SITE_OTHER): Payer: Medicare Other | Admitting: Family Medicine

## 2021-02-20 ENCOUNTER — Other Ambulatory Visit: Payer: Self-pay

## 2021-02-20 ENCOUNTER — Ambulatory Visit (HOSPITAL_COMMUNITY)
Admission: RE | Admit: 2021-02-20 | Discharge: 2021-02-20 | Disposition: A | Discharge status: Home / Self Care | Payer: Medicare Other | Source: Ambulatory Visit | Attending: Family Medicine | Admitting: Family Medicine

## 2021-02-20 VITALS — BP 111/60 | HR 90 | Wt 307.2 lb

## 2021-02-20 DIAGNOSIS — F314 Bipolar disorder, current episode depressed, severe, without psychotic features: Secondary | ICD-10-CM

## 2021-02-20 DIAGNOSIS — D171 Benign lipomatous neoplasm of skin and subcutaneous tissue of trunk: Secondary | ICD-10-CM

## 2021-02-20 DIAGNOSIS — R42 Dizziness and giddiness: Secondary | ICD-10-CM

## 2021-02-20 DIAGNOSIS — N1832 Chronic kidney disease, stage 3b: Secondary | ICD-10-CM | POA: Diagnosis not present

## 2021-02-20 MED ORDER — TRAZODONE HCL 50 MG PO TABS: ORAL_TABLET | ORAL | 0 refills | Status: DC

## 2021-02-20 NOTE — Patient Instructions (Addendum)
Lets cut your blood pressure medicine, the lisinopril, to one half tab a day.  If the tablet is really small and cutting it in half is a problem for you, please call the office and I will call in a 10 mg dose.  I want to try just cutting it in half because we may end up putting him back to the 20 mg dose when you come back.  Keep taking everything else as you have been.   I am going to check on your kidney doctor referral and see if we can make sure that that is in process.  I would like you to make an appointment to see your regular doctor, Dr. Arby Barrette in the next 3 to 4 weeks.  I suspect that your dizziness is related to multiple medication side effects.  We did an EKG today which showed nothing worrisome

## 2021-02-21 DIAGNOSIS — N1832 Chronic kidney disease, stage 3b: Secondary | ICD-10-CM | POA: Insufficient documentation

## 2021-02-21 DIAGNOSIS — R42 Dizziness and giddiness: Secondary | ICD-10-CM | POA: Insufficient documentation

## 2021-02-21 NOTE — Assessment & Plan Note (Signed)
We called and checked on his referral and they are going to communicate with him today.  Notably, on perusal of his chart it looks like at 1 time he was on lithium therapy.  He is not on that now.

## 2021-02-21 NOTE — Progress Notes (Signed)
    CHIEF COMPLAINT / HPI: Is here with his mother for evaluation of lightheadedness.  Is been going on multiple weeks, perhaps 6 weeks total.  Worse in the last 2.  Notices lightheadedness when he gets up out of the car to a standing position.  This is the most frequent time.  Has occasionally occurred after he stands from being seated at home for a while.  It usually resolves within about 30 seconds.  He has had medication change and that they increased his trazodone to 100 mg nightly.  He did fall once about 2 to 3 weeks ago in.  This occurred while he was going to the bathroom after getting up from a night sleep and he felt dizzy and went down on his knees.  During these lightheaded episodes he feels like his vision is closing in but he does not note any spinning sensation. 2.  He is followed by psychiatry and an ACT team. #3.  He wants me to look at a soft tissue lesion on his back.  He said his mom is worried about it. #4.  At last office visit he was referred to nephrology but he has not yet heard from them.  He is quite worried about his kidneys.   PERTINENT  PMH / PSH: I have reviewed the patient's medications, allergies, past medical and surgical history, smoking status and updated in the EMR as appropriate.   OBJECTIVE:  BP 111/60   Pulse 90   Wt (!) 307 lb 3.2 oz (139.3 kg)   SpO2 99%   BMI 41.66 kg/m  Vital signs reviewed. GENERAL: Well-developed, well-nourished, no acute distress. CARDIOVASCULAR: Regular rate and rhythm no murmur gallop or rub LUNGS: Clear to auscultation bilaterally, no rales or wheeze. ABDOMEN: Soft positive bowel sounds NEURO: No gross focal neurological deficits.  The rate is normal.  He does not experience any dizziness when he stands from a seated position today. MSK: Movement of extremity x 4. SKIN: Small quarter size soft tissue mass at the center of the lumbar area.  Nontender.  No skin changes or erythema around it.  Symmetrical.  EKG: Normal sinus  rhythm with a rate of 85.  Normal axis.  No ST-T changes that would be consistent with acute coronary syndrome.  No arrhythmia is noted.  Review lab work from recent office visit August 19 which shows creatinine of 2.0. ASSESSMENT / PLAN:   Stage 3b chronic kidney disease ([place]) We called and checked on his referral and they are going to communicate with him today.  Notably, on perusal of his chart it looks like at 1 time he was on lithium therapy.  He is not on that now.  Severe bipolar I disorder, most recent episode depressed [facility]) He is in care under psychiatry.  I did review his medicines.  Dizziness Clinically this seems to be mild orthostasis.  I noted his blood pressure today 110/60.  We will decrease his lisinopril from 20 mg to 10 mg.  He will follow-up with nephrology and with his PCP in the next month.  He is on multiple medicines that can have this effect.  Gave him precautions of waiting a few minutes after standing before he proceeds.  Reviewed his EKG.  Discussed extensively with him and his mom.   Dorcas Mcmurray MD

## 2021-02-21 NOTE — Assessment & Plan Note (Signed)
Clinically this seems to be mild orthostasis.  I noted his blood pressure today 110/60.  We will decrease his lisinopril from 20 mg to 10 mg.  He will follow-up with nephrology and with his PCP in the next month.  He is on multiple medicines that can have this effect.  Gave him precautions of waiting a few minutes after standing before he proceeds.  Reviewed his EKG.  Discussed extensively with him and his mom.

## 2021-02-21 NOTE — Assessment & Plan Note (Signed)
He is in care under psychiatry.  I did review his medicines.

## 2021-02-28 ENCOUNTER — Other Ambulatory Visit: Payer: Self-pay | Admitting: Nephrology

## 2021-02-28 DIAGNOSIS — N1832 Chronic kidney disease, stage 3b: Secondary | ICD-10-CM

## 2021-03-01 ENCOUNTER — Ambulatory Visit
Admission: RE | Admit: 2021-03-01 | Discharge: 2021-03-01 | Disposition: A | Discharge status: Home / Self Care | Payer: Medicare Other | Source: Ambulatory Visit | Attending: Nephrology | Admitting: Nephrology

## 2021-03-01 DIAGNOSIS — N1832 Chronic kidney disease, stage 3b: Secondary | ICD-10-CM

## 2021-03-11 ENCOUNTER — Encounter: Payer: Self-pay | Admitting: Family Medicine

## 2021-03-11 ENCOUNTER — Ambulatory Visit (INDEPENDENT_AMBULATORY_CARE_PROVIDER_SITE_OTHER): Payer: Medicare Other | Admitting: Family Medicine

## 2021-03-11 ENCOUNTER — Other Ambulatory Visit: Payer: Self-pay

## 2021-03-11 VITALS — BP 117/84 | HR 88 | Wt 308.8 lb

## 2021-03-11 DIAGNOSIS — N1832 Chronic kidney disease, stage 3b: Secondary | ICD-10-CM

## 2021-03-11 DIAGNOSIS — R45851 Suicidal ideations: Secondary | ICD-10-CM

## 2021-03-11 DIAGNOSIS — E119 Type 2 diabetes mellitus without complications: Secondary | ICD-10-CM

## 2021-03-11 DIAGNOSIS — R42 Dizziness and giddiness: Secondary | ICD-10-CM

## 2021-03-11 DIAGNOSIS — F314 Bipolar disorder, current episode depressed, severe, without psychotic features: Secondary | ICD-10-CM

## 2021-03-11 NOTE — Progress Notes (Signed)
    SUBJECTIVE:   CHIEF COMPLAINT / HPI:   [name] is a 45 yo who presents for f/u on orthostatic hypotension and adjustment of blood pressure medication. Last clinic visit his Lisinopril was decreased from 20mg  to 10mg . After this visit he saw his nephrologist who discontinued his Lisinopril entirely   BP since coming off of med averaging 118-130/84-88 at home. Feels dizziness has slightly improved, and no car dizziness- stated he would have dizziness when coming out of the car which has resolved.   He brought in a log of his blood sugars which range from 82-136 at different times of day. Recommended writing morning vs evening measurements. Trying to watch his nutrition, states he will eat sandwhiches with low carb bread, cutting down on food and drink with a lot of sugar.   Saw psychiatrist last week, has new counseler. Has continued thoughts he would be better off dead, no plan to act on them. No recent SI attempts. Has a hx of an attempt by stopping some of his diabetes medications in the past.   OBJECTIVE:   BP 117/84   Pulse 88   Wt (!) 308 lb 12.8 oz (140.1 kg)   SpO2 96%   BMI 41.88 kg/m    Physical exam  General: alert, pleasant, NAD CV: RRR no murmurs Resp: CTAB normal WOB GI: soft, non distended  Derm: no visible rashes or lesions.   ASSESSMENT/PLAN:   No problem-specific Assessment & Plan notes found for this encounter.   Dizziness  Improved since discontinuing Lisinopril. No episodes of syncope. BP stable today at 117/84.   CKD stage 3b Creatinine 2, GFR 42 on 8/19, up from baseline of ~1.1. Follows with nephrology  - avoid nephrotoxic agents  - psychiatry to adjust medications that may be contributing   Severe Bipolar 1 disorder  Current PHQ9 with passive SI. Unchanged from last visit. He does not have a plan for his SI. Currently under continual care of psychiatry whom he just saw about a week ago. - follow up with psych - continue to monitor     DM2 Last A1c on 8/19 7.8 up from 6.6 2 years ago. Currently taking Lantus 27 units at bedtime, Metformin 1000 mg daily. Sugars at home range from 82-136 (fasting and non fasting). Recommended indicating on log when he checks fasting vs non fasting sugars.  -continue current regimen -may want to consider adding GLP-1 agonist for additonal weight loss benefit  -follow up in Nov for A1c and foot exam   [place]

## 2021-03-11 NOTE — Patient Instructions (Signed)
It was great seeing you today!  You came in for follow up since being taken off of your blood pressure medication. Glad to see your dizziness has improved and your blood pressure is still controlled.   Please check-out at the front desk before leaving the clinic. I'd like to see you back in about 6-8 weeks for your diabetes follow up, but if you need to be seen earlier than that for any new issues we're happy to fit you in, just give [place] a call!  Feel free to call with any questions or concerns at any time, at [phone].   Take care,  Dr. Shary Key [facility]

## 2021-04-01 ENCOUNTER — Ambulatory Visit (INDEPENDENT_AMBULATORY_CARE_PROVIDER_SITE_OTHER): Payer: Medicare Other | Admitting: Family Medicine

## 2021-04-01 ENCOUNTER — Encounter: Payer: Self-pay | Admitting: Family Medicine

## 2021-04-01 ENCOUNTER — Other Ambulatory Visit: Payer: Self-pay

## 2021-04-01 VITALS — BP 124/74 | HR 74 | Ht 72.0 in | Wt 312.0 lb

## 2021-04-01 DIAGNOSIS — N1832 Chronic kidney disease, stage 3b: Secondary | ICD-10-CM

## 2021-04-01 DIAGNOSIS — M549 Dorsalgia, unspecified: Secondary | ICD-10-CM | POA: Diagnosis not present

## 2021-04-01 DIAGNOSIS — Z23 Encounter for immunization: Secondary | ICD-10-CM

## 2021-04-01 MED ORDER — CYCLOBENZAPRINE HCL 10 MG PO TABS: 10.0000 mg | ORAL_TABLET | Freq: Every day | ORAL | 0 refills | Status: DC

## 2021-04-01 MED ORDER — EMPAGLIFLOZIN 10 MG PO TABS: 10.0000 mg | ORAL_TABLET | Freq: Every day | ORAL | 0 refills | Status: DC

## 2021-04-01 NOTE — Patient Instructions (Addendum)
It was great seeing you today!  Today we checked on your back pain which is likely due to muscle strain from your move. Continue alternating between ice and heat and doing stretches. You can continue Tylenol and I have prescribed Flexeril to take at night for your pain.   I have started you on Jardiance 10mg  daily for heart protection, kidneys and for your diabetes.   We also discussed weight loss and I recommended checking out [facility]: phone: [phone] [address], [place], [place] [phone]. You can call them to get on their wait list.   Please check-out at the front desk before leaving the clinic. I'd like to see you back in about 2-3 weeks to check on new medication and back pain, but if you need to be seen earlier than that for any new issues we're happy to fit you in, just give [place] a call!  Feel free to call with any questions or concerns at any time, at [phone].   Take care,  Dr. Shary Key [facility]

## 2021-04-01 NOTE — Progress Notes (Signed)
    SUBJECTIVE:   CHIEF COMPLAINT / HPI:   Mr. Fleischer is a 45 yo who presents for back pain. States he was helping to move this past weekend so was lifting heavy objects and after that started having pain in his mid and lowe back. Feels ok while sitting down. Worse when moving around. He has been able to walk though gait more slow. Has been using Tylenol and alternating ice and heating pad. Pain radiates down right leg. Does have history of sciatica and feels it is exacerbated. Denies loss of bowel and bladder function   Patient brought in his blood pressure and blood sugar logs. BS at home 88-115 fasting BP averages 128/88 at home    OBJECTIVE:   BP 124/74   Pulse 74   Ht 6' (1.829 m)   Wt (!) 312 lb (141.5 kg)   SpO2 96%   BMI 42.31 kg/m    Physical exam  General: well appearing, NAD Cardiovascular: RRR, no murmurs Lungs: CTAB. Normal WOB Abdomen: soft, non-distended, non-tender Skin: warm, dry. No edema MSK: back without erythema or lesions. Negative straight leg test  ASSESSMENT/PLAN:   No problem-specific Assessment & Plan notes found for this encounter.   Back pain, acute Occurring after moving about 2-3 days prior. Pain occurs along mid-lower back and down right leg. Does have hx of sciatica. On exam no erythema, edema or lesions noted. Mildly tender to palpation along paraspinal muscles in thoracic and lumbar regions. Some midline tenderness appreciated. Recommended continuing with supportive management including alternating between ice and heat, stretching. Advised to continue Tylenol and prescribed Flexeril to take at night. Advised to return if pain worsens and we can consider XR.   [id number] Patient now followed by nephrologist. Avoiding NSAIDs and other nephrotoxic agents. Added Jardiance 10mg  for kidney and cardiac protection, as well as DM2.   Advised to follow up in 2-3 weeks to check on medication change and back pain   Shary Key, [place]

## 2021-04-18 ENCOUNTER — Encounter: Payer: Self-pay | Admitting: Family Medicine

## 2021-04-18 ENCOUNTER — Ambulatory Visit (INDEPENDENT_AMBULATORY_CARE_PROVIDER_SITE_OTHER): Payer: Medicare Other

## 2021-04-18 ENCOUNTER — Other Ambulatory Visit: Payer: Self-pay

## 2021-04-18 ENCOUNTER — Ambulatory Visit (INDEPENDENT_AMBULATORY_CARE_PROVIDER_SITE_OTHER): Payer: Medicare Other | Admitting: Family Medicine

## 2021-04-18 VITALS — BP 118/86 | HR 91 | Ht 72.0 in | Wt 308.2 lb

## 2021-04-18 DIAGNOSIS — I10 Essential (primary) hypertension: Secondary | ICD-10-CM

## 2021-04-18 DIAGNOSIS — L02419 Cutaneous abscess of limb, unspecified: Secondary | ICD-10-CM

## 2021-04-18 DIAGNOSIS — G8929 Other chronic pain: Secondary | ICD-10-CM | POA: Diagnosis not present

## 2021-04-18 DIAGNOSIS — Z23 Encounter for immunization: Secondary | ICD-10-CM | POA: Diagnosis present

## 2021-04-18 DIAGNOSIS — M549 Dorsalgia, unspecified: Secondary | ICD-10-CM

## 2021-04-18 MED ORDER — COMFORT TOUCH BP CUFF/LARGE MISC: 1.0000 | 0 refills | Status: AC

## 2021-04-18 NOTE — Progress Notes (Addendum)
    SUBJECTIVE:   CHIEF COMPLAINT / HPI:   Marc Long is a 45 yo who presents for follow up. Last visit on 10/17 added Jardiance and has been tolerating well. He has been recording his blood pressure at home and he notes his BP has been a little more elevated ranging from 123-134/ 96-103. Asymptomatic at home, denying chest pain, headaches, SOB.  Additionally he states he has a boil under his left arm that has been there for years that popped on Saturday. Feels like this is the first time it has drained. Denies fevers, chills.   He states his back pain is doing better tho it is still stiff. Has not tried exercises. States Flexeril has helped some.    OBJECTIVE:   BP 118/86   Pulse 91   Ht 6' (1.829 m)   Wt (!) 308 lb 3.2 oz (139.8 kg)   SpO2 97%   BMI 41.80 kg/m    General: alert, pleasant, NAD CV: RRR no murmurs  Resp: CTAB normal WOB  GI: soft, non distended  Derm: approx 3cm area of induration without fluctuance on L axilla. Non erythematous. No drainage. Non tender to palpation  ASSESSMENT/PLAN:   No problem-specific Assessment & Plan notes found for this encounter.   L axillary abscess  Present for several years, with drainage on its own 5 days ago. Denies recent fevers or chills. Approx 3 cm area of induration without fluctuance, erythema, or tenderness. No concern for infection. Discussed conservative management and return precautions if it were to worsen in which it would likely need to be drained.   Back pain Chronic, occurring for years but exacerbated about a month ago. No red flag symptoms (see previous notes for additional details). Flexeril has helped some. Still stiff. Discussed physical therapy and patient states he would rather do exercises on his own. Provided him with with a list of exercises  HTN BP 118/86. BP measurements at home ranges from 123-134/ 96-103. Asymptomatic- denies CP, headaches, SOB. Currently on Lisinopril 20mg  daily. Will continue  medication and discussed checking BP at pharmacy to see if cuff may be off.    Advised patient to return in the next 4 weeks for blood work to check kidney function and will also obtain CBC and additional tests as needed.     [place]

## 2021-04-18 NOTE — Patient Instructions (Addendum)
It was great seeing you today!  Today we followed up on the new medication additions and I am glad you are tolerating them well.   Call and schedule appointment to be seen at [facility]: [phone]  I have attached exercises to do for your back: Hold all positions for 60 seconds, 3 sets   Check BP cuff against pharmacy cuff. I ordered another one for you to see if its cheaper this way.   Continue the great work on staying on top of your health  I will see you back at your next physical, but if you need to be seen earlier than that for any new issues we're happy to fit you in, just give [place] a call!   Feel free to call with any questions or concerns at any time, at [phone].   Take care,  Dr. Shary Key [facility]   Low Back Sprain or Strain Rehab Ask your health care provider which exercises are safe for you. Do exercises exactly as told by your health care provider and adjust them as directed. It is normal to feel mild stretching, pulling, tightness, or discomfort as you do these exercises. Stop right away if you feel sudden pain or your pain gets worse. Do not begin these exercises until told by your health care provider. Stretching and range-of-motion exercises These exercises warm up your muscles and joints and improve the movement and flexibility of your back. These exercises also help to relieve pain, numbness, and tingling. Lumbar rotation  Lie on your back on a firm bed or the floor with your knees bent. Straighten your arms out to your sides so each arm forms a 90-degree angle (right angle) with a side of your body. Slowly move (rotate) both of your knees to one side of your body until you feel a stretch in your lower back (lumbar). Try not to let your shoulders lift off the floor. Hold this position for __________ seconds. Tense your abdominal muscles and slowly move your knees back to the starting position. Repeat  this exercise on the other side of your body. Repeat __________ times. Complete this exercise __________ times a day.   Single knee to chest  Lie on your back on a firm bed or the floor with both legs straight. Bend one of your knees. Use your hands to move your knee up toward your chest until you feel a gentle stretch in your lower back and buttock. Hold your leg in this position by holding on to the front of your knee. Keep your other leg as straight as possible. Hold this position for __________ seconds. Slowly return to the starting position. Repeat with your other leg. Repeat __________ times. Complete this exercise __________ times a day. Prone extension on elbows  Lie on your abdomen on a firm bed or the floor (prone position). Prop yourself up on your elbows. Use your arms to help lift your chest up until you feel a gentle stretch in your abdomen and your lower back. This will place some of your body weight on your elbows. If this is uncomfortable, try stacking pillows under your chest. Your hips should stay down, against the surface that you are lying on. Keep your hip and back muscles relaxed. Hold this position for __________ seconds. Slowly relax your upper body and return to the starting position. Repeat __________ times. Complete this exercise __________ times a day. Strengthening exercises These exercises build strength and  endurance in your back. Endurance is the ability to use your muscles for a long time, even after they get tired. Pelvic tilt This exercise strengthens the muscles that lie deep in the abdomen. Lie on your back on a firm bed or the floor with your legs extended. Bend your knees so they are pointing toward the ceiling and your feet are flat on the floor. Tighten your lower abdominal muscles to press your lower back against the floor. This motion will tilt your pelvis so your tailbone points up toward the ceiling instead of pointing to your feet or the  floor. To help with this exercise, you may place a small towel under your lower back and try to push your back into the towel. Hold this position for __________ seconds. Let your muscles relax completely before you repeat this exercise. Repeat __________ times. Complete this exercise __________ times a day. Alternating arm and leg raises  Get on your hands and knees on a firm surface. If you are on a hard floor, you may want to use padding, such as an exercise mat, to cushion your knees. Line up your arms and legs. Your hands should be directly below your shoulders, and your knees should be directly below your hips. Lift your left leg behind you. At the same time, raise your right arm and straighten it in front of you. Do not lift your leg higher than your hip. Do not lift your arm higher than your shoulder. Keep your abdominal and back muscles tight. Keep your hips facing the ground. Do not arch your back. Keep your balance carefully, and do not hold your breath. Hold this position for __________ seconds. Slowly return to the starting position. Repeat with your right leg and your left arm. Repeat __________ times. Complete this exercise __________ times a day. Abdominal set with straight leg raise  Lie on your back on a firm bed or the floor. Bend one of your knees and keep your other leg straight. Tense your abdominal muscles and lift your straight leg up, 4-6 inches (10-15 cm) off the ground. Keep your abdominal muscles tight and hold this position for __________ seconds. Do not hold your breath. Do not arch your back. Keep it flat against the ground. Keep your abdominal muscles tense as you slowly lower your leg back to the starting position. Repeat with your other leg. Repeat __________ times. Complete this exercise __________ times a day. Single leg lower with bent knees Lie on your back on a firm bed or the floor. Tense your abdominal muscles and lift your feet off the floor,  one foot at a time, so your knees and hips are bent in 90-degree angles (right angles). Your knees should be over your hips and your lower legs should be parallel to the floor. Keeping your abdominal muscles tense and your knee bent, slowly lower one of your legs so your toe touches the ground. Lift your leg back up to return to the starting position. Do not hold your breath. Do not let your back arch. Keep your back flat against the ground. Repeat with your other leg. Repeat __________ times. Complete this exercise __________ times a day. Posture and body mechanics Good posture and healthy body mechanics can help to relieve stress in your body's tissues and joints. Body mechanics refers to the movements and positions of your body while you do your daily activities. Posture is part of body mechanics. Good posture means: Your spine is in its natural S-curve  position (neutral). Your shoulders are pulled back slightly. Your head is not tipped forward (neutral). Follow these guidelines to improve your posture and body mechanics in your everyday activities. Standing  When standing, keep your spine neutral and your feet about hip-width apart. Keep a slight bend in your knees. Your ears, shoulders, and hips should line up. When you do a task in which you stand in one place for a long time, place one foot up on a stable object that is 2-4 inches (5-10 cm) high, such as a footstool. This helps keep your spine neutral. Sitting  When sitting, keep your spine neutral and keep your feet flat on the floor. Use a footrest, if necessary, and keep your thighs parallel to the floor. Avoid rounding your shoulders, and avoid tilting your head forward. When working at a desk or a computer, keep your desk at a height where your hands are slightly lower than your elbows. Slide your chair under your desk so you are close enough to maintain good posture. When working at a computer, place your monitor at a height where  you are looking straight ahead and you do not have to tilt your head forward or downward to look at the screen. Resting When lying down and resting, avoid positions that are most painful for you. If you have pain with activities such as sitting, bending, stooping, or squatting, lie in a position in which your body does not bend very much. For example, avoid curling up on your side with your arms and knees near your chest (fetal position). If you have pain with activities such as standing for a long time or reaching with your arms, lie with your spine in a neutral position and bend your knees slightly. Try the following positions: Lying on your side with a pillow between your knees. Lying on your back with a pillow under your knees. Lifting  When lifting objects, keep your feet at least shoulder-width apart and tighten your abdominal muscles. Bend your knees and hips and keep your spine neutral. It is important to lift using the strength of your legs, not your back. Do not lock your knees straight out. Always ask for help to lift heavy or awkward objects. This information is not intended to replace advice given to you by your health care provider. Make sure you discuss any questions you have with your health care provider. Document Revised: 08/20/2020 Document Reviewed: 08/20/2020 Elsevier Patient Education  [place].

## 2021-04-29 ENCOUNTER — Encounter: Payer: Self-pay | Admitting: Family Medicine

## 2021-05-07 ENCOUNTER — Other Ambulatory Visit: Payer: Self-pay | Admitting: Family Medicine

## 2021-06-08 ENCOUNTER — Telehealth: Payer: Medicare Other | Admitting: Nurse Practitioner

## 2021-06-08 DIAGNOSIS — J40 Bronchitis, not specified as acute or chronic: Secondary | ICD-10-CM | POA: Diagnosis not present

## 2021-06-08 MED ORDER — AZITHROMYCIN 250 MG PO TABS: ORAL_TABLET | ORAL | 0 refills | Status: AC

## 2021-06-08 MED ORDER — BENZONATATE 100 MG PO CAPS: 100.0000 mg | ORAL_CAPSULE | Freq: Three times a day (TID) | ORAL | 0 refills | Status: AC | PRN

## 2021-06-08 NOTE — Progress Notes (Signed)
Virtual Visit Consent   Fabien Travelstead, you are scheduled for a virtual visit with a [facility] provider today.     Just as with appointments in the office, your consent must be obtained to participate.  Your consent will be active for this visit and any virtual visit you may have with one of our providers in the next 365 days.     If you have a MyChart account, a copy of this consent can be sent to you electronically.  All virtual visits are billed to your insurance company just like a traditional visit in the office.    As this is a virtual visit, video technology does not allow for your provider to perform a traditional examination.  This may limit your provider's ability to fully assess your condition.  If your provider identifies any concerns that need to be evaluated in person or the need to arrange testing (such as labs, EKG, etc.), we will make arrangements to do so.     Although advances in technology are sophisticated, we cannot ensure that it will always work on either your end or our end.  If the connection with a video visit is poor, the visit may have to be switched to a telephone visit.  With either a video or telephone visit, we are not always able to ensure that we have a secure connection.     I need to obtain your verbal consent now.   Are you willing to proceed with your visit today?    Marc Long has provided verbal consent on 06/08/2021 for a virtual visit (video or telephone).   Apolonio Schneiders, FNP   Date: 06/08/2021 11:51 AM   Virtual Visit via Video Note   I, Apolonio Schneiders, connected with  Marc Long  ([id number], June 14, 1976) on 06/08/21 at 12:00 PM EST by a video-enabled telemedicine application and verified that I am speaking with the correct person using two identifiers.  Location: Patient: Virtual Visit Location Patient: Home Provider: Virtual Visit Location Provider: Home Office   I discussed the limitations of evaluation and  management by telemedicine and the availability of in person appointments. The patient expressed understanding and agreed to proceed.    History of Present Illness: Marc Long is a 45 y.o. who identifies as a male who was assigned male at birth, and is being seen today with symptoms of fatigue, sore throat, weakness, cough that was productive that is now dry. His nasal congestion has been present for 6 days now. Coughed up brown today   He has been sick for the past week  Initially had a fever that has now resolved   He has been using OTC cough medicine over the counter, lozenges and tylenol for relief. He has been taking Vitamin C as well.   He has had 3 negative COVID tests   Problems:  Patient Active Problem List   Diagnosis Date Noted   Stage 3b chronic kidney disease ([place]) 02/21/2021   Dizziness 02/21/2021   Diabetes mellitus due to underlying condition with unspecified complications ([place]) 81/44/8185   Schizophrenia ([place]) 04/06/2018   Severe recurrent major depression without psychotic features ([place]) 12/16/2017   Obesity (BMI 30-39.9) 12/09/2017   Severe bipolar I disorder, most recent episode depressed ([place])    Hypertriglyceridemia 02/05/2017   DM (diabetes mellitus), type 2, uncontrolled 12/12/2016   Suicidal ideations 12/12/2016   Hyperglycemia due to type 2 diabetes mellitus ([place]) 12/12/2016   Obesity, Class III, BMI 40-49.9 (morbid  obesity) ([place])    Type 2 diabetes mellitus without complication, without long-term current use of insulin ([place]) 12/10/2016   Moderate single current episode of major depressive disorder ([place]) 04/15/2016   Diverticulosis of large intestine without hemorrhage 11/07/2015   Calculus of gallbladder without cholecystitis 11/07/2015    Allergies:  Allergies  Allergen Reactions   Fish Allergy Nausea Only   Bee Venom Swelling   Keflex [Cephalexin]     UPSET STOMACH   Relafen [Nabumetone]     Blood in stool   Shellfish Allergy Nausea  Only   Medications:  Current Outpatient Medications:    aspirin EC 81 MG tablet, Take 81 mg by mouth daily., Disp: , Rfl:    atorvastatin (LIPITOR) 10 MG tablet, TAKE 1 TABLET(10 MG) BY MOUTH EVERY DAY AT BEDTIME, Disp: 90 tablet, Rfl: 3   Blood Pressure Monitoring (COMFORT TOUCH BP CUFF/LARGE) MISC, 1 each by Does not apply route once a week., Disp: 1 each, Rfl: 0   buPROPion (WELLBUTRIN XL) 300 MG 24 hr tablet, Take 1 tablet (300 mg total) by mouth daily., Disp: 90 tablet, Rfl: 1   busPIRone (BUSPAR) 15 MG tablet, Take 15 mg by mouth 3 (three) times daily., Disp: , Rfl:    cyclobenzaprine (FLEXERIL) 10 MG tablet, Take 1 tablet (10 mg total) by mouth at bedtime., Disp: 20 tablet, Rfl: 0   fluvoxaMINE (LUVOX) 50 MG tablet, Take 3 tablets (150 mg total) by mouth at bedtime., Disp: 90 tablet, Rfl: 1   hydrOXYzine (ATARAX/VISTARIL) 50 MG tablet, Take 1 tablet (50 mg total) by mouth every 6 (six) hours as needed for anxiety., Disp: 60 tablet, Rfl: 0   JARDIANCE 10 MG TABS tablet, TAKE 1 TABLET BY MOUTH DAILY, Disp: 30 tablet, Rfl: 0   LANTUS SOLOSTAR 100 UNIT/ML Solostar Pen, ADMINISTER 27 UNITS UNDER THE SKIN DAILY AT BEDTIME, Disp: 15 mL, Rfl: 3   lisinopril (ZESTRIL) 20 MG tablet, Take one half tab equal to 10 mg once daily by mouth, Disp: , Rfl:    metFORMIN (GLUCOPHAGE) 1000 MG tablet, Take 1 tablet (1,000 mg total) by mouth 2 (two) times daily with a meal. For diabetes management, Disp: 60 tablet, Rfl: 0   Multiple Vitamin (MULTIVITAMIN WITH MINERALS) TABS tablet, Take 1 tablet by mouth daily., Disp: , Rfl:    QUEtiapine (SEROQUEL) 200 MG tablet, Take 1 tablet (200 mg total) by mouth at bedtime., Disp: 90 tablet, Rfl: 1   traZODone (DESYREL) 50 MG tablet, Take 2 tabs equal 100  mg at bedtime, Disp: 30 tablet, Rfl: 0  Observations/Objective: Patient is well-developed, well-nourished in no acute distress.  Resting comfortably at home.  Head is normocephalic, atraumatic.  No labored breathing.   Speech is clear and coherent with logical content.  Patient is alert and oriented at baseline.    Assessment and Plan: 1. Bronchitis  - benzonatate (TESSALON) 100 MG capsule; Take 1 capsule (100 mg total) by mouth 3 (three) times daily as needed for up to 10 days for cough.  Dispense: 30 capsule; Refill: 0 - azithromycin (ZITHROMAX) 250 MG tablet; Take 2 tablets on day 1, then 1 tablet daily on days 2 through 5  Dispense: 6 tablet; Refill: 0     Follow Up Instructions: I discussed the assessment and treatment plan with the patient. The patient was provided an opportunity to ask questions and all were answered. The patient agreed with the plan and demonstrated an understanding of the instructions.  A copy of instructions were sent  to the patient via MyChart unless otherwise noted below.     The patient was advised to call back or seek an in-person evaluation if the symptoms worsen or if the condition fails to improve as anticipated.  Time:  I spent 15 minutes with the patient via telehealth technology discussing the above problems/concerns.    Apolonio Schneiders, FNP

## 2021-06-14 ENCOUNTER — Other Ambulatory Visit: Payer: Self-pay

## 2021-06-14 MED ORDER — BD PEN NEEDLE NANO 2ND GEN 32G X 4 MM MISC: 3 refills | Status: DC

## 2021-06-14 NOTE — Telephone Encounter (Signed)
Patient returns call to nurse line regarding pen needles. Spoke with Dr. Nori Riis. Received verbal okay to send. Scheduled patient diabetes follow up with PCP on 06/25/21.  Talbot Grumbling, RN

## 2021-06-18 ENCOUNTER — Other Ambulatory Visit: Payer: Self-pay | Admitting: Family Medicine

## 2021-06-25 ENCOUNTER — Ambulatory Visit (INDEPENDENT_AMBULATORY_CARE_PROVIDER_SITE_OTHER): Payer: Medicare Other | Admitting: Family Medicine

## 2021-06-25 ENCOUNTER — Other Ambulatory Visit: Payer: Self-pay

## 2021-06-25 ENCOUNTER — Encounter: Payer: Self-pay | Admitting: Family Medicine

## 2021-06-25 VITALS — BP 129/97 | HR 89 | Wt 309.0 lb

## 2021-06-25 DIAGNOSIS — E781 Pure hyperglyceridemia: Secondary | ICD-10-CM | POA: Diagnosis not present

## 2021-06-25 DIAGNOSIS — F314 Bipolar disorder, current episode depressed, severe, without psychotic features: Secondary | ICD-10-CM

## 2021-06-25 DIAGNOSIS — E119 Type 2 diabetes mellitus without complications: Secondary | ICD-10-CM | POA: Diagnosis present

## 2021-06-25 DIAGNOSIS — F200 Paranoid schizophrenia: Secondary | ICD-10-CM

## 2021-06-25 DIAGNOSIS — R45851 Suicidal ideations: Secondary | ICD-10-CM | POA: Diagnosis not present

## 2021-06-25 LAB — POCT GLYCOSYLATED HEMOGLOBIN (HGB A1C): HbA1c, POC (controlled diabetic range): 7 % (ref 0.0–7.0)

## 2021-06-25 MED ORDER — ATORVASTATIN CALCIUM 40 MG PO TABS: 40.0000 mg | ORAL_TABLET | Freq: Every day | ORAL | 3 refills | Status: DC

## 2021-06-25 NOTE — Patient Instructions (Addendum)
It was great seeing you today!  You came in to follow up on your diabetes and your a1c has decreased to 7 from 7.8 four months ago. Keep up the good work! We will not make adjustments at this time.   For your increased cholesterol we increased your Lipitor to 40mg  a day which I sent in to the pharmacy.   Please be sure to follow up with psychiatry given increased thoughts of harm to ensure no adjustments need to be made to medication. If you need anything or any other resources please reach out to me at the clinic.   I will see you in about 3-4 months for follow up, but if you need to be seen earlier than that for any new issues we're happy to fit you in, just give [place] a call!  Feel free to call with any questions or concerns at any time, at [phone].   Take care,  Dr. Shary Key [facility]

## 2021-06-25 NOTE — Progress Notes (Signed)
° ° °  SUBJECTIVE:   CHIEF COMPLAINT / HPI:   Mr. Caspers is a 46 yo who presents for diabetes follow up with his mother.  He indicates SI on his depression screening which is his baseline though this visit he endorses random thoughts thoughts of slitting his throat, has had those thoughts a couple of times a day, more so recently. Was under increased stress with a money situation which triggered thist.  Saw therapist yesterday, has not seen psychiatrist recently. Denies feeling like he will carry out with this and declines needing to go to behavioral health urgent care. Mother is present with [place] and is very supportive.   DM: Jardiance 10mg  daily, Metformin 1000mg  BID  Denies increased thirst or urination. Denies lighheadedness and syncope  Diet has been ok. Tried intermittent fasting    OBJECTIVE:   BP (!) 129/97    Pulse 89    Wt (!) 309 lb (140.2 kg)    SpO2 98%    BMI 41.91 kg/m    Physical exam General: well appearing, NAD Cardiovascular: RRR, no murmurs Lungs: CTAB. Normal WOB Abdomen: soft, non-distended, non-tender Skin: warm, dry. No edema Psych: mood and affect appropriate. Normal judgement and thought content. Speech non tangential   [employer] Office Visit from 06/25/2021 in [place]  PHQ-9 Total Score 19        ASSESSMENT/PLAN:   No problem-specific Assessment & Plan notes found for this encounter.   Suicidal ideations   Severe bipolar disorder   Schizophrenia  Patient with continued SI. PHQ 19 with positive 9. States he has thoughts of slitting his throat. He expresses increased SI lately due to a financial stressor. Currently taking Seroquel 200mg  daily, trazodone 100mg  at bedtime, Hydroxyzine 50mg  qid prn. Saw therapist yesterday and I advised to follow with his psychiatrist to assess for medication adjustment. Declined [facility]. Mother present and is his support system. Will continue to monitor. Gave strict return precautions.   DM2 A1c  today 7.0, down from 7.8 4 months ago. Stable on Jardiance 10mg  daily, Metformin 1000mg  BID. Will continue current regimen    Hypertriglyceridemia  Triglycerides 230, HDL 30.  Previously taking Atorvastatin 10mg , increased to 40mg . At recheck if Trig still elevated can consider adding a medication   [place]

## 2021-07-08 ENCOUNTER — Telehealth: Payer: Self-pay | Admitting: Family Medicine

## 2021-07-08 NOTE — Telephone Encounter (Signed)
Patient dropped off Medical Record release form to be completed. Last DOS was 06/25/21. Placed in [employer].

## 2021-07-08 NOTE — Telephone Encounter (Signed)
Reviewed form and placed in PCP's box for completion.  .Adelina Collard R Tana Trefry, CMA  

## 2021-07-09 ENCOUNTER — Other Ambulatory Visit: Payer: Self-pay

## 2021-07-10 MED ORDER — LANTUS SOLOSTAR 100 UNIT/ML ~~LOC~~ SOPN: PEN_INJECTOR | SUBCUTANEOUS | 3 refills | Status: DC

## 2021-07-15 ENCOUNTER — Other Ambulatory Visit: Payer: Self-pay | Admitting: Family Medicine

## 2021-07-16 NOTE — Telephone Encounter (Signed)
Forms faxed to appropriate number and a copy was made for batch scanning.

## 2021-07-17 ENCOUNTER — Telehealth: Payer: Self-pay

## 2021-07-17 ENCOUNTER — Ambulatory Visit (INDEPENDENT_AMBULATORY_CARE_PROVIDER_SITE_OTHER): Payer: Medicare Other | Admitting: Family Medicine

## 2021-07-17 ENCOUNTER — Other Ambulatory Visit: Payer: Self-pay

## 2021-07-17 DIAGNOSIS — R45851 Suicidal ideations: Secondary | ICD-10-CM

## 2021-07-17 DIAGNOSIS — Z8679 Personal history of other diseases of the circulatory system: Secondary | ICD-10-CM

## 2021-07-17 NOTE — Progress Notes (Signed)
° ° ° °  SUBJECTIVE:   CHIEF COMPLAINT / HPI:   Marc Long is a 46 y.o. male presents for HTN  Patient's mother was also present at this visit  Hypertension Currently on no medications. Was on lisinopril previously but it was stopped Nov 22 due to symptomatic hypotension.patient was checking BP at home and saw some readings between 148/99 and 150/100. More often than not the Bps are in 220U systolic. Pt reports being stressed recently due to financial reasons thinks that is why his blood pressure has increased in on some of the days. Denies any SOB, CP, vision changes, LE edema, medication SEs, or symptoms of hypotension.   Most recent creatinine trend:  Lab Results  Component Value Date   CREATININE 2.00 (H) 02/01/2021   CREATININE 1.30 (H) 10/21/2020   CREATININE 1.29 (H) 03/22/2019    Patient has had a BMP in the past 1 year.  PHQ Positive question 9 2 x suicide attempt in the past 2018, 2019. Pt reports he has intrusive thoughts such as "killing myself" 3-4 times a day. " I have no interest on acting on them". Pt reports he has visualizations of killing himself. No intention or plans. Pt is seeing a new therapist tomorrow. Complaint with his psychiatric medications. Pt reports he used to have 20-30 intrusive thoughts of killing himself everyday. Protective factors-does not want to, family and friends  Physiological scientist Office Visit from 07/17/2021 in [place]  PHQ-9 Total Score 21       PERTINENT  PMH / PSH: DM, CKD, severe bipolar disorder, schizophrenia   OBJECTIVE:   BP 132/85    Pulse 88    Ht 6' (1.829 m)    Wt (!) 310 lb 9.6 oz (140.9 kg)    SpO2 94%    BMI 42.12 kg/m    Repeat 119/95  General: Alert, no acute distress, pleasant Cardio: well perfused  Pulm: normal work of breathing Neuro: Cranial nerves grossly intact  Psych: Normal mood, normal affect  ASSESSMENT/PLAN:   H/O: HTN (hypertension) Normal BP readings today in clinic x  2. BP at home is normal range the majority of the time. Reassured pt that BP can fluctuate throughout the day and some readings may be higher than others. It can be elevated due to stress which pt currently has. No changes made today given normal BP. Recommended pt keeps BP diary at home twice a day and follows up if BP remains persistently >140/90. Strict ER precautions given to pt. patient expressed understanding and is happy with the plan.  Suicidal ideations Patient endorses suicidal intrusive thoughts without intention or plan.  These thoughts are chronic and have much improved compared to previously.  He continues to see psychiatry and do therapy.  Continue to monitor and offer support. Suicidal hotline number provided.    Lattie Haw, MD PGY-3 [place]

## 2021-07-17 NOTE — Telephone Encounter (Signed)
Patient calls nurse line reporting continued elevated blood pressure. Patient reports on 1/30-14/98 and 1/31-150/100. Patient reports he was on Lisinopril, however found the medication to made him dizzy and was discontinued. Patient reports he is currently not prescribed a BP med at this time. Patient denies chest pain, SOB, vision changes, headaches or dizziness. Patient reports BP this morning was 134/92.  Patient scheduled for this afternoon for evaluation.   Red flags discussed in the meantime.

## 2021-07-17 NOTE — Patient Instructions (Signed)
Thank you for coming to see me today. It was a pleasure. Today we discussed your BP. It is normal. I recommend continuing to monitor. If it goes consistently >150 or you have headaches, vision changes etc then go to the ER. Dangerous Bps are >180-200.  Please follow-up with PCP as needed  If you have any questions or concerns, please do not hesitate to call the office at [phone].  Best wishes,   Dr Posey Pronto    If you are feeling suicidal or depression symptoms worsen please immediately go to:   If you are thinking about harming yourself or having thoughts of suicide, or if you know someone who is, seek help right away. If you are in crisis, make sure you are not left alone.  If someone else is in crisis, make sure he/she/they is not left alone  Call 988 OR 1-800-273-TALK  24 Hour Availability for [place]  [address] [place], [place] [facility] [phone]    Other crisis resources:  [facility] [employer] (Domestic Violence, Rape & Victim Assistance [phone]  [facility]    (ONLY from 8am-4pm)    [phone]  [facility] (24/7)   [phone]  [place] National Suicide Hotline   [phone] Diamantina Monks)

## 2021-07-18 DIAGNOSIS — Z8679 Personal history of other diseases of the circulatory system: Secondary | ICD-10-CM | POA: Insufficient documentation

## 2021-07-18 HISTORY — DX: Personal history of other diseases of the circulatory system: Z86.79

## 2021-07-18 NOTE — Assessment & Plan Note (Addendum)
Normal BP readings today in clinic x 2. BP at home is normal range the majority of the time. Reassured pt that BP can fluctuate throughout the day and some readings may be higher than others. It can be elevated due to stress which pt currently has. No changes made today given normal BP. Recommended pt keeps BP diary at home twice a day and follows up if BP remains persistently >140/90. Strict ER precautions given to pt. patient expressed understanding and is happy with the plan.

## 2021-07-19 NOTE — Assessment & Plan Note (Signed)
Patient endorses suicidal intrusive thoughts without intention or plan.  These thoughts are chronic and have much improved compared to previously.  He continues to see psychiatry and do therapy.  Continue to monitor and offer support. Suicidal hotline number provided.

## 2021-08-28 ENCOUNTER — Other Ambulatory Visit: Payer: Self-pay

## 2021-08-29 MED ORDER — METFORMIN HCL 1000 MG PO TABS: 1000.0000 mg | ORAL_TABLET | Freq: Two times a day (BID) | ORAL | 0 refills | Status: DC

## 2021-08-30 ENCOUNTER — Other Ambulatory Visit: Payer: Self-pay | Admitting: Family Medicine

## 2021-08-30 MED ORDER — METFORMIN HCL 1000 MG PO TABS: 1000.0000 mg | ORAL_TABLET | Freq: Two times a day (BID) | ORAL | 0 refills | Status: DC

## 2021-08-30 NOTE — Telephone Encounter (Signed)
Patient calls nurse line regarding metformin rx. Original rx was set to "print."  ? ?Resent prescription electronically. Receipt confirmed on 3/17 at 0923. ? ?Talbot Grumbling, RN ? ?

## 2021-08-30 NOTE — Addendum Note (Signed)
Addended by: Talbot Grumbling on: 08/30/2021 09:24 AM ? ? Modules accepted: Orders ? ?

## 2021-09-04 ENCOUNTER — Other Ambulatory Visit: Payer: Self-pay | Admitting: Family Medicine

## 2021-09-18 ENCOUNTER — Other Ambulatory Visit: Payer: Self-pay | Admitting: Family Medicine

## 2021-09-30 ENCOUNTER — Other Ambulatory Visit: Payer: Self-pay | Admitting: Family Medicine

## 2021-10-01 ENCOUNTER — Other Ambulatory Visit: Payer: Self-pay | Admitting: Family Medicine

## 2021-10-16 NOTE — Progress Notes (Signed)
? ? ?SUBJECTIVE:  ? ?CHIEF COMPLAINT / HPI:  ? ?Shoulder Pain  ?Marc Long is a 46 y.o. male who presents to the [facility] clinic today to discuss bilateral shoulder pain for the last 1-2 months. He feels it daily but intermittently. It is exacerbated when his arms are extended down to his side and when lifting overhead. He has history of shoulder dislocations 2x (first two were basketball related during teenage years). Most recent time was around 2004. States when he moves them certain positions "they don't want to move that way" and sometimes it feels like they are going to pop out of the joint. No injuries recently. Takes Tylenol for the pain given his CKD. Has not seen Ortho for his shoulders. Hurts to change/get dressed but he is able to do it and do his normal daily activities. No changes to sensation. No edema. ? ?SI ?Has a Teacher, music and a Social worker. Spoke with his counselor earlier today and has an appointment next week with counselor and Psychiatrist. Has thoughts of overdosing on Metformin about 4-5 times a day. ?States that he has an obsessive component to his personality ?On white board in his room he has the number to the suicide hotline number and his therapist who he reaches out to when needed. ? ? ?PERTINENT  PMH / PSH: T2DM, CKD stage 3b, HLD, HTN, schizophrenia  ? ?OBJECTIVE:  ? ?BP 108/82   Pulse 82   Ht 6' (1.829 m)   Wt (!) 312 lb (141.5 kg)   SpO2 97%   BMI 42.31 kg/m?   ? ?General: NAD, depressed affect, able to participate in exam ?Right Shoulder: ?Inspection reveals no obvious deformity, atrophy, or asymmetry b/l. No bruising. No swelling ?Tenderness to bicipital groove and anterior shoulder on palpation. No AC joint tenderness. ?Full active ROM in flexion though slowed 2/2 pain, abduction, internal/external rotation b/l ?NV intact distally b/l ?Normal scapular function observed b/l ?Special Tests:  ?- Impingement: Positive Hawkins ?- Supraspinatous:  Positive empty can ?-  Infraspinatous/Teres Minor: 5/5 strength with ER ?- Subscapularis: 5/5 strength with IR ?- Biceps tendon: Negative Yerrgason's  ?- Labrum: Negative clunk, good stability ?- AC Joint: Negative cross arm ?- Negative apprehension test ?- Painful arc, no drop sign ?Left Shoulder: ?Inspection reveals no obvious deformity, atrophy, or asymmetry b/l. No bruising. No swelling ?TTP over anterior shoulder and bicipital groove ?Full ROM in flexion, abduction, internal/external rotation b/l though slowed 2/2 pain ?NV intact distally b/l ?Normal scapular function observed b/l ?Special Tests:  ?- Impingement: Neg Hawkins ?- Supraspinatous: Negative empty can ?- Infraspinatous/Teres Minor: 5/5 strength with ER ?- Subscapularis: 5/5 strength with IR ?- Biceps tendon: Negative Speeds, Yerrgason's  ?- Labrum: Negative clunk, good stability ?- AC Joint: Negative cross arm ?- Negative apprehension test ?- Painful arc, no drop sign ?Psych: Normal affect and mood ? ?ASSESSMENT/PLAN:  ? ?1. Bilateral shoulder pain, unspecified chronicity ?For the last 1-2 months without precipitating injury. Examination notable for tenderness to anterior shoulder and bicipital groove b/l. Despite feelings of possible subluxation, stability of shoulder appears intact. Neurovascularly intact. Do not feel strongly about imaging at this point given no significant injury. Will trial conservative measures with shoulder exercises (hand out provided along with theraband) and pain control with Tylenol/ice/Voltaren gel as needed. Avoid heavy lifting but continue to mobilize shoulder. Can consider formal PT referral +/- imaging and referral to Sports Med/Ortho if worsens or persistent despite conservative measures. ? ?2. Suicidal ideation ?Chronic for patient. He is followed  by both a counselor and a psychiatrist.  Unfortunately he experiences suicidal ideations multiple times a day.  Reports that his current plan is to overdose on metformin, however he is able to  combat these thoughts by going to bed typically. He has a roommate that he confides in and has family nearby. Endorses good support system. Safety planning: Patient states he will try to go to sleep when he has these thoughts of self-harm. If he continues to have them, he will reach out to the suicide hotline (which he has memorized and written on white board at home). Mother also lives 1 block away. Lives with a roommate who owns firearms but patient does not know where they are. He has close follow up with Psychiatrist and counselor next week.  ? ?Sharion Settler, DO ?[place]  ? ?

## 2021-10-17 ENCOUNTER — Ambulatory Visit (INDEPENDENT_AMBULATORY_CARE_PROVIDER_SITE_OTHER): Payer: Medicare Other | Admitting: Family Medicine

## 2021-10-17 VITALS — BP 108/82 | HR 82 | Ht 72.0 in | Wt 312.0 lb

## 2021-10-17 DIAGNOSIS — M25511 Pain in right shoulder: Secondary | ICD-10-CM | POA: Diagnosis not present

## 2021-10-17 DIAGNOSIS — M25512 Pain in left shoulder: Secondary | ICD-10-CM

## 2021-10-17 DIAGNOSIS — R45851 Suicidal ideations: Secondary | ICD-10-CM | POA: Diagnosis not present

## 2021-10-17 MED ORDER — DICLOFENAC SODIUM 1 % EX GEL: 4.0000 g | CUTANEOUS | 0 refills | Status: DC | PRN

## 2021-10-17 NOTE — Patient Instructions (Addendum)
It was wonderful to see you today.  Please bring ALL of your medications with you to every visit.   Today we talked about:  Shoulder Exercises Ask your health care provider which exercises are safe for you. Do exercises exactly as told by your health care provider and adjust them as directed. It is normal to feel mild stretching, pulling, tightness, or discomfort as you do these exercises. Stop right away if you feel sudden pain or your pain gets worse. Do not begin these exercises until told by your health care provider. Stretching exercises External rotation and abduction This exercise is sometimes called corner stretch. This exercise rotates your arm outward (external rotation) and moves your arm out from your body (abduction). Stand in a doorway with one of your feet slightly in front of the other. This is called a staggered stance. If you cannot reach your forearms to the door frame, stand facing a corner of a room. Choose one of the following positions as told by your health care provider: Place your hands and forearms on the door frame above your head. Place your hands and forearms on the door frame at the height of your head. Place your hands on the door frame at the height of your elbows. Slowly move your weight onto your front foot until you feel a stretch across your chest and in the front of your shoulders. Keep your head and chest upright and keep your abdominal muscles tight. Hold for __________ seconds. To release the stretch, shift your weight to your back foot. Repeat __________ times. Complete this exercise __________ times a day. Extension, standing Stand and hold a broomstick, a cane, or a similar object behind your back. Your hands should be a little wider than shoulder width apart. Your palms should face away from your back. Keeping your elbows straight and your shoulder muscles relaxed, move the stick away from your body until you feel a stretch in your shoulders  (extension). Avoid shrugging your shoulders while you move the stick. Keep your shoulder blades tucked down toward the middle of your back. Hold for __________ seconds. Slowly return to the starting position. Repeat __________ times. Complete this exercise __________ times a day. Range-of-motion exercises Pendulum  Stand near a wall or a surface that you can hold onto for balance. Bend at the waist and let your left / right arm hang straight down. Use your other arm to support you. Keep your back straight and do not lock your knees. Relax your left / right arm and shoulder muscles, and move your hips and your trunk so your left / right arm swings freely. Your arm should swing because of the motion of your body, not because you are using your arm or shoulder muscles. Keep moving your hips and trunk so your arm swings in the following directions, as told by your health care provider: Side to side. Forward and backward. In clockwise and counterclockwise circles. Continue each motion for __________ seconds, or for as long as told by your health care provider. Slowly return to the starting position. Repeat __________ times. Complete this exercise __________ times a day. Shoulder flexion, standing  Stand and hold a broomstick, a cane, or a similar object. Place your hands a little more than shoulder width apart on the object. Your left / right hand should be palm up, and your other hand should be palm down. Keep your elbow straight and your shoulder muscles relaxed. Push the stick up with your healthy arm to  raise your left / right arm in front of your body, and then over your head until you feel a stretch in your shoulder (flexion). Avoid shrugging your shoulder while you raise your arm. Keep your shoulder blade tucked down toward the middle of your back. Hold for __________ seconds. Slowly return to the starting position. Repeat __________ times. Complete this exercise __________ times a  day. Shoulder abduction, standing Stand and hold a broomstick, a cane, or a similar object. Place your hands a little more than shoulder width apart on the object. Your left / right hand should be palm up, and your other hand should be palm down. Keep your elbow straight and your shoulder muscles relaxed. Push the object across your body toward your left / right side. Raise your left / right arm to the side of your body (abduction) until you feel a stretch in your shoulder. Do not raise your arm above shoulder height unless your health care provider tells you to do that. If directed, raise your arm over your head. Avoid shrugging your shoulder while you raise your arm. Keep your shoulder blade tucked down toward the middle of your back. Hold for __________ seconds. Slowly return to the starting position. Repeat __________ times. Complete this exercise __________ times a day. Internal rotation  Place your left / right hand behind your back, palm up. Use your other hand to dangle an exercise band, a towel, or a similar object over your shoulder. Grasp the band with your left / right hand so you are holding on to both ends. Gently pull up on the band until you feel a stretch in the front of your left / right shoulder. The movement of your arm toward the center of your body is called internal rotation. Avoid shrugging your shoulder while you raise your arm. Keep your shoulder blade tucked down toward the middle of your back. Hold for __________ seconds. Release the stretch by letting go of the band and lowering your hands. Repeat __________ times. Complete this exercise __________ times a day. Strengthening exercises External rotation  Sit in a stable chair without armrests. Secure an exercise band to a stable object at elbow height on your left / right side. Place a soft object, such as a folded towel or a small pillow, between your left / right upper arm and your body to move your elbow about 4  inches (10 cm) away from your side. Hold the end of the exercise band so it is tight and there is no slack. Keeping your elbow pressed against the soft object, slowly move your forearm out, away from your abdomen (external rotation). Keep your body steady so only your forearm moves. Hold for __________ seconds. Slowly return to the starting position. Repeat __________ times. Complete this exercise __________ times a day. Shoulder abduction  Sit in a stable chair without armrests, or stand up. Hold a __________ weight in your left / right hand, or hold an exercise band with both hands. Start with your arms straight down and your left / right palm facing in, toward your body. Slowly lift your left / right hand out to your side (abduction). Do not lift your hand above shoulder height unless your health care provider tells you that this is safe. Keep your arms straight. Avoid shrugging your shoulder while you do this movement. Keep your shoulder blade tucked down toward the middle of your back. Hold for __________ seconds. Slowly lower your arm, and return to the starting position.  Repeat __________ times. Complete this exercise __________ times a day. Shoulder extension Sit in a stable chair without armrests, or stand up. Secure an exercise band to a stable object in front of you so it is at shoulder height. Hold one end of the exercise band in each hand. Your palms should face each other. Straighten your elbows and lift your hands up to shoulder height. Step back, away from the secured end of the exercise band, until the band is tight and there is no slack. Squeeze your shoulder blades together as you pull your hands down to the sides of your thighs (extension). Stop when your hands are straight down by your sides. Do not let your hands go behind your body. Hold for __________ seconds. Slowly return to the starting position. Repeat __________ times. Complete this exercise __________ times a  day. Shoulder row Sit in a stable chair without armrests, or stand up. Secure an exercise band to a stable object in front of you so it is at waist height. Hold one end of the exercise band in each hand. Position your palms so that your thumbs are facing the ceiling (neutral position). Bend each of your elbows to a 90-degree angle (right angle) and keep your upper arms at your sides. Step back until the band is tight and there is no slack. Slowly pull your elbows back behind you. Hold for __________ seconds. Slowly return to the starting position. Repeat __________ times. Complete this exercise __________ times a day. Shoulder press-ups  Sit in a stable chair that has armrests. Sit upright, with your feet flat on the floor. Put your hands on the armrests so your elbows are bent and your fingers are pointing forward. Your hands should be about even with the sides of your body. Push down on the armrests and use your arms to lift yourself off the chair. Straighten your elbows and lift yourself up as much as you comfortably can. Move your shoulder blades down, and avoid letting your shoulders move up toward your ears. Keep your feet on the ground. As you get stronger, your feet should support less of your body weight as you lift yourself up. Hold for __________ seconds. Slowly lower yourself back into the chair. Repeat __________ times. Complete this exercise __________ times a day. Wall push-ups  Stand so you are facing a stable wall. Your feet should be about one arm-length away from the wall. Lean forward and place your palms on the wall at shoulder height. Keep your feet flat on the floor as you bend your elbows and lean forward toward the wall. Hold for __________ seconds. Straighten your elbows to push yourself back to the starting position. Repeat __________ times. Complete this exercise __________ times a day. This information is not intended to replace advice given to you by your  health care provider. Make sure you discuss any questions you have with your health care provider. Document Revised: 05/28/2021 Document Reviewed: 07/02/2018 Elsevier Patient Education  2023 [employer].    Thank you for choosing [facility].   Please call [phone] with any questions about today's appointment.  Please be sure to schedule follow up at the front  desk before you leave today.   Sabino Dick, DO PGY-2 Family Medicine

## 2021-11-06 ENCOUNTER — Other Ambulatory Visit: Payer: Self-pay | Admitting: Family Medicine

## 2021-11-19 ENCOUNTER — Ambulatory Visit (INDEPENDENT_AMBULATORY_CARE_PROVIDER_SITE_OTHER): Payer: Medicare Other | Admitting: Family Medicine

## 2021-11-19 ENCOUNTER — Encounter: Payer: Self-pay | Admitting: *Deleted

## 2021-11-19 ENCOUNTER — Other Ambulatory Visit: Payer: Self-pay

## 2021-11-19 ENCOUNTER — Encounter: Payer: Self-pay | Admitting: Family Medicine

## 2021-11-19 VITALS — BP 114/85 | HR 75 | Wt 312.0 lb

## 2021-11-19 DIAGNOSIS — M25512 Pain in left shoulder: Secondary | ICD-10-CM | POA: Diagnosis not present

## 2021-11-19 DIAGNOSIS — E119 Type 2 diabetes mellitus without complications: Secondary | ICD-10-CM | POA: Diagnosis not present

## 2021-11-19 DIAGNOSIS — M25511 Pain in right shoulder: Secondary | ICD-10-CM

## 2021-11-19 DIAGNOSIS — G8929 Other chronic pain: Secondary | ICD-10-CM

## 2021-11-19 LAB — POCT GLYCOSYLATED HEMOGLOBIN (HGB A1C): HbA1c, POC (controlled diabetic range): 7 % (ref 0.0–7.0)

## 2021-11-19 MED ORDER — SEMAGLUTIDE(0.25 OR 0.5MG/DOS) 2 MG/1.5ML ~~LOC~~ SOPN: 0.2500 mg | PEN_INJECTOR | SUBCUTANEOUS | 0 refills | Status: AC

## 2021-11-19 NOTE — Progress Notes (Signed)
    SUBJECTIVE:   CHIEF COMPLAINT / HPI:   Patient states he is still having shoulder and back pain. Was seen on 5/4 for this concern.  Was doing the shoulder and back exercises once daily that was discussed at previous visit. He states shoulder and back have been bothering him for several months. No inciting injury or fall. Pain worse with certain positions. Tried taking Tylenol and volataren gel. Interested in referral to specialist. Not interested in PT at this time.   Mood is the same with regards to SI. Still have thoughts of harming himself but not actually wanting to carry out with it. Had to cancel Psych appointment this week because of this appointment but has appointment next week. States medications are the same except for Trazodone which was decreased from '100mg'$  to '50mg'$ . Decreased because of sexual side effects.   A1c 7.0. Eating healthier meals, incorporating more fruits and vegetables   PERTINENT  PMH / PSH: Reviewed   OBJECTIVE:   BP 114/85   Pulse 75   Wt (!) 312 lb (141.5 kg)   SpO2 95%   BMI 42.31 kg/m    General: alert, pleasant, NAD CV: RRR no murmurs Resp: CTAB normal WOB GI: soft, non distended  Derm: warm, dry, no edema Psych: normal affect and mood. Thought content appropriate. Speech non tangential  MSK:  R and L Shoulder: Inspection reveals no obvious deformity, atrophy, or asymmetry b/l. No bruising. No swelling Palpation: TTP over anterior shoulder  bicipital groove b/l. Reduced ROM in flexion, abduction, internal/external rotation b/l NV intact distally b/l Special Tests:  - Painful arc and no drop arm sign   ASSESSMENT/PLAN:   Shoulder pain Patient presents for follow up on chronic bilateral shoulder pain. Conservative measurements of stretches, pain medication, ice/heat have not helped. Declines PT at this time, referred to sports medicine clinic for further evaluation     Type 2 diabetes mellitus without complication, without long-term  current use of insulin (HCC) A1c 7.0, stable from 4 months ago. Stable on Jardiance '10mg'$  daily, Metformin '1000mg'$  BID.Will continue current regimen and add Ozempic .'25mg'$  weekly to also hopefully aid in weight loss. Will follow up in 1 month and increase dose if tolerating medication.    Follow up in 1 month. Will check kidney function at that time as well   Shary Key, [place]

## 2021-11-19 NOTE — Patient Instructions (Addendum)
It was great seeing you today!  You came in for continued back and shoulder pain, and I have referred you to sports medicine clinic.  They will reach out to you to schedule an appointment in the next 2 weeks, but if you do not hear back please let us know.  I have also prescribed Ozempic 0.25 mg weekly to help with your diabetes and your weight.  Take this the same day each week but alternating location of injection.  Continue your healthy eating and working out in combination with this medication for best results.  Please check-out at the front desk before leaving the clinic. I'd like to see you back in 1 month for follow-up of medication, but if you need to be seen earlier than that for any new issues we're happy to fit you in, just give [place] a call!  Visit Reminders: - Stop by the pharmacy to pick up your prescriptions  - Continue to work on your healthy eating habits and incorporating exercise into your daily life.   Feel free to call with any questions or concerns at any time, at [phone].   Take care,  Dr. Shary Key [facility]

## 2021-11-21 NOTE — Assessment & Plan Note (Signed)
Patient presents for follow up on chronic bilateral shoulder pain. Conservative measurements of stretches, pain medication, ice/heat have not helped. Declines PT at this time, referred to sports medicine clinic for further evaluation

## 2021-11-21 NOTE — Assessment & Plan Note (Addendum)
A1c 7.0, stable from 4 months ago. Stable on Jardiance '10mg'$  daily, Metformin '1000mg'$  BID.Will continue current regimen and add Ozempic .'25mg'$  weekly to also hopefully aid in weight loss. Will follow up in 1 month and increase dose if tolerating medication.

## 2021-11-27 ENCOUNTER — Ambulatory Visit (INDEPENDENT_AMBULATORY_CARE_PROVIDER_SITE_OTHER): Payer: Medicare Other | Admitting: Family Medicine

## 2021-11-27 VITALS — BP 108/71 | Ht 72.0 in | Wt 308.0 lb

## 2021-11-27 DIAGNOSIS — G8929 Other chronic pain: Secondary | ICD-10-CM | POA: Insufficient documentation

## 2021-11-27 DIAGNOSIS — M25511 Pain in right shoulder: Secondary | ICD-10-CM

## 2021-11-27 DIAGNOSIS — M25512 Pain in left shoulder: Secondary | ICD-10-CM

## 2021-11-27 NOTE — Assessment & Plan Note (Signed)
Chronic, intermittent pain in both shoulders though R>L. History and examination point more towards overuse injury such as bursitis, tendinitis, or possibly arthritis. Will start with x-rays to assess for arthritic component. D/w patient that he can return for b/l shoulder U/S. He should continue with conservative therapies in the meantime.  -Continue home exercises; provided with rotator cuff exercises -Continue voltaren gel; avoiding oral NSAIDs due to renal function  -F/u in 2 weeks for U/S

## 2021-11-27 NOTE — Progress Notes (Signed)
   Marc Long is a 46 y.o. male who presents to [facility] today for the following:  Bilateral shoulder pain Patient reports shoulder pain in both shoulders that has been occurring for several months, seen by PCP for this in May and June Was given home exercises Reports prior history of 2 shoulder dislocations in his teenage years No recent radiographs  Patient reports history of right shoulder dislocation back in high school.  He has also noted about 8 episodes of subluxation in both shoulders, right greater than left.  He actually was doing well for a while but thinks the shoulder pain flared up starting in December after he helped his mom move in. Reports that he continues to have pain that is worse when squeezing his left bicep, raising his shoulders up, and with adduction. It is intermittent but daily. He did try the shoulder exercises provided to him previously but was only able to do it for about 2 weeks due to the pain and soreness. He has been taking Tylenol for the pain but is not getting relief. Reports that he cannot take NSAIDs. The pain does not awaken him from sleep but he says it is hard to get into a good position to sleep because of the pain.  He describes the pain as a dull pain that progresses to a sharp pain.    PMH reviewed.  ROS as above. Medications reviewed.  Exam:  BP 108/71   Ht 6' (1.829 m)   Wt (!) 308 lb (139.7 kg)   BMI 41.77 kg/m  Gen: Well NAD MSK:  Bilateral Shoulders: Inspection reveals no obvious deformity, atrophy, or asymmetry b/l. No bruising. No swelling Palpation is normal with no TTP over [facility] joint or bicipital groove b/l. Decreased AROM in flexion by 20 degrees on right, 10 degrees on left, abduction to 90 degrees b/l. Full AROM in internal rotation in left arm, decreased in right arm to back pocket. Decreased external rotation right arm 2/2 discomfort, improved in left arm.  NV intact distally b/l Special Tests:  - Impingement: Pos Hawkins,  neers, equivocal empty can on right. Negative on left.  - Supraspinatous: Negative empty can left arm, equivocal right.  - Infraspinatous/Teres Minor: 5/5 strength with ER with pain - Subscapularis: 5/5 strength with IR with pain - Biceps tendon: Negative Speeds, Yerrgason's, hook  - Labrum: Negative Obriens, negative clunk, good stability  Assessment and Plan: 1) Chronic pain of both shoulders Chronic, intermittent pain in both shoulders though R>L. History and examination point more towards overuse injury such as bursitis, tendinitis, or possibly arthritis. Will start with x-rays to assess for arthritic component. D/w patient that he can return for b/l shoulder U/S. He should continue with conservative therapies in the meantime.  -Continue home exercises; provided with rotator cuff exercises -Continue voltaren gel; avoiding oral NSAIDs due to renal function  -F/u in 2 weeks for U/S  Sharion Settler PGY-2 Family Medicine    Sports Medicine Fellow Addendum   I have independently interviewed and examined the patient. I have discussed the above with the original author and agree with their documentation. My edits for correction/addition/clarification have been made.    [place] Constable, D.O. PGY-4, [facility] Sports Medicine  Addendum:  I was the preceptor for this visit and available for immediate consultation.  [name] MD [place] Boys

## 2021-11-27 NOTE — Patient Instructions (Signed)
Thank you for coming to see me today. It was a pleasure. Today we talked about:   I have placed an order for x-rays of your shoulders.  Please go to [facility] to have this completed.  You do not need an appointment.  We will talk with you about this at your next appointment.   Do your exercises at least 5 times a week.  Use voltaren gel four times a day on your shoulder.  Please follow-up with [place] in 2 weeks for ultrasounds of your shoulders.  If you have any questions or concerns, please do not hesitate to call the office at [phone].  Best,   [place] Constable, DO [place]

## 2021-11-27 NOTE — Progress Notes (Deleted)
   Marc Long is a 46 y.o. male who presents to [facility] today for the following:  Bilateral shoulder pain Patient reports shoulder pain in both shoulders that has been occurring for several months, seen by PCP for this in May and June Was given home exercises Reports prior history of 2 shoulder dislocations in his teenage years No recent radiographs ***   PMH reviewed. *** ROS as above. Medications reviewed.  Exam:  There were no vitals taken for this visit. Gen: Well NAD MSK:  Bilateral Shoulders: Inspection reveals no obvious deformity, atrophy, or asymmetry b/l. No bruising. No swelling Palpation is normal with no TTP over [facility] joint or bicipital groove b/l. Full ROM in flexion, abduction, internal/external rotation b/l NV intact distally b/l Normal scapular function observed b/l Special Tests:  - Impingement: Neg Hawkins, neers, empty can sign. - Supraspinatous: Negative empty can - Infraspinatous/Teres Minor: 5/5 strength with ER - Subscapularis: 5/5 strength with IR - Biceps tendon: Negative Speeds, Yerrgason's  - Labrum: Negative Obriens, negative clunk, good stability - AC Joint: Negative cross arm - Negative apprehension test - No painful arc and no drop arm sign   No results found.   Assessment and Plan: 1) No problem-specific Assessment & Plan notes found for this encounter.   [place] Constable, D.O.  PGY-4 [facility] Sports Medicine  11/27/2021 8:20 AM

## 2021-12-06 ENCOUNTER — Other Ambulatory Visit: Payer: Self-pay | Admitting: Family Medicine

## 2021-12-09 ENCOUNTER — Ambulatory Visit
Admission: RE | Admit: 2021-12-09 | Discharge: 2021-12-09 | Disposition: A | Discharge status: Home / Self Care | Payer: Medicare Other | Source: Ambulatory Visit | Attending: Family Medicine | Admitting: Family Medicine

## 2021-12-09 DIAGNOSIS — G8929 Other chronic pain: Secondary | ICD-10-CM

## 2021-12-11 ENCOUNTER — Ambulatory Visit: Payer: Self-pay

## 2021-12-11 ENCOUNTER — Ambulatory Visit (INDEPENDENT_AMBULATORY_CARE_PROVIDER_SITE_OTHER): Payer: Medicare Other | Admitting: Family Medicine

## 2021-12-11 VITALS — BP 110/80 | Ht 72.0 in | Wt 302.0 lb

## 2021-12-11 DIAGNOSIS — M25511 Pain in right shoulder: Secondary | ICD-10-CM

## 2021-12-11 DIAGNOSIS — G8929 Other chronic pain: Secondary | ICD-10-CM

## 2021-12-11 DIAGNOSIS — M25512 Pain in left shoulder: Secondary | ICD-10-CM | POA: Diagnosis not present

## 2021-12-11 NOTE — Assessment & Plan Note (Signed)
No arthropathy on x-rays or concerning findings on ultrasound today.  Discussed with patient that this is likely rotator cuff tendinitis and will continue to improve.  Recommend continuation of home exercises.  We will have him follow-up in 6 to 8 weeks and continue Voltaren gel in the meantime.

## 2021-12-11 NOTE — Progress Notes (Unsigned)
Marc Long is a 46 y.o. male who presents to [facility] today for the following:  Chronic bilateral shoulder pain Patient had x-rays that did not show any significant degenerative changes He presents today for ultrasound-guided evaluations of his bilateral shoulders At the last visit he was advised to use Voltaren gel and given rotator cuff exercises Has been doing HEP and finds that he is making some improvements   PMH reviewed.  ROS as above. Medications reviewed.  Exam:  BP 110/80   Ht 6' (1.829 m)   Wt (!) 302 lb (137 kg)   BMI 40.96 kg/m  Gen: Well NAD MSK:  Bilateral shoulders: Inspection reveals no obvious deformity, atrophy, or asymmetry b/l. No bruising. No swelling Palpation is normal with no TTP over [facility] joint or bicipital groove b/l. Full ROM in flexion, abduction, internal/external rotation b/l, with pain at extreme of all ranges of motion NV intact distally b/l Normal scapular function observed b/l Special Tests:  - Impingement: Positive Hawkins bilaterally - Supraspinatous: Negative empty can bilaterally - Infraspinatous/Teres Minor: 5/5 strength with ER bilaterally - Subscapularis: 5/5 strength with IR bilaterally - Biceps tendon: Negative Speeds - Labrum: Negative Obriens - AC Joint: Negative cross arm   Independently reviewed shoulder x-rays: Bilateral shoulder x-rays performed on 6/27 that are without any acute abnormality or arthropathy.  ULTRASOUND: Shoulder, Right  Diagnostic complete ultrasound imaging obtained of patient's right shoulder.  - No obvious evidence of bony deformity or osteophyte development appreciated.  - Long head of the biceps tendon: No evidence of tendon thickening, calcification, subluxation, or tearing in short or long axis views. No edema or bullseye sign.  - Pec major insertion visualized without abnormality. - Subscapularis tendon: complete visualization across the width of the insertion point yielded no evidence of  tendon thickening, calcification, or tears in the long axis view.  - Supraspinatus tendon: complete visualization across the width of the insertion point yielded no evidence of tendon thickening, calcification, or tears in the long axis view. No bursal inflammation appreciated.  - Infraspinatus and teres minor tendons: visualization across the width of the insertion points yielded no evidence of tendon thickening, calcification, or tears in the long axis view.  [facility] Joint: No evidence of joint separation, collapse, or osteophyte development appreciated. No effusion present.  - Posterior Glenohumeral Joint visualized without abnormality. IMPRESSION: essentially normal shoulder [place]  ULTRASOUND: Shoulder, Left  Diagnostic complete ultrasound imaging obtained of patient's left shoulder.  - No obvious evidence of bony deformity or osteophyte development appreciated.  - Long head of the biceps tendon: No evidence of tendon thickening, calcification, subluxation, or tearing in short or long axis views. No edema or bullseye sign.  - Pec major insertion visualized without abnormality. - Subscapularis tendon: complete visualization across the width of the insertion point yielded no evidence of tendon thickening, calcification, or tears in the long axis view.  - Supraspinatus tendon: complete visualization across the width of the insertion point yielded no evidence of tendon thickening, calcification, or tears in the long axis view. Mild bursal inflammation appreciated.  - Infraspinatus and teres minor tendons: visualization across the width of the insertion points yielded no evidence of tendon thickening, calcification, or tears in the long axis view.  [facility] Joint: No evidence of joint separation, collapse, or osteophyte development appreciated. No effusion present.  - Posterior Glenohumeral Joint visualized without abnormality. IMPRESSION: findings consistent with mild subacromial bursitis     Assessment  and Plan: 1) Chronic pain of  both shoulders No arthropathy on x-rays or concerning findings on ultrasound today.  Discussed with patient that this is likely rotator cuff tendinitis and will continue to improve.  Recommend continuation of home exercises.  We will have him follow-up in 6 to 8 weeks and continue Voltaren gel in the meantime.   [place] Constable, D.O.  PGY-4 [facility]  12/11/2021 4:10 PM  Addendum:  I was the preceptor for this visit and available for immediate consultation.  [name] MD Kirt Boys

## 2021-12-19 ENCOUNTER — Encounter: Payer: Self-pay | Admitting: Family Medicine

## 2021-12-19 ENCOUNTER — Other Ambulatory Visit: Payer: Self-pay

## 2021-12-19 ENCOUNTER — Ambulatory Visit (INDEPENDENT_AMBULATORY_CARE_PROVIDER_SITE_OTHER): Payer: Medicare Other | Admitting: Family Medicine

## 2021-12-19 VITALS — BP 125/87 | HR 88 | Wt 307.8 lb

## 2021-12-19 DIAGNOSIS — Z794 Long term (current) use of insulin: Secondary | ICD-10-CM | POA: Diagnosis not present

## 2021-12-19 DIAGNOSIS — E119 Type 2 diabetes mellitus without complications: Secondary | ICD-10-CM

## 2021-12-19 DIAGNOSIS — R45851 Suicidal ideations: Secondary | ICD-10-CM | POA: Diagnosis not present

## 2021-12-19 DIAGNOSIS — N183 Chronic kidney disease, stage 3 unspecified: Secondary | ICD-10-CM

## 2021-12-19 MED ORDER — METFORMIN HCL 1000 MG PO TABS: 1000.0000 mg | ORAL_TABLET | Freq: Every day | ORAL | 0 refills | Status: DC

## 2021-12-19 NOTE — Progress Notes (Deleted)
    SUBJECTIVE:   CHIEF COMPLAINT / HPI:   Was added back on Lisinopril by Nephrologist 2.'5mg'$   Will shcedule follow up   PERTINENT  PMH / PSH: ***  OBJECTIVE:   BP 125/87   Pulse 88   Wt (!) 307 lb 12.8 oz (139.6 kg)   SpO2 98%   BMI 41.75 kg/m   ***  ASSESSMENT/PLAN:   No problem-specific Assessment & Plan notes found for this encounter.     [place]

## 2021-12-19 NOTE — Progress Notes (Signed)
    SUBJECTIVE:   CHIEF COMPLAINT / HPI:   Marc Long is a 46 yo who presents for follow up. He was started on Ozempic about a month ago. Has been tolerating it well without side effects. Just started on the increased 0.'5mg'$  dose yesterday .   Additionally he states he has had worsening of his intrusive thoughts. About a week ago felt that he was going to overdose on Metformin but he was able to give his pills to his roommate. Did not carry this out. Has been talking to his psychiatrist and has an appointment scheduled next Tuesday. Requests 30 day supply instead of 90. Does not want to switch medication, feels that he would have the same thoughts with a different med. Has the suicide hotline on his board in his room. Has good support with his mom who lives nearby.   PERTINENT  PMH / PSH: Reviewed   OBJECTIVE:   BP 125/87   Pulse 88   Wt (!) 307 lb 12.8 oz (139.6 kg)   SpO2 98%   BMI 41.75 kg/m    General: alert, pleasant, NAD CV: RRR no murmurs Resp: CTAB normal WOB  GI: soft, non distended, non tender Derm: warm, dry. No LE edema   ASSESSMENT/PLAN:   Suicidal ideations PHQ9 21 with positive SI. Patient has history of SI which typically presents as thoughts of wanting to overdose on his Metformin. Has not carried this out. Has been following closely with his psychiatrist and has appointment scheduled next week. Feels safe today. Has support in his roommate and mom who lives close by. Will reduce pill burden per patient's request. Safety plan in place.   Type 2 diabetes mellitus without complications (HCC) X5M 1 month ago 7.0. Fasting sugars typically around 90. Patient has been taking Lantus 27 units at bedtime and Metformin '1000mg'$  BID. Recently started on Ozempic and tolerating well. Continue Ozempic .'5mg'$  weekly. Will decrease Metformin to '1000mg'$  daily given CKD and less pill burden with his intrusive thoughts. Will check a1c in 2 months and may be able to decrease Insulin at that  time. Will check BMP today.     [place]

## 2021-12-19 NOTE — Patient Instructions (Signed)
It was great seeing you today!  You were seen for follow up and I am glad you are tolerating Ozempic well! I am decreasing your Metformin to '100mg'$  daily instead of twice daily given your kidney function. We will also check your kidney function today.   Please check-out at the front desk before leaving the clinic. I'd like to see you back in 2 months for next diabetes follow up, but if you need to be seen earlier than that for any new issues we're happy to fit you in, just give [place] a call!  Visit Reminders: - Stop by the pharmacy to pick up your prescriptions  - Continue to work on your healthy eating habits and incorporating exercise into your daily life.   Feel free to call with any questions or concerns at any time, at [phone].   Take care,  Dr. Shary Key [facility]

## 2021-12-20 LAB — BASIC METABOLIC PANEL
BUN/Creatinine Ratio: 10 (ref 9–20)
BUN: 17 mg/dL (ref 6–24)
CO2: 24 mmol/L (ref 20–29)
Calcium: 9.7 mg/dL (ref 8.7–10.2)
Chloride: 101 mmol/L (ref 96–106)
Creatinine, Ser: 1.74 mg/dL — ABNORMAL HIGH (ref 0.76–1.27)
Glucose: 141 mg/dL — ABNORMAL HIGH (ref 70–99)
Potassium: 4.6 mmol/L (ref 3.5–5.2)
Sodium: 141 mmol/L (ref 134–144)
eGFR: 49 mL/min/{1.73_m2} — ABNORMAL LOW (ref >59)

## 2021-12-21 NOTE — Assessment & Plan Note (Signed)
PHQ9 21 with positive SI. Patient has history of SI which typically presents as thoughts of wanting to overdose on his Metformin. Has not carried this out. Has been following closely with his psychiatrist and has appointment scheduled next week. Feels safe today. Has support in his roommate and mom who lives close by. Will reduce pill burden per patient's request. Safety plan in place.

## 2021-12-21 NOTE — Assessment & Plan Note (Signed)
A1c 1 month ago 7.0. Fasting sugars typically around 90. Patient has been taking Lantus 27 units at bedtime and Metformin '1000mg'$  BID. Recently started on Ozempic and tolerating well. Continue Ozempic .'5mg'$  weekly. Will decrease Metformin to '1000mg'$  daily given CKD and less pill burden with his intrusive thoughts. Will check a1c in 2 months and may be able to decrease Insulin at that time. Will check BMP today.

## 2021-12-30 ENCOUNTER — Other Ambulatory Visit: Payer: Self-pay | Admitting: Family Medicine

## 2022-01-02 ENCOUNTER — Telehealth: Payer: Self-pay

## 2022-01-02 NOTE — Telephone Encounter (Signed)
Received VM on nurse line from patient regarding low blood sugar readings. Returned call to patient.   Reports that blood sugar has been under 70 a few times this week. The lowest has been low to mid 60s.   Denies any symptoms when blood sugar drops. Also reports that blood sugar returns to normal after drinking juice.   Patient is asking if he can lower his Lantus. Advised patient that I would recommend him being seen to further discuss diabetes medication management. Offered appointment today with Dr. Valentina Lucks, patient states that he cannot come in today due to needing to stay at home to receive a package. Unsure if patient could be scheduled virtually?  Scheduled for next available with PCP on 7/31.   Please advise recommendations in the meantime.   ED precautions given.   Talbot Grumbling, RN

## 2022-01-02 NOTE — Telephone Encounter (Signed)
Called patient and informed of change. Patient verbalizes understanding.   Patient encouraged to still keep follow up with Dr. Arby Barrette for re assessment.   Return/ED precautions given.   Talbot Grumbling, RN

## 2022-01-13 ENCOUNTER — Ambulatory Visit: Payer: Medicare Other | Admitting: Family Medicine

## 2022-01-15 ENCOUNTER — Encounter: Payer: Self-pay | Admitting: Family Medicine

## 2022-01-15 ENCOUNTER — Ambulatory Visit (INDEPENDENT_AMBULATORY_CARE_PROVIDER_SITE_OTHER): Payer: Medicare Other | Admitting: Family Medicine

## 2022-01-15 VITALS — BP 110/74 | Ht 72.0 in | Wt 294.0 lb

## 2022-01-15 DIAGNOSIS — G8929 Other chronic pain: Secondary | ICD-10-CM | POA: Diagnosis not present

## 2022-01-15 DIAGNOSIS — M25512 Pain in left shoulder: Secondary | ICD-10-CM | POA: Diagnosis not present

## 2022-01-15 DIAGNOSIS — M25511 Pain in right shoulder: Secondary | ICD-10-CM

## 2022-01-15 NOTE — Progress Notes (Signed)
PCP: Shary Key, DO  Subjective:   HPI: Patient is a 46 y.o. male here for bilateral shoulder pain.  Patient reports he's slowly improving from his bilateral shoulder pain. Has been exercising, walking, doing his home exercises for both shoulders. Not needing any medications for this. No night pain.  Past Medical History:  Diagnosis Date   Depression    Diabetes mellitus without complication (HCC)    Gallstones    Obesity, Class III, BMI 40-49.9 (morbid obesity) ([place])     Current Outpatient Medications on File Prior to Visit  Medication Sig Dispense Refill   aspirin EC 81 MG tablet Take 81 mg by mouth daily.     atorvastatin (LIPITOR) 40 MG tablet Take 1 tablet (40 mg total) by mouth daily. 90 tablet 3   Blood Pressure Monitoring (COMFORT TOUCH BP CUFF/LARGE) MISC 1 each by Does not apply route once a week. 1 each 0   buPROPion (WELLBUTRIN XL) 300 MG 24 hr tablet Take 1 tablet (300 mg total) by mouth daily. 90 tablet 1   busPIRone (BUSPAR) 15 MG tablet Take 15 mg by mouth 3 (three) times daily.     cyclobenzaprine (FLEXERIL) 10 MG tablet Take 1 tablet (10 mg total) by mouth at bedtime. 20 tablet 0   diclofenac Sodium (VOLTAREN) 1 % GEL Apply 4 g topically as needed (Shoulder pain). 50 g 0   fluvoxaMINE (LUVOX) 50 MG tablet Take 3 tablets (150 mg total) by mouth at bedtime. 90 tablet 1   hydrOXYzine (ATARAX/VISTARIL) 50 MG tablet Take 1 tablet (50 mg total) by mouth every 6 (six) hours as needed for anxiety. 60 tablet 0   insulin glargine (LANTUS SOLOSTAR) 100 UNIT/ML Solostar Pen ADMINISTER 27 UNITS UNDER THE SKIN DAILY AT BEDTIME 15 mL 3   Insulin Pen Needle (BD PEN NEEDLE NANO 2ND GEN) 32G X 4 MM MISC Please use to inject insulin daily. 100 each 3   JARDIANCE 10 MG TABS tablet TAKE ONE TABLET BY MOUTH DAILY 30 tablet 0   metFORMIN (GLUCOPHAGE) 1000 MG tablet Take 1 tablet (1,000 mg total) by mouth daily with breakfast. 30 tablet 0   Multiple Vitamin (MULTIVITAMIN WITH  MINERALS) TABS tablet Take 1 tablet by mouth daily.     QUEtiapine (SEROQUEL) 200 MG tablet Take 1 tablet (200 mg total) by mouth at bedtime. 90 tablet 1   traZODone (DESYREL) 50 MG tablet Take 2 tabs equal 100  mg at bedtime 30 tablet 0   No current facility-administered medications on file prior to visit.    History reviewed. No pertinent surgical history.  Allergies  Allergen Reactions   Fish Allergy Nausea Only   Bee Venom Swelling   Keflex [Cephalexin]     UPSET STOMACH   Relafen [Nabumetone]     Blood in stool   Shellfish Allergy Nausea Only    BP 110/74   Ht 6' (1.829 m)   Wt 294 lb (133.4 kg)   BMI 39.87 kg/m       No data to display              No data to display              Objective:  Physical Exam:  Gen: NAD, comfortable in exam room  Right shoulder: No swelling, ecchymoses.  No gross deformity. No TTP. FROM. Mild positive Hawkins, Neers. Negative Yergasons. Strength 5/5 with empty can and resisted internal/external rotation. Negative apprehension. NV intact distally.  Left shoulder: No swelling,  ecchymoses.  No gross deformity. No TTP. FROM. Mild positive Hawkins, Neers. Negative Yergasons. Strength 5/5 with empty can and resisted internal/external rotation. Negative apprehension. NV intact distally.  Assessment & Plan:  1. Bilateral shoulder pain - 2/2 rotator cuff impingement.  Ultrasound of shoulders were reassuring.  He is improving.  Continue home exercises 3-4 times a week.  Voltaren gel, icing if needed.  F/u prn.  MRI of more symptomatic shoulder if he worsens.

## 2022-01-15 NOTE — Patient Instructions (Signed)
Do home exercises 3-4 times a week until pain resolves. Voltaren gel topically up to 4 times a day as needed. Icing 15 minutes at a time as needed. Follow up with [place] if you don't continue to improve. If that's the case, next step would be MRI of your more symptomatic shoulder.

## 2022-01-21 ENCOUNTER — Other Ambulatory Visit: Payer: Self-pay

## 2022-01-21 MED ORDER — ACCU-CHEK GUIDE VI STRP: ORAL_STRIP | 12 refills | Status: AC

## 2022-01-21 NOTE — Telephone Encounter (Signed)
Patient calls nurse line requesting refill on accu-chek testing strips. Patient reports checking blood sugar once daily.   Patient also has questions regarding Ozempic dosage. Patient has been on 0.5 mg Ozempic since 7/6 and is tolerating well. He is asking if he should increase to 1 mg.   Please advise.   Talbot Grumbling, RN

## 2022-01-21 NOTE — Telephone Encounter (Signed)
Patient can increase to 1 mg dose if he desires or wait until follow-up appointment with PCP on 8/18.

## 2022-01-31 ENCOUNTER — Other Ambulatory Visit: Payer: Self-pay

## 2022-01-31 ENCOUNTER — Encounter: Payer: Self-pay | Admitting: Family Medicine

## 2022-01-31 ENCOUNTER — Ambulatory Visit (INDEPENDENT_AMBULATORY_CARE_PROVIDER_SITE_OTHER): Payer: Medicare Other | Admitting: Family Medicine

## 2022-01-31 VITALS — BP 131/83 | HR 94 | Wt 302.8 lb

## 2022-01-31 DIAGNOSIS — E119 Type 2 diabetes mellitus without complications: Secondary | ICD-10-CM

## 2022-01-31 DIAGNOSIS — Z794 Long term (current) use of insulin: Secondary | ICD-10-CM | POA: Diagnosis not present

## 2022-01-31 DIAGNOSIS — F332 Major depressive disorder, recurrent severe without psychotic features: Secondary | ICD-10-CM

## 2022-01-31 MED ORDER — OZEMPIC (1 MG/DOSE) 4 MG/3ML ~~LOC~~ SOPN: 1.0000 mg | PEN_INJECTOR | SUBCUTANEOUS | 0 refills | Status: DC

## 2022-01-31 NOTE — Patient Instructions (Signed)
It was great seeing you today!  Increase your Ozempic to 1 mg weekly.  Continue to watch your diet, eating healthy well-balanced meals.  If you come across the place you had your eye exam done, please have them fax records to [phone]  I will see you back in about 3 to 4 months for your next diabetes follow-up, but if you need to be seen earlier than that for any new issues we're happy to fit you in, just give [place] a call!  Visit Reminders: - Stop by the pharmacy to pick up your prescriptions  - Continue to work on your healthy eating habits and incorporating exercise into your daily life.    Feel free to call with any questions or concerns at any time, at [phone].   Take care,  Dr. Shary Key [facility]

## 2022-01-31 NOTE — Progress Notes (Signed)
    SUBJECTIVE:   CHIEF COMPLAINT / HPI:   Patient presents with his mom for follow up/increase in Ozempic dose. Last A1c 7.0 2 months ago. Currently taking Ozempic is on .'5mg'$  . Taking Insulin 15 units at night, was reduced at last visit. Metformin '1000mg'$  daily. Fasting blood sugars have been around 90s. Has been eating 3 meals a day, thinking about reducing it to 2 meals. Starts eating at 11:30 and stops at about 8pm.   Was seen by nephrologist yesterday. No changes and was told to return in 6 months   Mood has been stable and at his baseline, has chronic intrusive thoughts of not wanting to be here. Has thoughts of overdosing on his medication. Has not followed through with this. No increased intrusive thoughts currently. Does not feel that it has worsened since starting Ozempic   Has had eye exam in March, not sure where she went   PERTINENT  PMH / PSH: Reviewed   OBJECTIVE:   BP 131/83   Pulse 94   Wt (!) 302 lb 12.8 oz (137.3 kg)   SpO2 96%   BMI 41.07 kg/m    Physical exam General: well appearing, NAD Cardiovascular: RRR, no murmurs Lungs: CTAB. Normal WOB Abdomen: soft, non-distended, non-tender Skin: warm, dry. No edema Psych: mood and affect appropriate. Speech non pressured.   ASSESSMENT/PLAN:   Type 2 diabetes mellitus without complications (HCC) Last A1c 7.0 in June. Fasting blood sugars in the 90s after reducing insulin at last visit. Currently taking Lantus 15 units at night and Ozempic .'5mg'$  weekly which he is tolerating well. Will increase to '1mg'$  weekly. Discussed lifestyle changes with eating balanced meals, reducing carb and sugar intake. Will follow up in 3-4 months for next A1c check unless any new symptoms/hypoglycemia.   Severe recurrent major depression without psychotic features ([place]) PHQ9 21 with positive SI which is his baseline. Mood has been stable and at his baseline, has chronic intrusive thoughts of not wanting to be here. Has thoughts of  overdosing on his medication. Has not followed through with this. No increased intrusive thoughts currently. Follows closely with his psychiatrist and has safety plan in place and good support.    Health maintenance Last eye exam in March of this year.  Advised patient next time he is at his eye doctors to have results sent to [place].  [place]

## 2022-01-31 NOTE — Assessment & Plan Note (Signed)
Last A1c 7.0 in June. Fasting blood sugars in the 90s after reducing insulin at last visit. Currently taking Lantus 15 units at night and Ozempic .'5mg'$  weekly which he is tolerating well. Will increase to '1mg'$  weekly. Discussed lifestyle changes with eating balanced meals, reducing carb and sugar intake. Will follow up in 3-4 months for next A1c check unless any new symptoms/hypoglycemia.

## 2022-01-31 NOTE — Assessment & Plan Note (Signed)
PHQ9 21 with positive SI which is his baseline. Mood has been stable and at his baseline, has chronic intrusive thoughts of not wanting to be here. Has thoughts of overdosing on his medication. Has not followed through with this. No increased intrusive thoughts currently. Follows closely with his psychiatrist and has safety plan in place and good support.

## 2022-02-18 ENCOUNTER — Telehealth: Payer: Self-pay

## 2022-02-18 NOTE — Telephone Encounter (Signed)
Patient calls nurse line requesting refill on lisinopril. Patient reports that he has been taking 2.5 mg lisinopril once daily. Patient looked at bottle and reports that kidney specialist has been prescribing and that he will contact that office for refill.   He will call back if he has any issues with obtaining refill.   Talbot Grumbling, RN

## 2022-02-20 ENCOUNTER — Telehealth: Payer: Self-pay

## 2022-02-20 MED ORDER — OZEMPIC (1 MG/DOSE) 4 MG/3ML ~~LOC~~ SOPN
1.0000 mg | PEN_INJECTOR | SUBCUTANEOUS | 0 refills | Status: DC
Start: 2022-02-20 — End: 2022-03-25

## 2022-02-20 NOTE — Telephone Encounter (Signed)
Patient calls nurse line requesting a refill on Ozempic '1mg'$ .   Patient advised this was sent in to [facility] on 8/18.  Patient reports he does not use this pharmacy anymore and has not picked up the medication.   Patient has not missed any doses.   Prescription cancelled at [place] and resent to [employer] and requested.

## 2022-02-25 ENCOUNTER — Other Ambulatory Visit: Payer: Self-pay

## 2022-02-25 MED ORDER — EMPAGLIFLOZIN 10 MG PO TABS: 10.0000 mg | ORAL_TABLET | Freq: Every day | ORAL | 0 refills | Status: DC

## 2022-03-10 ENCOUNTER — Other Ambulatory Visit: Payer: Self-pay | Admitting: Family Medicine

## 2022-03-15 ENCOUNTER — Other Ambulatory Visit: Payer: Self-pay | Admitting: Family Medicine

## 2022-03-24 ENCOUNTER — Other Ambulatory Visit: Payer: Self-pay | Admitting: Family Medicine

## 2022-03-25 ENCOUNTER — Other Ambulatory Visit: Payer: Self-pay | Admitting: Family Medicine

## 2022-03-28 ENCOUNTER — Other Ambulatory Visit: Payer: Self-pay | Admitting: Family Medicine

## 2022-04-01 ENCOUNTER — Telehealth: Payer: Self-pay

## 2022-04-01 NOTE — Telephone Encounter (Signed)
Patient calls nurse line regarding low BP and "feeling off balance." No other symptoms. He states that BP has been running in the mid 90's- low 183'U systolic, diastolic in the 37'D. Lowest measured BP was 90/71. This has been going on for the last two weeks.   He does not take any BP medications. He does report that he recently had his dosage of Wellbutrin and Seroquel doubled.   Patient is unable to come into the office until Thursday. Scheduled with Dr. Gwendolyn Lima for Thursday afternoon. I also recommended that patient contact psychiatrist to discuss changes.   Strict ED precautions given.   Forwarding as FYI to PCP and Dr. Gwendolyn Lima.   Talbot Grumbling, RN

## 2022-04-03 ENCOUNTER — Encounter: Payer: Self-pay | Admitting: Family Medicine

## 2022-04-03 ENCOUNTER — Ambulatory Visit (HOSPITAL_COMMUNITY)
Admission: EM | Admit: 2022-04-03 | Discharge: 2022-04-03 | Disposition: A | Discharge status: Home / Self Care | Payer: Medicare Other | Attending: Psychiatry | Admitting: Psychiatry

## 2022-04-03 ENCOUNTER — Ambulatory Visit (INDEPENDENT_AMBULATORY_CARE_PROVIDER_SITE_OTHER): Payer: Medicare Other | Admitting: Family Medicine

## 2022-04-03 VITALS — BP 109/81 | HR 80 | Ht 72.0 in | Wt 301.8 lb

## 2022-04-03 DIAGNOSIS — Z794 Long term (current) use of insulin: Secondary | ICD-10-CM | POA: Diagnosis not present

## 2022-04-03 DIAGNOSIS — R45851 Suicidal ideations: Secondary | ICD-10-CM | POA: Diagnosis present

## 2022-04-03 DIAGNOSIS — F332 Major depressive disorder, recurrent severe without psychotic features: Secondary | ICD-10-CM | POA: Diagnosis not present

## 2022-04-03 DIAGNOSIS — Z23 Encounter for immunization: Secondary | ICD-10-CM

## 2022-04-03 DIAGNOSIS — E119 Type 2 diabetes mellitus without complications: Secondary | ICD-10-CM | POA: Diagnosis not present

## 2022-04-03 DIAGNOSIS — F319 Bipolar disorder, unspecified: Secondary | ICD-10-CM | POA: Insufficient documentation

## 2022-04-03 DIAGNOSIS — R42 Dizziness and giddiness: Secondary | ICD-10-CM | POA: Diagnosis not present

## 2022-04-03 DIAGNOSIS — Z9151 Personal history of suicidal behavior: Secondary | ICD-10-CM | POA: Insufficient documentation

## 2022-04-03 DIAGNOSIS — F314 Bipolar disorder, current episode depressed, severe, without psychotic features: Secondary | ICD-10-CM

## 2022-04-03 LAB — POCT GLYCOSYLATED HEMOGLOBIN (HGB A1C): HbA1c, POC (controlled diabetic range): 6 % (ref 0.0–7.0)

## 2022-04-03 NOTE — Progress Notes (Signed)
[place] contacted [facility] to inquiry about the provider meeting with the client tomorrow.  The receptionist reported his ongoing provider is out of the office for the week but he will send the information so someone can contact him tomorrow after their 10am meeting.   Mikey Kirschner [facility]

## 2022-04-03 NOTE — Assessment & Plan Note (Addendum)
History of intrusive thoughts of wanting to kill himself however there is now an active increase especially today despite increase in medication.  Increase in medication is also caused concomitant side effects such as low blood pressure and dizziness. Says before medications were increased had SI 50 times a day, now usually 5 times a day, but today is a really bad day.  -  Consulting and sending to crisis center [facility] for evaluation and adjustment of medications like Seroquel and Wellbutrin.  - Possible initiation of other medication at [place] as patient has been on current regimen for a long time. -Follow-up in clinic in 1 week

## 2022-04-03 NOTE — Discharge Instructions (Signed)

## 2022-04-03 NOTE — BH Assessment (Signed)
Comprehensive Clinical Assessment (CCA) Screening, Triage and Referral Note  04/03/2022 Marc Long [id number]  Screening/Triage completed. Patient is Routine. MSE completed by the [facility] Rosary Lively, NP. Patient is psych cleared to discharge home and recommended to follow up with current provider.  Chief Complaint: Suicidal ideations and Intrusive suicidal thoughts  Visit Diagnosis: Schizophrenia, Bipolar Disorder predominately depressive symptoms,   Patient Reported Information How did you hear about [place]? Primary Care  What Is the Reason for Your Visit/Call Today?  Marc Long is a 46 y/o male w/ hx of Schizophrenia, Bipolar Disorder predominately depressive symptoms, chronic obsessive/suicidal thoughts. He is voluntary and was transported to the [facility] voluntarily from [place] where he see's a PCP Marc Ram, MD). According to patient he initially went to see his PCP today for medical concerns, "low blood pressure". During his office visit he was asked to complete a PHQ-9. Per chart review and documentation from PCP: "Patient had 3 on question 9 of PHQ-9.  He mentions that he has history of passive intrusive thoughts of killing himself.  He says he does not want to kill himself but he has these almost visions every day.  However today they have increased and he says that he is having these intrusive thoughts even as he is talking to me." During the screening/triage patient reports chronic suicidal thoughts up to 50 times per day for the past several years. However, with medication management in place by his current ACTT provider ([facility] and their psychiatrist-Marc Long) he typically has no more than 5 suicidal thoughts per day. Today, he has experienced more than 5 suicidal thoughts and continues to have suicidal thoughts. When asked if he has a suicide plan today or typically has a plan he states, "It's just my same oh default plan to stop taking my  diabetic medications and let it do it's thing". He has no triggers for his related history of suicidal thoughts. Hx of self harm (punching self in the face). No history of HI, AVH's, and/or alcohol/drug use. Hx of 2 hospitalizations [facility] and [facility]). Lives with roommate. Accompanied by mother and has a large support sytem.  How Long Has This Been Causing You Problems? > than 6 months  What Do You Feel Would Help You the Most Today? Medication(s); Treatment for Depression or other mood problem   Have You Recently Had Any Thoughts About Hurting Yourself? No  Are You Planning to Commit Suicide/Harm Yourself At This time? Yes   Have you Recently Had Thoughts About Hurting Someone Marc Long? No  Are You Planning to Harm Someone at This Time? No  Explanation: No data recorded  Have You Used Any Alcohol or Drugs in the Past 24 Hours? No  How Long Ago Did You Use Drugs or Alcohol? No  Do You Currently Have a Therapist/Psychiatrist? [facility] (ACTT provider) Name of Therapist/Psychiatrist: [facility] and Marc Long  Have You Been Recently Discharged From Any Office Practice or Programs? No  Explanation of Discharge From Practice/Program: No    County of Residence: [place]  Patient Currently Receiving the Following Services: No data recorded  Determination of Need: Routine (7 days)   Options For Referral: Other: Comment; Medication Management; Outpatient Therapy (Continous Observation and/or Follow up with ACTT services for chronic mental health symptoms.)   Discharge Disposition: Follow up with current provider ([facility] ACTT services)    Waldon Merl, Counselor

## 2022-04-03 NOTE — Assessment & Plan Note (Signed)
New dizziness since increasing Seroquel and Wellbutrin.  With associated hypotension to 90s/70s.  -Hold lisinopril 2.5 mg as can contribute to hypotension and patient has well-controlled diabetes currently and is not hypertensive. -Sending to [facility] to evaluate for acute SI, where patient can also be evaluated for changing medication regimen that could be contributing to low blood pressure. -Return precautions given

## 2022-04-03 NOTE — Progress Notes (Signed)
SUBJECTIVE:   CHIEF COMPLAINT / HPI:   Suicidal Ideation (Bipolar 1 with predominantly depressive symptoms)  Patient had 3 on question 9 of PHQ-9.  He mentions that he has history of passive intrusive thoughts of killing himself.  He says he does not want to kill himself but he has these almost visions every day.  However today they have increased and he says that he is having these intrusive thoughts even as he is talking to me.  He would overdose on metformin he says.  He denies any triggers.  He says that he has had these thoughts despite an increase in Seroquel and Wellbutrin 2 weeks ago.  He says that he does have protective factors.  He says in response to how likely would you be to act on these thoughts he says not likely.  He says that he has a lot of people who love him in his life and he would not like to make them upset by leaving them.  Says his support group is mom, roommate, brothers.  He says when he has these thoughts he either goes to sleep or lets it right out.  He says that he has tried many coping mechanisms including playing video games ice in his hands going outside etc. however he still has these thoughts and thinks about it more.  He says unfortunately self-harm is the only thing that really helps.  Last time he has done self-harm was a month ago where he kept punching his hand until it was bleeding and he could focus on the pain rather than his intrusive thoughts.  Dizziness/low BP Patient says that for the past week and a half he has been having low blood pressures.  He says that when measuring blood pressures at home they have been 90s over 70s.  2 weeks ago he had an increase in his Seroquel and Wellbutrin per psychiatry Seroquel was only at 7 PM now increased to 1 pill at 12 PM and 1 pill at 7 PM.  He has these low blood pressures and dizziness throughout the entire day.  He denies any falls or passing out but he has had near falls.  He denies any hypoglycemic episodes.  Denies urinary retention   T2DM  Patient says that his blood sugars have stayed between 70-100.  He has only had 1 blood sugar above 100 at 110.  He has been checking his blood sugars frequently.  He wanted to increase his ozempic the goal of getting up to 2.0 mg. He has been taking his lisinopril 2.5 mg for kidney protection.   PERTINENT  PMH / PSH: T2DM, CKD3, Bipolar Disorder, Schizophrenia   OBJECTIVE:   BP 109/81   Pulse 80   Ht 6' (1.829 m)   Wt (!) 136.9 kg   SpO2 97%   BMI 40.93 kg/m   General: well appearing HEEN: slightly dry mucous membranes, not pale  CV: RRR, pulse palpable  Resp: Normal work of breathing  Abd: non tender, non distended, soft  Psych: reacts appropriately when discussing better A1c was animated, when discussing intrusive thoughts was more flat and low affect, started negotiating after deciding to send to [facility]  ASSESSMENT/PLAN:   Type 2 diabetes mellitus without complication, with long-term current use of insulin ([place]) Assessment & Plan: A1c today is 6.0.  Patient compliance with insulin, Jardiance, metformin, Ozempic, lisinopril. - Increase Ozempic to 1.25 -Decrease insulin to 20 units Lantus daily from 27 units to prevent hypoglycemia as daily  sugars have already decreased to range of 70-1 100s and increasing the Ozempic.  Orders: -     POCT glycosylated hemoglobin (Hb A1C)  Severe bipolar I disorder, most recent episode depressed ([place]) Assessment & Plan: PHQ 9, question 9 selected nearly every day. Pt with hx of intrusive thoughts of not wanting to be here and wanting to overdose on medication.     Need for immunization against influenza -     Flu Vaccine QUAD 53moIM (Fluarix, Fluzone & Alfiuria Quad PF)  Suicidal ideations Assessment & Plan: History of intrusive thoughts of wanting to kill himself however there is now an active increase especially today despite increase in medication.  Increase in medication is also caused concomitant  side effects such as low blood pressure and dizziness. Says before medications were increased had SI 50 times a day, now usually 5 times a day, but today is a really bad day.  -  Consulting and [facility] for evaluation and adjustment of medications like Seroquel and Wellbutrin.  - Possible initiation of other medication at [facility] patient has been on current regimen for a long time. -Follow-up in clinic in 1 week    Dizziness Assessment & Plan: New dizziness since increasing Seroquel and Wellbutrin.  With associated hypotension to 90s/70s.  -Hold lisinopril 2.5 mg as can contribute to hypotension and patient has well-controlled diabetes currently and is not hypertensive. -[facility] SI, where patient can also be evaluated for changing medication regimen that could be contributing to low blood pressure. -Return precautions given  Orders: -     CBC -     Ferritin    ALowry Ram MD [facility]

## 2022-04-03 NOTE — Assessment & Plan Note (Signed)
PHQ 9, question 9 selected nearly every day. Pt with hx of intrusive thoughts of not wanting to be here and wanting to overdose on medication.

## 2022-04-03 NOTE — Assessment & Plan Note (Signed)
A1c today is 6.0.  Patient compliance with insulin, Jardiance, metformin, Ozempic, lisinopril. - Increase Ozempic to 1.25 -Decrease insulin to 20 units Lantus daily from 27 units to prevent hypoglycemia as daily sugars have already decreased to range of 70-1 100s and increasing the Ozempic.

## 2022-04-03 NOTE — ED Notes (Signed)
Patient discharged by provider. Printed AVS given and reviewed with patient per provider. No s/s of current distress.

## 2022-04-03 NOTE — ED Provider Notes (Signed)
Behavioral Health Urgent Care Medical Screening Exam  Patient Name: Marc Long MRN: [id number] Date of Evaluation: 04/03/22 Chief Complaint:   Diagnosis:  Final diagnoses:  Suicidal ideations  Severe recurrent major depression without psychotic features ([place])    History of Present illness: Marc Long is a 46 y.o. male.  Patient presents voluntarily to [facility] behavioral health for walk-in assessment.  Patient prefers that his mother, Alen Blew, remain present during assessment.  Marc Long is seated in assessment area, no apparent distress.  He is alert and oriented, pleasant and cooperative during assessment.  Euthymic mood, congruent affect.  Patient encouraged to seek assessment by primary care provider.  Earlier this date patient met with primary care provider, PHQ-9 questionnaire administered, he endorsed suicidal ideation at that time.  Patient denies suicidal ideation currently.  No plan or intent to harm self. He states "I am not actually suicidal but I do have chronic intrusive suicidal thoughts."  Patient reports suicidal thoughts typically 50 times per day or more, recently 5 times per day after medications updated.  Marc Long is followed by outpatient psychiatry through [facility] team.  He meets with ACT team at least once per week.  He is compliant with medications including BuSpar, Wellbutrin, Seroquel and clonazepam.  He is unable to recall specific doses of BuSpar and clonazepam.  2 weeks ago his Wellbutrin dose was doubled to 300 mg total daily and his Seroquel dose was doubled to 400 mg total daily.  Patient and his primary care provider feel this may have contributed to patient's recent episodes of dizziness and low blood pressure.  Current plans include patient to reach out to Dr Franchot Mimes with [facility] act team to review medication doses.  Denies suicidal and homicidal ideations at this time.  He easily contracts verbally for safety with  this Probation officer.  He endorses history of major depressive disorder, bipolar disorder and suicidal ideation.  He endorses history of 2 previous suicide attempts by intentional overdose.  He endorses history of nonsuicidal self-harm by punching himself.  Most recent episode of punching many years ago.  Most recent overdose in 2019.  He endorses history of multiple previous inpatient psychiatric hospitalizations.    He denies auditory and visual hallucinations.  There is no evidence of delusional thought content and no indication that patient is responding to internal stimuli.  He denies symptoms of paranoia.  Marc Long resides in [place] with a roommate, he denies access to weapons.  He receives disability income.  He enjoys playing video games and working on projects around the house in his spare time.  He endorses average sleep and appetite.  He denies alcohol or substance use.  Patient offered support and encouragement.  He verbalizes plan to speak with prescribing psychiatrist regarding concerns surrounding medication dose increase.  Patient's mother, Alen Blew, denies safety concerns.  She agrees with plan for follow-up with established ACTT team.  Patient and family are educated and verbalize understanding of mental health resources and other crisis services in the community. They are instructed to call 911 and present to the nearest emergency room should patient experience any suicidal/homicidal ideation, auditory/visual/hallucinations, or detrimental worsening of mental health condition.      Psychiatric Specialty Exam  Presentation  General Appearance:Appropriate for Environment; Casual  Eye Contact:Good  Speech:Clear and Coherent; Normal Rate  Speech Volume:Normal  Handedness:Right   Mood and Affect  Mood: Euthymic  Affect: Appropriate; Congruent   Thought Process  Thought Processes: Coherent; Goal  Directed; Linear  Descriptions of Associations:Intact  Orientation:Full  (Time, Place and Person)  Thought Content:Logical; WDL    Hallucinations:None  Ideas of Reference:None  Suicidal Thoughts:No  Homicidal Thoughts:No   Sensorium  Memory: Immediate Good; Recent Good  Judgment: Fair  Insight: Fair   Executive Functions  Concentration: Good  Attention Span: Good  Recall: Good  Fund of Knowledge: Good  Language: Good   Psychomotor Activity  Psychomotor Activity: Normal   Assets  Assets: Communication Skills; Desire for Improvement; Financial Resources/Insurance; Housing; Intimacy; Leisure Time; Physical Health; Resilience; Social Support   Sleep  Sleep: Good  Number of hours:  10   No data recorded  Physical Exam: Physical Exam Vitals and nursing note reviewed.  Constitutional:      Appearance: Normal appearance. He is well-developed.  HENT:     Head: Normocephalic and atraumatic.     Nose: Nose normal.  Cardiovascular:     Rate and Rhythm: Normal rate.  Pulmonary:     Effort: Pulmonary effort is normal.  Musculoskeletal:        General: Normal range of motion.     Cervical back: Normal range of motion.  Skin:    General: Skin is warm and dry.  Neurological:     Mental Status: He is alert and oriented to person, place, and time.  Psychiatric:        Attention and Perception: Attention and perception normal.        Mood and Affect: Mood and affect normal.        Speech: Speech normal.        Behavior: Behavior normal. Behavior is cooperative.        Thought Content: Thought content normal.        Cognition and Memory: Cognition and memory normal.        Judgment: Judgment normal.    Review of Systems  Constitutional: Negative.   HENT: Negative.    Eyes: Negative.   Respiratory: Negative.    Cardiovascular: Negative.   Gastrointestinal: Negative.   Genitourinary: Negative.   Musculoskeletal: Negative.   Skin: Negative.   Neurological: Negative.   Psychiatric/Behavioral: Negative.     Blood  pressure 110/85, pulse 78, temperature 98.1 F (36.7 C), temperature source Oral, resp. rate 18, SpO2 96 %. There is no height or weight on file to calculate BMI.  Musculoskeletal: Strength & Muscle Tone: within normal limits Gait & Station: normal Patient leans: N/A   [facility] MSE Discharge Disposition for Follow up and Recommendations: Based on my evaluation the patient does not appear to have an emergency medical condition and can be discharged with resources and follow up care in outpatient services for Medication Management and Individual Therapy Follow-up with [facility].  [facility] staff member will reach out to you on tomorrow. Continue current medications.   Lucky Rathke, FNP 04/03/2022, 5:23 PM

## 2022-04-04 ENCOUNTER — Telehealth: Payer: Self-pay

## 2022-04-04 LAB — CBC
Hematocrit: 41.6 % (ref 37.5–51.0)
Hemoglobin: 14.3 g/dL (ref 13.0–17.7)
MCH: 30.7 pg (ref 26.6–33.0)
MCHC: 34.4 g/dL (ref 31.5–35.7)
MCV: 89 fL (ref 79–97)
Platelets: 262 10*3/uL (ref 150–450)
RBC: 4.66 x10E6/uL (ref 4.14–5.80)
RDW: 13.1 % (ref 11.6–15.4)
WBC: 5.6 10*3/uL (ref 3.4–10.8)

## 2022-04-04 LAB — FERRITIN: Ferritin: 100 ng/mL (ref 30–400)

## 2022-04-04 NOTE — Telephone Encounter (Signed)
Patient calls nurse line wanting to update medication to chart.   He is taking Rexulti 2 mg once daily prescribed by psychiatrist.   I have updated this to patient's chart.   FYI to PCP.   Talbot Grumbling, RN

## 2022-04-09 NOTE — Progress Notes (Signed)
    SUBJECTIVE:   CHIEF COMPLAINT / HPI: Follow-up SI  Patient states he is here for follow-up after suicidal ideation at last visit.  Patient is here with mother who is his support person.  Patient states he has daily recurrent intrusive thoughts suicidal ideation.  Day-to-day varies as to how many times he has these thoughts.  In comparison to last week when he was having a "bad day "he has only had 2 episodes of intrusive thoughts about suicide today in comparison to 30-50 last week.  Patient has plan to overdose on metformin.  States that he does not actively have wished to not live at this time.  States his mood status improved, since last week.  Patient saw psychiatrist yesterday and had further discussion of this topic, several medication changes made.  Currently sees therapist once a week and believes this is helping.  Denies auditory or visual hallucinations.  PERTINENT  PMH / PSH: Severe bipolar 1 disorder, T2DM  OBJECTIVE:   BP 107/76   Pulse 97   Ht 6' (1.829 m)   Wt 298 lb (135.2 kg)   SpO2 97%   BMI 40.42 kg/m   General: NAD  Cardiovascular: RRR, no murmurs, no peripheral edema Respiratory: normal WOB on RA, CTAB, no wheezes, ronchi or rales Extremities: Moving all 4 extremities equally   ASSESSMENT/PLAN:   Suicidal ideations Patient has history of daily intrusive thoughts of suicide but not have active intent at this time. Discussed with patient and mom continuing to see therapist once per week and continued to manage psychiatric medications per psychiatrist at [facility].  Medications have been updated to reflect changes psychiatrist made this week. Current regimen: -Wellbutrin 300 mg daily -Luvox 150 mg at bedtime -Invega 6 mg twice daily -Rexulti 2 mg daily  Type 2 diabetes mellitus with complication, with long-term current use of insulin (HCC) Previously discussed to titrate Ozempic up to 2 mg 1 week ago.  On discussion with preceptor Dr. Ardelia Mems, given  recent changes psychiatric medication will wait to titrate up until next visit with patient's primary care provider.      Salvadore Oxford, MD [place]

## 2022-04-10 ENCOUNTER — Ambulatory Visit (INDEPENDENT_AMBULATORY_CARE_PROVIDER_SITE_OTHER): Payer: Medicare Other | Admitting: Family Medicine

## 2022-04-10 ENCOUNTER — Encounter: Payer: Self-pay | Admitting: Family Medicine

## 2022-04-10 DIAGNOSIS — R45851 Suicidal ideations: Secondary | ICD-10-CM | POA: Diagnosis not present

## 2022-04-10 DIAGNOSIS — Z794 Long term (current) use of insulin: Secondary | ICD-10-CM

## 2022-04-10 DIAGNOSIS — E118 Type 2 diabetes mellitus with unspecified complications: Secondary | ICD-10-CM

## 2022-04-10 NOTE — Progress Notes (Signed)
Your hemoglobin and ferritin are normal.

## 2022-04-10 NOTE — Assessment & Plan Note (Addendum)
Patient has history of daily intrusive thoughts of suicide but not have active intent at this time. Discussed with patient and mom continuing to see therapist once per week and continued to manage psychiatric medications per psychiatrist at [facility].  Medications have been updated to reflect changes psychiatrist made this week. Current regimen: -Wellbutrin 300 mg daily -Luvox 150 mg at bedtime -Invega 6 mg twice daily -Rexulti 2 mg daily

## 2022-04-10 NOTE — Assessment & Plan Note (Addendum)
Previously discussed to titrate Ozempic up to 2 mg 1 week ago.  On discussion with preceptor Dr. Ardelia Mems, given recent changes psychiatric medication will wait to titrate up until next visit with patient's primary care provider.

## 2022-04-10 NOTE — Patient Instructions (Signed)
It was great to see you! Thank you for allowing me to participate in your care!  I recommend that you always bring your medications to each appointment as this makes it easy to ensure we are on the correct medications and helps [place] not miss when refills are needed.  Our plans for today:  -I believe we are on the appropriate track for helping improve your psychiatric illness.  Please continue taking her psychiatric medications as recommended by your psychiatrist.  Please continue to see her therapist once weekly.  Please do not hesitate to go to behavioral health urgent care if you feel that you need to. -Please follow-up in 1 month with your primary care provider to discuss titration of Ozempic up to 2 mg and further monitoring of your diabetes.    Take care and seek immediate care sooner if you develop any concerns.   Dr. Salvadore Oxford, MD [place]

## 2022-04-17 ENCOUNTER — Other Ambulatory Visit: Payer: Self-pay | Admitting: Family Medicine

## 2022-04-18 ENCOUNTER — Telehealth: Payer: Self-pay

## 2022-04-18 NOTE — Telephone Encounter (Signed)
Patient calls nurse line requesting insulin pen needles for lantus.   Will forward to PCP.

## 2022-04-19 ENCOUNTER — Other Ambulatory Visit: Payer: Self-pay | Admitting: Family Medicine

## 2022-04-19 MED ORDER — BD PEN NEEDLE NANO 2ND GEN 32G X 4 MM MISC: 3 refills | Status: DC

## 2022-04-23 ENCOUNTER — Other Ambulatory Visit: Payer: Self-pay | Admitting: Family Medicine

## 2022-05-03 ENCOUNTER — Ambulatory Visit
Admission: EM | Admit: 2022-05-03 | Discharge: 2022-05-03 | Disposition: A | Discharge status: Home / Self Care | Payer: Medicare Other | Attending: Emergency Medicine | Admitting: Emergency Medicine

## 2022-05-03 ENCOUNTER — Ambulatory Visit (INDEPENDENT_AMBULATORY_CARE_PROVIDER_SITE_OTHER): Payer: Medicare Other

## 2022-05-03 DIAGNOSIS — K5904 Chronic idiopathic constipation: Secondary | ICD-10-CM | POA: Diagnosis not present

## 2022-05-03 DIAGNOSIS — R1032 Left lower quadrant pain: Secondary | ICD-10-CM | POA: Diagnosis not present

## 2022-05-03 MED ORDER — TRULANCE 3 MG PO TABS: 1.0000 | ORAL_TABLET | Freq: Every day | ORAL | 0 refills | Status: DC

## 2022-05-03 NOTE — Discharge Instructions (Signed)
Your x-ray is concerning for excessive, retained stool.  I recommend that you begin a medication called Trulance that you take once daily before your breakfast meal to help move your bowels.  You are welcome to continue taking Metamucil as you have been.  Please be sure that you are drinking between 80 and 120 ounces of water every day.  Please follow-up with your primary care provider to discuss these findings and how you are doing on treatments.  Thank you for visiting urgent care today.

## 2022-05-03 NOTE — ED Triage Notes (Addendum)
Pt c/o LLQ abd pain (dull) that started yesterday. The patient states he is still able to eat and drink.  Home interventions: none  Patients mother is at bedside.

## 2022-05-03 NOTE — ED Provider Notes (Signed)
[facility]    CSN: [id number] Arrival date & time: 05/03/22  4098    HISTORY   Chief Complaint  Patient presents with   Abdominal Pain   HPI Marc Long is a pleasant, 46 y.o. male who presents to urgent care today. Patient is here with his mother today.  Patient complains of left lower quadrant pain that he describes as dull.  Patient states it began yesterday.  Patient denies loss of appetite.  Patient denies nausea, vomiting, diarrhea.  Patient is a well-controlled type II diabetic.  Patient has normal vital signs on arrival today.  Mother states she gave him a suppository yesterday which helped him move his bowels, patient states he strained so hard he almost fainted.  Patient states he has been taking 20 Metamucil capsules daily to be sure he is getting enough fiber but adds that he does not drink as much water as he should.  Patient takes clonazepam as part of his treatment for schizophrenia and severe bipolar 1 disorder.  The history is provided by the patient.   Past Medical History:  Diagnosis Date   Depression    Diabetes mellitus due to underlying condition with unspecified complications ([place]) 06/16/9145   Diabetes mellitus without complication ([place])    DM (diabetes mellitus), type 2, uncontrolled 12/12/2016   Gallstones    H/O: HTN (hypertension) 07/18/2021   Hyperglycemia due to type 2 diabetes mellitus ([place]) 12/12/2016   Obesity, Class III, BMI 40-49.9 (morbid obesity) ([place])    Patient Active Problem List   Diagnosis Date Noted   Chronic pain of both shoulders 11/27/2021   Stage 3b chronic kidney disease ([place]) 02/21/2021   Dizziness 02/21/2021   Schizophrenia ([place]) 04/06/2018   Severe recurrent major depression without psychotic features ([place]) 12/16/2017   Obesity (BMI 30-39.9) 12/09/2017   Severe bipolar I disorder, most recent episode depressed ([place])    Hypertriglyceridemia 02/05/2017   Suicidal ideations 12/12/2016   Obesity, Class III, BMI  40-49.9 (morbid obesity) ([place])    Type 2 diabetes mellitus with complication, with long-term current use of insulin ([place]) 12/10/2016   Moderate single current episode of major depressive disorder ([place]) 04/15/2016   Shoulder pain 04/14/2016   Diverticulosis of large intestine without hemorrhage 11/07/2015   Calculus of gallbladder without cholecystitis 11/07/2015   History reviewed. No pertinent surgical history.  Home Medications    Prior to Admission medications   Medication Sig Start Date End Date Taking? Authorizing Provider  aspirin EC 81 MG tablet Take 81 mg by mouth daily. Patient not taking: Reported on 04/03/2022    [provider]  atorvastatin (LIPITOR) 40 MG tablet TAKE ONE TABLET BY MOUTH DAILY 03/24/22   Shary Key, DO  Blood Pressure Monitoring (COMFORT TOUCH BP CUFF/LARGE) MISC 1 each by Does not apply route once a week. 04/18/21   Shary Key, DO  buPROPion (WELLBUTRIN XL) 300 MG 24 hr tablet Take 1 tablet (300 mg total) by mouth daily. 01/14/19   Johnn Hai, MD  cyclobenzaprine (FLEXERIL) 10 MG tablet Take 1 tablet (10 mg total) by mouth at bedtime. Patient not taking: Reported on 04/03/2022 04/01/21   Shary Key, DO  diclofenac Sodium (VOLTAREN) 1 % GEL Apply 4 g topically as needed (Shoulder pain). Patient not taking: Reported on 04/03/2022 10/17/21   Sharion Settler, DO  fluvoxaMINE (LUVOX) 50 MG tablet Take 3 tablets (150 mg total) by mouth at bedtime. 01/14/19   Johnn Hai, MD  glucose blood (ACCU-CHEK GUIDE)  test strip Please use to check blood sugar once daily. 01/21/22   [name], MD  hydrOXYzine (ATARAX/VISTARIL) 50 MG tablet Take 1 tablet (50 mg total) by mouth every 6 (six) hours as needed for anxiety. Patient not taking: Reported on 04/03/2022 04/19/18   Lindell Spar I, NP  Insulin Pen Needle (BD PEN NEEDLE NANO 2ND GEN) 32G X 4 MM MISC Please use to inject insulin daily. 04/19/22   Shary Key, DO  JARDIANCE 10 MG TABS  tablet TAKE ONE TABLET BY MOUTH DAILY 03/28/22   Shary Key, DO  LANTUS SOLOSTAR 100 UNIT/ML Solostar Pen ADMINISTER 27 UNITS UNDER THE SKIN DAILY AT BEDTIME 03/17/22   Paige, [place] J, DO  lisinopril (ZESTRIL) 2.5 MG tablet Take 2.5 mg by mouth daily. 03/18/22   [provider]  metFORMIN (GLUCOPHAGE) 1000 MG tablet Take 1 tablet (1,000 mg total) by mouth daily with breakfast. 12/19/21   Shary Key, DO  Multiple Vitamin (MULTIVITAMIN WITH MINERALS) TABS tablet Take 1 tablet by mouth daily.    [provider]  OZEMPIC, 1 MG/DOSE, 4 MG/3ML SOPN INJECT 1 MG INTO THE SKIN ONCE A WEEK 04/24/22   Paige, [place] J, DO  paliperidone (INVEGA) 6 MG 24 hr tablet Take 6 mg by mouth 2 (two) times daily.    [provider]  REXULTI 2 MG TABS tablet Take 2 mg by mouth daily. 03/26/22   [provider]    Family History Family History  Problem Relation Age of Onset   Hypertension Mother    Depression Mother    Heart attack Maternal Grandfather    Lymphoma Maternal Grandmother    Diabetes Maternal Aunt    Cancer Maternal Aunt    Social History Social History   Tobacco Use   Smoking status: Never    Passive exposure: Never   Smokeless tobacco: Never  Substance Use Topics   Alcohol use: Yes    Alcohol/week: 0.0 standard drinks of alcohol    Comment: once every 2 weeks. Wine coolers   Drug use: No   Allergies   Fish allergy, Bee venom, Keflex [cephalexin], Relafen [nabumetone], and Shellfish allergy  Review of Systems Review of Systems Pertinent findings revealed after performing a 14 point review of systems has been noted in the history of present illness.  Physical Exam Triage Vital Signs ED Triage Vitals  Enc Vitals Group     BP 04/12/21 0827 (!) 147/82     Pulse Rate 04/12/21 0827 72     Resp 04/12/21 0827 18     Temp 04/12/21 0827 98.3 F (36.8 C)     Temp Source 04/12/21 0827 Oral     SpO2 04/12/21 0827 98 %     Weight --       Height --      Head Circumference --      Peak Flow --      Pain Score 04/12/21 0826 5     Pain Loc --      Pain Edu? --      Excl. in [place]? --   No data found.  Updated Vital Signs BP 125/88 (BP Location: Left Arm)   Pulse 94   Temp 98.9 F (37.2 C) (Oral)   Resp 18   SpO2 97%   Physical Exam Vitals and nursing note reviewed.  Constitutional:      General: He is not in acute distress.    Appearance: Normal appearance. He is not ill-appearing.  HENT:  Head: Normocephalic and atraumatic.  Eyes:     General: Lids are normal.        Right eye: No discharge.        Left eye: No discharge.     Extraocular Movements: Extraocular movements intact.     Conjunctiva/sclera: Conjunctivae normal.     Right eye: Right conjunctiva is not injected.     Left eye: Left conjunctiva is not injected.  Neck:     Trachea: Trachea and phonation normal.  Cardiovascular:     Rate and Rhythm: Normal rate and regular rhythm.     Pulses: Normal pulses.     Heart sounds: Normal heart sounds. No murmur heard.    No friction rub. No gallop.  Pulmonary:     Effort: Pulmonary effort is normal. No accessory muscle usage, prolonged expiration or respiratory distress.     Breath sounds: Normal breath sounds. No stridor, decreased air movement or transmitted upper airway sounds. No decreased breath sounds, wheezing, rhonchi or rales.  Chest:     Chest wall: No tenderness.  Abdominal:     General: Abdomen is flat. Bowel sounds are decreased. There is no distension.     Palpations: Abdomen is soft.     Tenderness: There is generalized abdominal tenderness and tenderness in the left lower quadrant. There is no right CVA tenderness or left CVA tenderness.     Hernia: No hernia is present.  Musculoskeletal:        General: Normal range of motion.     Cervical back: Normal range of motion and neck supple. Normal range of motion.  Lymphadenopathy:     Cervical: No cervical adenopathy.  Skin:    General:  Skin is warm and dry.     Findings: No erythema or rash.  Neurological:     General: No focal deficit present.     Mental Status: He is alert and oriented to person, place, and time.  Psychiatric:        Mood and Affect: Mood normal.        Behavior: Behavior normal.     Visual Acuity Right Eye Distance:   Left Eye Distance:   Bilateral Distance:    Right Eye Near:   Left Eye Near:    Bilateral Near:     UC Couse / Diagnostics / Procedures:     Radiology DG Abd 1 View  Result Date: 05/03/2022 CLINICAL DATA:  Left lower quadrant pain.  Constipation. EXAM: ABDOMEN - 1 VIEW COMPARISON:  None Available. FINDINGS: The bowel gas pattern is normal. No radio-opaque calculi or other significant radiographic abnormality are seen. IMPRESSION: Mild fecal loading in the ascending colon.  No other abnormalities. Electronically Signed   By: Dorise Bullion III M.D.   On: 05/03/2022 09:27    Procedures Procedures (including critical care time) EKG  Pending results:  Labs Reviewed - No data to display  Medications Ordered in UC: Medications - No data to display  UC Diagnoses / Final Clinical Impressions(s)   I have reviewed the triage vital signs and the nursing notes.  Pertinent labs & imaging results that were available during my care of the patient were reviewed by me and considered in my medical decision making (see chart for details).    Final diagnoses:  Chronic idiopathic constipation   Mild stool burden in ascending colon appreciated on abdominal plain film.  Patient provided with a prescription for Trulance to take daily.  Patient advised to follow-up with PCP.  ED Prescriptions     Medication Sig Dispense Auth. Provider   Plecanatide (TRULANCE) 3 MG TABS Take 1 tablet by mouth daily before breakfast. 30 tablet Lynden Oxford Scales, PA-C      PDMP not reviewed this encounter.  Pending results:  Labs Reviewed - No data to display  Discharge  Instructions:   Discharge Instructions      Your x-ray is concerning for excessive, retained stool.  I recommend that you begin a medication called Trulance that you take once daily before your breakfast meal to help move your bowels.  You are welcome to continue taking Metamucil as you have been.  Please be sure that you are drinking between 80 and 120 ounces of water every day.  Please follow-up with your primary care provider to discuss these findings and how you are doing on treatments.  Thank you for visiting urgent care today.      Disposition Upon Discharge:  Condition: stable for discharge home  Patient presented with an acute illness with associated systemic symptoms and significant discomfort requiring urgent management. In my opinion, this is a condition that a prudent lay person (someone who possesses an average knowledge of health and medicine) may potentially expect to result in complications if not addressed urgently such as respiratory distress, impairment of bodily function or dysfunction of bodily organs.   Routine symptom specific, illness specific and/or disease specific instructions were discussed with the patient and/or caregiver at length.   As such, the patient has been evaluated and assessed, work-up was performed and treatment was provided in alignment with urgent care protocols and evidence based medicine.  Patient/parent/caregiver has been advised that the patient may require follow up for further testing and treatment if the symptoms continue in spite of treatment, as clinically indicated and appropriate.  Patient/parent/caregiver has been advised to return to the [facility] or PCP if no better; to PCP or the Emergency Department if new signs and symptoms develop, or if the current signs or symptoms continue to change or worsen for further workup, evaluation and treatment as clinically indicated and appropriate  The patient will follow up with their current PCP if  and as advised. If the patient does not currently have a PCP we will assist them in obtaining one.   The patient may need specialty follow up if the symptoms continue, in spite of conservative treatment and management, for further workup, evaluation, consultation and treatment as clinically indicated and appropriate.   Patient/parent/caregiver verbalized understanding and agreement of plan as discussed.  All questions were addressed during visit.  Please see discharge instructions below for further details of plan.  This office note has been dictated using Museum/gallery curator.  Unfortunately, this method of dictation can sometimes lead to typographical or grammatical errors.  I apologize for your inconvenience in advance if this occurs.  Please do not hesitate to reach out to me if clarification is needed.      Lynden Oxford Scales, PA-C 05/03/22 [phone]

## 2022-05-06 ENCOUNTER — Ambulatory Visit (INDEPENDENT_AMBULATORY_CARE_PROVIDER_SITE_OTHER): Payer: Medicare Other | Admitting: Student

## 2022-05-06 VITALS — BP 114/80 | HR 83 | Ht 72.0 in | Wt 299.2 lb

## 2022-05-06 DIAGNOSIS — K5900 Constipation, unspecified: Secondary | ICD-10-CM | POA: Insufficient documentation

## 2022-05-06 DIAGNOSIS — K5904 Chronic idiopathic constipation: Secondary | ICD-10-CM | POA: Diagnosis not present

## 2022-05-06 NOTE — Progress Notes (Signed)
  SUBJECTIVE:   CHIEF COMPLAINT / HPI:   Patient presents today with mother for follow-up after urgent care visit on 05/03/2022 for left lower abdominal pain which started on 05/02/2022.  He was diagnosed with chronic idiopathic constipation and provided 1 month subscription of plecanatide (Trulance). He endorses 2 soft BMs per day since the urgent care visit. His stomach pain remains unchanged. Describes the pain in lower left side of stomach as dull. Worse when he takes in deep breaths or yawns. He had a hard time getting to sleep last night. Denies fevers. Denies nausea/vomiting, blood in stool. Urinating appropriately. Eating does not make this worse.  He is still passing gas as well.  PERTINENT  PMH / PSH: T2DM, CKD 3B, schizophrenia, diverticulosis, HTN  OBJECTIVE:  BP 114/80   Pulse 83   Ht 6' (1.829 m)   Wt 299 lb 3.2 oz (135.7 kg)   SpO2 95%   BMI 40.58 kg/m  General: Awake, alert, NAD CV: RRR, no murmurs auscultated Pulm: CTAB, normal WOB Abdomen: Soft, obese abdomen, tender to left lower quadrant/flank, no appreciable masses or herniation  ASSESSMENT/PLAN:  Chronic idiopathic constipation Assessment & Plan: Likely multifactorial relating to diet/obesity and psychiatric medications.  He does have history of diverticulosis but there are no signs of diverticulitis.  Less concern for urological pathology nor kidney stones.  No history of abdominal surgeries therefore less likely to be SBO.  Reviewed urgent care note and imaging and I agree with constipation.  Discontinue Trulance.  I recommend MiraLAX and senna.  Follow-up as needed   Return if symptoms worsen or fail to improve. Wells Guiles, DO 05/06/2022, 5:07 PM PGY-2, [place]

## 2022-05-06 NOTE — Patient Instructions (Signed)
It was great to see you today! Thank you for choosing [facility] for your primary care. Silva Bandy was seen for constipation.  Today we addressed: I do still believe this is constipation as there are not further symptoms that have become present.  You do not need to continue the Trulance medication but I would highly recommend that you take MiraLAX and senna every day which will count as a "moosher" and a "pusher" to help you move the stool out of your bowels.  Most kids and adults need to stool 1 to 3 times a day every day to get rid of all of the stool we make by eating meals. If you do not stool for several days in a row, the stool builds up like a snowball and becomes hard and even more difficult to pass. This can cause mild to severe abdominal pain, nausea and sometimes vomiting. Some kids and adults can even have watery stool that looks like diarrhea and stool "accidents" due to a small amount of stool that is traveling around a large ball of stool.   Sometimes this can be difficult to understand, but there is a great video on the importance of pooping regularly. Please watch "The Poo in You" video available on YouTube or [email or web address]    Managing chronic constipation - Some children or adults need to be on a stool softener regularly to prevent constipation - Miralax and Lactulose are very safe medications that we use often - If you are using Miralax, mix 1 capful into 8 ounces of fluid and give once a day. If you continue to have constipation, you can increase to 2 times a day or 3 times a day. If you have loose stools, you can reduce to every other day or every 3rd day.  - If you are using Lactulose, give once a day. If you continue to have constipation, you can increase to 2 times a day or 3 times a day. If you are having loose stools, you can reduce to every other day or every 3rd day.    If you haven't already, sign up for My Chart to have easy access to your labs  results, and communication with your primary care physician.  Call the clinic at [phone] if your symptoms worsen or you have any concerns.  You should return to our clinic Return if symptoms worsen or fail to improve. Please arrive 15 minutes before your appointment to ensure smooth check in process.  We appreciate your efforts in making this happen.  Thank you for allowing me to participate in your care, Wells Guiles, DO 05/06/2022, 3:11 PM PGY-2, [place]

## 2022-05-06 NOTE — Assessment & Plan Note (Signed)
Likely multifactorial relating to diet/obesity and psychiatric medications.  He does have history of diverticulosis but there are no signs of diverticulitis.  Less concern for urological pathology nor kidney stones.  No history of abdominal surgeries therefore less likely to be SBO.  Reviewed urgent care note and imaging and I agree with constipation.  Discontinue Trulance.  I recommend MiraLAX and senna.  Follow-up as needed

## 2022-05-13 ENCOUNTER — Encounter: Payer: Self-pay | Admitting: Family Medicine

## 2022-05-13 ENCOUNTER — Ambulatory Visit (INDEPENDENT_AMBULATORY_CARE_PROVIDER_SITE_OTHER): Payer: Medicare Other | Admitting: Family Medicine

## 2022-05-13 VITALS — BP 110/80 | HR 76 | Ht 72.0 in | Wt 301.0 lb

## 2022-05-13 DIAGNOSIS — M79631 Pain in right forearm: Secondary | ICD-10-CM

## 2022-05-13 DIAGNOSIS — Z23 Encounter for immunization: Secondary | ICD-10-CM

## 2022-05-13 DIAGNOSIS — E118 Type 2 diabetes mellitus with unspecified complications: Secondary | ICD-10-CM | POA: Diagnosis not present

## 2022-05-13 DIAGNOSIS — Z794 Long term (current) use of insulin: Secondary | ICD-10-CM

## 2022-05-13 MED ORDER — OZEMPIC (2 MG/DOSE) 8 MG/3ML ~~LOC~~ SOPN: 2.0000 mg | PEN_INJECTOR | SUBCUTANEOUS | 0 refills | Status: DC

## 2022-05-13 NOTE — Progress Notes (Unsigned)
    SUBJECTIVE:   CHIEF COMPLAINT / HPI:   Patient presents for follow up  Was seen on 11/21 for constipation and we advised to use Miralax and Senna. States his bowel movements have improved. Increased fiber pills and then had the constipation   Was seen on 10/26 for SI. Follows with psychiatry weekly who has adjusted his medication. Currently taking: Wellbutrin 300 mg daily, Luvox 150 mg at bedtime, Invega 6 mg twice daily, Rexulti 2 mg daily  States for the past month has some forearm pain when he squeezes things. No inciting trauma or injury   Fasting blood sugars have been in the 120s   Health maintenance Did have eye exam year    PERTINENT  PMH / PSH: Reviewed   OBJECTIVE:   BP 110/80   Pulse 76   Ht 6' (1.829 m)   Wt (!) 301 lb (136.5 kg)   SpO2 94%   BMI 40.82 kg/m    Physical exam General: well appearing, NAD Cardiovascular: RRR, no murmurs Lungs: CTAB. Normal WOB Abdomen: soft, non-distended, non-tender Skin: warm, dry. No edema  ASSESSMENT/PLAN:   No problem-specific Assessment & Plan notes found for this encounter.     [place]

## 2022-05-13 NOTE — Patient Instructions (Signed)
It was great seeing you today!  We followed up on a few things and I have increased your Ozempic to '2mg'$  daily.   Lets work on cutting down on fried and fatty foods, and eat vegetables with every meal and try getting in physical activity daily  For your arm you can alternate ice and heat when it bothers you and do squeeze stretches daily with a stress ball. Return if it does not improve over the next couple of weeks.   Let me know what psychiatry says about your medication and erectile issues.  Visit Reminders: - Stop by the pharmacy to pick up your prescriptions  - Continue to work on your healthy eating habits and incorporating exercise into your daily life.  Feel free to call with any questions or concerns at any time, at [phone].   Take care,  Dr. Shary Key [facility]

## 2022-05-14 DIAGNOSIS — M79631 Pain in right forearm: Secondary | ICD-10-CM | POA: Insufficient documentation

## 2022-05-14 NOTE — Assessment & Plan Note (Signed)
Occurring for the past month without inciting injury. Only occurs when he squeezes objects. Physical exam without skin changes and with normal ROM and strength. Suspect muscle strain. Discussed alternating between ice and heat and doing exercises with a squeeze ball. Return precautions discussed if no improvement after the next couple of weeks.

## 2022-05-14 NOTE — Assessment & Plan Note (Signed)
Last A1c 1 month ago 6.0. Increased Ozempic to '2mg'$  weekly and he will continue Metformin. Discussed making dietary changes and getting in physical activity.

## 2022-05-21 ENCOUNTER — Telehealth: Payer: Self-pay

## 2022-05-21 NOTE — Telephone Encounter (Signed)
Patient calls nurse line requesting to speak with PCP in regards to erectile dysfunction.    He reports he spoke with his behavioral health provider and they reported Amlodipine or his DM medications are the cause of ED.   Will forward to PCP.

## 2022-06-18 ENCOUNTER — Other Ambulatory Visit: Payer: Self-pay | Admitting: Family Medicine

## 2022-06-25 ENCOUNTER — Ambulatory Visit
Admission: EM | Admit: 2022-06-25 | Discharge: 2022-06-25 | Disposition: A | Discharge status: Home / Self Care | Payer: 59

## 2022-06-25 ENCOUNTER — Encounter (HOSPITAL_BASED_OUTPATIENT_CLINIC_OR_DEPARTMENT_OTHER): Payer: Self-pay

## 2022-06-25 ENCOUNTER — Emergency Department (HOSPITAL_BASED_OUTPATIENT_CLINIC_OR_DEPARTMENT_OTHER)
Admission: EM | Admit: 2022-06-25 | Discharge: 2022-06-25 | Disposition: A | Discharge status: Home / Self Care | Payer: 59 | Attending: Emergency Medicine | Admitting: Emergency Medicine

## 2022-06-25 ENCOUNTER — Emergency Department (HOSPITAL_BASED_OUTPATIENT_CLINIC_OR_DEPARTMENT_OTHER): Payer: 59

## 2022-06-25 ENCOUNTER — Other Ambulatory Visit: Payer: Self-pay

## 2022-06-25 DIAGNOSIS — R1032 Left lower quadrant pain: Secondary | ICD-10-CM

## 2022-06-25 DIAGNOSIS — E119 Type 2 diabetes mellitus without complications: Secondary | ICD-10-CM | POA: Insufficient documentation

## 2022-06-25 DIAGNOSIS — I10 Essential (primary) hypertension: Secondary | ICD-10-CM | POA: Diagnosis not present

## 2022-06-25 DIAGNOSIS — Z7982 Long term (current) use of aspirin: Secondary | ICD-10-CM | POA: Diagnosis not present

## 2022-06-25 DIAGNOSIS — R109 Unspecified abdominal pain: Secondary | ICD-10-CM

## 2022-06-25 DIAGNOSIS — Z794 Long term (current) use of insulin: Secondary | ICD-10-CM | POA: Insufficient documentation

## 2022-06-25 DIAGNOSIS — K5790 Diverticulosis of intestine, part unspecified, without perforation or abscess without bleeding: Secondary | ICD-10-CM

## 2022-06-25 DIAGNOSIS — N189 Chronic kidney disease, unspecified: Secondary | ICD-10-CM | POA: Diagnosis not present

## 2022-06-25 DIAGNOSIS — R10A2 Flank pain, left side: Secondary | ICD-10-CM

## 2022-06-25 DIAGNOSIS — K5792 Diverticulitis of intestine, part unspecified, without perforation or abscess without bleeding: Secondary | ICD-10-CM | POA: Insufficient documentation

## 2022-06-25 LAB — CBC
HCT: 43.7 % (ref 39.0–52.0)
Hemoglobin: 14.2 g/dL (ref 13.0–17.0)
MCH: 29.2 pg (ref 26.0–34.0)
MCHC: 32.5 g/dL (ref 30.0–36.0)
MCV: 89.7 fL (ref 80.0–100.0)
Platelets: 310 10*3/uL (ref 150–400)
RBC: 4.87 MIL/uL (ref 4.22–5.81)
RDW: 13.6 % (ref 11.5–15.5)
WBC: 10.3 10*3/uL (ref 4.0–10.5)
nRBC: 0 % (ref 0.0–0.2)

## 2022-06-25 LAB — COMPREHENSIVE METABOLIC PANEL
ALT: 27 U/L (ref 0–44)
AST: 30 U/L (ref 15–41)
Albumin: 4.5 g/dL (ref 3.5–5.0)
Alkaline Phosphatase: 51 U/L (ref 38–126)
Anion gap: 12 (ref 5–15)
BUN: 18 mg/dL (ref 6–20)
CO2: 26 mmol/L (ref 22–32)
Calcium: 10.1 mg/dL (ref 8.9–10.3)
Chloride: 100 mmol/L (ref 98–111)
Creatinine, Ser: 1.56 mg/dL — ABNORMAL HIGH (ref 0.61–1.24)
GFR, Estimated: 55 mL/min — ABNORMAL LOW (ref >60)
Glucose, Bld: 99 mg/dL (ref 70–99)
Potassium: 5.1 mmol/L (ref 3.5–5.1)
Sodium: 138 mmol/L (ref 135–145)
Total Bilirubin: 0.5 mg/dL (ref 0.3–1.2)
Total Protein: 8.4 g/dL — ABNORMAL HIGH (ref 6.5–8.1)

## 2022-06-25 LAB — URINALYSIS, ROUTINE W REFLEX MICROSCOPIC
Bacteria, UA: NONE SEEN
Bilirubin Urine: NEGATIVE
Glucose, UA: >1000 mg/dL — AB
Hgb urine dipstick: NEGATIVE
Ketones, ur: NEGATIVE mg/dL
Leukocytes,Ua: NEGATIVE
Nitrite: NEGATIVE
Specific Gravity, Urine: 1.042 — ABNORMAL HIGH (ref 1.005–1.030)
pH: 5 (ref 5.0–8.0)

## 2022-06-25 LAB — LIPASE, BLOOD: Lipase: 67 U/L — ABNORMAL HIGH (ref 11–51)

## 2022-06-25 MED ORDER — CEFUROXIME AXETIL 500 MG PO TABS: 500.0000 mg | ORAL_TABLET | Freq: Two times a day (BID) | ORAL | 0 refills | Status: AC

## 2022-06-25 MED ORDER — SODIUM CHLORIDE 0.9 % IV SOLN
2.0000 g | Freq: Once | INTRAVENOUS | Status: AC
  Administered 2022-06-25: 2 g via INTRAVENOUS
  Filled 2022-06-25: qty 20

## 2022-06-25 MED ORDER — HYDROCODONE-ACETAMINOPHEN 5-325 MG PO TABS: 1.0000 | ORAL_TABLET | Freq: Four times a day (QID) | ORAL | 0 refills | Status: DC | PRN

## 2022-06-25 MED ORDER — IOHEXOL 300 MG/ML  SOLN
100.0000 mL | Freq: Once | INTRAMUSCULAR | Status: AC | PRN
  Administered 2022-06-25: 100 mL via INTRAVENOUS

## 2022-06-25 MED ORDER — METRONIDAZOLE 500 MG PO TABS: 500.0000 mg | ORAL_TABLET | Freq: Two times a day (BID) | ORAL | 0 refills | Status: AC

## 2022-06-25 MED ORDER — SODIUM CHLORIDE 0.9 % IV BOLUS
1000.0000 mL | Freq: Once | INTRAVENOUS | Status: AC
  Administered 2022-06-25: 1000 mL via INTRAVENOUS

## 2022-06-25 MED ORDER — METRONIDAZOLE 500 MG PO TABS: 500.0000 mg | ORAL_TABLET | Freq: Two times a day (BID) | ORAL | 0 refills | Status: DC

## 2022-06-25 MED ORDER — MORPHINE SULFATE (PF) 4 MG/ML IV SOLN
4.0000 mg | Freq: Once | INTRAVENOUS | Status: AC
  Administered 2022-06-25: 4 mg via INTRAVENOUS
  Filled 2022-06-25: qty 1

## 2022-06-25 MED ORDER — CEFUROXIME AXETIL 500 MG PO TABS: 500.0000 mg | ORAL_TABLET | Freq: Two times a day (BID) | ORAL | 0 refills | Status: DC

## 2022-06-25 MED ORDER — METRONIDAZOLE 500 MG/100ML IV SOLN
500.0000 mg | Freq: Once | INTRAVENOUS | Status: AC
  Administered 2022-06-25: 500 mg via INTRAVENOUS
  Filled 2022-06-25: qty 100

## 2022-06-25 NOTE — ED Notes (Signed)
Patient is being discharged from the Urgent Care and sent to the Emergency Department via POV . Per [place], [place], patient is in need of higher level of care due to abd pain. Patient is aware and verbalizes understanding of plan of care.  Vitals:   06/25/22 1526  BP: 108/76  Pulse: (!) 106  Resp: 15  Temp: 98.5 F (36.9 C)  SpO2: 92%

## 2022-06-25 NOTE — ED Triage Notes (Signed)
Patient here POV from Home.  Endorses Lower Mid ABD Pain that was Intermittent Initially but has become constant and worse in LLQ 2-3 Days ago. Sent by UC for Assessment.  No N/V/D. No Fevers.  NAD Noted during Triage. A&Ox4. GCS 15. Ambulatory.

## 2022-06-25 NOTE — ED Provider Notes (Signed)
[facility] EMERGENCY DEPT Provider Note   CSN: [id number] Arrival date & time: 06/25/22  1621     History  Chief Complaint  Patient presents with   Abdominal Pain    Marc Long is a 47 y.o. male.   Abdominal Pain    Patient has a history of depression gallstones obesity diabetes hypertension who presents to the ED with complaints of abdominal pain.  Patient states he has been having abdominal pain in his lower abdomen intermittently but in the last few days it has become more constant and worse.  Patient states initially was in the left lower quadrant but has moved up a little bit now.  He has not had any issues with constipation.  No diarrhea no fevers.  He went to an urgent care today who suggested he come to the ED for evaluation of possible diverticulitis.  Home Medications Prior to Admission medications   Medication Sig Start Date End Date Taking? Authorizing Provider  cefUROXime (CEFTIN) 500 MG tablet Take 1 tablet (500 mg total) by mouth 2 (two) times daily with a meal for 10 days. 06/25/22 07/05/22 Yes Dorie Rank, MD  HYDROcodone-acetaminophen (NORCO/VICODIN) 5-325 MG tablet Take 1 tablet by mouth every 6 (six) hours as needed. 06/25/22  Yes Dorie Rank, MD  metroNIDAZOLE (FLAGYL) 500 MG tablet Take 1 tablet (500 mg total) by mouth 2 (two) times daily for 10 days. 06/25/22 07/05/22 Yes Dorie Rank, MD  aspirin EC 81 MG tablet Take 81 mg by mouth daily. Patient not taking: Reported on 04/03/2022    [provider]  atorvastatin (LIPITOR) 40 MG tablet TAKE ONE TABLET BY MOUTH DAILY 03/24/22   Shary Key, DO  Blood Pressure Monitoring (COMFORT TOUCH BP CUFF/LARGE) MISC 1 each by Does not apply route once a week. 04/18/21   Shary Key, DO  buPROPion (WELLBUTRIN XL) 300 MG 24 hr tablet Take 1 tablet (300 mg total) by mouth daily. 01/14/19   Johnn Hai, MD  cyclobenzaprine (FLEXERIL) 10 MG tablet Take 1 tablet (10 mg total) by mouth at  bedtime. Patient not taking: Reported on 04/03/2022 04/01/21   Shary Key, DO  diclofenac Sodium (VOLTAREN) 1 % GEL Apply 4 g topically as needed (Shoulder pain). Patient not taking: Reported on 04/03/2022 10/17/21   Sharion Settler, DO  fluvoxaMINE (LUVOX) 50 MG tablet Take 3 tablets (150 mg total) by mouth at bedtime. 01/14/19   Johnn Hai, MD  glucose blood (ACCU-CHEK GUIDE) test strip Please use to check blood sugar once daily. 01/21/22   [name], MD  hydrOXYzine (ATARAX/VISTARIL) 50 MG tablet Take 1 tablet (50 mg total) by mouth every 6 (six) hours as needed for anxiety. Patient not taking: Reported on 04/03/2022 04/19/18   Lindell Spar I, NP  Insulin Pen Needle (BD PEN NEEDLE NANO 2ND GEN) 32G X 4 MM MISC Please use to inject insulin daily. 04/19/22   Shary Key, DO  JARDIANCE 10 MG TABS tablet TAKE ONE TABLET BY MOUTH DAILY 03/28/22   Shary Key, DO  LANTUS SOLOSTAR 100 UNIT/ML Solostar Pen ADMINISTER 27 UNITS UNDER THE SKIN DAILY AT BEDTIME 03/17/22   Paige, [place] J, DO  lisinopril (ZESTRIL) 2.5 MG tablet Take 2.5 mg by mouth daily. 03/18/22   [provider]  metFORMIN (GLUCOPHAGE) 1000 MG tablet Take 1 tablet (1,000 mg total) by mouth daily with breakfast. 12/19/21   Shary Key, DO  Multiple Vitamin (MULTIVITAMIN WITH MINERALS) TABS tablet Take 1 tablet by  mouth daily.    [provider]  paliperidone (INVEGA) 6 MG 24 hr tablet Take 6 mg by mouth 2 (two) times daily.    [provider]  REXULTI 2 MG TABS tablet Take 2 mg by mouth daily. 03/26/22   [provider]  Semaglutide, 2 MG/DOSE, (OZEMPIC, 2 MG/DOSE,) 8 MG/3ML SOPN INJECT TWO MG INTO THE SKIN ONCE A WEEK 06/18/22   Shary Key, DO      Allergies    Fish allergy, Bee venom, Keflex [cephalexin], Relafen [nabumetone], and Shellfish allergy    Review of Systems   Review of Systems  Gastrointestinal:  Positive for abdominal pain.    Physical Exam Updated  Vital Signs BP 120/72 (BP Location: Right Arm)   Pulse 92   Temp 98.1 F (36.7 C) (Oral)   Resp 18   Ht 1.803 m ('5\' 11"'$ )   Wt 129.3 kg   SpO2 97%   BMI 39.76 kg/m  Physical Exam Vitals and nursing note reviewed.  Constitutional:      General: He is not in acute distress.    Appearance: He is well-developed.  HENT:     Head: Normocephalic and atraumatic.     Right Ear: External ear normal.     Left Ear: External ear normal.  Eyes:     General: No scleral icterus.       Right eye: No discharge.        Left eye: No discharge.     Conjunctiva/sclera: Conjunctivae normal.  Neck:     Trachea: No tracheal deviation.  Cardiovascular:     Rate and Rhythm: Normal rate and regular rhythm.  Pulmonary:     Effort: Pulmonary effort is normal. No respiratory distress.     Breath sounds: Normal breath sounds. No stridor. No wheezing or rales.  Abdominal:     General: Bowel sounds are normal. There is no distension.     Palpations: Abdomen is soft.     Tenderness: There is abdominal tenderness in the left lower quadrant. There is no guarding or rebound.     Hernia: No hernia is present.  Musculoskeletal:        General: No tenderness or deformity.     Cervical back: Neck supple.  Skin:    General: Skin is warm and dry.     Findings: No rash.  Neurological:     General: No focal deficit present.     Mental Status: He is alert.     Cranial Nerves: No cranial nerve deficit, dysarthria or facial asymmetry.     Sensory: No sensory deficit.     Motor: No abnormal muscle tone or seizure activity.     Coordination: Coordination normal.  Psychiatric:        Mood and Affect: Mood normal.     ED Results / Procedures / Treatments   Labs (all labs ordered are listed, but only abnormal results are displayed) Labs Reviewed  LIPASE, BLOOD - Abnormal; Notable for the following components:      Result Value   Lipase 67 (*)    All other components within normal limits  COMPREHENSIVE  METABOLIC PANEL - Abnormal; Notable for the following components:   Creatinine, Ser 1.56 (*)    Total Protein 8.4 (*)    GFR, Estimated 55 (*)    All other components within normal limits  URINALYSIS, ROUTINE W REFLEX MICROSCOPIC - Abnormal; Notable for the following components:   Specific Gravity, Urine 1.042 (*)  Glucose, UA >1,000 (*)    Protein, ur TRACE (*)    All other components within normal limits  CBC    EKG None  Radiology CT ABDOMEN PELVIS W CONTRAST  Result Date: 06/25/2022 CLINICAL DATA:  Left lower quadrant pain EXAM: CT ABDOMEN AND PELVIS WITH CONTRAST TECHNIQUE: Multidetector CT imaging of the abdomen and pelvis was performed using the standard protocol following bolus administration of intravenous contrast. RADIATION DOSE REDUCTION: This exam was performed according to the departmental dose-optimization program which includes automated exposure control, adjustment of the mA and/or kV according to patient size and/or use of iterative reconstruction technique. CONTRAST:  182m OMNIPAQUE IOHEXOL 300 MG/ML  SOLN COMPARISON:  Radiograph 05/03/2022, CT 09/05/2015 FINDINGS: Lower chest: Lung bases are clear. Hepatobiliary: Hepatic steatosis. Lamellated gallstone. No biliary dilatation Pancreas: Unremarkable. No pancreatic ductal dilatation or surrounding inflammatory changes. Spleen: Normal in size without focal abnormality. Adrenals/Urinary Tract: Adrenal glands are unremarkable. Kidneys are normal, without renal calculi, focal lesion, or hydronephrosis. Bladder is unremarkable. Stomach/Bowel: The stomach is within normal limits. No dilated small bowel. Negative appendix. Diffuse diverticular disease of the colon. Focal inflammatory change surrounding left upper quadrant diverticulum. Wall thickening and moderate inflammatory change at the mid and distal descending colon consistent with diverticulitis. No perforation or abscess Vascular/Lymphatic: No significant vascular findings are  present. No enlarged abdominal or pelvic lymph nodes. Reproductive: Prostate is unremarkable. Other: No abdominal wall hernia or abnormality. No abdominopelvic ascites. Musculoskeletal: No acute or significant osseous findings. IMPRESSION: 1. Findings consistent with acute diverticulitis involving the mid and distal descending colon. No perforation or abscess. 2. Hepatic steatosis. 3. Gallstone. Electronically Signed   By: KDonavan FoilM.D.   On: 06/25/2022 19:40    Procedures Procedures    Medications Ordered in ED Medications  cefTRIAXone (ROCEPHIN) 2 g in sodium chloride 0.9 % 100 mL IVPB (2 g Intravenous New Bag/Given 06/25/22 2031)    And  metroNIDAZOLE (FLAGYL) IVPB 500 mg (500 mg Intravenous New Bag/Given 06/25/22 2035)  sodium chloride 0.9 % bolus 1,000 mL ( Intravenous Stopped 06/25/22 2013)  iohexol (OMNIPAQUE) 300 MG/ML solution 100 mL (100 mLs Intravenous Contrast Given 06/25/22 1918)  morphine (PF) 4 MG/ML injection 4 mg (4 mg Intravenous Given 06/25/22 2036)    ED Course/ Medical Decision Making/ A&P Clinical Course as of 06/25/22 2043  Wed Jun 25, 2022  1955 CT scan shows findings consistent with acute diverticulitis involving the mid and distal descending colon, no perforation or abscess [JK]    Clinical Course User Index [JK] KDorie Rank MD                           Medical Decision Making Frontal diagnosis includes but not limited to diverticulitis colitis, pyelonephritis, renal colic  Problems Addressed: Diverticulitis: acute illness or injury that poses a threat to life or bodily functions  Amount and/or Complexity of Data Reviewed Labs: ordered. Decision-making details documented in ED Course. Radiology: ordered and independent interpretation performed.  Risk Prescription drug management.   Patient presented to ED for eval ration of abdominal pain.  No fevers no vomiting.  Symptoms have been progressing so he came to the ED for evaluation after being evaluated  in urgent care.  Patient's labs are unremarkable however based on his symptoms CT scan was performed.  It does show evidence of acute diverticulitis.  Patient is not having any fevers.  He has not had any vomiting.  He does not have  a significant leukocytosis.  The CT scan does not show any signs of perforation or abscess.  Patient was given a dose of IV antibiotics.  I think he is appropriate for outpatient management.  Warning signs and precautions discussed.  Discussed need for close follow-up.        Final Clinical Impression(s) / ED Diagnoses Final diagnoses:  Diverticulitis    Rx / DC Orders ED Discharge Orders          Ordered    metroNIDAZOLE (FLAGYL) 500 MG tablet  2 times daily        06/25/22 2041    cefUROXime (CEFTIN) 500 MG tablet  2 times daily with meals        06/25/22 2041    HYDROcodone-acetaminophen (NORCO/VICODIN) 5-325 MG tablet  Every 6 hours PRN        06/25/22 2041              Dorie Rank, MD 06/25/22 2043

## 2022-06-25 NOTE — Discharge Instructions (Signed)
Please head to the emergency room as you are in need of a higher level of care than we provide in the urgent care setting. My concern is that you have acute diverticulitis, acute abdomen given your history of diverticulosis. This is complicated by having chronic kidney disease and therefore the goal should be to get CT imaging to rule out diverticulitis and prove the need for antibiotics. Please head to the emergency room at [facility] now.

## 2022-06-25 NOTE — Discharge Instructions (Addendum)
Take the medications as needed for pain.  Make sure to take the antibiotics until finished.  Follow-up with your primary care doctor to be rechecked.  Return to the ER for fever vomiting worsening pain.

## 2022-06-25 NOTE — ED Triage Notes (Addendum)
Pt c/o lower abd pain and left flank pain x 1 week-pain worse with deep breaths-denies n/v/d-NAD-steady gait

## 2022-06-25 NOTE — ED Provider Notes (Addendum)
[facility] - URGENT [facility]ARE C[facility]  Note:  This document was prepared using Systems analyst and may include unintentional dictation errors.  MRN: [id number] DOB: 1976/05/16  Subjective:   Marc Long is a 47 y.o. male presenting for 1 week history of acute onset persistent and worsening left lower quadrant, left flank pain. Was initially at lower abdomen but has moved to the left. Rates the pain as moderate to severe but any little movement can exacerbate it making severe. Pain is fairly constant now. No fever, nausea, vomiting, bloody stools, constipation, dysuria, urinary frequency, hematuria. No history of renal stones. Has been having regular bowel movements. Has a history of diverticulosis but has not had diverticulitis. Has CKD likely due to his diabetes.  Takes insulin for this.   No current facility-administered medications for this encounter.  Current Outpatient Medications:    aspirin EC 81 MG tablet, Take 81 mg by mouth daily. (Patient not taking: Reported on 04/03/2022), Disp: , Rfl:    atorvastatin (LIPITOR) 40 MG tablet, TAKE ONE TABLET BY MOUTH DAILY, Disp: 90 tablet, Rfl: 3   Blood Pressure Monitoring (COMFORT TOUCH BP CUFF/LARGE) MISC, 1 each by Does not apply route once a week., Disp: 1 each, Rfl: 0   buPROPion (WELLBUTRIN XL) 300 MG 24 hr tablet, Take 1 tablet (300 mg total) by mouth daily., Disp: 90 tablet, Rfl: 1   cyclobenzaprine (FLEXERIL) 10 MG tablet, Take 1 tablet (10 mg total) by mouth at bedtime. (Patient not taking: Reported on 04/03/2022), Disp: 20 tablet, Rfl: 0   diclofenac Sodium (VOLTAREN) 1 % GEL, Apply 4 g topically as needed (Shoulder pain). (Patient not taking: Reported on 04/03/2022), Disp: 50 g, Rfl: 0   fluvoxaMINE (LUVOX) 50 MG tablet, Take 3 tablets (150 mg total) by mouth at bedtime., Disp: 90 tablet, Rfl: 1   glucose blood (ACCU-CHEK GUIDE) test strip, Please use to check blood sugar once daily., Disp: 100 each, Rfl: 12    hydrOXYzine (ATARAX/VISTARIL) 50 MG tablet, Take 1 tablet (50 mg total) by mouth every 6 (six) hours as needed for anxiety. (Patient not taking: Reported on 04/03/2022), Disp: 60 tablet, Rfl: 0   Insulin Pen Needle (BD PEN NEEDLE NANO 2ND GEN) 32G X 4 MM MISC, Please use to inject insulin daily., Disp: 100 each, Rfl: 3   JARDIANCE 10 MG TABS tablet, TAKE ONE TABLET BY MOUTH DAILY, Disp: 30 tablet, Rfl: 3   LANTUS SOLOSTAR 100 UNIT/ML Solostar Pen, ADMINISTER 27 UNITS UNDER THE SKIN DAILY AT BEDTIME, Disp: 15 mL, Rfl: 3   lisinopril (ZESTRIL) 2.5 MG tablet, Take 2.5 mg by mouth daily., Disp: , Rfl:    metFORMIN (GLUCOPHAGE) 1000 MG tablet, Take 1 tablet (1,000 mg total) by mouth daily with breakfast., Disp: 30 tablet, Rfl: 0   Multiple Vitamin (MULTIVITAMIN WITH MINERALS) TABS tablet, Take 1 tablet by mouth daily., Disp: , Rfl:    paliperidone (INVEGA) 6 MG 24 hr tablet, Take 6 mg by mouth 2 (two) times daily., Disp: , Rfl:    REXULTI 2 MG TABS tablet, Take 2 mg by mouth daily., Disp: , Rfl:    Semaglutide, 2 MG/DOSE, (OZEMPIC, 2 MG/DOSE,) 8 MG/3ML SOPN, INJECT TWO MG INTO THE SKIN ONCE A WEEK, Disp: 3 mL, Rfl: 3   Allergies  Allergen Reactions   Fish Allergy Nausea Only   Bee Venom Swelling   Keflex [Cephalexin]     UPSET STOMACH   Relafen [Nabumetone]     Blood in stool  Shellfish Allergy Nausea Only    Past Medical History:  Diagnosis Date   Depression    Diabetes mellitus due to underlying condition with unspecified complications ([place]) 08/21/1694   Diabetes mellitus without complication ([place])    DM (diabetes mellitus), type 2, uncontrolled 12/12/2016   Gallstones    H/O: HTN (hypertension) 07/18/2021   Hyperglycemia due to type 2 diabetes mellitus ([place]) 12/12/2016   Obesity, Class III, BMI 40-49.9 (morbid obesity) ([place])      History reviewed. No pertinent surgical history.  Family History  Problem Relation Age of Onset   Hypertension Mother    Depression Mother    Heart attack  Maternal Grandfather    Lymphoma Maternal Grandmother    Diabetes Maternal Aunt    Cancer Maternal Aunt     Social History   Tobacco Use   Smoking status: Never    Passive exposure: Never   Smokeless tobacco: Never  Substance Use Topics   Alcohol use: Not Currently    Comment: occ   Drug use: No    ROS   Objective:   Vitals: BP 108/76 (BP Location: Right Arm)   Pulse (!) 106   Temp 98.5 F (36.9 C) (Oral)   Resp 15   SpO2 92%   Physical Exam Constitutional:      General: He is not in acute distress.    Appearance: Normal appearance. He is well-developed and normal weight. He is not ill-appearing, toxic-appearing or diaphoretic.  HENT:     Head: Normocephalic and atraumatic.     Right Ear: External ear normal.     Left Ear: External ear normal.     Nose: Nose normal.     Mouth/Throat:     Mouth: Mucous membranes are moist.  Eyes:     General: No scleral icterus.       Right eye: No discharge.        Left eye: No discharge.     Extraocular Movements: Extraocular movements intact.     Conjunctiva/sclera: Conjunctivae normal.  Cardiovascular:     Rate and Rhythm: Normal rate and regular rhythm.     Heart sounds: Normal heart sounds. No murmur heard.    No friction rub. No gallop.  Pulmonary:     Effort: Pulmonary effort is normal. No respiratory distress.     Breath sounds: Normal breath sounds. No stridor. No wheezing, rhonchi or rales.  Abdominal:     General: Bowel sounds are normal. There is no distension.     Palpations: Abdomen is soft. There is no mass.     Tenderness: There is abdominal tenderness (left flank side as well). There is no right CVA tenderness, left CVA tenderness, guarding or rebound.  Skin:    General: Skin is warm and dry.  Neurological:     Mental Status: He is alert and oriented to person, place, and time.  Psychiatric:        Mood and Affect: Mood normal.        Behavior: Behavior normal.        Thought Content: Thought content  normal.     Assessment and Plan :   PDMP not reviewed this encounter.  1. Acute abdominal pain in left flank   2. Abdominal pain, left lower quadrant   3. Diverticulosis   4. Chronic kidney disease, unspecified CKD stage     Patient is in need of higher level of care than we can provide in the urgent care setting.  My concern  is that he may have diverticulitis and needs point-of-care labs, consideration for CT scan of the abdomen pelvis to rule this out.  Also on the differential is peritonitis, colitis, malignancy.  However, other concern was needed antibiotics for diverticulitis and I advised against this without proving the need for them which is especially important given his CKD.  Patient contracts for safety, his wife will take him to the emergency room now.     Jaynee Eagles, PA-C 06/25/22 1606

## 2022-07-01 ENCOUNTER — Encounter: Payer: Self-pay | Admitting: Family Medicine

## 2022-07-01 ENCOUNTER — Ambulatory Visit (INDEPENDENT_AMBULATORY_CARE_PROVIDER_SITE_OTHER): Payer: 59 | Admitting: Family Medicine

## 2022-07-01 VITALS — BP 104/68 | HR 76 | Ht 71.0 in | Wt 288.8 lb

## 2022-07-01 DIAGNOSIS — K5792 Diverticulitis of intestine, part unspecified, without perforation or abscess without bleeding: Secondary | ICD-10-CM | POA: Diagnosis not present

## 2022-07-01 DIAGNOSIS — R45851 Suicidal ideations: Secondary | ICD-10-CM | POA: Diagnosis not present

## 2022-07-01 NOTE — Patient Instructions (Addendum)
It was wonderful to see you today.  Please bring ALL of your medications with you to every visit.   Today we talked about:  I am sending a referral to the Gastroenterologist. They will reach out to you for an appointment.  Complete your antibiotics.  I would recommend increasing the fiber in your diet.   If you are feeling suicidal or depression symptoms worsen please immediately go to:   If you are thinking about harming yourself or having thoughts of suicide, or if you know someone who is, seek help right away. If you are in crisis, make sure you are not left alone.  If someone else is in crisis, make sure he/she/they is not left alone  Call 988 OR 1-800-273-TALK TEXT "HOME" to [phone]  24 Hour Availability for [place]  [address] [place], [place] [phone] Crisis [phone]    Other crisis resources:  [facility] [employer] (Domestic Violence, Rape & Victim Assistance [phone]  [facility]    (ONLY from 8am-4pm)    [phone]  [facility] (24/7)   [phone]  [place] National Suicide Hotline   [phone] Diamantina Monks)   Thank you for coming to your visit as scheduled. We have had a large "no-show" problem lately, and this significantly limits our ability to see and care for patients. As a friendly reminder- if you cannot make your appointment please call to cancel. We do have a no show policy for those who do not cancel within 24 hours. Our policy is that if you miss or fail to cancel an appointment within 24 hours, 3 times in a 45-monthperiod, you may be dismissed from our clinic.   Thank you for choosing [facility]   Please call [phone] any questions about today's appointment.  Please be sure to schedule follow up at the front  desk before you leave today.   ASharion Settler DO PGY-3 Family  Medicine

## 2022-07-01 NOTE — Progress Notes (Signed)
    SUBJECTIVE:   CHIEF COMPLAINT / HPI:   Marc Long is a 47 y.o. male who presents to the [facility] clinic today to discuss the following concerns:   Diverticulitis F/U Patient was seen in the ED on 1/10 for abdominal pain.  He had a CT abdomen/pelvis which showed signs of acute diverticulitis without signs of perforation or abscess.  He is not having any fevers, vomiting, did not have a significant leukocytosis.  He was discharged with a prescription for metronidazole and Ceftin as well as norco to take PRN for pain control.   States that his pain is at a 1 currently. He has 5 more days of his antibiotics left. He ran out of Norco but pain is relieved without it. He is using heating pads as needed for any discomfort. He has been taking colace for constipation. Still has not had a bowel movement in several days. Mother is with him today and wondering if it is safe for him to start on fiber supplements.  Since being diagnosed with diverticulitis he has changed his diet to things that are "easy". Mother was wondering if he needs to make any more adjustments to his diet.   PERTINENT  PMH / PSH: Schizophrenia, severe bipolar 1, severe recurrent major depression, type 2 diabetes, CKD stage IIIb  OBJECTIVE:   BP 104/68   Pulse 76   Ht '5\' 11"'$  (1.803 m)   Wt 288 lb 12.8 oz (131 kg)   SpO2 98%   BMI 40.28 kg/m    General: NAD, pleasant, able to participate in exam Cardiac: RRR, no murmurs. Respiratory: CTAB, normal effort Abdomen: Obese abdomen, bowel sounds present, nontender in all quadrants, no R/G  Skin: warm and dry, no rashes noted Psych: Flat affect and depressed mood     07/01/2022    3:30 PM 05/13/2022   11:32 AM 05/06/2022    2:43 PM  Depression screen PHQ 2/9  Decreased Interest '3 3 3  '$ Down, Depressed, Hopeless '3 3 3  '$ PHQ - 2 Score '6 6 6  '$ Altered sleeping '3 3 3  '$ Tired, decreased energy '3 3 3  '$ Change in appetite '3 3 3  '$ Feeling bad or failure about yourself  '3 3 3   '$ Trouble concentrating 0 0 0  Moving slowly or fidgety/restless 0 0 0  Suicidal thoughts '3 3 3  '$ PHQ-9 Score '21 21 21  '$ Difficult doing work/chores Very difficult      ASSESSMENT/PLAN:   1. Diverticulitis Stable. Pain is much improved. No abdominal ttp on examination today. Tolerating abx without adverse effects - Ambulatory referral to Gastroenterology for colonoscopy - Complete abx - Heating pad or Tylenol PRN pain control - Increase fiber intake and water intake - Miralax and senna as needed to help with constipation   2. Passive suicidal ideations Chronic for this patient. He is followed by Psychiatry and counseling (though on a break from this at the moment). He is very well supported by his family and his close friend/roommate. His protective factors are his family and niece/nephew. He denies any active plans for self harm. He has the suicide hotline information memorized [phone]) but information also provided on AVS today. Continue mediations prescribed my Psychiatry.    Sharion Settler, [place]

## 2022-07-10 ENCOUNTER — Encounter: Payer: Self-pay | Admitting: Gastroenterology

## 2022-07-22 ENCOUNTER — Other Ambulatory Visit: Payer: Self-pay | Admitting: Family Medicine

## 2022-07-22 ENCOUNTER — Encounter: Payer: 59 | Admitting: Family Medicine

## 2022-07-24 ENCOUNTER — Ambulatory Visit (INDEPENDENT_AMBULATORY_CARE_PROVIDER_SITE_OTHER): Payer: 59 | Admitting: Family Medicine

## 2022-07-24 ENCOUNTER — Encounter: Payer: Self-pay | Admitting: Family Medicine

## 2022-07-24 VITALS — BP 96/60 | HR 96 | Ht 71.0 in | Wt 288.0 lb

## 2022-07-24 DIAGNOSIS — E118 Type 2 diabetes mellitus with unspecified complications: Secondary | ICD-10-CM

## 2022-07-24 DIAGNOSIS — K5904 Chronic idiopathic constipation: Secondary | ICD-10-CM

## 2022-07-24 DIAGNOSIS — E119 Type 2 diabetes mellitus without complications: Secondary | ICD-10-CM | POA: Diagnosis not present

## 2022-07-24 DIAGNOSIS — N529 Male erectile dysfunction, unspecified: Secondary | ICD-10-CM | POA: Diagnosis not present

## 2022-07-24 DIAGNOSIS — Z13220 Encounter for screening for lipoid disorders: Secondary | ICD-10-CM | POA: Diagnosis not present

## 2022-07-24 DIAGNOSIS — Z794 Long term (current) use of insulin: Secondary | ICD-10-CM

## 2022-07-24 DIAGNOSIS — F200 Paranoid schizophrenia: Secondary | ICD-10-CM | POA: Diagnosis not present

## 2022-07-24 DIAGNOSIS — R45851 Suicidal ideations: Secondary | ICD-10-CM

## 2022-07-24 DIAGNOSIS — I959 Hypotension, unspecified: Secondary | ICD-10-CM

## 2022-07-24 DIAGNOSIS — N1832 Chronic kidney disease, stage 3b: Secondary | ICD-10-CM

## 2022-07-24 LAB — POCT GLYCOSYLATED HEMOGLOBIN (HGB A1C): HbA1c, POC (prediabetic range): 6 % (ref 5.7–6.4)

## 2022-07-24 NOTE — Assessment & Plan Note (Signed)
Suspect poor hydration as well as overuse of fiber supplements, plus chronic idiopathic component. - Bowel regimen with 1 senna docusate daily and 1-1/2 capful of MiraLAX titrated to have 1 bowel movement every other day - Encouraged increasing water intake

## 2022-07-24 NOTE — Assessment & Plan Note (Addendum)
Hemoglobin A1c 6.0 today, unchanged from November.  Diabetes is well-controlled at this time and no medication changes need to be made.  Continues Ozempic, Jardiance, metformin. - Urine protein creatinine ratio - Lipid panel - Will need follow-up for diabetic foot exam and verification of yearly ophthalmology exam

## 2022-07-24 NOTE — Assessment & Plan Note (Signed)
Suspect that patient's multiple psychiatric medications are contributing significantly to this condition.  Recommended patient speak with psychiatry about titrating medications or switching regimen appropriately. - Check testosterone level - Follow-up at next visits and discussed possibly starting Cialis

## 2022-07-24 NOTE — Assessment & Plan Note (Signed)
Passive SI with no active plan, does have PHQ-9 of 24 with positive question 9.  Is able to challenge intrusive thoughts.  Unchanged from baseline and patient has excellent social support.  Following with psychiatry, but has no current therapist. - Referral to psychiatry at [facility] for therapy

## 2022-07-24 NOTE — Assessment & Plan Note (Signed)
Unclear source though suspect multifactorial etiology including poor hydration as well as medications and weight loss.  Given the patient is asymptomatic, no need to change medication regimen at this time. - Continue to monitor and will consider decreasing Ozempic at next visit if still hypotensive - Encouraged proactive p.o. hydration - Strict return precautions in the event of symptomatic hypotension and dizziness

## 2022-07-24 NOTE — Patient Instructions (Signed)
It was great to see you today! Thank you for choosing [facility] for your primary care.  Today we addressed: Kidney disease and diabetes: Your A1c looks great at 6 today! We are checking your lipids and urine protein/creatinine ratio. Psychiatry and therapy: It is our priority to get you a psychiatrist. We are putting in a [place] referral to help you get a therapist. Stay with your current psychiatrist and ACT team or now. Constipation: Please take 1 senna and 1/2 to 1 cap full of Miralax. Go up or down on the amount of Miralax you take until you have one soft bowel movement about every other day. Low blood pressure: Please drink more water daily.  Also please be careful standing from sitting position. IF you begin to get dizzy or lightheaded, please go to the ED or schedule an urgent appointment. We will follow up on this issue the next time you come in. Erectile dysfunction: We suspect this is due to your psychiatric medications. It is important to discuss with your psychiatrist. We will check a testosterone level today. We will bring you back for a follow up to discuss possible Cialis once you have adjusted any psych meds that need to be changed.  You should return to our clinic in 1 month to follow up on these issues and make sure you get connected with a therapist.  Thank you for coming to see [place] at [place] and for the opportunity to care for you! Kahli Mayon, Medical Student 07/24/2022, 4:22 PM  ____________________________________________________  Make sure to check out at the front desk before you leave today.  Please arrive at least 15 minutes prior to your scheduled appointments.  If you haven't already, please set up MyChart to have easy access to your labs results, and communication with your primary care physician.  If you had blood work today, you will get a MyChart message or a letter if results are normal. Otherwise, you will get a call from  [place].  If you had a referral placed, they will call you to set up an appointment. Please give [place] a call if you don't hear back in the next 2 weeks.  If you need additional refills before your next appointment, please call your pharmacy first.

## 2022-07-24 NOTE — Assessment & Plan Note (Signed)
Creatinine stable at last check in January.  Follows with [place] [facility]. - Urine protein creatinine ratio

## 2022-07-24 NOTE — Progress Notes (Signed)
SUBJECTIVE:   CHIEF COMPLAINT / HPI:  Chief Complaint  Patient presents with   Annual Exam    Marc Long is a 47 y.o. male with a past medical history of T2DM, CKD 3b, HLD, obesity, and constipation as well as BPD1, schizophrenia, severe MDD, and passive SI presenting to the clinic for an annual physical.  Bipolar disorder type 1 Schizophrenia Major depression, recurrent Passive suicidal ideation Patient endorses passive SI, states he would not act on it.  SI is triggered by intrusive thoughts and he is able to push back on them.  These thoughts and SI are unchanged from baseline for this patient.  His mother states he has an excellent support system between her and brother.  He has added Invega to his psych regimen since last seen at this clinic. Does follow with psych and has an ACT team, but reports that his psychiatry practice has been unable to provide a therapist for the past 3 months and inquires about a referral elsewhere.  Erectile dysfunction At the end of the visit, brings up new concern of ED, unclear duration.  Asks about checking testosterone levels and prescribing Cialis.  Because of this issue screening the patient significant distress and can discuss at further visits.  Hypotension Hypotensive to 84/66 at home last week, but was completely asymptomatic.  At the time, he drank some extra fluids and BP subsequently recovered. Remains asymptomatic today, denies dizziness, weakness, LOC. Does take lisinopril 2.5 mg, Jardiance 10 mg daily, and Ozempic 2 mg injecton weekly. Thinks he may not be hydrating amazingly well and when provided 2 large cups of water (~16 ounces total) in clinic, rapidly drank them.  Constipation Had not had a bowel movement in 5 days until he had one today.  Was having some abdominal pain and distention prior.  Took senna and had large BM today, felt better immediately.  Does have history of constipation. Has no regular BM plan, takes 15  fiber pills per day. No melena, no blood in stool normally. Does note that he vomited spontaneously on Monday, unclear etiology.  Vomitus nonbloody nonbilious.  CKD stage 3b   T2DM Last A1c was 6.0 on 04/03/22. A1c today was 6.0. His urination frequency and pattern have been regular and unchanged. Denies increased thirst, paraesthesias, and vision changes. Fasting CBGs run 80s to 120s at home in the mornings.  PERTINENT  PMH / PSH: - Psych: BPD1, schizophrenia, severe MDD, Hx passive SI - T2DM, CKD 3b, HLD, obesity, HTN, constipation - Lives alone but mother and brother are very close by and provide very good social support.  Patient Care Team: Shary Key, DO as PCP - General (Family Medicine) Shaune Pollack, MD as Referring Physician (Child and Adolescent Psychiatry)  OBJECTIVE:   BP 96/60   Pulse 96   Ht 5' 11"$  (1.803 m)   Wt 288 lb (130.6 kg)   SpO2 98%   BMI 40.17 kg/m   General: Age-appropriate, resting comfortably in chair, NAD, alert and at baseline.  Accompanied by mother. HEENT:  Head: Normocephalic, atraumatic. Eyes: PERRLA. No conjunctival erythema or scleral injections. Mouth/Oral: Clear, no tonsillar exudate. MMM. Neck: Supple. No LAD, thyroid not palpable. Cardiovascular: Regular rate and rhythm. Normal S1/S2. No murmurs, rubs, or gallops appreciated. 2+ radial pulses bilaterally. Pulmonary: Clear bilaterally to ascultation. No increased WOB, no accessory muscle usage. No wheezes, rales, or crackles. Abdominal: Normoactive bowel sounds. No tenderness to deep or light palpation. No rebound or guarding. No HSM.  Skin: Warm and dry. Extremities: No peripheral edema bilaterally. Psych: Flat affect, psychomotor slowing. Good judgement and insight. Does endorse passive SI due to intrusive thoughts, states he is able to push back on them and would not act on them.  Results for orders placed or performed in visit on 07/24/22 (from the past 48 hour(s))  POCT  A1C     Status: None   Collection Time: 07/24/22  3:26 PM  Result Value Ref Range   Hemoglobin A1C     HbA1c POC (<> result, manual entry)     HbA1c, POC (prediabetic range) 6.0 5.7 - 6.4 %   HbA1c, POC (controlled diabetic range)         07/24/2022    3:20 PM  Depression screen PHQ 2/9  Decreased Interest 3  Down, Depressed, Hopeless 3  PHQ - 2 Score 6  Altered sleeping 3  Tired, decreased energy 3  Change in appetite 3  Feeling bad or failure about yourself  3  Trouble concentrating 0  Moving slowly or fidgety/restless 3  Suicidal thoughts 3  PHQ-9 Score 24   {Show previous vital signs (optional):23777}    ASSESSMENT/PLAN:   Type 2 diabetes mellitus with complication, with long-term current use of insulin (HCC) Hemoglobin A1c 6.0 today, unchanged from November.  Diabetes is well-controlled at this time and no medication changes need to be made.  Continues Ozempic, Jardiance, metformin. - Urine protein creatinine ratio - Lipid panel - Will need follow-up for diabetic foot exam and verification of yearly ophthalmology exam  Stage 3b chronic kidney disease ([place]) Creatinine stable at last check in January.  Follows with [place] [facility]. - Urine protein creatinine ratio  Erectile dysfunction Suspect that patient's multiple psychiatric medications are contributing significantly to this condition.  Recommended patient speak with psychiatry about titrating medications or switching regimen appropriately. - Check testosterone level - Follow-up at next visits and discussed possibly starting Cialis  Suicidal ideations Passive SI with no active plan, does have PHQ-9 of 24 with positive question 9.  Is able to challenge intrusive thoughts.  Unchanged from baseline and patient has excellent social support.  Following with psychiatry, but has no current therapist. - Referral to psychiatry at [facility] for therapy  Constipation Suspect poor hydration as well as overuse of fiber  supplements, plus chronic idiopathic component. - Bowel regimen with 1 senna docusate daily and 1-1/2 capful of MiraLAX titrated to have 1 bowel movement every other day - Encouraged increasing water intake  Hypotension Unclear source though suspect multifactorial etiology including poor hydration as well as medications and weight loss.  Given the patient is asymptomatic, no need to change medication regimen at this time. - Continue to monitor and will consider decreasing Ozempic at next visit if still hypotensive - Encouraged proactive p.o. hydration - Strict return precautions in the event of symptomatic hypotension and dizziness  Return in about 1 month (around 08/22/2022) for f/u hypotension, therapy, erectile dysfunction.  Evoleht Hovatter Mining engineer, [place]   I was personally present and performed or re-performed the history, physical exam and medical decision making activities of this service and have verified that the service and findings are accurately documented in the student's note.  Shary Key, DO                  07/24/2022, 9:38 PM

## 2022-07-26 LAB — MICROALBUMIN / CREATININE URINE RATIO
Creatinine, Urine: 231.8 mg/dL
Microalb/Creat Ratio: 22 mg/g creat (ref 0–29)
Microalbumin, Urine: 51.2 ug/mL

## 2022-07-26 LAB — LIPID PANEL
Chol/HDL Ratio: 3.2 ratio (ref 0.0–5.0)
Cholesterol, Total: 92 mg/dL — ABNORMAL LOW (ref 100–199)
HDL: 29 mg/dL — ABNORMAL LOW (ref >39)
LDL Chol Calc (NIH): 42 mg/dL (ref 0–99)
Triglycerides: 110 mg/dL (ref 0–149)
VLDL Cholesterol Cal: 21 mg/dL (ref 5–40)

## 2022-07-26 LAB — TESTOSTERONE: Testosterone: 410 ng/dL (ref 264–916)

## 2022-08-01 ENCOUNTER — Ambulatory Visit (INDEPENDENT_AMBULATORY_CARE_PROVIDER_SITE_OTHER): Payer: 59 | Admitting: Gastroenterology

## 2022-08-01 ENCOUNTER — Encounter: Payer: Self-pay | Admitting: Gastroenterology

## 2022-08-01 VITALS — BP 112/76 | HR 89 | Ht 71.0 in | Wt 287.0 lb

## 2022-08-01 DIAGNOSIS — K5732 Diverticulitis of large intestine without perforation or abscess without bleeding: Secondary | ICD-10-CM | POA: Insufficient documentation

## 2022-08-01 NOTE — Progress Notes (Signed)
08/01/2022 Wa Rumpf [id number] 1975/11/03   HISTORY OF PRESENT ILLNESS: This is a 47 year old male who is known to Dr. [name] for colonoscopy in 2017.  He is here today for follow-up of a recent ER visit.  Was seen in the ED on 06/25/2022.  Found to have diverticulitis on CT scan as below.  Was given a dose of Rocephin and Flagyl IV.  Then was discharged on Flagyl and Ceftin.  He completed his antibiotics.  He is feeling well with no further/ongoing complaints or issues.  Says that he has been moving his bowels well recently.  No abdominal pain.  His mom is with him at his visit today.  CT scan abdomen and pelvis with contrast 06/25/22:  IMPRESSION: 1. Findings consistent with acute diverticulitis involving the mid and distal descending colon. No perforation or abscess. 2. Hepatic steatosis. 3. Gallstone.  Colonoscopy April 2017 at which time he was found to have only diverticulosis.  Repeat will be due April 2027.   Past Medical History:  Diagnosis Date   Depression    Diabetes mellitus due to underlying condition with unspecified complications ([place]) [id number]   Diabetes mellitus without complication ([place])    DM (diabetes mellitus), type 2, uncontrolled 12/12/2016   Gallstones    H/O: HTN (hypertension) 07/18/2021   Hyperglycemia due to type 2 diabetes mellitus ([place]) 12/12/2016   Obesity, Class III, BMI 40-49.9 (morbid obesity) ([place])    History reviewed. No pertinent surgical history.  reports that he has never smoked. He has never been exposed to tobacco smoke. He has never used smokeless tobacco. He reports that he does not currently use alcohol. He reports that he does not use drugs. family history includes Cancer in his maternal aunt; Depression in his mother; Diabetes in his maternal aunt; Heart attack in his maternal grandfather; Hypertension in his mother; Lymphoma in his maternal grandmother and mother. Allergies  Allergen Reactions   Fish Allergy Nausea Only    Bee Venom Swelling   Keflex [Cephalexin]     UPSET STOMACH   Relafen [Nabumetone]     Blood in stool   Shellfish Allergy Nausea Only      Outpatient Encounter Medications as of 08/01/2022  Medication Sig   atorvastatin (LIPITOR) 40 MG tablet TAKE ONE TABLET BY MOUTH DAILY   Blood Pressure Monitoring (COMFORT TOUCH BP CUFF/LARGE) MISC 1 each by Does not apply route once a week.   buPROPion (WELLBUTRIN XL) 300 MG 24 hr tablet Take 1 tablet (300 mg total) by mouth daily.   fluvoxaMINE (LUVOX) 50 MG tablet Take 3 tablets (150 mg total) by mouth at bedtime.   glucose blood (ACCU-CHEK GUIDE) test strip Please use to check blood sugar once daily.   hydrOXYzine (ATARAX/VISTARIL) 50 MG tablet Take 1 tablet (50 mg total) by mouth every 6 (six) hours as needed for anxiety.   Insulin Pen Needle (BD PEN NEEDLE NANO 2ND GEN) 32G X 4 MM MISC Please use to inject insulin daily.   JARDIANCE 10 MG TABS tablet TAKE ONE TABLET BY MOUTH DAILY   LANTUS SOLOSTAR 100 UNIT/ML Solostar Pen ADMINISTER 27 UNITS UNDER THE SKIN DAILY AT BEDTIME   lisinopril (ZESTRIL) 2.5 MG tablet Take 2.5 mg by mouth daily.   metFORMIN (GLUCOPHAGE) 1000 MG tablet Take 1 tablet (1,000 mg total) by mouth daily with breakfast.   Multiple Vitamin (MULTIVITAMIN WITH MINERALS) TABS tablet Take 1 tablet by mouth daily.   paliperidone (INVEGA) 6 MG 24 hr tablet Take  6 mg by mouth 2 (two) times daily.   REXULTI 2 MG TABS tablet Take 2 mg by mouth daily.   Semaglutide, 2 MG/DOSE, (OZEMPIC, 2 MG/DOSE,) 8 MG/3ML SOPN INJECT TWO MG INTO THE SKIN ONCE A WEEK   aspirin EC 81 MG tablet Take 81 mg by mouth daily. (Patient not taking: Reported on 08/01/2022)   cyclobenzaprine (FLEXERIL) 10 MG tablet Take 1 tablet (10 mg total) by mouth at bedtime. (Patient not taking: Reported on 04/03/2022)   diclofenac Sodium (VOLTAREN) 1 % GEL Apply 4 g topically as needed (Shoulder pain). (Patient not taking: Reported on 04/03/2022)   No facility-administered  encounter medications on file as of 08/01/2022.     REVIEW OF SYSTEMS  : All other systems reviewed and negative except where noted in the History of Present Illness.   PHYSICAL EXAM: BP 112/76   Pulse 89   Ht 5' 11"$  (1.803 m)   Wt 287 lb (130.2 kg)   BMI 40.03 kg/m  General: Well developed AA male in no acute distress Head: Normocephalic and atraumatic Eyes:  Sclerae anicteric, conjunctiva pink. Ears: Normal auditory acuity Lungs: Clear throughout to auscultation; no W/R/R. Heart: Regular rate and rhythm; no M/R/G. Abdomen: Soft, non-distended.  BS present.  Non-tender. Musculoskeletal: Symmetrical with no gross deformities  Skin: No lesions on visible extremities Extremities: No edema  Neurological: Alert oriented x 4, grossly non-focal Psychological:  Alert and cooperative. Normal mood and affect  ASSESSMENT AND PLAN: *Diverticulitis: Had diverticulitis findings on CT scan.  Improved/resolved with Flagyl and Ceftin.  Discussed drinking lots of fluid, eating lots of fiber, keeping his bowels moving well.  Advised to to contact our office or his PCPs office for any further episodes.   CC:  Shary Key, DO

## 2022-08-01 NOTE — Patient Instructions (Signed)
Colon due- 09/2025  Follow up or call our office if you're having any issues.  It was a pleasure to see you today!  Thank you for trusting me with your gastrointestinal care!

## 2022-08-14 ENCOUNTER — Ambulatory Visit (HOSPITAL_COMMUNITY): Payer: 59 | Admitting: Clinical

## 2022-08-22 ENCOUNTER — Telehealth: Payer: Self-pay | Admitting: Family Medicine

## 2022-08-22 ENCOUNTER — Other Ambulatory Visit: Payer: Self-pay

## 2022-08-22 ENCOUNTER — Encounter: Payer: Self-pay | Admitting: Family Medicine

## 2022-08-22 ENCOUNTER — Ambulatory Visit (INDEPENDENT_AMBULATORY_CARE_PROVIDER_SITE_OTHER): Payer: 59 | Admitting: Family Medicine

## 2022-08-22 VITALS — BP 119/84 | HR 81 | Ht 71.0 in | Wt 285.9 lb

## 2022-08-22 DIAGNOSIS — F314 Bipolar disorder, current episode depressed, severe, without psychotic features: Secondary | ICD-10-CM | POA: Diagnosis not present

## 2022-08-22 DIAGNOSIS — R109 Unspecified abdominal pain: Secondary | ICD-10-CM | POA: Diagnosis not present

## 2022-08-22 MED ORDER — LIDOCAINE 4 % EX PTCH: 1.0000 | MEDICATED_PATCH | CUTANEOUS | 0 refills | Status: DC

## 2022-08-22 NOTE — Progress Notes (Unsigned)
    SUBJECTIVE:   CHIEF COMPLAINT / HPI:   Patient presents with his mom for left sided flank pain and psych follow up.   In process of moving to another act team and once he gets that he will have another therapist. Should be in the next week. Has not asked about medications worsening ED. Waiting for insurance approval bfore that happens  Medications affecting quality of life- making him feel more zombie like  Interested in [facility] therapy - has appointment to go to [facility]. 6 week treatment once daily  Had ECT in the past worked well but he couldn't complete it because it was so expensive   He endorses pain in his left side . Started with a stomach ache about 2 days ago, Then side started hurting yesteray, intermittent sharp pain when he moves. No issues urinating, or with bowle movements. Not worsened with food. Tried tyelnol without relief. Did state he was doing some lifting prior to when the pain startd   PERTINENT  PMH / PSH: Reviewed   OBJECTIVE:   BP 119/84   Pulse 81   Ht 5\' 11"  (1.803 m)   Wt 285 lb 15 oz (129.7 kg)   SpO2 98%   BMI 39.88 kg/m    Physical exam General: well appearing, NAD Cardiovascular: RRR, no murmurs Lungs: CTAB. Normal WOB Abdomen: soft, non-distended, non-tender Skin: warm, dry. No edema MSK: L flank area without erythema or edema. No tenderness to palpation. There is some pain when he leans to his right side. Normal ROM and strength   ASSESSMENT/PLAN:   Flank pain Patient presents with 2 days of L sided flank pain worse with movement. Afebrile. Vitals stable. Physical exam benign. Did have some pain when he leaned to the right. Normal ROM and strength. No urinary or GI issues so low suspicion for kidney stones, Pyelo, diverticular disease. Suspect MSK etiology. Continue Tylenol, stretches. Ordered lidocaine patch. Discussed return precautions. Will monitor and do further work up if pain persists.    Severe bipolar I disorder, most recent  episode depressed (HCC) PHQ-9 score of 24, positive SI.  This is unchanged from previous visits.  No current active plan.  Protective factors include family support from his mom.  He follows with psychiatry, currently in the process of changing ACT team's.  He will likely need adjustment with his medications given they are affecting his quality of life and making him feel bad. Has appointment to go to [facility] for [place] therapy. Will continue to monitor      [place]

## 2022-08-22 NOTE — Telephone Encounter (Signed)
Contacted Marc Long to schedule their annual wellness visit. Appointment made for 09/01/2022.  Thank you,  [place] Direct dial  [phone]

## 2022-08-22 NOTE — Patient Instructions (Signed)
It was great seeing you today!  Your side pain I think is likely a muscle strain, continue using Tylenol and try the lidocaine patch over the area.  You can also alternate between ice and heat.  Please give me a call if he still having pain after about 2 weeks and we can evaluate further.  Please check-out at the front desk before leaving the clinic. I'd like to see you back in May for diabetes follow-up and follow-up  after you see psychiatry, but if you need to be seen earlier than that for any new issues we're happy to fit you in, just give [place] a call!  Visit Reminders: - Stop by the pharmacy to pick up your prescriptions  - Continue to work on your healthy eating habits and incorporating exercise into your daily life.    Feel free to call with any questions or concerns at any time, at [phone].   Take care,  Dr. Shary Key [facility]

## 2022-08-25 DIAGNOSIS — R109 Unspecified abdominal pain: Secondary | ICD-10-CM | POA: Insufficient documentation

## 2022-08-25 NOTE — Assessment & Plan Note (Signed)
PHQ-9 score of 24, positive SI.  This is unchanged from previous visits.  No current active plan.  Protective factors include family support from his mom.  He follows with psychiatry, currently in the process of changing ACT team's.  He will likely need adjustment with his medications given they are affecting his quality of life and making him feel bad. Has appointment to go to [facility] for [place] therapy. Will continue to monitor

## 2022-08-25 NOTE — Assessment & Plan Note (Signed)
Patient presents with 2 days of L sided flank pain worse with movement. Afebrile. Vitals stable. Physical exam benign. Did have some pain when he leaned to the right. Normal ROM and strength. No urinary or GI issues so low suspicion for kidney stones, Pyelo, diverticular disease. Suspect MSK etiology. Continue Tylenol, stretches. Ordered lidocaine patch. Discussed return precautions. Will monitor and do further work up if pain persists.

## 2022-09-01 ENCOUNTER — Ambulatory Visit (INDEPENDENT_AMBULATORY_CARE_PROVIDER_SITE_OTHER): Payer: 59 | Admitting: Family Medicine

## 2022-09-01 DIAGNOSIS — Z0000 Encounter for general adult medical examination without abnormal findings: Secondary | ICD-10-CM | POA: Diagnosis not present

## 2022-09-01 NOTE — Progress Notes (Signed)
Subjective:   Marc Long is a 47 y.o. male who presents for an Initial Medicare Annual Wellness Visit.  I connected with  Silva Bandy on 09/01/22 by a telephone enabled telemedicine application and verified that I am speaking with the correct person using two identifiers.   I discussed the limitations of evaluation and management by telemedicine. The patient expressed understanding and agreed to proceed.  Patient location: home  Provider location:   Home- Telephone  I provided  minutes of non face - to - face time during this encounter.   Review of Systems     Cardiac Risk Factors include: advanced age (>60men, >42 women);male gender;obesity (BMI >30kg/m2);sedentary lifestyle     Objective:    Today's Vitals   There is no height or weight on file to calculate BMI.     09/01/2022   12:53 PM 08/22/2022    3:43 PM 07/24/2022    3:20 PM 06/25/2022    4:41 PM 05/13/2022   11:33 AM 05/06/2022    2:41 PM 04/10/2022    1:41 PM  Advanced Directives  Does Patient Have a Medical Advance Directive? No No No No No No No  Would patient like information on creating a medical advance directive? No - Patient declined No - Patient declined No - Patient declined No - Patient declined No - Patient declined No - Patient declined No - Patient declined    Current Medications (verified) Outpatient Encounter Medications as of 09/01/2022  Medication Sig   atorvastatin (LIPITOR) 40 MG tablet TAKE ONE TABLET BY MOUTH DAILY   Blood Pressure Monitoring (COMFORT TOUCH BP CUFF/LARGE) MISC 1 each by Does not apply route once a week.   buPROPion (WELLBUTRIN XL) 300 MG 24 hr tablet Take 1 tablet (300 mg total) by mouth daily.   fluvoxaMINE (LUVOX) 50 MG tablet Take 3 tablets (150 mg total) by mouth at bedtime.   glucose blood (ACCU-CHEK GUIDE) test strip Please use to check blood sugar once daily.   Insulin Pen Needle (BD PEN NEEDLE NANO 2ND GEN) 32G X 4 MM MISC Please use to inject insulin  daily.   JARDIANCE 10 MG TABS tablet TAKE ONE TABLET BY MOUTH DAILY   LANTUS SOLOSTAR 100 UNIT/ML Solostar Pen ADMINISTER 27 UNITS UNDER THE SKIN DAILY AT BEDTIME   lidocaine (HM LIDOCAINE PATCH) 4 % Place 1 patch onto the skin daily.   lisinopril (ZESTRIL) 2.5 MG tablet Take 2.5 mg by mouth daily.   metFORMIN (GLUCOPHAGE) 1000 MG tablet Take 1 tablet (1,000 mg total) by mouth daily with breakfast.   Multiple Vitamin (MULTIVITAMIN WITH MINERALS) TABS tablet Take 1 tablet by mouth daily.   paliperidone (INVEGA) 6 MG 24 hr tablet Take 6 mg by mouth 2 (two) times daily.   REXULTI 2 MG TABS tablet Take 2 mg by mouth daily.   Semaglutide, 2 MG/DOSE, (OZEMPIC, 2 MG/DOSE,) 8 MG/3ML SOPN INJECT TWO MG INTO THE SKIN ONCE A WEEK   aspirin EC 81 MG tablet Take 81 mg by mouth daily. (Patient not taking: Reported on 08/01/2022)   cyclobenzaprine (FLEXERIL) 10 MG tablet Take 1 tablet (10 mg total) by mouth at bedtime. (Patient not taking: Reported on 04/03/2022)   diclofenac Sodium (VOLTAREN) 1 % GEL Apply 4 g topically as needed (Shoulder pain).   hydrOXYzine (ATARAX/VISTARIL) 50 MG tablet Take 1 tablet (50 mg total) by mouth every 6 (six) hours as needed for anxiety. (Patient not taking: Reported on 09/01/2022)   No facility-administered encounter  medications on file as of 09/01/2022.    Allergies (verified) Fish allergy, Bee venom, Keflex [cephalexin], Relafen [nabumetone], and Shellfish allergy   History: Past Medical History:  Diagnosis Date   Depression    Diabetes mellitus due to underlying condition with unspecified complications ([place]) [id number]   Diabetes mellitus without complication ([place])    DM (diabetes mellitus), type 2, uncontrolled 12/12/2016   Gallstones    H/O: HTN (hypertension) 07/18/2021   Hyperglycemia due to type 2 diabetes mellitus ([place]) 12/12/2016   Obesity, Class III, BMI 40-49.9 (morbid obesity) ([place])    History reviewed. No pertinent surgical history. Family History  Problem  Relation Age of Onset   Hypertension Mother    Depression Mother    Lymphoma Mother    Lymphoma Maternal Grandmother    Heart attack Maternal Grandfather    Diabetes Maternal Aunt    Cancer Maternal Aunt        multi myeloma   Colon cancer Neg Hx    Esophageal cancer Neg Hx    Social History   Socioeconomic History   Marital status: Single    Spouse name: Not on file   Number of children: 0   Years of education: Not on file   Highest education level: Not on file  Occupational History   Occupation: call center  Tobacco Use   Smoking status: Never    Passive exposure: Never   Smokeless tobacco: Never  Vaping Use   Vaping Use: Never used  Substance and Sexual Activity   Alcohol use: Not Currently    Comment: occ   Drug use: No   Sexual activity: Not Currently    Birth control/protection: None  Other Topics Concern   Not on file  Social History Narrative   Not on file   Social Determinants of Health   Financial Resource Strain: Low Risk  (09/01/2022)   Overall Financial Resource Strain (CARDIA)    Difficulty of Paying Living Expenses: Not hard at all  Food Insecurity: No Food Insecurity (09/01/2022)   Hunger Vital Sign    Worried About Running Out of Food in the Last Year: Never true    Ran Out of Food in the Last Year: Never true  Transportation Needs: No Transportation Needs (09/01/2022)   PRAPARE - Hydrologist (Medical): No    Lack of Transportation (Non-Medical): No  Physical Activity: Inactive (09/01/2022)   Exercise Vital Sign    Days of Exercise per Week: 0 days    Minutes of Exercise per Session: 0 min  Stress: No Stress Concern Present (09/01/2022)   [place]    Feeling of Stress : Not at all  Social Connections: Socially Isolated (09/01/2022)   Social Connection and Isolation Panel [NHANES]    Frequency of Communication with Friends and Family: More than three  times a week    Frequency of Social Gatherings with Friends and Family: Once a week    Attends Religious Services: Never    Marine scientist or Organizations: No    Attends Archivist Meetings: Never    Marital Status: Never married    Tobacco Counseling Counseling given: Not Answered   Clinical Intake:                 Diabetic?  Yes Nutrition Risk Assessment:  Has the patient had any N/V/D within the last 2 months?  No  Does the patient have any non-healing  wounds?  No  Has the patient had any unintentional weight loss or weight gain?  No   Diabetes:  Is the patient diabetic?  Yes  If diabetic, was a CBG obtained today?  No  Did the patient bring in their glucometer from home?  No  How often do you monitor your CBG's? 1 x a day.   Financial Strains and Diabetes Management:  Are you having any financial strains with the device, your supplies or your medication? No .  Does the patient want to be seen by Chronic Care Management for management of their diabetes?  No  Would the patient like to be referred to a Nutritionist or for Diabetic Management?  No   Diabetic Exams:  Diabetic Eye Exam: . Overdue for diabetic eye exam. Pt has been advised about the importance in completing this exam. A referral has been placed today. Message sent to referral coordinator for scheduling purposes. Advised pt to expect a call from office referred to regarding appt.  Diabetic Foot Exam: . Pt has been advised about the importance in completing this exam. Pt is scheduled for diabetic foot exam on .           Activities of Daily Living    09/01/2022   12:46 PM  In your present state of health, do you have any difficulty performing the following activities:  Hearing? 0  Difficulty concentrating or making decisions? 0  Walking or climbing stairs? 0  Dressing or bathing? 0  Doing errands, shopping? 0  Preparing Food and eating ? N  Using the Toilet? N  In the  past six months, have you accidently leaked urine? N  Do you have problems with loss of bowel control? N  Managing your Medications? N  Managing your Finances? N  Housekeeping or managing your Housekeeping? N    Patient Care Team: Shary Key, DO as PCP - General (Family Medicine) Jeanette Caprice Governor Specking, MD as Referring Physician (Child and Adolescent Psychiatry)  Indicate any recent Medical Services you may have received from other than [facility] providers in the past year (date may be approximate).     Assessment:   This is a routine wellness examination for [place].  Hearing/Vision screen Hearing Screening - Comments:: No trouble hearing Vision Screening - Comments:: Not up to date  Dietary issues and exercise activities discussed: Current Exercise Habits: The patient does not participate in regular exercise at present   Goals Addressed             This Visit's Progress    Weight (lb) < 200 lb (90.7 kg)         Depression Screen    09/01/2022   12:42 PM 08/22/2022    3:43 PM 07/24/2022    3:20 PM 07/01/2022    3:30 PM 05/13/2022   11:32 AM 05/06/2022    2:43 PM 04/10/2022    1:41 PM  PHQ 2/9 Scores  PHQ - 2 Score 6 6 6 6 6 6 5   PHQ- 9 Score 17 24 24 21 21 21 20     Fall Risk    09/01/2022   12:36 PM 08/22/2022    3:43 PM 01/31/2022    1:54 PM 12/19/2021    2:58 PM 11/19/2021    2:36 PM  [place] in the past year? 0 0 0 0 0  Number falls in past yr: 0 0 0 0 0  Injury with Fall? 0 0 0 0 0  FALL RISK PREVENTION PERTAINING TO THE HOME:  Any stairs in or around the home? No  If so, are there any without handrails? No  Home free of loose throw rugs in walkways, pet beds, electrical cords, etc? Yes  Adequate lighting in your home to reduce risk of falls? Yes   ASSISTIVE DEVICES UTILIZED TO PREVENT FALLS:  Life alert? No  Use of a cane, walker or w/c? No  Grab bars in the bathroom? No  Shower chair or bench in shower? No  Elevated toilet seat or a handicapped  toilet? No   TIMED UP AND GO:  Was the test performed? No .      Cognitive Function:    12/25/2017    9:44 AM 12/18/2017    8:51 AM  MMSE - Mini Mental State Exam  Orientation to time    Orientation to Place    Registration    Attention/ Calculation    Recall    Language- name 2 objects    Language- repeat    Language- follow 3 step command    Language- read & follow direction    Write a sentence    Copy design    Total score       Information is confidential and restricted. Go to Review Flowsheets to unlock data.        09/01/2022   12:35 PM  6CIT Screen  What Year? 0 points  What month? 0 points  What time? 0 points  Count back from 20 0 points  Months in reverse 0 points  Repeat phrase 0 points  Total Score 0 points    Immunizations Immunization History  Administered Date(s) Administered   COVID-19, mRNA, vaccine(Comirnaty)12 years and older 05/13/2022   Influenza,inj,Quad PF,6+ Mos 04/07/2018, 04/01/2021, 04/03/2022   Moderna SARS-COV2 Booster Vaccination 09/20/2019   Moderna Sars-Covid-2 Vaccination 10/18/2019   PFIZER(Purple Top)SARS-COV-2 Vaccination 06/11/2020   PNEUMOCOCCAL CONJUGATE-20 02/01/2021   Pfizer Covid-19 Vaccine Bivalent Booster 65yrs & up 04/18/2021   Pneumococcal Polysaccharide-23 12/06/2017   Tdap 02/21/2016    TDAP status: Up to date  Flu Vaccine status: Up to date  Pneumococcal vaccine status: Up to date  Covid-19 vaccine status: Information provided on how to obtain vaccines.   Qualifies for Shingles Vaccine? No   Zostavax completed No   Does not qualify  Screening Tests Health Maintenance  Topic Date Due   OPHTHALMOLOGY EXAM  08/27/2017   FOOT EXAM  12/11/2017   HEMOGLOBIN A1C  01/22/2023   Diabetic kidney evaluation - eGFR measurement  06/26/2023   Diabetic kidney evaluation - Urine ACR  07/25/2023   Medicare Annual Wellness (AWV)  09/01/2023   COLONOSCOPY (Pts 45-65yrs Insurance coverage will need to be confirmed)   10/07/2025   DTaP/Tdap/Td (2 - Td or Tdap) 02/20/2026   INFLUENZA VACCINE  Completed   COVID-19 Vaccine  Completed   Hepatitis C Screening  Completed   HIV Screening  Completed   HPV VACCINES  Aged Out    Health Maintenance  Health Maintenance Due  Topic Date Due   OPHTHALMOLOGY EXAM  08/27/2017   FOOT EXAM  12/11/2017    Colorectal cancer screening: Type of screening: Colonoscopy. Completed 2017. Repeat every 10 years  Lung Cancer Screening: (Low Dose CT Chest recommended if Age 81-80 years, 30 pack-year currently smoking OR have quit w/in 15years.) does not qualify.   Lung Cancer Screening Referral:   Additional Screening:  Hepatitis C Screening: does not qualify; Completed 2022  Vision Screening: Recommended annual  ophthalmology exams for early detection of glaucoma and other disorders of the eye. Is the patient up to date with their annual eye exam?  No  Who is the provider or what is the name of the office in which the patient attends annual eye exams? Patient will call to schedule Eye Exam.  Discussed importance  If pt is not established with a provider, would they like to be referred to a provider to establish care? No .   Dental Screening: Recommended annual dental exams for proper oral hygiene  Community Resource Referral / Chronic Care Management: CRR required this visit?  No   CCM required this visit?  No      Plan:     I have personally reviewed and noted the following in the patient's chart:   Medical and social history Use of alcohol, tobacco or illicit drugs  Current medications and supplements including opioid prescriptions. Patient is not currently taking opioid prescriptions. Functional ability and status Nutritional status Physical activity Advanced directives List of other physicians Hospitalizations, surgeries, and ER visits in previous 12 months Vitals Screenings to include cognitive, depression, and falls Referrals and  appointments  In addition, I have reviewed and discussed with patient certain preventive protocols, quality metrics, and best practice recommendations. A written personalized care plan for preventive services as well as general preventive health recommendations were provided to patient.     Leroy Kennedy, LPN   [id number]   Nurse Notes:   Patient is High Risk Depression ongoing,  did see his new Psychiatrist 1 week ago.      Has ongoing Suicidal Ideations.     No plan in place / has had thoughts in past two weeks discussed with MD.    Doing well today.

## 2022-09-01 NOTE — Patient Instructions (Signed)
[name] , Thank you for taking time to come for your Medicare Wellness Visit. I appreciate your ongoing commitment to your health goals. Please review the following plan we discussed and let me know if I can assist you in the future.   Screening recommendations/referrals: Colonoscopy: up to date Recommended yearly ophthalmology/optometry visit for glaucoma screening and checkup Recommended yearly dental visit for hygiene and checkup  Vaccinations: Influenza vaccine: up to date Pneumococcal vaccine: up to date Tdap vaccine: up to date     Advanced directives: Education provided  Conditions/risks identified:     Preventive Care 40-64 Years, Male Preventive care refers to lifestyle choices and visits with your health care provider that can promote health and wellness. What does preventive care include? A yearly physical exam. This is also called an annual well check. Dental exams once or twice a year. Routine eye exams. Ask your health care provider how often you should have your eyes checked. Personal lifestyle choices, including: Daily care of your teeth and gums. Regular physical activity. Eating a healthy diet. Avoiding tobacco and drug use. Limiting alcohol use. Practicing safe sex. Taking low-dose aspirin every day starting at age 10. What happens during an annual well check? The services and screenings done by your health care provider during your annual well check will depend on your age, overall health, lifestyle risk factors, and family history of disease. Counseling  Your health care provider may ask you questions about your: Alcohol use. Tobacco use. Drug use. Emotional well-being. Home and relationship well-being. Sexual activity. Eating habits. Work and work Statistician. Screening  You may have the following tests or measurements: Height, weight, and BMI. Blood pressure. Lipid and cholesterol levels. These may be checked every 5 years, or more  frequently if you are over 14 years old. Skin check. Lung cancer screening. You may have this screening every year starting at age 77 if you have a 30-pack-year history of smoking and currently smoke or have quit within the past 15 years. Fecal occult blood test (FOBT) of the stool. You may have this test every year starting at age 18. Flexible sigmoidoscopy or colonoscopy. You may have a sigmoidoscopy every 5 years or a colonoscopy every 10 years starting at age 33. Prostate cancer screening. Recommendations will vary depending on your family history and other risks. Hepatitis C blood test. Hepatitis B blood test. Sexually transmitted disease (STD) testing. Diabetes screening. This is done by checking your blood sugar (glucose) after you have not eaten for a while (fasting). You may have this done every 1-3 years. Discuss your test results, treatment options, and if necessary, the need for more tests with your health care provider. Vaccines  Your health care provider may recommend certain vaccines, such as: Influenza vaccine. This is recommended every year. Tetanus, diphtheria, and acellular pertussis (Tdap, Td) vaccine. You may need a Td booster every 10 years. Zoster vaccine. You may need this after age 62. Pneumococcal 13-valent conjugate (PCV13) vaccine. You may need this if you have certain conditions and have not been vaccinated. Pneumococcal polysaccharide (PPSV23) vaccine. You may need one or two doses if you smoke cigarettes or if you have certain conditions. Talk to your health care provider about which screenings and vaccines you need and how often you need them. This information is not intended to replace advice given to you by your health care provider. Make sure you discuss any questions you have with your health care provider. Document Released: 06/29/2015 Document Revised: 02/20/2016 Document Reviewed:  04/03/2015 Elsevier Interactive Patient Education  2017 [place] Prevention in the Home Falls can cause injuries. They can happen to people of all ages. There are many things you can do to make your home safe and to help prevent falls. What can I do on the outside of my home? Regularly fix the edges of walkways and driveways and fix any cracks. Remove anything that might make you trip as you walk through a door, such as a raised step or threshold. Trim any bushes or trees on the path to your home. Use bright outdoor lighting. Clear any walking paths of anything that might make someone trip, such as rocks or tools. Regularly check to see if handrails are loose or broken. Make sure that both sides of any steps have handrails. Any raised decks and porches should have guardrails on the edges. Have any leaves, snow, or ice cleared regularly. Use sand or salt on walking paths during winter. Clean up any spills in your garage right away. This includes oil or grease spills. What can I do in the bathroom? Use night lights. Install grab bars by the toilet and in the tub and shower. Do not use towel bars as grab bars. Use non-skid mats or decals in the tub or shower. If you need to sit down in the shower, use a plastic, non-slip stool. Keep the floor dry. Clean up any water that spills on the floor as soon as it happens. Remove soap buildup in the tub or shower regularly. Attach bath mats securely with double-sided non-slip rug tape. Do not have throw rugs and other things on the floor that can make you trip. What can I do in the bedroom? Use night lights. Make sure that you have a light by your bed that is easy to reach. Do not use any sheets or blankets that are too big for your bed. They should not hang down onto the floor. Have a firm chair that has side arms. You can use this for support while you get dressed. Do not have throw rugs and other things on the floor that can make you trip. What can I do in the kitchen? Clean up any spills right  away. Avoid walking on wet floors. Keep items that you use a lot in easy-to-reach places. If you need to reach something above you, use a strong step stool that has a grab bar. Keep electrical cords out of the way. Do not use floor polish or wax that makes floors slippery. If you must use wax, use non-skid floor wax. Do not have throw rugs and other things on the floor that can make you trip. What can I do with my stairs? Do not leave any items on the stairs. Make sure that there are handrails on both sides of the stairs and use them. Fix handrails that are broken or loose. Make sure that handrails are as long as the stairways. Check any carpeting to make sure that it is firmly attached to the stairs. Fix any carpet that is loose or worn. Avoid having throw rugs at the top or bottom of the stairs. If you do have throw rugs, attach them to the floor with carpet tape. Make sure that you have a light switch at the top of the stairs and the bottom of the stairs. If you do not have them, ask someone to add them for you. What else can I do to help prevent falls? Wear shoes that: Do not  have high heels. Have rubber bottoms. Are comfortable and fit you well. Are closed at the toe. Do not wear sandals. If you use a stepladder: Make sure that it is fully opened. Do not climb a closed stepladder. Make sure that both sides of the stepladder are locked into place. Ask someone to hold it for you, if possible. Clearly mark and make sure that you can see: Any grab bars or handrails. First and last steps. Where the edge of each step is. Use tools that help you move around (mobility aids) if they are needed. These include: Canes. Walkers. Scooters. Crutches. Turn on the lights when you go into a dark area. Replace any light bulbs as soon as they burn out. Set up your furniture so you have a clear path. Avoid moving your furniture around. If any of your floors are uneven, fix them. If there are any  pets around you, be aware of where they are. Review your medicines with your doctor. Some medicines can make you feel dizzy. This can increase your chance of falling. Ask your doctor what other things that you can do to help prevent falls. This information is not intended to replace advice given to you by your health care provider. Make sure you discuss any questions you have with your health care provider. Document Released: 03/29/2009 Document Revised: 11/08/2015 Document Reviewed: 07/07/2014 Elsevier Interactive Patient Education  2017 [employer].

## 2022-09-11 ENCOUNTER — Other Ambulatory Visit: Payer: Self-pay | Admitting: Family Medicine

## 2022-09-12 ENCOUNTER — Ambulatory Visit (HOSPITAL_COMMUNITY): Payer: 59 | Admitting: Clinical

## 2022-09-17 ENCOUNTER — Ambulatory Visit (HOSPITAL_COMMUNITY): Payer: 59 | Admitting: Clinical

## 2022-09-30 NOTE — Progress Notes (Signed)
Reviewed and agree with documentation and assessment and plan. K. Veena Myrka Sylva , MD   

## 2022-10-13 ENCOUNTER — Other Ambulatory Visit: Payer: Self-pay | Admitting: Family Medicine

## 2022-10-21 ENCOUNTER — Encounter: Payer: Self-pay | Admitting: Family Medicine

## 2022-10-21 ENCOUNTER — Other Ambulatory Visit: Payer: Self-pay

## 2022-10-21 ENCOUNTER — Ambulatory Visit (INDEPENDENT_AMBULATORY_CARE_PROVIDER_SITE_OTHER): Payer: 59 | Admitting: Family Medicine

## 2022-10-21 VITALS — BP 114/80 | HR 82 | Ht 71.0 in | Wt 279.2 lb

## 2022-10-21 DIAGNOSIS — Z794 Long term (current) use of insulin: Secondary | ICD-10-CM

## 2022-10-21 DIAGNOSIS — R1032 Left lower quadrant pain: Secondary | ICD-10-CM

## 2022-10-21 DIAGNOSIS — E118 Type 2 diabetes mellitus with unspecified complications: Secondary | ICD-10-CM

## 2022-10-21 DIAGNOSIS — N529 Male erectile dysfunction, unspecified: Secondary | ICD-10-CM

## 2022-10-21 LAB — POCT GLYCOSYLATED HEMOGLOBIN (HGB A1C): HbA1c, POC (controlled diabetic range): 5.6 % (ref 0.0–7.0)

## 2022-10-21 MED ORDER — SILDENAFIL CITRATE 25 MG PO TABS: 25.0000 mg | ORAL_TABLET | Freq: Every day | ORAL | 0 refills | Status: AC | PRN

## 2022-10-21 NOTE — Assessment & Plan Note (Signed)
No contraindications- can tolerate walking 2 flights of stairs. Will start with Viagra 25mg  prn before intercourse.

## 2022-10-21 NOTE — Progress Notes (Signed)
    SUBJECTIVE:   CHIEF COMPLAINT / HPI:   Patient presents for diabetes follow up   Currently on Lantus 15 units daily, Jardiance 10 mg daily, Metformin 1000mg  BID.  Checks sugars daily and has been under 100 for the past few weeks. Lowest was 67 once.  Denies abdominal pain, urinary concerns. Denies nausea, dizziness   Still endorses Left side pain which started about 2 months ago and discussed at last visit. Went away and then return. Was advised to use OTC meds, lidocaine patch prn, and do stretches. States its a dull pain has been going on for 2 days. Not worse with eating. Deep breathing makes it worse. Hasn't tried any medication. Denies fever, diarrhea, constipation.   Has a new psychiatrist, Refilled Invega today.   Requests Cialis Rx for ED   PERTINENT  PMH / PSH: Reviewed   OBJECTIVE:   BP 114/80   Pulse 82   Ht 5\' 11"  (1.803 m)   Wt 279 lb 3.2 oz (126.6 kg)   SpO2 98%   BMI 38.94 kg/m    Physical exam General: well appearing, NAD Cardiovascular: RRR, no murmurs Lungs: CTAB. Normal WOB Abdomen: soft, non-distended, mildly  TTP in LLQ without rebound tenderness or guarding.  Skin: warm, dry. No edema  ASSESSMENT/PLAN:   Type 2 diabetes mellitus with complication, with long-term current use of insulin (HCC) A1C 6.0 three months ago.  Currently on Lantus 15 units daily, Jardiance 10 mg daily, Metformin 1000mg  daily. Will decrease Lantus to 5 units daily and recommended checking sugars and bringing log into visit in 2 weeks.   Left lower quadrant pain Dull LLQ pain 2 days in duration without GI symptoms. Mild tenderness to palpation on exam without rebound tenderness or guarding. Do not suspect acute abdomen given benign exam. No concern for diverticulitis given no other symptoms. Recommended daily Miralax and discussed return precautions.  Erectile dysfunction No contraindications- can tolerate walking 2 flights of stairs. Will start with Viagra 25mg  prn before  intercourse.     Cora Collum, DO [facility]

## 2022-10-21 NOTE — Assessment & Plan Note (Signed)
A1C 6.0 three months ago.  Currently on Lantus 15 units daily, Jardiance 10 mg daily, Metformin 1000mg  daily. Will decrease Lantus to 5 units daily and recommended checking sugars and bringing log into visit in 2 weeks.

## 2022-10-21 NOTE — Assessment & Plan Note (Signed)
Dull LLQ pain 2 days in duration without GI symptoms. Mild tenderness to palpation on exam without rebound tenderness or guarding. Do not suspect acute abdomen given benign exam. No concern for diverticulitis given no other symptoms. Recommended daily Miralax and discussed return precautions.

## 2022-10-21 NOTE — Patient Instructions (Addendum)
It was great seeing you today!  For your diabetes since your A1c is very well controlled we can try decreasing your Lantus to 5 units. Check your sugar at home over the next couple of weeks and if well controlled we can stay on that dose and maybe take you off insulin at your next A1c check in 3 months.   For your ED I have prescribed Viagra 25mg  to use at least 30 min before intercourse. I have attached more information about medication.  For your stomach pain I recommend trying Miralax 1 cap daily, can increase dose until you get a soft stool daily. If pain persists beyond the next couple of weeks, worsens, you develop fever or other symptoms return to the clinic.  Schedule to return in 2 weeks for medication follow up. Please bring your sugar log in with you   Feel free to call with any questions or concerns at any time, at [phone].   Take care,  Dr. Cora Collum [facility]  Tadalafil Tablets (Erectile Dysfunction, BPH) What is this medication? TADALAFIL (tah DA la fil) treats erectile dysfunction (ED). It works by increasing blood flow to the penis, which helps to maintain an erection. It may also be used to treat symptoms of an enlarged prostate (benign prostatic hyperplasia). This medicine may be used for other purposes; ask your health care provider or pharmacist if you have questions. COMMON BRAND NAME(S): Mady Gemma, Cialis What should I tell my care team before I take this medication? They need to know if you have any of these conditions: Anatomical deformation of the penis, Peyronie's disease, or history of priapism (painful and prolonged erection) Bleeding disorders Eye or vision problems, including a rare inherited eye disease called retinitis pigmentosa Heart disease, angina, a history of heart attack, irregular heart beats, or other heart problems High or low blood pressure History of blood diseases, like sickle cell anemia or  leukemia History of stomach bleeding Kidney disease Liver disease Stroke An unusual or allergic reaction to tadalafil, other medications, foods, dyes, or preservatives Pregnant or trying to get pregnant Breast-feeding How should I use this medication? Take this medication by mouth with a glass of water. Follow the directions on the prescription label. You may take this medication with or without meals. When this medication is used for erection problems, your care team may prescribe it to be taken once daily or as needed. If you are taking the medication as needed, you may be able to have sexual activity 30 minutes after taking it and for up to 36 hours after taking it. Whether you are taking the medication as needed or once daily, you should not take more than one dose per day. If you are taking this medication for symptoms of benign prostatic hyperplasia (BPH) or to treat both BPH and an erection problem, take the dose once daily at about the same time each day. Do not take your medication more often than directed. Talk to your care team about the use of this medication in children. Special care may be needed. Overdosage: If you think you have taken too much of this medicine contact a poison control center or emergency room at once. NOTE: This medicine is only for you. Do not share this medicine with others. What if I miss a dose? If you are taking this medication as needed for erection problems, this does not apply. If you miss a dose while taking this medication once daily for an  erection problem, benign prostatic hyperplasia, or both, take it as soon as you remember, but do not take more than one dose per day. What may interact with this medication? Do not take this medication with any of the following: Nitrates like amyl nitrite, isosorbide dinitrate, isosorbide mononitrate, nitroglycerin Other medications for erectile dysfunction like avanafil, sildenafil, vardenafil Other tadalafil products  (Adcirca) Riociguat This medication may also interact with the following: Certain medications for high blood pressure Certain medications for the treatment of HIV infection or AIDS Certain medications used for fungal or yeast infections, like fluconazole, itraconazole, ketoconazole, and voriconazole Certain medications used for seizures like carbamazepine, phenytoin, and phenobarbital Grapefruit juice Macrolide antibiotics like clarithromycin, erythromycin, troleandomycin Medications for prostate problems Rifabutin, rifampin or rifapentine This list may not describe all possible interactions. Give your health care provider a list of all the medicines, herbs, non-prescription drugs, or dietary supplements you use. Also tell them if you smoke, drink alcohol, or use illegal drugs. Some items may interact with your medicine. What should I watch for while using this medication? If you notice any changes in your vision while taking this medication, call your care team as soon as possible. Stop using this medication and call your care team right away if you have a loss of sight in one or both eyes. Contact your care team right away if the erection lasts longer than 4 hours or if it becomes painful. This may be a sign of serious problem and must be treated right away to prevent permanent damage. If you experience symptoms of nausea, dizziness, chest pain or arm pain upon initiation of sexual activity after taking this medication, you should refrain from further activity and call your care team as soon as possible. Do not drink alcohol to excess (examples, 5 glasses of wine or 5 shots of whiskey) when taking this medication. When taken in excess, alcohol can increase your chances of getting a headache or getting dizzy, increasing your heart rate or lowering your blood pressure. Using this medication does not protect you or your partner against HIV infection (the virus that causes AIDS) or other sexually  transmitted diseases. What side effects may I notice from receiving this medication? Side effects that you should report to your care team as soon as possible: Allergic reactions--skin rash, itching, hives, swelling of the face, lips, tongue, or throat Hearing loss or ringing in ears Heart attack--pain or tightness in the chest, shoulders, arms, or jaw, nausea, shortness of breath, cold or clammy skin, feeling faint or lightheaded Low blood pressure--dizziness, feeling faint or lightheaded, blurry vision Prolonged or painful erection Redness, blistering, peeling, or loosening of the skin, including inside the mouth Stroke--sudden numbness or weakness of the face, arm, or leg, trouble speaking, confusion, trouble walking, loss of balance or coordination, dizziness, severe headache, change in vision Sudden vision loss in one or both eyes Side effects that usually do not require medical attention (report to your care team if they continue or are bothersome): Back pain Facial flushing or redness Headache Muscle pain Runny or stuffy nose Upset stomach This list may not describe all possible side effects. Call your doctor for medical advice about side effects. You may report side effects to FDA at 1-800-FDA-1088. Where should I keep my medication? Keep out of the reach of children. Store at room temperature between 15 and 30 degrees C (59 and 86 degrees F). Throw away any unused medication after the expiration date. NOTE: This sheet is a summary. It may  not cover all possible information. If you have questions about this medicine, talk to your doctor, pharmacist, or health care provider.  2023 Elsevier/Gold Standard (2007-07-24 00:00:00)

## 2022-11-03 ENCOUNTER — Ambulatory Visit (INDEPENDENT_AMBULATORY_CARE_PROVIDER_SITE_OTHER): Payer: 59 | Admitting: Family Medicine

## 2022-11-03 ENCOUNTER — Encounter: Payer: Self-pay | Admitting: Family Medicine

## 2022-11-03 VITALS — BP 108/77 | HR 83 | Ht 71.0 in | Wt 273.6 lb

## 2022-11-03 DIAGNOSIS — E118 Type 2 diabetes mellitus with unspecified complications: Secondary | ICD-10-CM

## 2022-11-03 DIAGNOSIS — M25562 Pain in left knee: Secondary | ICD-10-CM

## 2022-11-03 DIAGNOSIS — Z794 Long term (current) use of insulin: Secondary | ICD-10-CM | POA: Diagnosis not present

## 2022-11-03 DIAGNOSIS — R110 Nausea: Secondary | ICD-10-CM | POA: Diagnosis not present

## 2022-11-03 MED ORDER — ONDANSETRON HCL 4 MG PO TABS: 4.0000 mg | ORAL_TABLET | Freq: Three times a day (TID) | ORAL | 0 refills | Status: AC | PRN

## 2022-11-03 NOTE — Assessment & Plan Note (Signed)
Currently on Lantus 5 units daily, Jardiance 10 mg daily, Ozempic 2mg  weekly, Metformin 1000mg  BID. Given his home fasting CBGs have been in a good range (Averaging in the 80s) will come off insulin completely and continue Jardiance and Ozempic

## 2022-11-03 NOTE — Assessment & Plan Note (Signed)
Started 3 days ago and has now resolved. Tolerating po well. Vitals stable. Possibly from Ozempic or from something he consumed. Sent in Zofran to use as needed when this happens again. Return precautions discussed.

## 2022-11-03 NOTE — Assessment & Plan Note (Signed)
Occurring for the past 1-2 months without inciting injury. Primarily occurs when kneeling or completely flexing knee. Normal physical exam today without swelling, erythema or tenderness. Discussed using voltaren gel, tylenol arthritis, ice, and stretching/exercising daily.

## 2022-11-03 NOTE — Patient Instructions (Signed)
It was great seeing you today!  Since your sugars have still been in good range you can stop taking the insulin! Continue the Metformin and Ozempic.  For nausea I have sent in Zofran to use every 8 hours as needed. Let me know if the nausea worsens or is persistent.   For your knee I recommend icing it when it flares up and getting Voltaren gel over the counter. You can also use Tylenol for pain. Doing exercises and stretches daily will be helpful as well.   We will see you back in about 2-3 months for your next A1c but if you need to be seen earlier than that for any new issues we're happy to fit you in, just give [place] a call!  Feel free to call with any questions or concerns at any time, at [phone].   Take care,  Dr. Cora Collum [facility]

## 2022-11-03 NOTE — Progress Notes (Signed)
    SUBJECTIVE:   CHIEF COMPLAINT / HPI:   Patient presents with mother for diabetes follow up and nausea. States he has been nauseous since Friday. Has had some emesis, last on Saturday. No stomach pain. Not currently nauseous anymore. Has happened intermittently in the past. Also feels he has to clear out his throat. Denies fever or chills. Denies diarrhea, constipation. Last Ozempic dose on Thursday.   Also endorses let knee pain for the past month and a half. No injury. Only hurts when he has to get on his knee or bend it. Has not tried to take anything for it. Not exercising.   At last visit A1c was 5.6. Lantus was decreased from 15 units to 5 units and was told to follow up and bring his sugar logs to see if further titration is neeced. Sugars have averaged in the 70-80s.    PERTINENT  PMH / PSH: Reviewed   OBJECTIVE:   BP 108/77   Pulse 83   Ht 5\' 11"  (1.803 m)   Wt 273 lb 9.6 oz (124.1 kg)   SpO2 96%   BMI 38.16 kg/m    General: alert, NAD CV: RRR no murmurs Resp: CTAB normal WOB GI: soft, non distended  L Knee: - Inspection: no gross deformity. No swelling/effusion, erythema or bruising. Skin intact - Palpation: no TTP  - ROM: full active ROM with flexion and extension - Strength: 5/5 strength  - Neuro/vasc: NV intact distally b/l - Special Tests: - LIGAMENTS: negative anterior and posterior drawer, negative Lachman's, no MCL or LCL laxity  -- MENISCUS: negative McMurray's, negative Thessaly  -- PF JOINT: nml patellar mobility    ASSESSMENT/PLAN:   Type 2 diabetes mellitus with complication, with long-term current use of insulin (HCC) Currently on Lantus 5 units daily, Jardiance 10 mg daily, Ozempic 2mg  weekly, Metformin 1000mg  BID. Given his home fasting CBGs have been in a good range (Averaging in the 80s) will come off insulin completely and continue Jardiance and Ozempic  Nausea Started 3 days ago and has now resolved. Tolerating po well. Vitals stable.  Possibly from Ozempic or from something he consumed. Sent in Zofran to use as needed when this happens again. Return precautions discussed.   Knee pain, left Occurring for the past 1-2 months without inciting injury. Primarily occurs when kneeling or completely flexing knee. Normal physical exam today without swelling, erythema or tenderness. Discussed using voltaren gel, tylenol arthritis, ice, and stretching/exercising daily.    Cora Collum, DO [facility]

## 2022-11-11 ENCOUNTER — Other Ambulatory Visit: Payer: Self-pay | Admitting: Family Medicine

## 2022-12-08 ENCOUNTER — Other Ambulatory Visit: Payer: Self-pay | Admitting: Family Medicine

## 2023-01-08 ENCOUNTER — Ambulatory Visit (INDEPENDENT_AMBULATORY_CARE_PROVIDER_SITE_OTHER): Payer: 59 | Admitting: Student

## 2023-01-08 VITALS — BP 102/78 | HR 78 | Ht 71.0 in | Wt 267.2 lb

## 2023-01-08 DIAGNOSIS — Z794 Long term (current) use of insulin: Secondary | ICD-10-CM

## 2023-01-08 DIAGNOSIS — R109 Unspecified abdominal pain: Secondary | ICD-10-CM | POA: Insufficient documentation

## 2023-01-08 DIAGNOSIS — R1032 Left lower quadrant pain: Secondary | ICD-10-CM | POA: Diagnosis not present

## 2023-01-08 DIAGNOSIS — R45851 Suicidal ideations: Secondary | ICD-10-CM

## 2023-01-08 DIAGNOSIS — E118 Type 2 diabetes mellitus with unspecified complications: Secondary | ICD-10-CM

## 2023-01-08 LAB — GLUCOSE, POCT (MANUAL RESULT ENTRY): POC Glucose: 117 mg/dl — AB (ref 70–99)

## 2023-01-08 MED ORDER — METRONIDAZOLE 500 MG PO TABS: 500.0000 mg | ORAL_TABLET | Freq: Three times a day (TID) | ORAL | 0 refills | Status: AC

## 2023-01-08 MED ORDER — CIPROFLOXACIN HCL 750 MG PO TABS: 750.0000 mg | ORAL_TABLET | Freq: Two times a day (BID) | ORAL | 0 refills | Status: DC

## 2023-01-08 MED ORDER — METRONIDAZOLE 500 MG PO TABS: 500.0000 mg | ORAL_TABLET | Freq: Three times a day (TID) | ORAL | 0 refills | Status: DC

## 2023-01-08 MED ORDER — CIPROFLOXACIN HCL 750 MG PO TABS: 750.0000 mg | ORAL_TABLET | Freq: Two times a day (BID) | ORAL | 0 refills | Status: AC

## 2023-01-08 NOTE — Progress Notes (Signed)
    SUBJECTIVE:   CHIEF COMPLAINT / HPI:   Marc Long is a 47 year old male with history of diverticulitis, chronic idiopathic constipation, T2DM on Ozempic here today to discuss abdominal pain.  He follows with [place] gastro[facility] for history of diverticulitis of the colon (most recently 06/25/2022).  Per my chart review, he has had DKA in 2018 and 2019 requiring hospital admission.  His A1c has been very well-controlled in which they are able to discontinue Lantus.  He reports that his pain started 2 to 3 days ago in the left side.  He denies any nausea or vomiting.  The pain has been constant and starting to become extremely uncomfortable, rated as a 7/10.  No diarrhea. He has been taking Tylenol and Advil with little to no relief. Says he had an elevated temperature of 99.0 Fahrenheit at home. Did have a little bit of a cough last week but otherwise no runny nose or other sick symptoms. Denies any dysuria, hematuria, blood in the stool.  He checks his CBGs at home and they have been in the 80s-90s.  Had a little bit of diarrhea. No nausea or vomiting. The pain was a little bit in the middle. The pain has been constant. Has been taking tylenol and advil with no relief. No fevers at home- 99.0 F. Has a cough that started last week. No chills or runny nose. Did have a runny nose for a day.    PERTINENT  PMH / PSH: Stage IIIb CKD, T2DM, history of suicidality, history of DKA, bipolar disorder  OBJECTIVE:   BP 102/78   Pulse 78   Ht 5\' 11"  (1.803 m)   Wt 267 lb 4 oz (121.2 kg)   SpO2 99%   BMI 37.27 kg/m   General: Pleasant, tired but generally well-appearing Cardiac: Regular rate and rhythm Respiratory: Work of breathing is not labored, lungs are clear Abdomen: Tender in the left lower left middle and upper quadrants with guarding.  No rigidity or signs of peritonitis.  Bowel sounds are normal and active Skin: Warm and dry  ASSESSMENT/PLAN:   Abdominal  pain Left-sided abdominal pain for 2 to 3 days.  Signs and symptoms consistent with diverticulitis. Abdominal exam overall reassuring, with some guarding when palpating the left sided abdomen.  He appears well-hydrated and vital signs are within normal limits for age.  Reassuringly,  CBG 112 in clinic today, no concern for DKA.  Considered UTI, pyelonephritis, and viral gastroenteritis, pancreatitis. - Given he is hemodynamically stable, generally well-appearing will plan for outpatient treatment with ciprofloxacin and metronidazole for 10 days. -Discussed general diverticulitis care instructions -Should he develop worsening pain, fever, intractable nausea or vomiting he is to seek more urgent medical care. -CBC and lipase  Suicidal ideations Chronically suicidal.  Passive with no active plan, question #9 positive. Unchanged from his baseline, mom is present at visit today and he has wonderful support with her.     Darral Dash, DO [facility]

## 2023-01-08 NOTE — Assessment & Plan Note (Signed)
Left-sided abdominal pain for 2 to 3 days.  Signs and symptoms consistent with diverticulitis. Abdominal exam overall reassuring, with some guarding when palpating the left sided abdomen.  He appears well-hydrated and vital signs are within normal limits for age.  Reassuringly,  CBG 112 in clinic today, no concern for DKA.  Considered UTI, pyelonephritis, and viral gastroenteritis, pancreatitis. - Given he is hemodynamically stable, generally well-appearing will plan for outpatient treatment with ciprofloxacin and metronidazole for 10 days. -Discussed general diverticulitis care instructions -Should he develop worsening pain, fever, intractable nausea or vomiting he is to seek more urgent medical care. -CBC and lipase

## 2023-01-08 NOTE — Assessment & Plan Note (Addendum)
Chronically suicidal.  Passive with no active plan, question #9 positive. Unchanged from his baseline, mom is present at visit today and he has wonderful support with her. He has had a referral placed recently for psychiatry

## 2023-01-08 NOTE — Patient Instructions (Addendum)
It was great seeing you today.  As we discussed, -Your abdominal pain could be due to many different things including stomach bug, other viral illness, inflammation.  We will treat for diverticulitis with 2 different antibiotics.  Take them as prescribed for 10 days. -Please seek urgent care if your abdominal pain worsens or does not improve, you develop intractable vomiting, not able to tolerate oral fluidsIt was great seeing you today.  We will plan to see you again next year unless you have any other concerns.   If you have any questions or concerns, please feel free to call the clinic.    Be well,  Dr. Darral Dash [facility] [phone]    If you have any questions or concerns, please feel free to call the clinic.   Have a wonderful day,  Dr. Darral Dash [facility] [phone]

## 2023-01-08 NOTE — Progress Notes (Deleted)
    SUBJECTIVE:   CHIEF COMPLAINT / HPI:   Left side flank pain. Had a little bit of diarrhea. No nausea or vomiting. The pain was a little bit in the middle. The pain has been constant. Has been taking tylenol and advil with no relief. No fevers at home- 99.0 F. Has a cough that started last week. No chills or runny nose. Did have a runny nose for a day.   No longer taking Lantus. He checks his blood sugars at home in the 80s-90s.  No dysuria.   PERTINENT  PMH / PSH: ***  OBJECTIVE:   BP 102/78   Pulse 78   Ht 5\' 11"  (1.803 m)   Wt 267 lb 4 oz (121.2 kg)   SpO2 99%   BMI 37.27 kg/m  ***   ASSESSMENT/PLAN:   No problem-specific Assessment & Plan notes found for this encounter.   No concern for UTI, kidney stones, DKA.  [facility]  Darral Dash, [place] [facility]    {    This will disappear when note is signed, click to select method of visit    :1}

## 2023-01-09 LAB — CBC: Hemoglobin: 14.2 g/dL (ref 13.0–17.7)

## 2023-02-23 ENCOUNTER — Other Ambulatory Visit: Payer: Self-pay | Admitting: *Deleted

## 2023-02-23 DIAGNOSIS — Z794 Long term (current) use of insulin: Secondary | ICD-10-CM

## 2023-02-25 MED ORDER — EMPAGLIFLOZIN 10 MG PO TABS
10.0000 mg | ORAL_TABLET | Freq: Every day | ORAL | 2 refills | Status: DC
Start: 2023-02-25 — End: 2023-10-28

## 2023-03-31 LAB — HM DIABETES EYE EXAM

## 2023-04-01 ENCOUNTER — Other Ambulatory Visit: Payer: Self-pay

## 2023-04-01 DIAGNOSIS — E781 Pure hyperglyceridemia: Secondary | ICD-10-CM

## 2023-04-01 MED ORDER — ATORVASTATIN CALCIUM 40 MG PO TABS
40.0000 mg | ORAL_TABLET | Freq: Every day | ORAL | 3 refills | Status: AC
Start: 2023-04-01 — End: ?

## 2023-04-02 ENCOUNTER — Other Ambulatory Visit: Payer: Self-pay

## 2023-04-03 MED ORDER — OZEMPIC (2 MG/DOSE) 8 MG/3ML ~~LOC~~ SOPN: 2.0000 mg | PEN_INJECTOR | SUBCUTANEOUS | 2 refills | Status: DC

## 2023-04-13 ENCOUNTER — Other Ambulatory Visit: Payer: Self-pay

## 2023-04-13 ENCOUNTER — Encounter: Payer: Self-pay | Admitting: Family Medicine

## 2023-04-13 ENCOUNTER — Ambulatory Visit: Payer: 59 | Admitting: Family Medicine

## 2023-04-13 VITALS — BP 119/85 | HR 79 | Ht 71.0 in | Wt 258.8 lb

## 2023-04-13 DIAGNOSIS — I959 Hypotension, unspecified: Secondary | ICD-10-CM

## 2023-04-13 DIAGNOSIS — Z794 Long term (current) use of insulin: Secondary | ICD-10-CM

## 2023-04-13 DIAGNOSIS — Z23 Encounter for immunization: Secondary | ICD-10-CM | POA: Diagnosis not present

## 2023-04-13 DIAGNOSIS — R45851 Suicidal ideations: Secondary | ICD-10-CM

## 2023-04-13 DIAGNOSIS — E118 Type 2 diabetes mellitus with unspecified complications: Secondary | ICD-10-CM | POA: Diagnosis not present

## 2023-04-13 LAB — POCT GLYCOSYLATED HEMOGLOBIN (HGB A1C): HbA1c, POC (controlled diabetic range): 5.5 % (ref 0.0–7.0)

## 2023-04-13 MED ORDER — SEMAGLUTIDE-WEIGHT MANAGEMENT 2.4 MG/0.75ML ~~LOC~~ SOAJ
2.4000 mg | SUBCUTANEOUS | 1 refills | Status: DC
Start: 2023-04-13 — End: 2023-07-16

## 2023-04-13 NOTE — Progress Notes (Signed)
    SUBJECTIVE:   CHIEF COMPLAINT / HPI:   JH is a 47yo M w/ hx of T2DM, CKD 3b, bipolar 1, schizophrenia, MDD that p/f T2DM management and hypotension after workouts.   Hypotension - After working out, feels like blood pressure gets low.  - Doing weight training with machines at a gym. His roommate drives him. - Feels lightheaded, especially when getting out of car after driving home from gym (roommate drives the car).  - He checked his BP, Lowest was 90/60s and notices HR goes up to 120.  - Sweats a lot when he works out. Tries to stay hydrated and drink water.  - Denies chest pain, SOB, vomiting. - After resting for a few hours, he recovers back to normal.  T2DM  Obesity - Doesn't check sugar regularly - Not currently on insulin - Has been working on diet and exercise. Plans to continue diet and exercise but is interested in increasing GLP1 for help with weight loss. - Denies family hx of endocrine cancer or MEN disorder.  PHQ9 positive question 9 - Denies active suicidality at this time - Sees a therapist and psychiatrist. Reports that he is doing well  OBJECTIVE:   BP 119/85   Pulse 79   Ht 5\' 11"  (1.803 m)   Wt 258 lb 12.8 oz (117.4 kg)   SpO2 99%   BMI 36.10 kg/m   General: Alert, pleasant man. NAD. HEENT: NCAT. MMM. CV: RRR, no murmurs.  Resp: CTAB, no wheezing or crackles. Normal WOB on RA.  Abm: Soft, nontender, nondistended. BS present. Ext: Moves all ext spontaneously Skin: Warm, well perfused   ASSESSMENT/PLAN:   Suicidal ideations Chronically suicidal, under control at this time. Passive suicidality, question 9 PH9 positive. Pt is working with therapist and psychiatrist and reports good control of his symptoms.   Obesity, Class III, BMI 40-49.9 (morbid obesity) (HCC) BMI 36.1. Is working on diet and exercise, and losing weight with semaglutide. Will increase for further weight management benefit.  - Increase semaglutide to 2.4mg   weekly  Hypotension Differential includes dehydration, physical deconditioning, and hypoglycemia. Dehydration is considered given BP gets lower and tachycardia. Physical deconditioning could cause dizzy sensation after exertion. Hypoglycemia is considered although pt is not on insulin. - Advised frequent hydration before and during workouts - Advised to take 5 minute breaks after every of exercise - Advised to check glucose when these episodes occur  Type 2 diabetes mellitus with complication, with long-term current use of insulin (HCC) A1c at goal. - Increase semaglutide as above - Cont Jardiance, metformin - Advised to f.u in 3 months. Check Cr and UACR at that time.   Lincoln Brigham, MD [facility]

## 2023-04-13 NOTE — Patient Instructions (Signed)
Good to see you today - Thank you for coming in  Things we discussed today:  1) Your A1c is 5.5 today. Your diabetes is well controlled! - Increase your Ozempic from 2 to 2.4mg  weekly. This can help with better weight control.   2) For your lightheadedness with working out. - Stay hydrated! Drink a glass of water about an hour before you go workout. Bring a water bottle to stay hydrated while at the gym. - If you feel lightheaded, check your blood sugar.  - If it is less than 70, drink some juice or eat a piece of candy. Then make an appointment to see me. - Take frequent breaks to catch your breath and rest.  Come back to see me in 3 months. At that time we can check your urine and labs.

## 2023-04-14 NOTE — Assessment & Plan Note (Signed)
BMI 36.1. Is working on diet and exercise, and losing weight with semaglutide. Will increase for further weight management benefit.  - Increase semaglutide to 2.4mg  weekly

## 2023-04-14 NOTE — Assessment & Plan Note (Signed)
Differential includes dehydration, physical deconditioning, and hypoglycemia. Dehydration is considered given BP gets lower and tachycardia. Physical deconditioning could cause dizzy sensation after exertion. Hypoglycemia is considered although pt is not on insulin. - Advised frequent hydration before and during workouts - Advised to take 5 minute breaks after every of exercise - Advised to check glucose when these episodes occur

## 2023-04-14 NOTE — Assessment & Plan Note (Signed)
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 Passive suicidality, question 9 PH9 positive. Pt is working with therapist and psychiatrist and reports good control of his symptoms.

## 2023-04-14 NOTE — Assessment & Plan Note (Addendum)
A1c at goal. - Increase semaglutide as above - Cont Jardiance, metformin - Advised to f.u in 3 months. Check Cr and UACR at that time.

## 2023-04-21 ENCOUNTER — Encounter: Payer: Self-pay | Admitting: Family Medicine

## 2023-04-25 ENCOUNTER — Telehealth: Payer: Self-pay

## 2023-04-25 DIAGNOSIS — E118 Type 2 diabetes mellitus with unspecified complications: Secondary | ICD-10-CM

## 2023-04-25 NOTE — Telephone Encounter (Signed)
Pharmacy Patient Advocate Encounter   Received notification from Patient Advice Request messages that prior authorization for [facility] is required/requested.   Insurance verification completed.   The patient is insured through [employer] .   Per test claim: PA required; PA submitted to above mentioned insurance via CoverMyMeds Key/confirmation #/[id number] [facility]. Status is pending

## 2023-04-27 NOTE — Telephone Encounter (Signed)
Patient calls nurse line in regards to [facility].   He reports the medication was denied.   He reports he would like to try Ozempic again.   Will forward to PCP.

## 2023-04-28 MED ORDER — OZEMPIC (2 MG/DOSE) 8 MG/3ML ~~LOC~~ SOPN
2.0000 mg | PEN_INJECTOR | SUBCUTANEOUS | 3 refills | Status: DC
Start: 2023-04-28 — End: 2023-09-30

## 2023-07-16 ENCOUNTER — Ambulatory Visit: Payer: 59 | Admitting: Family Medicine

## 2023-07-16 VITALS — BP 108/76 | HR 93 | Ht 71.0 in | Wt 248.0 lb

## 2023-07-16 DIAGNOSIS — K5732 Diverticulitis of large intestine without perforation or abscess without bleeding: Secondary | ICD-10-CM

## 2023-07-16 DIAGNOSIS — R45851 Suicidal ideations: Secondary | ICD-10-CM

## 2023-07-16 DIAGNOSIS — K5792 Diverticulitis of intestine, part unspecified, without perforation or abscess without bleeding: Secondary | ICD-10-CM | POA: Diagnosis not present

## 2023-07-16 DIAGNOSIS — R221 Localized swelling, mass and lump, neck: Secondary | ICD-10-CM

## 2023-07-16 MED ORDER — METRONIDAZOLE 500 MG PO TABS: 500.0000 mg | ORAL_TABLET | Freq: Three times a day (TID) | ORAL | 0 refills | Status: DC

## 2023-07-16 MED ORDER — CIPROFLOXACIN HCL 500 MG PO TABS: 500.0000 mg | ORAL_TABLET | Freq: Two times a day (BID) | ORAL | 0 refills | Status: DC

## 2023-07-16 NOTE — Patient Instructions (Addendum)
Dear Grant Ruts  Today we discussed the following concerns and plans:  Diverticulitis - I am sending in antibiotics as below - Try to increase your fiber intake and stay well-hydrated  Prescriptions I am sending to your pharmacy: - ciprofloxacin 500mg  - take this medicine 2 times a day for 7 days - metronidazole 500mg  - take this medicine 3 times a day for 7 days  Please visit [email or web address] to look for therapists in the area, or call the phone number on the back of your insurance card to find providers who are in your insurance network.  If you have any concerns, please call the clinic or schedule an appointment.  It was a pleasure to take care of you today. Be well!  Cyndia Skeeters, DO [facility], PGY-1    Diverticulitis  Diverticulitis is when small pouches in your colon get infected or swollen. This causes pain in your belly (abdomen) and watery poop (diarrhea). The small pouches are called diverticula. They may form if you have a condition called diverticulosis. What are the causes? You may get this condition if poop (stool) gets trapped in the pouches in your colon. The poop lets germs (bacteria) grow. This causes an infection. What increases the risk? You are more likely to get this condition if you have small pouches in your colon. You are also more likely to get it if: You are overweight or very overweight (obese). You do not exercise enough. You drink alcohol. You smoke. You eat a lot of red meat, like beef, pork, or lamb. You do not eat enough fiber. You are older than 47 years of age. What are the signs or symptoms? Pain in your belly. Pain is often on the left side, but it may be felt in other spots too. Fever and chills. Feeling like you may vomit. Vomiting. Having cramps. Feeling full. Changes in how often you poop. Blood in your poop. How is this treated? Most cases are treated at home. You may be told to: Take  over-the-counter pain medicines. Only eat and drink clear liquids. Take antibiotics. Rest. Very bad cases may need to be treated at a hospital. Treatment may include: Not eating or drinking. Taking pain medicines. Getting antibiotics through an IV tube. Getting fluid and food through an IV tube. Having surgery. When you are feeling better, you may need to have a test to look at your colon (colonoscopy). Follow these instructions at home: Medicines Take over-the-counter and prescription medicines only as told by your doctor. These include: Fiber pills. Probiotics. Medicines to make your poop soft (stool softeners). If you were prescribed antibiotics, take them as told by your doctor. Do not stop taking them even if you start to feel better. Ask your doctor if you should avoid driving or using machines while you are taking your medicine. Eating and drinking  Follow the diet told by your doctor. You may need to only eat and drink liquids. When you feel better, you may be able to eat more foods. You may also be told to eat a lot of fiber. Fiber helps you poop. Foods with fiber include berries, beans, lentils, and green vegetables. Try not to eat red meat. General instructions Do not smoke or use any products that contain nicotine or tobacco. If you need help quitting, ask your doctor. Exercise 3 or more times a week. Try to go for 30 minutes each time. Exercise enough to sweat and make your heart beat faster. Contact a doctor  if: Your pain gets worse. You are not pooping like normal. Your symptoms do not get better. Your symptoms get worse very fast. You have a fever. You vomit more than one time. You have poop that is: Bloody. Black. Tarry. This information is not intended to replace advice given to you by your health care provider. Make sure you discuss any questions you have with your health care provider. Document Revised: 02/27/2022 Document Reviewed: 02/27/2022 Elsevier  Patient Education  2024 [employer].

## 2023-07-16 NOTE — Progress Notes (Signed)
    SUBJECTIVE:   CHIEF COMPLAINT / HPI:  Presents today with mother who aids in giving history and provides support.  Abdominal Pain Started yesterday morning when patient awoke.  In the left lower quadrant, worse when he breathes in deeply.  No pain anywhere else in his abdomen.  No nausea, vomiting, constipation, diarrhea.  No blood in stool.  No change in bladder habits.  States this feels very similar to episodes of diverticulitis he has had before.  He has had numerous episodes of diverticulitis before, he cannot count how many.  His last episode was this past fall which was treated outpatient with antibiotics successfully.  He did have CT scan January 2024 which confirmed diverticulitis.  Lump on right neck Noticed that this appeared 4 to 6 weeks ago.  Only painful when he puts pressure on it.  Not bothered by chewing food, swallowing.  No warmth to the area.  Has been stable in size since he first noticed it.  No recent illnesses that he is aware of.  No smoking history.   PHQ9 POSITIVE RESPONSE TO QUESTION 9 - Denies active suicidality at this time. Reports that he is doing well. - Was seeing a therapist until they left the practice recently; is looking for another therapist.  PERTINENT  PMH / PSH:  T2DM Obesity HTN Gallstones Diverticulitis  OBJECTIVE:   BP 108/76   Pulse 93   Ht 5\' 11"  (1.803 m)   Wt 248 lb (112.5 kg)   SpO2 99%   BMI 34.59 kg/m   General: Well-appearing, no acute distress. HEENT: normocephalic, PERRLA, MMM.  Golf ball sized, mobile submandibular lymph node on right neck; mildly tender with palpation but without warmth. Cardio: Regular rate, regular rhythm, no murmurs on exam. Pulm: Clear, no wheezing, no crackles. No increased work of breathing. Abdominal: bowel sounds present, soft, non-distended. Mildly tender to palpation of LLQ. No TTP in any other quadrant.   ASSESSMENT/PLAN:   Suicidal ideations Chronically suicidal. Passive with no  active plan, question #9 positive today. This is unchanged from baseline. Mom is present today for support.  Diverticulitis of colon Symptoms and patient history makes diverticulitis very likely.  Abdominal exam overall reassuring, and vital signs within normal limits for age. -Considering he is hemodynamically stable and generally well-appearing, will plan for outpatient treatment with antibiotics as follows - Ciprofloxacin 500 mg twice daily for 7 days - Metronidazole 500 mg 3 times daily for 7 days - Discussed general diverticulitis care instructions, and handout provided - Return precautions given  Mass of right side of neck Consistent with enlarged right-sided submandibular lymph node.  Reassuringly mobile, not interfering with eating or drinking, no smoking history, has not changed in size since appearance 4 to 6 weeks ago.  Suspect likely enlarged due to subclinical viral infection and will resolve on its own in time. - Discussed with patient things that would be concerning such as ongoing fevers without other explanation, further enlargement of the node, increasing pain, warmth to the area, etc. - Plan to monitor for now; return precautions given     Cyndia Skeeters, DO [facility]

## 2023-07-16 NOTE — Assessment & Plan Note (Signed)
Chronically suicidal. Passive with no active plan, question #9 positive today. This is unchanged from baseline. Mom is present today for support.

## 2023-07-16 NOTE — Assessment & Plan Note (Signed)
Consistent with enlarged right-sided submandibular lymph node.  Reassuringly mobile, not interfering with eating or drinking, no smoking history, has not changed in size since appearance 4 to 6 weeks ago.  Suspect likely enlarged due to subclinical viral infection and will resolve on its own in time. - Discussed with patient things that would be concerning such as ongoing fevers without other explanation, further enlargement of the node, increasing pain, warmth to the area, etc. - Plan to monitor for now; return precautions given

## 2023-07-16 NOTE — Assessment & Plan Note (Signed)
Symptoms and patient history makes diverticulitis very likely.  Abdominal exam overall reassuring, and vital signs within normal limits for age. -Considering he is hemodynamically stable and generally well-appearing, will plan for outpatient treatment with antibiotics as follows - Ciprofloxacin 500 mg twice daily for 7 days - Metronidazole 500 mg 3 times daily for 7 days - Discussed general diverticulitis care instructions, and handout provided - Return precautions given

## 2023-07-18 ENCOUNTER — Ambulatory Visit
Admission: EM | Admit: 2023-07-18 | Discharge: 2023-07-18 | Disposition: A | Discharge status: Home / Self Care | Payer: 59 | Attending: Family Medicine | Admitting: Family Medicine

## 2023-07-18 DIAGNOSIS — T783XXA Angioneurotic edema, initial encounter: Secondary | ICD-10-CM

## 2023-07-18 DIAGNOSIS — K5792 Diverticulitis of intestine, part unspecified, without perforation or abscess without bleeding: Secondary | ICD-10-CM | POA: Diagnosis not present

## 2023-07-18 MED ORDER — AMOXICILLIN-POT CLAVULANATE 875-125 MG PO TABS: 1.0000 | ORAL_TABLET | Freq: Two times a day (BID) | ORAL | 0 refills | Status: DC

## 2023-07-18 MED ORDER — PREDNISONE 20 MG PO TABS: ORAL_TABLET | ORAL | 0 refills | Status: DC

## 2023-07-18 NOTE — Discharge Instructions (Addendum)
Stop ciprofloxacin and metronidazole; start amoxicillin-clavulanate for diverticulitis instead. Stop lisinopril. I am adding it to your reactions list. Start prednisone in the event this is a medication reaction.

## 2023-07-18 NOTE — ED Provider Notes (Signed)
[facility]  Note:  This document was prepared using Conservation officer, historic buildings and may include unintentional dictation errors.  MRN: [id number] DOB: 1975-08-22  Subjective:   Sawyer Mentzer is a 48 y.o. male presenting for acute onset of swelling of the upper lip since this morning.  No throat closing sensation, hives, chest tightness, shortness of breath, nausea, vomiting, abdominal pain.  Patient was recently started on ciprofloxacin and metronidazole for a flareup of his diverticulitis.  He is also taking lisinopril 2.5 mg once daily.  Last dosing was last night.  No other new exposures.  No current facility-administered medications for this encounter.  Current Outpatient Medications:    clonazePAM (KLONOPIN) 0.5 MG tablet, Take by mouth., Disp: , Rfl:    diphenhydrAMINE (BENADRYL) 50 MG capsule, Take 50 mg by mouth every 6 (six) hours as needed., Disp: , Rfl:    atorvastatin (LIPITOR) 40 MG tablet, Take 1 tablet (40 mg total) by mouth daily., Disp: 90 tablet, Rfl: 3   Blood Pressure Monitoring (COMFORT TOUCH BP CUFF/LARGE) MISC, 1 each by Does not apply route once a week., Disp: 1 each, Rfl: 0   buPROPion (WELLBUTRIN XL) 300 MG 24 hr tablet, Take 1 tablet (300 mg total) by mouth daily., Disp: 90 tablet, Rfl: 1   ciprofloxacin (CIPRO) 500 MG tablet, Take 1 tablet (500 mg total) by mouth 2 (two) times daily for 7 days., Disp: 14 tablet, Rfl: 0   empagliflozin (JARDIANCE) 10 MG TABS tablet, Take 1 tablet (10 mg total) by mouth daily., Disp: 90 tablet, Rfl: 2   fluvoxaMINE (LUVOX) 100 MG tablet, , Disp: , Rfl:    glucose blood (ACCU-CHEK GUIDE) test strip, Please use to check blood sugar once daily., Disp: 100 each, Rfl: 12   lisinopril (ZESTRIL) 2.5 MG tablet, Take 2.5 mg by mouth daily., Disp: , Rfl:    metFORMIN (GLUCOPHAGE) 1000 MG tablet, TAKE ONE TABLET BY MOUTH TWICE A DAY WITH A MEAL FOR DIABETES, Disp: 180 tablet, Rfl: 1   metroNIDAZOLE  (FLAGYL) 500 MG tablet, Take 1 tablet (500 mg total) by mouth 3 (three) times daily for 7 days., Disp: 21 tablet, Rfl: 0   Multiple Vitamin (MULTIVITAMIN WITH MINERALS) TABS tablet, Take 1 tablet by mouth daily., Disp: , Rfl:    ondansetron (ZOFRAN) 4 MG tablet, Take 1 tablet (4 mg total) by mouth every 8 (eight) hours as needed for nausea or vomiting., Disp: 20 tablet, Rfl: 0   paliperidone (INVEGA) 6 MG 24 hr tablet, Take 6 mg by mouth 2 (two) times daily., Disp: , Rfl:    REXULTI 2 MG TABS tablet, Take 2 mg by mouth daily., Disp: , Rfl:    Semaglutide, 2 MG/DOSE, (OZEMPIC, 2 MG/DOSE,) 8 MG/3ML SOPN, Inject 2 mg into the skin once a week., Disp: 3 mL, Rfl: 3   sildenafil (VIAGRA) 25 MG tablet, Take 1 tablet (25 mg total) by mouth daily as needed for erectile dysfunction., Disp: 14 tablet, Rfl: 0   Allergies  Allergen Reactions   Nabumetone Other (See Comments) and Nausea And Vomiting    Blood in stool   Fish Allergy Nausea Only   Bee Venom Swelling   Shellfish Allergy Nausea Only   Cephalexin Nausea And Vomiting    UPSET STOMACH    Past Medical History:  Diagnosis Date   Depression    Diabetes mellitus due to underlying condition with unspecified complications (HCC) 11/19/2018   Diabetes mellitus without complication (HCC)  DM (diabetes mellitus), type 2, uncontrolled 12/12/2016   Gallstones    H/O: HTN (hypertension) 07/18/2021   Hyperglycemia due to type 2 diabetes mellitus (HCC) 12/12/2016   Obesity, Class III, BMI 40-49.9 (morbid obesity) (HCC)      History reviewed. No pertinent surgical history.  Family History  Problem Relation Age of Onset   Hypertension Mother    Depression Mother    Lymphoma Mother    Lymphoma Maternal Grandmother    Heart attack Maternal Grandfather    Diabetes Maternal Aunt    Cancer Maternal Aunt        multi myeloma   Colon cancer Neg Hx    Esophageal cancer Neg Hx     Social History   Tobacco Use   Smoking status: Never    Passive  exposure: Never   Smokeless tobacco: Never  Vaping Use   Vaping status: Never Used  Substance Use Topics   Alcohol use: Not Currently    Comment: occ   Drug use: No    ROS   Objective:   Vitals: BP 100/72 (BP Location: Left Arm)   Pulse 96   Temp 98.7 F (37.1 C) (Oral)   Resp 18   SpO2 96%   Physical Exam Constitutional:      General: He is not in acute distress.    Appearance: Normal appearance. He is well-developed and normal weight. He is not ill-appearing, toxic-appearing or diaphoretic.  HENT:     Head: Normocephalic and atraumatic.      Comments: Airway is patent.  Patient is controlling secretions.    Right Ear: Tympanic membrane, ear canal and external ear normal. No drainage, swelling or tenderness. No middle ear effusion. There is no impacted cerumen. Tympanic membrane is not erythematous or bulging.     Left Ear: Tympanic membrane, ear canal and external ear normal. No drainage, swelling or tenderness.  No middle ear effusion. There is no impacted cerumen. Tympanic membrane is not erythematous or bulging.     Nose: Nose normal. No congestion or rhinorrhea.     Mouth/Throat:     Mouth: Mucous membranes are moist.     Pharynx: No oropharyngeal exudate or posterior oropharyngeal erythema.  Eyes:     General: No scleral icterus.       Right eye: No discharge.        Left eye: No discharge.     Extraocular Movements: Extraocular movements intact.     Conjunctiva/sclera: Conjunctivae normal.  Cardiovascular:     Rate and Rhythm: Normal rate and regular rhythm.     Heart sounds: Normal heart sounds. No murmur heard.    No friction rub. No gallop.  Pulmonary:     Effort: Pulmonary effort is normal. No respiratory distress.     Breath sounds: Normal breath sounds. No stridor. No wheezing, rhonchi or rales.  Musculoskeletal:     Cervical back: Normal range of motion and neck supple. No rigidity. No muscular tenderness.  Neurological:     General: No focal  deficit present.     Mental Status: He is alert and oriented to person, place, and time.  Psychiatric:        Mood and Affect: Mood normal.        Behavior: Behavior normal.        Thought Content: Thought content normal.     Assessment and Plan :   PDMP not reviewed this encounter.  1. Angioedema, initial encounter   2. Diverticulitis  Creatinine clearance 124mL/min.  High suspicion for ACE inhibitor induced angioedema.  Recommended stopping this medication.  Also possible that patient is having a reaction to either ciprofloxacin or metronidazole and therefore recommended oral prednisone.  Continue Benadryl.  Counseled patient on potential for adverse effects with medications prescribed/recommended today, ER and return-to-clinic precautions discussed, patient verbalized understanding.    Wallis Bamberg, [place] 07/18/23 1029

## 2023-07-18 NOTE — ED Triage Notes (Signed)
Pt reports swelling in upper lip since 4 am today. Pt took Benadryl around 6:00 am am and 9:40 am today. Denies trouble breathing, talking, swallowing. Pt started  Flagyl and Ciprofloxacin 2 days ago for diverticulitis.

## 2023-07-20 ENCOUNTER — Telehealth: Payer: Self-pay | Admitting: Family Medicine

## 2023-07-20 NOTE — Telephone Encounter (Signed)
Called pt and confirmed identity with DOB. Followed up on right-sided neck mass/LAD present at last visit 07/16/23. Pt states unchanged. Re-confirmed up to date on cx screenings, never smoker. Return precautions reiterated. Appt scheduled with PCP Dr. Sherrilee Gilles 08/04/23 @ 2:10pm for follow up.

## 2023-07-20 NOTE — Telephone Encounter (Signed)
-----   Message from Janit Pagan sent at 07/17/2023  6:41 AM EST ----- Hey Dr. Mliss Sax,  I think you forgot to mention his lymphadenopathy to me. Please ensure follow up is scheduled to monitor and also ensure he is up to date with his cancer screening.  Thanks.  En[place]ola

## 2023-07-21 LAB — LAB REPORT - SCANNED
Alb/Creat Ratio, Ur: 12 mg/g{creat}
Albumin, Urine POC: 4.1
Creatinine, POC: 34.7 mg/dL

## 2023-07-29 ENCOUNTER — Ambulatory Visit
Admission: EM | Admit: 2023-07-29 | Discharge: 2023-07-29 | Disposition: A | Discharge status: Home / Self Care | Payer: 59 | Attending: Family Medicine | Admitting: Family Medicine

## 2023-07-29 ENCOUNTER — Emergency Department (HOSPITAL_BASED_OUTPATIENT_CLINIC_OR_DEPARTMENT_OTHER)
Admission: EM | Admit: 2023-07-29 | Discharge: 2023-07-29 | Disposition: A | Discharge status: Home / Self Care | Payer: 59 | Attending: Emergency Medicine | Admitting: Emergency Medicine

## 2023-07-29 ENCOUNTER — Telehealth: Payer: Self-pay

## 2023-07-29 ENCOUNTER — Emergency Department (HOSPITAL_BASED_OUTPATIENT_CLINIC_OR_DEPARTMENT_OTHER): Payer: 59

## 2023-07-29 ENCOUNTER — Other Ambulatory Visit: Payer: Self-pay

## 2023-07-29 DIAGNOSIS — N1832 Chronic kidney disease, stage 3b: Secondary | ICD-10-CM | POA: Insufficient documentation

## 2023-07-29 DIAGNOSIS — K5792 Diverticulitis of intestine, part unspecified, without perforation or abscess without bleeding: Secondary | ICD-10-CM | POA: Insufficient documentation

## 2023-07-29 DIAGNOSIS — R109 Unspecified abdominal pain: Secondary | ICD-10-CM | POA: Diagnosis present

## 2023-07-29 DIAGNOSIS — Z8719 Personal history of other diseases of the digestive system: Secondary | ICD-10-CM | POA: Diagnosis not present

## 2023-07-29 DIAGNOSIS — R1032 Left lower quadrant pain: Secondary | ICD-10-CM

## 2023-07-29 DIAGNOSIS — Z7984 Long term (current) use of oral hypoglycemic drugs: Secondary | ICD-10-CM | POA: Insufficient documentation

## 2023-07-29 DIAGNOSIS — K8020 Calculus of gallbladder without cholecystitis without obstruction: Secondary | ICD-10-CM | POA: Insufficient documentation

## 2023-07-29 DIAGNOSIS — K5730 Diverticulosis of large intestine without perforation or abscess without bleeding: Secondary | ICD-10-CM | POA: Diagnosis not present

## 2023-07-29 DIAGNOSIS — I129 Hypertensive chronic kidney disease with stage 1 through stage 4 chronic kidney disease, or unspecified chronic kidney disease: Secondary | ICD-10-CM | POA: Insufficient documentation

## 2023-07-29 DIAGNOSIS — E1122 Type 2 diabetes mellitus with diabetic chronic kidney disease: Secondary | ICD-10-CM | POA: Insufficient documentation

## 2023-07-29 LAB — URINALYSIS, ROUTINE W REFLEX MICROSCOPIC
Bacteria, UA: NONE SEEN
Bilirubin Urine: NEGATIVE
Glucose, UA: >1000 mg/dL — AB
Hgb urine dipstick: NEGATIVE
Ketones, ur: NEGATIVE mg/dL
Leukocytes,Ua: NEGATIVE
Nitrite: NEGATIVE
Protein, ur: NEGATIVE mg/dL
Specific Gravity, Urine: 1.031 — ABNORMAL HIGH (ref 1.005–1.030)
pH: 5 (ref 5.0–8.0)

## 2023-07-29 LAB — COMPREHENSIVE METABOLIC PANEL
ALT: 16 U/L (ref 0–44)
AST: 13 U/L — ABNORMAL LOW (ref 15–41)
Albumin: 3.9 g/dL (ref 3.5–5.0)
Alkaline Phosphatase: 48 U/L (ref 38–126)
Anion gap: 10 (ref 5–15)
BUN: 9 mg/dL (ref 6–20)
CO2: 23 mmol/L (ref 22–32)
Calcium: 9.6 mg/dL (ref 8.9–10.3)
Chloride: 106 mmol/L (ref 98–111)
Creatinine, Ser: 1.52 mg/dL — ABNORMAL HIGH (ref 0.61–1.24)
GFR, Estimated: 57 mL/min — ABNORMAL LOW (ref >60)
Glucose, Bld: 81 mg/dL (ref 70–99)
Potassium: 4.5 mmol/L (ref 3.5–5.1)
Sodium: 139 mmol/L (ref 135–145)
Total Bilirubin: 0.4 mg/dL (ref 0.0–1.2)
Total Protein: 7 g/dL (ref 6.5–8.1)

## 2023-07-29 LAB — CBC
HCT: 42.8 % (ref 39.0–52.0)
Hemoglobin: 14 g/dL (ref 13.0–17.0)
MCH: 29.4 pg (ref 26.0–34.0)
MCHC: 32.7 g/dL (ref 30.0–36.0)
MCV: 89.7 fL (ref 80.0–100.0)
Platelets: 271 10*3/uL (ref 150–400)
RBC: 4.77 MIL/uL (ref 4.22–5.81)
RDW: 14.7 % (ref 11.5–15.5)
WBC: 13.4 10*3/uL — ABNORMAL HIGH (ref 4.0–10.5)
nRBC: 0 % (ref 0.0–0.2)

## 2023-07-29 LAB — LIPASE, BLOOD: Lipase: 63 U/L — ABNORMAL HIGH (ref 11–51)

## 2023-07-29 LAB — RESP PANEL BY RT-PCR (RSV, FLU A&B, COVID)  RVPGX2
Influenza A by PCR: NEGATIVE
Influenza B by PCR: NEGATIVE
Resp Syncytial Virus by PCR: NEGATIVE
SARS Coronavirus 2 by RT PCR: NEGATIVE

## 2023-07-29 MED ORDER — DOXYCYCLINE HYCLATE 100 MG PO TABS
100.0000 mg | ORAL_TABLET | Freq: Once | ORAL | Status: AC
  Administered 2023-07-29: 100 mg via ORAL
  Filled 2023-07-29: qty 1

## 2023-07-29 MED ORDER — IOHEXOL 300 MG/ML  SOLN
100.0000 mL | Freq: Once | INTRAMUSCULAR | Status: AC | PRN
  Administered 2023-07-29: 85 mL via INTRAVENOUS

## 2023-07-29 MED ORDER — DOXYCYCLINE HYCLATE 100 MG PO CAPS: 100.0000 mg | ORAL_CAPSULE | Freq: Two times a day (BID) | ORAL | 0 refills | Status: AC

## 2023-07-29 NOTE — ED Notes (Signed)
Patient is being discharged from the Urgent Care and sent to the Emergency Department via POV. Per Cheri Rous, NP, patient is in need of higher level of care due to possible diverticulitis. Patient is aware and verbalizes understanding of plan of care.  Vitals:   07/29/23 1504 07/29/23 1505  BP:  117/72  Pulse: (!) 119   Resp: 17   Temp: 99.4 F (37.4 C)   SpO2: 96%

## 2023-07-29 NOTE — ED Provider Notes (Signed)
[place] [facility] AT [facility] Provider Note  CSN: [id number] Arrival date & time: 07/29/23 1545  Chief Complaint(s) No chief complaint on file.  HPI Marc Long is a 48 y.o. male here today for abdominal pain.  Patient recently completed course of treatment for diverticulitis.  Says that beginning yesterday he began to have worsening pain in his abdomen.   Past Medical History Past Medical History:  Diagnosis Date   Depression    Diabetes mellitus due to underlying condition with unspecified complications (HCC) 11/19/2018   Diabetes mellitus without complication (HCC)    DM (diabetes mellitus), type 2, uncontrolled 12/12/2016   Gallstones    H/O: HTN (hypertension) 07/18/2021   Hyperglycemia due to type 2 diabetes mellitus (HCC) 12/12/2016   Obesity, Class III, BMI 40-49.9 (morbid obesity) (HCC)    Patient Active Problem List   Diagnosis Date Noted   Mass of right side of neck 07/16/2023   Abdominal pain 01/08/2023   Nausea 11/03/2022   Knee pain, left 11/03/2022   Left lower quadrant pain 10/21/2022   Flank pain 08/25/2022   Diverticulitis of colon 08/01/2022   Erectile dysfunction 07/24/2022   Hypotension 07/24/2022   Right forearm pain 05/14/2022   Constipation 05/06/2022   Chronic pain of both shoulders 11/27/2021   Stage 3b chronic kidney disease (HCC) 02/21/2021   Dizziness 02/21/2021   Schizophrenia (HCC) 04/06/2018   Severe recurrent major depression without psychotic features (HCC) 12/16/2017   Severe bipolar I disorder, most recent episode depressed (HCC)    Hypertriglyceridemia 02/05/2017   Suicidal ideations 12/12/2016   Obesity, Class III, BMI 40-49.9 (morbid obesity) (HCC)    Type 2 diabetes mellitus with complication, with long-term current use of insulin (HCC) 12/10/2016   Moderate single current episode of major depressive disorder (HCC) 04/15/2016   Shoulder pain 04/14/2016   Diverticulosis of large intestine without  hemorrhage 11/07/2015   Calculus of gallbladder without cholecystitis 11/07/2015   Home Medication(s) Prior to Admission medications   Medication Sig Start Date End Date Taking? Authorizing Provider  amoxicillin-clavulanate (AUGMENTIN) 875-125 MG tablet Take 1 tablet by mouth 2 (two) times daily. 07/18/23   Wallis Bamberg, PA-C  atorvastatin (LIPITOR) 40 MG tablet Take 1 tablet (40 mg total) by mouth daily. 04/01/23   Lincoln Brigham, MD  Blood Pressure Monitoring (COMFORT TOUCH BP CUFF/LARGE) MISC 1 each by Does not apply route once a week. 04/18/21   Cora Collum, DO  buPROPion (WELLBUTRIN XL) 300 MG 24 hr tablet Take 1 tablet (300 mg total) by mouth daily. 01/14/19   Malvin Johns, MD  clonazePAM (KLONOPIN) 0.5 MG tablet Take by mouth. 06/20/23   [provider]  diphenhydrAMINE (BENADRYL) 50 MG capsule Take 50 mg by mouth every 6 (six) hours as needed.    [provider]  empagliflozin (JARDIANCE) 10 MG TABS tablet Take 1 tablet (10 mg total) by mouth daily. 02/25/23   Lincoln Brigham, MD  fluvoxaMINE (LUVOX) 100 MG tablet     [provider]  glucose blood (ACCU-CHEK GUIDE) test strip Please use to check blood sugar once daily. 01/21/22   Littie Deeds, MD  metFORMIN (GLUCOPHAGE) 1000 MG tablet TAKE ONE TABLET BY MOUTH TWICE A DAY WITH A MEAL FOR DIABETES 11/11/22   Littie Deeds, MD  Multiple Vitamin (MULTIVITAMIN WITH MINERALS) TABS tablet Take 1 tablet by mouth daily.    [provider]  ondansetron (ZOFRAN) 4 MG tablet Take 1 tablet (4 mg total) by mouth every 8 (eight)  hours as needed for nausea or vomiting. 11/03/22   Cora Collum, DO  paliperidone (INVEGA) 6 MG 24 hr tablet Take 6 mg by mouth 2 (two) times daily.    [provider]  predniSONE (DELTASONE) 20 MG tablet Take 2 tablets daily with breakfast. 07/18/23   Wallis Bamberg, PA-C  REXULTI 2 MG TABS tablet Take 2 mg by mouth daily. 03/26/22   [provider]  Semaglutide, 2 MG/DOSE, (OZEMPIC,  2 MG/DOSE,) 8 MG/3ML SOPN Inject 2 mg into the skin once a week. 04/28/23   Lincoln Brigham, MD  sildenafil (VIAGRA) 25 MG tablet Take 1 tablet (25 mg total) by mouth daily as needed for erectile dysfunction. 10/21/22   Cora Collum, DO                                                                                                                                    Past Surgical History No past surgical history on file. Family History Family History  Problem Relation Age of Onset   Hypertension Mother    Depression Mother    Lymphoma Mother    Lymphoma Maternal Grandmother    Heart attack Maternal Grandfather    Diabetes Maternal Aunt    Cancer Maternal Aunt        multi myeloma   Colon cancer Neg Hx    Esophageal cancer Neg Hx     Social History Social History   Tobacco Use   Smoking status: Never    Passive exposure: Never   Smokeless tobacco: Never  Vaping Use   Vaping status: Never Used  Substance Use Topics   Alcohol use: Not Currently    Comment: occ   Drug use: No   Allergies Nabumetone, Fish allergy, Bee venom, Lisinopril, Shellfish allergy, and Cephalexin  Review of Systems Review of Systems  Physical Exam Vital Signs  I have reviewed the triage vital signs BP (!) 141/99 (BP Location: Right Arm)   Pulse (!) 107   Temp 97.8 F (36.6 C)   Resp 20   SpO2 99%   Physical Exam Cardiovascular:     Rate and Rhythm: Normal rate.  Pulmonary:     Effort: Pulmonary effort is normal.  Abdominal:     General: Abdomen is flat. There is no distension.     Palpations: Abdomen is soft. There is no mass.     Tenderness: There is no abdominal tenderness. There is no guarding.  Musculoskeletal:     Cervical back: Normal range of motion.     ED Results and Treatments Labs (all labs ordered are listed, but only abnormal results are displayed) Labs Reviewed  CBC - Abnormal; Notable for the following components:      Result Value   WBC 13.4 (*)    All other  components within normal limits  URINALYSIS, ROUTINE W REFLEX MICROSCOPIC - Abnormal; Notable for the following components:   Specific  Gravity, Urine 1.031 (*)    Glucose, UA >1,000 (*)    All other components within normal limits  RESP PANEL BY RT-PCR (RSV, FLU A&B, COVID)  RVPGX2  COMPREHENSIVE METABOLIC PANEL  LIPASE, BLOOD                                                                                                                          Radiology No results found.  Pertinent labs & imaging results that were available during my care of the patient were reviewed by me and considered in my medical decision making (see MDM for details).  Medications Ordered in ED Medications - No data to display                                                                                                                                   Procedures Procedures  (including critical care time)  Medical Decision Making / ED Course   This patient presents to the ED for concern of abdominal pain, this involves an extensive number of treatment options, and is a complaint that carries with it a high risk of complications and morbidity.  The differential diagnosis includes diverticulitis, enteritis, colitis.  MDM: On exam, patient's abdomen overall feels soft.  Had no specific tenderness.  Will repeat imaging of patient's abdomen, blood work ordered.  Patient afebrile, nontoxic.  Reassessment 10:40 PM-patient with multiple diverticular disease, since he has failed outpatient therapy, did reach out to Dr. Chales Abrahams from [facility].  He recommended starting the patient on doxycycline and following up in the office.  Discussed this with the patient he was agreeable with the plan.  Will discharge.  Additional history obtained: -Additional history obtained from  -External records from outside source obtained and reviewed including: Chart review including previous notes, labs, imaging, consultation  notes   Lab Tests: -I ordered, reviewed, and interpreted labs.   The pertinent results include:   Labs Reviewed  CBC - Abnormal; Notable for the following components:      Result Value   WBC 13.4 (*)    All other components within normal limits  URINALYSIS, ROUTINE W REFLEX MICROSCOPIC - Abnormal; Notable for the following components:   Specific Gravity, Urine 1.031 (*)    Glucose, UA >1,000 (*)    All other components within normal limits  RESP PANEL BY RT-PCR (RSV, FLU A&B, COVID)  RVPGX2  COMPREHENSIVE METABOLIC  PANEL  LIPASE, BLOOD      EKG   EKG Interpretation Date/Time:    Ventricular Rate:    PR Interval:    QRS Duration:    QT Interval:    QTC Calculation:   R Axis:      Text Interpretation:           Imaging Studies ordered: I ordered imaging studies including imaging of the abdomen pelvis I independently visualized and interpreted imaging. I agree with the radiologist interpretation   Medicines ordered and prescription drug management: No orders of the defined types were placed in this encounter.   -I have reviewed the patients home medicines and have made adjustments as needed     Cardiac Monitoring: The patient was maintained on a cardiac monitor.  I personally viewed and interpreted the cardiac monitored which showed an underlying rhythm of: Normal sinus rhythm  Social Determinants of Health:  Factors impacting patients care include:    Reevaluation: After the interventions noted above, I reevaluated the patient and found that they have :improved  Co morbidities that complicate the patient evaluation  Past Medical History:  Diagnosis Date   Depression    Diabetes mellitus due to underlying condition with unspecified complications (HCC) 11/19/2018   Diabetes mellitus without complication (HCC)    DM (diabetes mellitus), type 2, uncontrolled 12/12/2016   Gallstones    H/O: HTN (hypertension) 07/18/2021   Hyperglycemia due to type 2  diabetes mellitus (HCC) 12/12/2016   Obesity, Class III, BMI 40-49.9 (morbid obesity) (HCC)       Dispostion: I considered admission for this patient, however after consultation with GI, patient appropriate for outpatient follow-up.     Final Clinical Impression(s) / ED Diagnoses Final diagnoses:  None     @PCDICTATION @    Anders Simmonds T, DO 07/29/23 2241

## 2023-07-29 NOTE — Telephone Encounter (Signed)
Patient calls nurse line requesting prescription for antibiotics for diverticulitis flare up.   He was treated for the same on 07/16/23. Initially prescribed Cipro 500 mg and Metronidazole. This was then changed by urgent care after patient had possible allergic reaction. He finished course of Augmentin and prednisone. After finishing these medications, he began having LLQ abdominal pain which started yesterday.   He denies fever, chills, vomiting or diarrhea.   Advised that I would send message to Dr. Sherrilee Gilles, however, he may need an appointment prior to additional prescriptions being sent in.   Patient verbalizes understanding and requests returned call with PCP recommendation.   Veronda Prude, RN

## 2023-07-29 NOTE — ED Notes (Signed)
CMP and Lipase sent to lab

## 2023-07-29 NOTE — ED Triage Notes (Signed)
Pt presents with c/o lt side pain that started yesterday. Pt states he has a hx of diverticulitis. Pt states he has taken tylenol yesterday.

## 2023-07-29 NOTE — Discharge Instructions (Addendum)
You can take doxycycline once in morning once in the evening for the next 14 days.  Please follow-up with [facility].  You can call them and tell them that the ED doctor spoke with Dr. Chales Abrahams and he wanted you seen within the next 1 to 2 weeks.  Return to the emergency room if you start to develop diarrhea.

## 2023-07-29 NOTE — ED Triage Notes (Signed)
LLQ x24hrs-tender palpation. HX diverticulitis. Low grade fever. -N/-V/-D. Denies urinary symptoms, CP, SOB.  PCP ordered CIPRO and Flagyl-2 weeks-allergic reaction- saw UC- stopped CIPRO, Flagyl, and ACE inhibitor- started on amox and prednisone-finished that course 2/8.

## 2023-07-29 NOTE — ED Provider Notes (Signed)
[facility]    CSN: [id number] Arrival date & time: 07/29/23  1442      History   Chief Complaint Chief Complaint  Patient presents with   Flank Pain    HPI Marc Long is a 48 y.o. male presents for abdominal pain.  Patient is accompanied by family member.  Patient reports yesterday he began developing left lower abdominal pain that has been constant.  He states he has a history of diverticulitis and this feels similar to previous diverticulitis flares.  Denies any fevers, nausea/vomiting/diarrhea.  Reports normal bowel movements.  Patient was seen by his PCP on January 30 and treated with Cipro and metronidazole for diverticulitis.  He went to urgent care the next day on February 1 for angioedema where there was a concern that it could be related to either his lisinopril or the Cipro metronidazole so he was advised to stop all 3 and he was switched to Augmentin.  He states he completed the Augmentin and symptoms improved but then worsened yesterday.  He took some Tylenol OTC without improvement in symptoms.   Flank Pain Associated symptoms include abdominal pain.    Past Medical History:  Diagnosis Date   Depression    Diabetes mellitus due to underlying condition with unspecified complications (HCC) 11/19/2018   Diabetes mellitus without complication (HCC)    DM (diabetes mellitus), type 2, uncontrolled 12/12/2016   Gallstones    H/O: HTN (hypertension) 07/18/2021   Hyperglycemia due to type 2 diabetes mellitus (HCC) 12/12/2016   Obesity, Class III, BMI 40-49.9 (morbid obesity) (HCC)     Patient Active Problem List   Diagnosis Date Noted   Mass of right side of neck 07/16/2023   Abdominal pain 01/08/2023   Nausea 11/03/2022   Knee pain, left 11/03/2022   Left lower quadrant pain 10/21/2022   Flank pain 08/25/2022   Diverticulitis of colon 08/01/2022   Erectile dysfunction 07/24/2022   Hypotension 07/24/2022   Right forearm pain 05/14/2022   Constipation  05/06/2022   Chronic pain of both shoulders 11/27/2021   Stage 3b chronic kidney disease (HCC) 02/21/2021   Dizziness 02/21/2021   Schizophrenia (HCC) 04/06/2018   Severe recurrent major depression without psychotic features (HCC) 12/16/2017   Severe bipolar I disorder, most recent episode depressed (HCC)    Hypertriglyceridemia 02/05/2017   Suicidal ideations 12/12/2016   Obesity, Class III, BMI 40-49.9 (morbid obesity) (HCC)    Type 2 diabetes mellitus with complication, with long-term current use of insulin (HCC) 12/10/2016   Moderate single current episode of major depressive disorder (HCC) 04/15/2016   Shoulder pain 04/14/2016   Diverticulosis of large intestine without hemorrhage 11/07/2015   Calculus of gallbladder without cholecystitis 11/07/2015    History reviewed. No pertinent surgical history.     Home Medications    Prior to Admission medications   Medication Sig Start Date End Date Taking? Authorizing Provider  amoxicillin-clavulanate (AUGMENTIN) 875-125 MG tablet Take 1 tablet by mouth 2 (two) times daily. 07/18/23   Wallis Bamberg, PA-C  atorvastatin (LIPITOR) 40 MG tablet Take 1 tablet (40 mg total) by mouth daily. 04/01/23   Lincoln Brigham, MD  Blood Pressure Monitoring (COMFORT TOUCH BP CUFF/LARGE) MISC 1 each by Does not apply route once a week. 04/18/21   Cora Collum, DO  buPROPion (WELLBUTRIN XL) 300 MG 24 hr tablet Take 1 tablet (300 mg total) by mouth daily. 01/14/19   Malvin Johns, MD  clonazePAM (KLONOPIN) 0.5 MG tablet Take by mouth. 06/20/23  [provider]  diphenhydrAMINE (BENADRYL) 50 MG capsule Take 50 mg by mouth every 6 (six) hours as needed.    [provider]  empagliflozin (JARDIANCE) 10 MG TABS tablet Take 1 tablet (10 mg total) by mouth daily. 02/25/23   Lincoln Brigham, MD  fluvoxaMINE (LUVOX) 100 MG tablet     [provider]  glucose blood (ACCU-CHEK GUIDE) test strip Please use to check blood sugar once daily. 01/21/22    Littie Deeds, MD  metFORMIN (GLUCOPHAGE) 1000 MG tablet TAKE ONE TABLET BY MOUTH TWICE A DAY WITH A MEAL FOR DIABETES 11/11/22   Littie Deeds, MD  Multiple Vitamin (MULTIVITAMIN WITH MINERALS) TABS tablet Take 1 tablet by mouth daily.    [provider]  ondansetron (ZOFRAN) 4 MG tablet Take 1 tablet (4 mg total) by mouth every 8 (eight) hours as needed for nausea or vomiting. 11/03/22   Idalia Needle, Lucas Mallow, DO  paliperidone (INVEGA) 6 MG 24 hr tablet Take 6 mg by mouth 2 (two) times daily.    [provider]  predniSONE (DELTASONE) 20 MG tablet Take 2 tablets daily with breakfast. 07/18/23   Wallis Bamberg, PA-C  REXULTI 2 MG TABS tablet Take 2 mg by mouth daily. 03/26/22   [provider]  Semaglutide, 2 MG/DOSE, (OZEMPIC, 2 MG/DOSE,) 8 MG/3ML SOPN Inject 2 mg into the skin once a week. 04/28/23   Lincoln Brigham, MD  sildenafil (VIAGRA) 25 MG tablet Take 1 tablet (25 mg total) by mouth daily as needed for erectile dysfunction. 10/21/22   Cora Collum, DO    Family History Family History  Problem Relation Age of Onset   Hypertension Mother    Depression Mother    Lymphoma Mother    Lymphoma Maternal Grandmother    Heart attack Maternal Grandfather    Diabetes Maternal Aunt    Cancer Maternal Aunt        multi myeloma   Colon cancer Neg Hx    Esophageal cancer Neg Hx     Social History Social History   Tobacco Use   Smoking status: Never    Passive exposure: Never   Smokeless tobacco: Never  Vaping Use   Vaping status: Never Used  Substance Use Topics   Alcohol use: Not Currently    Comment: occ   Drug use: No     Allergies   Nabumetone, Fish allergy, Bee venom, Lisinopril, Shellfish allergy, and Cephalexin   Review of Systems Review of Systems  Gastrointestinal:  Positive for abdominal pain.     Physical Exam Triage Vital Signs ED Triage Vitals  Encounter Vitals Group     BP 07/29/23 1505 117/72     Systolic BP Percentile --      Diastolic  BP Percentile --      Pulse Rate 07/29/23 1504 (!) 119     Resp 07/29/23 1504 17     Temp 07/29/23 1504 99.4 F (37.4 C)     Temp Source 07/29/23 1504 Oral     SpO2 07/29/23 1504 96 %     Weight --      Height --      Head Circumference --      Peak Flow --      Pain Score 07/29/23 1502 6     Pain Loc --      Pain Education --      Exclude from Growth Chart --    No data found.  Updated Vital Signs BP 117/72  Pulse (!) 119   Temp 99.4 F (37.4 C) (Oral)   Resp 17   SpO2 96%   Visual Acuity Right Eye Distance:   Left Eye Distance:   Bilateral Distance:    Right Eye Near:   Left Eye Near:    Bilateral Near:     Physical Exam Vitals and nursing note reviewed.  Constitutional:      General: He is not in acute distress.    Appearance: Normal appearance. He is not ill-appearing.  HENT:     Head: Normocephalic and atraumatic.  Eyes:     Pupils: Pupils are equal, round, and reactive to light.  Cardiovascular:     Rate and Rhythm: Tachycardia present.     Comments: Heart rate 119 Pulmonary:     Effort: Pulmonary effort is normal.  Abdominal:     General: Bowel sounds are normal.     Palpations: Abdomen is soft. There is no hepatomegaly or splenomegaly.     Tenderness: There is abdominal tenderness in the left upper quadrant and left lower quadrant.  Skin:    General: Skin is warm and dry.  Neurological:     General: No focal deficit present.     Mental Status: He is alert and oriented to person, place, and time.      UC Treatments / Results  Labs (all labs ordered are listed, but only abnormal results are displayed) Labs Reviewed - No data to display  EKG   Radiology No results found.  Procedures Procedures (including critical care time)  Medications Ordered in UC Medications - No data to display  Initial Impression / Assessment and Plan / UC Course  I have reviewed the triage vital signs and the nursing notes.  Pertinent labs & imaging  results that were available during my care of the patient were reviewed by me and considered in my medical decision making (see chart for details).     I reviewed exam and symptoms with patient.  Discussed limitations and abilities of urgent care.  Patient recently completed treatment for diverticulitis 4 days ago with recurrence of symptoms.  He does present with tachycardia and low-grade fever in clinic as well as tenderness to his left lower quadrant.  Given this I advised to go to the emergency room for further evaluation to rule out rupture/complications.  He is in agreement plan will go POV to the emergency room. Final Clinical Impressions(s) / UC Diagnoses   Final diagnoses:  LLQ abdominal pain  History of diverticulitis     Discharge Instructions      Please go to the ER for further evaluation and workup of your symptoms    ED Prescriptions   None    PDMP not reviewed this encounter.   Radford Pax, NP 07/29/23 1531

## 2023-07-29 NOTE — Discharge Instructions (Signed)
Please go to the ER for further evaluation and workup of your symptoms

## 2023-07-29 NOTE — ED Provider Triage Note (Signed)
Emergency Medicine Provider Triage Evaluation Note  Rhen Kawecki , a 48 y.o. male  was evaluated in triage.  Pt complains of LLQ pain.  Review of Systems  Positive:  Negative:   Physical Exam  BP (!) 141/99 (BP Location: Right Arm)   Pulse (!) 107   Temp 97.8 F (36.6 C)   Resp 20   SpO2 99%  Gen:   Awake, no distress   Resp:  Normal effort  MSK:   Moves extremities without difficulty  Other:    Medical Decision Making  Medically screening exam initiated at 4:23 PM.  Appropriate orders placed.  Grant Ruts was informed that the remainder of the evaluation will be completed by another provider, this initial triage assessment does not replace that evaluation, and the importance of remaining in the ED until their evaluation is complete.  LLQ abdominal pain. No other symptoms.    Dorthy Cooler, [place] 07/29/23 1624

## 2023-07-30 NOTE — Progress Notes (Unsigned)
ASSESSMENT    Brief Narrative:  48 y.o.  male known to Dr. Lavon Paganini  with a past medical history not limited to diverticulitis cholelithiasis, DM2, obesity, CKD3b, bipolar 1, schizophrenia, MDD, suicidal ideation. See PMH for any additional medical & surgical history    Left side diverticulitis without perforation or abscess  Persistent symptoms following a week of empiric Augmentin.  CT scan demonstrating wall thickening and inflammation involving the distal descending and sigmoid colon, with additional stranding at the mid transverse colon. On day #2 of Doxycycline with some improvement in pain  Recent swelling of lips apparently felt to be 2/2 to either Cipro, Flagyl or Lisinopril.  Resolved. Medications were discontinued.  Unclear why he would have allergic reaction to cipro / flagyl when he took them previously.  May have been related to Lisinopril though he had been taking that for quite some time.   DM2 / obesity.  On Ozempic  Chronic mild elevation of lipase < 3 x ULN without radiographic signs of pancreatitis   PLAN   --Change to clear liquid diet over the weekend.  -Trial of Bentyl 10 mg Q 8 hours as needed for abdominal pain. Can worsen constipation so advised to take Mirlax 1-2 times daily if needed for constipation --Continue Doxycycline --Will ask RN Sharol Harness to contact patient on Monday for a condition update. If not improving then may need admission for IV antibiotics. If worse over weekend then back to ED.   --He should get a colonoscopy iafter resolution of diverticulitis to rule out underlying colon neoplasm --Will add cipro / flagyl on list of allergies since we cannot know for sure what caused the lip swelling. Lisinopril has already been added   HPI   Chief complaint : diverticulitis  Brief GI History  Patient is here with his mother. He was seen here in Feb 2024 for evaluation of diverticulitis found on CT scan in January. Symptoms resolved with  antibiotics.    Interval History :  Patient developed recurrent LLQ pain in July. PCP treated him empirically with cipro and flagyl . Symptoms resolved.   A few weeks ago he developed recurrent LLQ pain and was again started on cipro and flagyl. However he developed lip swelling after one day . Went to urgent care. Antibiotics changed to Augmentin x 7 days.  He had been taking Lisinopril for a long time but that was also discontinued. Pain improved with Augmenting but recurred within a few days after finishing antibiotics. He went to ED on 07/29/23. CT scan w contrast  showed extensive left sided diverticular disease and wall thickening and inflammation involving the distal descending and sigmoid colon with additional stranding at the mid transverse colon without perforation / abscess. WBC was 13K. Prescribed Doxycycline and released from ED.   On day # 2 of  Doxycycline.  The abdominal pain has improved (maybe a 4 out of 10 on pain scale) unless he moves around at which time pain significantly increases. Heating pad helps. Temp 99.6 yesterday, no fever today. No urinary symptoms.  He has some mild constipation but has miralax to take if needed.    07/29/23 CT AP with contrast 1. Extensive diverticular disease of the colon. Wall thickening and inflammation involving the distal descending and sigmoid colon, with additional stranding at the mid transverse colon, findings felt consistent with acute diverticulitis. No perforation or abscess. 2. Cholelithiasis. 3. 2 cm subcutaneous cyst at the lower back slightly to the left of midline. Correlate with  direct inspection   GI History / Pertinent GI Studies   **All endoscopic studies may not be included here   Colonoscopy April 2017 for left lower quadrant pain and diarrhea --- The examined portion of the ileum was normal. - Diverticulosis in the entire examined colon. - The entire examined colon is otherwise normal. Biopsied.      Latest Ref Rng &  Units 07/29/2023    7:44 PM 06/25/2022    4:45 PM 02/01/2021   11:31 AM  Hepatic Function  Total Protein 6.5 - 8.1 g/dL 7.0  8.4  7.3   Albumin 3.5 - 5.0 g/dL 3.9  4.5  4.6   AST 15 - 41 U/L 13  30  19    ALT 0 - 44 U/L 16  27  25    Alk Phosphatase 38 - 126 U/L 48  51  56   Total Bilirubin 0.0 - 1.2 mg/dL 0.4  0.5  0.2        Latest Ref Rng & Units 07/29/2023    4:32 PM 01/08/2023    4:41 PM 06/25/2022    4:45 PM  CBC  WBC 4.0 - 10.5 K/uL 13.4  6.7  10.3   Hemoglobin 13.0 - 17.0 g/dL 16.1  09.6  04.5   Hematocrit 39.0 - 52.0 % 42.8  43.3  43.7   Platelets 150 - 400 K/uL 271  288  310      Past Medical History:  Diagnosis Date   Depression    Diabetes mellitus due to underlying condition with unspecified complications (HCC) 11/19/2018   Diabetes mellitus without complication (HCC)    DM (diabetes mellitus), type 2, uncontrolled 12/12/2016   Gallstones    H/O: HTN (hypertension) 07/18/2021   Hyperglycemia due to type 2 diabetes mellitus (HCC) 12/12/2016   Obesity, Class III, BMI 40-49.9 (morbid obesity) (HCC)     History reviewed. No pertinent surgical history.  Family History  Problem Relation Age of Onset   Hypertension Mother    Depression Mother    Lymphoma Mother    Lymphoma Maternal Grandmother    Heart attack Maternal Grandfather    Diabetes Maternal Aunt    Cancer Maternal Aunt        multi myeloma   Colon cancer Neg Hx    Esophageal cancer Neg Hx     Current Medications, Allergies, Family History and Social History were reviewed in [employer] record.     Current Outpatient Medications  Medication Sig Dispense Refill   atorvastatin (LIPITOR) 40 MG tablet Take 1 tablet (40 mg total) by mouth daily. 90 tablet 3   Blood Pressure Monitoring (COMFORT TOUCH BP CUFF/LARGE) MISC 1 each by Does not apply route once a week. 1 each 0   buPROPion (WELLBUTRIN XL) 300 MG 24 hr tablet Take 1 tablet (300 mg total) by mouth daily. 90 tablet 1   clonazePAM  (KLONOPIN) 0.5 MG tablet Take by mouth.     doxycycline (VIBRAMYCIN) 100 MG capsule Take 1 capsule (100 mg total) by mouth 2 (two) times daily for 14 days. 28 capsule 0   empagliflozin (JARDIANCE) 10 MG TABS tablet Take 1 tablet (10 mg total) by mouth daily. 90 tablet 2   fluvoxaMINE (LUVOX) 100 MG tablet      glucose blood (ACCU-CHEK GUIDE) test strip Please use to check blood sugar once daily. 100 each 12   metFORMIN (GLUCOPHAGE) 1000 MG tablet TAKE ONE TABLET BY MOUTH TWICE A DAY WITH A MEAL FOR DIABETES  180 tablet 1   Multiple Vitamin (MULTIVITAMIN WITH MINERALS) TABS tablet Take 1 tablet by mouth daily.     ondansetron (ZOFRAN) 4 MG tablet Take 1 tablet (4 mg total) by mouth every 8 (eight) hours as needed for nausea or vomiting. 20 tablet 0   paliperidone (INVEGA) 6 MG 24 hr tablet Take 6 mg by mouth 2 (two) times daily.     predniSONE (DELTASONE) 20 MG tablet Take 2 tablets daily with breakfast. 10 tablet 0   REXULTI 2 MG TABS tablet Take 2 mg by mouth daily.     Semaglutide, 2 MG/DOSE, (OZEMPIC, 2 MG/DOSE,) 8 MG/3ML SOPN Inject 2 mg into the skin once a week. 3 mL 3   sildenafil (VIAGRA) 25 MG tablet Take 1 tablet (25 mg total) by mouth daily as needed for erectile dysfunction. 14 tablet 0   amoxicillin-clavulanate (AUGMENTIN) 875-125 MG tablet Take 1 tablet by mouth 2 (two) times daily. (Patient not taking: Reported on 07/31/2023) 14 tablet 0   diphenhydrAMINE (BENADRYL) 50 MG capsule Take 50 mg by mouth every 6 (six) hours as needed. (Patient not taking: Reported on 07/31/2023)     No current facility-administered medications for this visit.    Review of Systems: No chest pain. No shortness of breath. No urinary complaints.    Physical Exam  Filed Weights   07/31/23 0949  Weight: 245 lb 8 oz (111.4 kg)   Wt Readings from Last 3 Encounters:  07/31/23 245 lb 8 oz (111.4 kg)  07/16/23 248 lb (112.5 kg)  04/13/23 258 lb 12.8 oz (117.4 kg)    BP 102/72   Pulse 90   Ht 5\' 11"   (1.803 m)   Wt 245 lb 8 oz (111.4 kg)   BMI 34.24 kg/m  Constitutional:  Pleasant, generally well appearing male in no acute distress. Psychiatric: Normal mood and affect. Behavior is normal. EENT: Pupils normal.  Conjunctivae are normal. No scleral icterus. Neck supple.  Cardiovascular: Normal rate, regular rhythm.  Pulmonary/chest: Effort normal and breath sounds normal. No wheezing, rales or rhonchi. Abdominal: Soft, nondistended, mil-moderate LLQ tenderness. Bowel sounds active throughout. There are no masses palpable. No hepatomegaly. Neurological: Alert and oriented to person place and time.    Willette Cluster, NP  07/31/2023, 10:37 AM

## 2023-07-31 ENCOUNTER — Encounter: Payer: Self-pay | Admitting: Nurse Practitioner

## 2023-07-31 ENCOUNTER — Ambulatory Visit: Payer: 59 | Admitting: Nurse Practitioner

## 2023-07-31 VITALS — BP 102/72 | HR 90 | Ht 71.0 in | Wt 245.5 lb

## 2023-07-31 DIAGNOSIS — K5792 Diverticulitis of intestine, part unspecified, without perforation or abscess without bleeding: Secondary | ICD-10-CM

## 2023-07-31 DIAGNOSIS — K5732 Diverticulitis of large intestine without perforation or abscess without bleeding: Secondary | ICD-10-CM | POA: Diagnosis not present

## 2023-07-31 MED ORDER — DICYCLOMINE HCL 10 MG PO CAPS: 10.0000 mg | ORAL_CAPSULE | Freq: Three times a day (TID) | ORAL | 0 refills | Status: DC | PRN

## 2023-07-31 NOTE — Patient Instructions (Addendum)
_______________________________________________________  If your blood pressure at your visit was 140/90 or greater, please contact your primary care physician to follow up on this.  _______________________________________________________  If you are age 47 or older, your body mass index should be between 23-30. Your Body mass index is 34.24 kg/m. If this is out of the aforementioned range listed, please consider follow up with your Primary Care Provider.  If you are age 76 or younger, your body mass index should be between 19-25. Your Body mass index is 34.24 kg/m. If this is out of the aformentioned range listed, please consider follow up with your Primary Care Provider.   ________________________________________________________  The [place] providers would like to encourage you to use [facility] to communicate with providers for non-urgent requests or questions.  Due to long hold times on the telephone, sending your provider a message by [facility] may be a faster and more efficient way to get a response.  Please allow 48 business hours for a response.  Please remember that this is for non-urgent requests.  _______________________________________________________  Clear liquids diet over the weekend  Complete antibiotic course   We have sent the following medications to your pharmacy for you to pick up at your convenience: Bentyl 10 mg 3 times a day as needed  Note: Bentyl can constipation cause please do miralax 1-2 a day as needed   Call us on Monday to speak to the nurse with a condition update. If over the weekend you are getting worse please go to the ED   It was a pleasure to see you today!  Thank you for trusting me with your gastrointestinal care!

## 2023-08-03 ENCOUNTER — Telehealth: Payer: Self-pay | Admitting: Nurse Practitioner

## 2023-08-03 NOTE — Telephone Encounter (Signed)
Patient returned call. Patient states that since starting the antibiotics his pain has decreased from 9/10 to 3/10. Pt describes as a "constant and dull pain". Pt also mentioned that he had diarrhea over the weekend, 5 episodes of diarrhea yesterday. No bowel movement today. I told pt the diarrhea could have been present since he has been on a liquid diet, pt started introducing solids today. Pt reports that if he coughs, sneezes, or breathes too deeply he will aggravate his symptoms and pain increased to about a 7/10. Pt takes Bentyl 10 mg when this happens and pain resolves for about 2 hours. I advised pt that even after treatment he may have some residual pain and can take Bentyl on a scheduled bases if needed to help. Will send update to Gunnar Fusi, pt is aware that we will contact him if Gunnar Fusi has any additional recommendations. Pt verbalized understanding.

## 2023-08-03 NOTE — Telephone Encounter (Deleted)
-----   Message from Nurse Deno Etienne sent at 07/31/2023 12:51 PM EST -----  ----- Message ----- From: Meredith Pel, NP Sent: 07/31/2023  12:29 PM EST To: Marisa Sprinkles, RN  [place], will you please call this patient on Monday and find out if he is feeling better.  He is being treated for diverticulitis.  I put him on a clear liquid diet until he hears from you on Monday.  He is on doxycycline as prescribed by the ED. I gave him Bentyl to help with the pain, told to go to the ED over the weekend if pain gets worse, otherwise would see where things are when we talk to him on Monday  Thanks

## 2023-08-03 NOTE — Telephone Encounter (Signed)
Inbound call with an update on diverticulitis. Symptoms have improved and he wants to speak to nurse if there's anything else he can do to keep things going great

## 2023-08-03 NOTE — Telephone Encounter (Signed)
Attempted to return call to patient. Phone goes straight to voicemail, left message on voicemail for patient to return call.

## 2023-08-03 NOTE — Telephone Encounter (Signed)
 Error

## 2023-08-04 ENCOUNTER — Encounter: Payer: Self-pay | Admitting: Family Medicine

## 2023-08-04 ENCOUNTER — Ambulatory Visit: Payer: 59 | Admitting: Family Medicine

## 2023-08-04 VITALS — BP 117/86 | HR 88 | Ht 71.0 in | Wt 244.2 lb

## 2023-08-04 DIAGNOSIS — E118 Type 2 diabetes mellitus with unspecified complications: Secondary | ICD-10-CM | POA: Diagnosis not present

## 2023-08-04 DIAGNOSIS — N189 Chronic kidney disease, unspecified: Secondary | ICD-10-CM | POA: Diagnosis not present

## 2023-08-04 DIAGNOSIS — Z794 Long term (current) use of insulin: Secondary | ICD-10-CM

## 2023-08-04 DIAGNOSIS — K5792 Diverticulitis of intestine, part unspecified, without perforation or abscess without bleeding: Secondary | ICD-10-CM

## 2023-08-04 LAB — POCT GLYCOSYLATED HEMOGLOBIN (HGB A1C): HbA1c, POC (controlled diabetic range): 5.6 % (ref 0.0–7.0)

## 2023-08-04 NOTE — Assessment & Plan Note (Signed)
Due for A1c today.   -Continue Jardiance, Ozempic, metformin

## 2023-08-04 NOTE — Telephone Encounter (Signed)
Reminder in epic to follow up with patient later this week for symptom update.

## 2023-08-04 NOTE — Progress Notes (Signed)
    SUBJECTIVE:   CHIEF COMPLAINT / HPI:   JH is a 48yo M w/ hx of T2DM, CKD, schizophrenia, bipolar1 that p/f ED f/u - Recently being treated for diverticulitis with doxycycline.  - Also had angioedema reaction and went to ED 2/1 for this - Lisinopril was stopped due to reported lip swelling. It was unclear if it was due to lisinopril, flagyl, or ciprofloxacin that he was taking at that time. Of note, he has tolerated all these meds previously.  - He was on lisinopril for renal protection.  His nephrologist said that he still has Jardiance on for renal protection. -Does not have history of angioedema.  Lip swelling has since improved since discontinuation of those medications.   - PHQ9 question 9 positive. Discussed with pt and he report he is at his baseline suicidally and does not have active plans or intention to harm or kill himself.   OBJECTIVE:   BP 117/86   Pulse 88   Ht 5\' 11"  (1.803 m)   Wt 244 lb 3.2 oz (110.8 kg)   SpO2 98%   BMI 34.06 kg/m   General: Alert, pleasant well-appearing man. NAD. HEENT: NCAT. MMM. CV: RRR, no murmurs.  Resp: CTAB, no wheezing or crackles. Normal WOB on RA.  Abm: Tender to palpation in left lower quadrant.  No rebound, guarding, rigidity.  Soft and nondistended throughout. BS present. Ext: Moves all ext spontaneously Skin: Warm, well perfused   ASSESSMENT/PLAN:   Assessment & Plan Diverticulitis Improving since taking doxycycline.  Vital signs stable, exam reassuring.  Advised to complete entire 14-day doxycycline course. -Advised to continue MiraLAX and high-fiber diet as needed to prevent constipation -Per GI note, they recommend f/u after resolution of diverticulitis for colonoscopy Type 2 diabetes mellitus with complication, with long-term current use of insulin (HCC) Due for A1c today.   -Continue Jardiance, Ozempic, metformin Chronic kidney disease, unspecified CKD stage Was previously on lisinopril for renal protection,  however was stopped due to potential angioedema.  Patient is also on Jardiance, which should provide renal protection in setting of diabetes. Pt reports that his nephrologist agrees with this change.     Lincoln Brigham, MD [facility]

## 2023-08-04 NOTE — Patient Instructions (Signed)
Good to see you today - Thank you for coming in  Things we discussed today:  1) I am glad to hear that you are diverticulitis is improving. -Continue to take your doxycycline until you finish the entire course. -Continue to take psyllium husk and try to incorporate more fiber into your diet to help constipation and worsening of diverticulitis. -After your current infection improves, you should reach out to your gastroenterologist for a colonoscopy.  2) we will check your A1c and cholesterol level.

## 2023-08-05 LAB — LIPID PANEL
Chol/HDL Ratio: 2.8 {ratio} (ref 0.0–5.0)
Cholesterol, Total: 87 mg/dL — ABNORMAL LOW (ref 100–199)
HDL: 31 mg/dL — ABNORMAL LOW (ref >39)
LDL Chol Calc (NIH): 37 mg/dL (ref 0–99)
Triglycerides: 96 mg/dL (ref 0–149)
VLDL Cholesterol Cal: 19 mg/dL (ref 5–40)

## 2023-08-07 ENCOUNTER — Telehealth: Payer: Self-pay

## 2023-08-07 NOTE — Telephone Encounter (Signed)
See 2/21 patient message for details

## 2023-08-07 NOTE — Telephone Encounter (Signed)
MyChart message sent to patient to follow up on symptoms.

## 2023-08-07 NOTE — Telephone Encounter (Addendum)
-----   Message from Nurse [place] sent at 08/04/2023 10:15 AM EST ----- Regarding: symptom update. Reach out to patient for symptom update - Dr. Lavon Paganini

## 2023-08-11 ENCOUNTER — Telehealth: Payer: Self-pay

## 2023-08-11 DIAGNOSIS — K5792 Diverticulitis of intestine, part unspecified, without perforation or abscess without bleeding: Secondary | ICD-10-CM

## 2023-08-11 MED ORDER — SUFLAVE 178.7 G PO SOLR: 1.0000 | Freq: Once | ORAL | 0 refills | Status: AC

## 2023-08-11 NOTE — Telephone Encounter (Signed)
 Meredith Pel, NP to Me  08/10/23  2:55 PM Great news ! Thanks for checking on him. We can now get him scheduled for a colonoscopy sometime in late March if you wouldn't mind. He is [facility]'s patient. Can be done at [facility]. Thanks  Called and spoke with patient to schedule colonoscopy. Pt is scheduled for colonoscopy in the [facility] with Dr. Lavon Paganini on Friday, 09/11/23 at 1:30 pm. Pt knows that I will mail his instructions and send a copy via MyChart. Pt knows to expect a text from [facility] regarding his prep. Pt is a diabetic, but not on any blood thinners. Pt verbalized understanding and had no concerns at the end of the call.   Ambulatory referral to GI in epic.  Instructions mailed and sent via MyChart on 08/11/23.  SUFLAVE sent to [facility].

## 2023-08-11 NOTE — Telephone Encounter (Signed)
 See 08/11/23 telephone encounter

## 2023-08-12 NOTE — Progress Notes (Signed)
 Marland Kitchen

## 2023-08-25 NOTE — Telephone Encounter (Signed)
 Inbound call from patient requesting to speak with a nurse in regards to medications. Please advise.   Thank you

## 2023-08-25 NOTE — Telephone Encounter (Signed)
 The pt has been instructed on his Ozempic and Jardiance.  I did resend a copy to him to review. He received the message while we were speaking on the phone via My Chart and confirmed the instructions.  No further questions at this time.

## 2023-08-27 ENCOUNTER — Ambulatory Visit (INDEPENDENT_AMBULATORY_CARE_PROVIDER_SITE_OTHER): Payer: 59

## 2023-08-27 VITALS — Ht 71.0 in | Wt 238.0 lb

## 2023-08-27 DIAGNOSIS — Z0000 Encounter for general adult medical examination without abnormal findings: Secondary | ICD-10-CM

## 2023-08-27 NOTE — Patient Instructions (Addendum)
 [name] , Thank you for taking time to come for your Medicare Wellness Visit. I appreciate your ongoing commitment to your health goals. Please review the following plan we discussed and let me know if I can assist you in the future.   Referrals/Orders/Follow-Ups/Clinician Recommendations: Yes; Keep maintaining your health by keeping your appointments with Dr. Sherrilee Gilles and any specialists that you may see.  Call us if you need anything.  Have a great year!!!!  This is a list of the screening recommended for you and due dates:  Health Maintenance  Topic Date Due   Complete foot exam   12/11/2017   Yearly kidney health urinalysis for diabetes  07/25/2023   Hemoglobin A1C  02/01/2024   Eye exam for diabetics  03/30/2024   Yearly kidney function blood test for diabetes  07/28/2024   Medicare Annual Wellness Visit  08/26/2024   Colon Cancer Screening  10/07/2025   DTaP/Tdap/Td vaccine (2 - Td or Tdap) 02/20/2026   Pneumococcal Vaccination  Completed   Flu Shot  Completed   COVID-19 Vaccine  Completed   Hepatitis C Screening  Completed   HIV Screening  Completed   HPV Vaccine  Aged Out    Advanced directives: (Declined) Advance directive discussed with you today. Even though you declined this today, please call our office should you change your mind, and we can give you the proper paperwork for you to fill out.  Next Medicare Annual Wellness Visit scheduled for next year: Yes

## 2023-08-27 NOTE — Progress Notes (Signed)
 Subjective:   Marc Long is a 48 y.o. who presents for a Medicare Wellness preventive visit.  Visit Complete: Virtual I connected with  Grant Ruts on 08/27/23 by a audio enabled telemedicine application and verified that I am speaking with the correct person using two identifiers.  Patient Location: Home  Provider Location: Office/Clinic  I discussed the limitations of evaluation and management by telemedicine. The patient expressed understanding and agreed to proceed.  Vital Signs: Because this visit was a virtual/telehealth visit, some criteria may be missing or patient reported. Any vitals not documented were not able to be obtained and vitals that have been documented are patient reported.  VideoDeclined- This patient declined Librarian, academic. Therefore the visit was completed with audio only.  Persons Participating in Visit: Patient.  AWV Questionnaire: No: Patient Medicare AWV questionnaire was not completed prior to this visit.  Cardiac Risk Factors include: diabetes mellitus;dyslipidemia;family history of premature cardiovascular disease     Objective:    Today's Vitals   08/27/23 1422  Weight: 238 lb (108 kg)  Height: 5\' 11"  (1.803 m)  PainSc: 0-No pain   Body mass index is 33.19 kg/m.     08/27/2023    2:25 PM 04/13/2023    2:50 PM 01/08/2023    2:32 PM 11/03/2022    3:37 PM 10/21/2022    3:07 PM 09/01/2022   12:53 PM 08/22/2022    3:43 PM  Advanced Directives  Does Patient Have a Medical Advance Directive? No No No No No No No  Would patient like information on creating a medical advance directive? No - Patient declined No - Patient declined No - Patient declined No - Patient declined No - Patient declined No - Patient declined No - Patient declined    Current Medications (verified) Outpatient Encounter Medications as of 08/27/2023  Medication Sig   amoxicillin-clavulanate (AUGMENTIN) 875-125 MG tablet Take 1  tablet by mouth 2 (two) times daily. (Patient not taking: Reported on 07/31/2023)   atorvastatin (LIPITOR) 40 MG tablet Take 1 tablet (40 mg total) by mouth daily.   Blood Pressure Monitoring (COMFORT TOUCH BP CUFF/LARGE) MISC 1 each by Does not apply route once a week.   buPROPion (WELLBUTRIN XL) 300 MG 24 hr tablet Take 1 tablet (300 mg total) by mouth daily.   clonazePAM (KLONOPIN) 0.5 MG tablet Take by mouth.   dicyclomine (BENTYL) 10 MG capsule Take 1 capsule (10 mg total) by mouth 3 (three) times daily as needed for spasms.   diphenhydrAMINE (BENADRYL) 50 MG capsule Take 50 mg by mouth every 6 (six) hours as needed. (Patient not taking: Reported on 07/31/2023)   empagliflozin (JARDIANCE) 10 MG TABS tablet Take 1 tablet (10 mg total) by mouth daily.   fluvoxaMINE (LUVOX) 100 MG tablet    glucose blood (ACCU-CHEK GUIDE) test strip Please use to check blood sugar once daily.   metFORMIN (GLUCOPHAGE) 1000 MG tablet TAKE ONE TABLET BY MOUTH TWICE A DAY WITH A MEAL FOR DIABETES   Multiple Vitamin (MULTIVITAMIN WITH MINERALS) TABS tablet Take 1 tablet by mouth daily.   ondansetron (ZOFRAN) 4 MG tablet Take 1 tablet (4 mg total) by mouth every 8 (eight) hours as needed for nausea or vomiting.   paliperidone (INVEGA) 6 MG 24 hr tablet Take 6 mg by mouth 2 (two) times daily.   predniSONE (DELTASONE) 20 MG tablet Take 2 tablets daily with breakfast.   REXULTI 2 MG TABS tablet Take 2 mg by mouth  daily.   Semaglutide, 2 MG/DOSE, (OZEMPIC, 2 MG/DOSE,) 8 MG/3ML SOPN Inject 2 mg into the skin once a week.   sildenafil (VIAGRA) 25 MG tablet Take 1 tablet (25 mg total) by mouth daily as needed for erectile dysfunction.   No facility-administered encounter medications on file as of 08/27/2023.    Allergies (verified) Nabumetone, Fish allergy, Bee venom, Ciprofloxacin, Flagyl [metronidazole], Lisinopril, Shellfish allergy, and Cephalexin   History: Past Medical History:  Diagnosis Date   Depression     Diabetes mellitus due to underlying condition with unspecified complications (HCC) 11/19/2018   Diabetes mellitus without complication (HCC)    DM (diabetes mellitus), type 2, uncontrolled 12/12/2016   Gallstones    H/O: HTN (hypertension) 07/18/2021   Hyperglycemia due to type 2 diabetes mellitus (HCC) 12/12/2016   Obesity, Class III, BMI 40-49.9 (morbid obesity) (HCC)    No past surgical history on file. Family History  Problem Relation Age of Onset   Hypertension Mother    Depression Mother    Lymphoma Mother    Lymphoma Maternal Grandmother    Heart attack Maternal Grandfather    Diabetes Maternal Aunt    Cancer Maternal Aunt        multi myeloma   Colon cancer Neg Hx    Esophageal cancer Neg Hx    Social History   Socioeconomic History   Marital status: Single    Spouse name: Not on file   Number of children: 0   Years of education: Not on file   Highest education level: Bachelor's degree (e.g., BA, AB, BS)  Occupational History   Occupation: call center  Tobacco Use   Smoking status: Never    Passive exposure: Never   Smokeless tobacco: Never  Vaping Use   Vaping status: Never Used  Substance and Sexual Activity   Alcohol use: Not Currently    Comment: occ   Drug use: No   Sexual activity: Not Currently    Birth control/protection: None  Other Topics Concern   Not on file  Social History Narrative   Not on file   Social Drivers of Health   Financial Resource Strain: Low Risk  (08/27/2023)   Overall Financial Resource Strain (CARDIA)    Difficulty of Paying Living Expenses: Not hard at all  Food Insecurity: No Food Insecurity (08/27/2023)   Hunger Vital Sign    Worried About Running Out of Food in the Last Year: Never true    Ran Out of Food in the Last Year: Never true  Transportation Needs: No Transportation Needs (08/27/2023)   PRAPARE - Administrator, Civil Service (Medical): No    Lack of Transportation (Non-Medical): No  Physical Activity:  Insufficiently Active (07/16/2023)   Exercise Vital Sign    Days of Exercise per Week: 3 days    Minutes of Exercise per Session: 40 min  Stress: Stress Concern Present (08/27/2023)   [employer] of Occupational Health - Occupational Stress Questionnaire    Feeling of Stress : To some extent  Social Connections: Socially Isolated (08/27/2023)   Social Connection and Isolation Panel [NHANES]    Frequency of Communication with Friends and Family: More than three times a week    Frequency of Social Gatherings with Friends and Family: Twice a week    Attends Religious Services: Never    Database administrator or Organizations: No    Attends Banker Meetings: Never    Marital Status: Never married    Tobacco  Counseling Counseling given: Not Answered    Clinical Intake:  Pre-visit preparation completed: Yes  Pain : No/denies pain Pain Score: 0-No pain     BMI - recorded: 33.19 Nutritional Status: BMI > 30  Obese Nutritional Risks: None Diabetes: Yes CBG done?: No Did pt. bring in CBG monitor from home?: No  How often do you need to have someone help you when you read instructions, pamphlets, or other written materials from your doctor or pharmacy?: 1 - Never What is the last grade level you completed in school?: [employer] IN COMPUTER SCIENCE  Interpreter Needed?: No  Information entered by :: Baylyn Sickles N. Zniya Cottone, LPN.   Activities of Daily Living     08/27/2023    2:26 PM 09/01/2022   12:46 PM  In your present state of health, do you have any difficulty performing the following activities:  Hearing? 0 0  Vision? 0   Difficulty concentrating or making decisions? 0 0  Walking or climbing stairs? 0 0  Dressing or bathing? 0 0  Doing errands, shopping? 0 0  Preparing Food and eating ? N N  Using the Toilet? N N  In the past six months, have you accidently leaked urine? N N  Do you have problems with loss of bowel control? N N  Managing your  Medications? N N  Managing your Finances? N N  Housekeeping or managing your Housekeeping? N N    Patient Care Team: Lincoln Brigham, MD as PCP - General (Family Medicine) Lenoria Farrier, Amalia Hailey, MD as Referring Physician (Child and Adolescent Psychiatry) Norva Pavlov, OD as Consulting Physician (Optometry)  Indicate any recent Medical Services you may have received from other than [facility] providers in the past year (date may be approximate).     Assessment:   This is a routine wellness examination for Marc Long.  Hearing/Vision screen Hearing Screening - Comments:: Patient has some hearing difficulties in right ear.   Vision Screening - Comments:: Wears rx glasses - up to date with routine eye exams.    Goals Addressed   None    Depression Screen     08/27/2023    2:27 PM 08/04/2023    2:09 PM 07/16/2023    4:12 PM 04/13/2023    2:49 PM 01/08/2023    2:32 PM 11/03/2022    3:37 PM 10/21/2022    3:06 PM  PHQ 2/9 Scores  PHQ - 2 Score 5 5 6 6 6 6 5   PHQ- 9 Score 15 20 20 17 19 21 21     Fall Risk     08/27/2023    2:26 PM 11/03/2022    3:37 PM 10/21/2022    3:06 PM 09/01/2022   12:36 PM 08/22/2022    3:43 PM  Fall Risk   Falls in the past year? 0 0 0 0 0  Number falls in past yr: 0 0 0 0 0  Injury with Fall? 0 0 0 0 0  Risk for fall due to : No Fall Risks      Follow up Falls prevention discussed;Falls evaluation completed        MEDICARE RISK AT HOME:  Medicare Risk at Home Any stairs in or around the home?: No If so, are there any without handrails?: No Home free of loose throw rugs in walkways, pet beds, electrical cords, etc?: Yes Adequate lighting in your home to reduce risk of falls?: Yes Life alert?: No Use of a cane, walker or w/c?: No Grab bars in the  bathroom?: No Shower chair or bench in shower?: No Elevated toilet seat or a handicapped toilet?: No  TIMED UP AND GO:  Was the test performed?  No  Cognitive Function: 6CIT completed    12/25/2017    9:44 AM 12/18/2017     8:51 AM  MMSE - Mini Mental State Exam  Orientation to time    Orientation to Place    Registration    Attention/ Calculation    Recall    Language- name 2 objects    Language- repeat    Language- follow 3 step command    Language- read & follow direction    Write a sentence    Copy design    Total score       Information is confidential and restricted. Go to Review Flowsheets to unlock data.        08/27/2023    2:26 PM 09/01/2022   12:35 PM  6CIT Screen  What Year? 0 points 0 points  What month? 0 points 0 points  What time? 0 points 0 points  Count back from 20 0 points 0 points  Months in reverse 0 points 0 points  Repeat phrase 0 points 0 points  Total Score 0 points 0 points    Immunizations Immunization History  Administered Date(s) Administered   Influenza, Seasonal, Injecte, Preservative Fre 04/13/2023   Influenza,inj,Quad PF,6+ Mos 04/07/2018, 04/01/2021, 04/03/2022   Moderna SARS-COV2 Booster Vaccination 09/20/2019   Moderna Sars-Covid-2 Vaccination 10/18/2019   PFIZER(Purple Top)SARS-COV-2 Vaccination 06/11/2020   PNEUMOCOCCAL CONJUGATE-20 02/01/2021   Pfizer Covid-19 Vaccine Bivalent Booster 29yrs & up 04/18/2021   Pfizer(Comirnaty)Fall Seasonal Vaccine 12 years and older 05/13/2022, 04/13/2023   Pneumococcal Polysaccharide-23 12/06/2017   Tdap 02/21/2016    Screening Tests Health Maintenance  Topic Date Due   FOOT EXAM  12/11/2017   Diabetic kidney evaluation - Urine ACR  07/25/2023   HEMOGLOBIN A1C  02/01/2024   OPHTHALMOLOGY EXAM  03/30/2024   Diabetic kidney evaluation - eGFR measurement  07/28/2024   Medicare Annual Wellness (AWV)  08/26/2024   Colonoscopy  10/07/2025   DTaP/Tdap/Td (2 - Td or Tdap) 02/20/2026   Pneumococcal Vaccine 88-4 Years old  Completed   INFLUENZA VACCINE  Completed   COVID-19 Vaccine  Completed   Hepatitis C Screening  Completed   HIV Screening  Completed   HPV VACCINES  Aged Out    Health  Maintenance  Health Maintenance Due  Topic Date Due   FOOT EXAM  12/11/2017   Diabetic kidney evaluation - Urine ACR  07/25/2023   Health Maintenance Items Addressed: Patient is due for Diabetic Foot Exam and Diabetic kidney evaluation-Urine ACR.  Additional Screening:  Vision Screening: Recommended annual ophthalmology exams for early detection of glaucoma and other disorders of the eye.  Dental Screening: Recommended annual dental exams for proper oral hygiene  Community Resource Referral / Chronic Care Management: CRR required this visit?  No   CCM required this visit?  No     Plan:     I have personally reviewed and noted the following in the patient's chart:   Medical and social history Use of alcohol, tobacco or illicit drugs  Current medications and supplements including opioid prescriptions. Patient is not currently taking opioid prescriptions. Functional ability and status Nutritional status Physical activity Advanced directives List of other physicians Hospitalizations, surgeries, and ER visits in previous 12 months Vitals Screenings to include cognitive, depression, and falls Referrals and appointments  In addition, I have reviewed and  discussed with patient certain preventive protocols, quality metrics, and best practice recommendations. A written personalized care plan for preventive services as well as general preventive health recommendations were provided to patient.     Mickeal Needy, LPN   2/44/0102   After Visit Summary: (MyChart) Due to this being a telephonic visit, the after visit summary with patients personalized plan was offered to patient via MyChart   Notes: Please refer to Routing Comments.

## 2023-09-03 ENCOUNTER — Encounter: Payer: Self-pay | Admitting: Gastroenterology

## 2023-09-08 NOTE — Telephone Encounter (Signed)
 Marc Long is an SSRI. No indication for holding medication per [facility].  Called the patient. No answer. Left a message that the medication is okay to take the day of the procedure. It must be taken before 10:30 am the day of the procedure or after the procedure. He must maintain the fast for 3 hours prior to his 1:30 pm arrival time.

## 2023-09-08 NOTE — Telephone Encounter (Signed)
 Inbound call from patient wanting to inform that he has started Viibryd medication. Requesting a call to discuss medication instructions for 3/28 colonoscopy. Please advise, thank you.

## 2023-09-09 NOTE — Telephone Encounter (Signed)
 Spoke to patient. He indicates that information left on his voicemail yesterday regarding Viibryd answered all questions and he currently has no additional questions.

## 2023-09-11 ENCOUNTER — Ambulatory Visit: Payer: 59 | Admitting: Gastroenterology

## 2023-09-11 ENCOUNTER — Encounter: Payer: Self-pay | Admitting: Gastroenterology

## 2023-09-11 VITALS — BP 134/86 | HR 64 | Temp 98.1°F | Resp 15 | Ht 71.0 in | Wt 245.0 lb

## 2023-09-11 DIAGNOSIS — K648 Other hemorrhoids: Secondary | ICD-10-CM

## 2023-09-11 DIAGNOSIS — K5730 Diverticulosis of large intestine without perforation or abscess without bleeding: Secondary | ICD-10-CM

## 2023-09-11 DIAGNOSIS — K5792 Diverticulitis of intestine, part unspecified, without perforation or abscess without bleeding: Secondary | ICD-10-CM | POA: Diagnosis not present

## 2023-09-11 DIAGNOSIS — K644 Residual hemorrhoidal skin tags: Secondary | ICD-10-CM | POA: Diagnosis not present

## 2023-09-11 MED ORDER — DEXTROSE 5 % IV SOLN: INTRAVENOUS | Status: AC

## 2023-09-11 MED ORDER — SODIUM CHLORIDE 0.9 % IV SOLN
500.0000 mL | INTRAVENOUS | Status: AC
Start: 2023-09-11 — End: 2023-09-12

## 2023-09-11 NOTE — Progress Notes (Signed)
 [facility] History and Physical   Primary Care Physician:  Lincoln Brigham, MD   Reason for Procedure:  Follow up of diverticulitis, LLQ abd pain  Plan:    colonoscopy with possible interventions as needed     HPI: Marc Long is a very pleasant 48 y.o. male here for colonoscopy for follow up of diverticulitis.  The risks and benefits as well as alternatives of endoscopic procedure(s) have been discussed and reviewed. All questions answered. The patient agrees to proceed.    Past Medical History:  Diagnosis Date   Depression    Diabetes mellitus due to underlying condition with unspecified complications (HCC) 11/19/2018   Diabetes mellitus without complication (HCC)    DM (diabetes mellitus), type 2, uncontrolled 12/12/2016   Gallstones    H/O: HTN (hypertension) 07/18/2021   Hyperglycemia due to type 2 diabetes mellitus (HCC) 12/12/2016   Obesity, Class III, BMI 40-49.9 (morbid obesity) (HCC)     No past surgical history on file.  Prior to Admission medications   Medication Sig Start Date End Date Taking? Authorizing Provider  amoxicillin-clavulanate (AUGMENTIN) 875-125 MG tablet Take 1 tablet by mouth 2 (two) times daily. Patient not taking: Reported on 07/31/2023 07/18/23   Wallis Bamberg, PA-C  atorvastatin (LIPITOR) 40 MG tablet Take 1 tablet (40 mg total) by mouth daily. 04/01/23   Lincoln Brigham, MD  Blood Pressure Monitoring (COMFORT TOUCH BP CUFF/LARGE) MISC 1 each by Does not apply route once a week. 04/18/21   Cora Collum, DO  buPROPion (WELLBUTRIN XL) 300 MG 24 hr tablet Take 1 tablet (300 mg total) by mouth daily. 01/14/19   Malvin Johns, MD  clonazePAM (KLONOPIN) 0.5 MG tablet Take by mouth. 06/20/23   [provider]  dicyclomine (BENTYL) 10 MG capsule Take 1 capsule (10 mg total) by mouth 3 (three) times daily as needed for spasms. 07/31/23   Meredith Pel, NP  diphenhydrAMINE (BENADRYL) 50 MG capsule Take 50 mg by mouth every 6 (six) hours  as needed. Patient not taking: Reported on 07/31/2023    [provider]  empagliflozin (JARDIANCE) 10 MG TABS tablet Take 1 tablet (10 mg total) by mouth daily. 02/25/23   Lincoln Brigham, MD  fluvoxaMINE (LUVOX) 100 MG tablet     [provider]  glucose blood (ACCU-CHEK GUIDE) test strip Please use to check blood sugar once daily. 01/21/22   Littie Deeds, MD  metFORMIN (GLUCOPHAGE) 1000 MG tablet TAKE ONE TABLET BY MOUTH TWICE A DAY WITH A MEAL FOR DIABETES 11/11/22   Littie Deeds, MD  Multiple Vitamin (MULTIVITAMIN WITH MINERALS) TABS tablet Take 1 tablet by mouth daily.    [provider]  ondansetron (ZOFRAN) 4 MG tablet Take 1 tablet (4 mg total) by mouth every 8 (eight) hours as needed for nausea or vomiting. 11/03/22   Idalia Needle, Lucas Mallow, DO  paliperidone (INVEGA) 6 MG 24 hr tablet Take 6 mg by mouth 2 (two) times daily.    [provider]  predniSONE (DELTASONE) 20 MG tablet Take 2 tablets daily with breakfast. 07/18/23   Wallis Bamberg, PA-C  REXULTI 2 MG TABS tablet Take 2 mg by mouth daily. 03/26/22   [provider]  Semaglutide, 2 MG/DOSE, (OZEMPIC, 2 MG/DOSE,) 8 MG/3ML SOPN Inject 2 mg into the skin once a week. 04/28/23   Lincoln Brigham, MD  sildenafil (VIAGRA) 25 MG tablet Take 1 tablet (25 mg total) by mouth daily as needed for erectile dysfunction. 10/21/22   Cora Collum,  DO    Current Outpatient Medications  Medication Sig Dispense Refill   amoxicillin-clavulanate (AUGMENTIN) 875-125 MG tablet Take 1 tablet by mouth 2 (two) times daily. (Patient not taking: Reported on 07/31/2023) 14 tablet 0   atorvastatin (LIPITOR) 40 MG tablet Take 1 tablet (40 mg total) by mouth daily. 90 tablet 3   Blood Pressure Monitoring (COMFORT TOUCH BP CUFF/LARGE) MISC 1 each by Does not apply route once a week. 1 each 0   buPROPion (WELLBUTRIN XL) 300 MG 24 hr tablet Take 1 tablet (300 mg total) by mouth daily. 90 tablet 1   clonazePAM (KLONOPIN) 0.5 MG tablet Take  by mouth.     dicyclomine (BENTYL) 10 MG capsule Take 1 capsule (10 mg total) by mouth 3 (three) times daily as needed for spasms. 30 capsule 0   diphenhydrAMINE (BENADRYL) 50 MG capsule Take 50 mg by mouth every 6 (six) hours as needed. (Patient not taking: Reported on 07/31/2023)     empagliflozin (JARDIANCE) 10 MG TABS tablet Take 1 tablet (10 mg total) by mouth daily. 90 tablet 2   fluvoxaMINE (LUVOX) 100 MG tablet      glucose blood (ACCU-CHEK GUIDE) test strip Please use to check blood sugar once daily. 100 each 12   metFORMIN (GLUCOPHAGE) 1000 MG tablet TAKE ONE TABLET BY MOUTH TWICE A DAY WITH A MEAL FOR DIABETES 180 tablet 1   Multiple Vitamin (MULTIVITAMIN WITH MINERALS) TABS tablet Take 1 tablet by mouth daily.     ondansetron (ZOFRAN) 4 MG tablet Take 1 tablet (4 mg total) by mouth every 8 (eight) hours as needed for nausea or vomiting. 20 tablet 0   paliperidone (INVEGA) 6 MG 24 hr tablet Take 6 mg by mouth 2 (two) times daily.     predniSONE (DELTASONE) 20 MG tablet Take 2 tablets daily with breakfast. 10 tablet 0   REXULTI 2 MG TABS tablet Take 2 mg by mouth daily.     Semaglutide, 2 MG/DOSE, (OZEMPIC, 2 MG/DOSE,) 8 MG/3ML SOPN Inject 2 mg into the skin once a week. 3 mL 3   sildenafil (VIAGRA) 25 MG tablet Take 1 tablet (25 mg total) by mouth daily as needed for erectile dysfunction. 14 tablet 0   Current Facility-Administered Medications  Medication Dose Route Frequency Provider Last Rate Last Admin   0.9 %  sodium chloride infusion  500 mL Intravenous Continuous Davielle Lingelbach, Eleonore Chiquito, MD        Allergies as of 09/11/2023 - Review Complete 08/27/2023  Allergen Reaction Noted   Nabumetone Other (See Comments) and Nausea And Vomiting 08/25/2015   Fish allergy Nausea Only 10/29/2018   Bee venom Swelling 04/14/2016   Ciprofloxacin  07/31/2023   Flagyl [metronidazole]  07/31/2023   Lisinopril Swelling 07/18/2023   Shellfish allergy Nausea Only 04/14/2016   Cephalexin Nausea And  Vomiting 09/20/2012    Family History  Problem Relation Age of Onset   Hypertension Mother    Depression Mother    Lymphoma Mother    Lymphoma Maternal Grandmother    Heart attack Maternal Grandfather    Diabetes Maternal Aunt    Cancer Maternal Aunt        multi myeloma   Colon cancer Neg Hx    Esophageal cancer Neg Hx     Social History   Socioeconomic History   Marital status: Single    Spouse name: Not on file   Number of children: 0   Years of education: Not on file   Highest education  level: Bachelor's degree (e.g., BA, AB, BS)  Occupational History   Occupation: call center  Tobacco Use   Smoking status: Never    Passive exposure: Never   Smokeless tobacco: Never  Vaping Use   Vaping status: Never Used  Substance and Sexual Activity   Alcohol use: Not Currently    Comment: occ   Drug use: No   Sexual activity: Not Currently    Birth control/protection: None  Other Topics Concern   Not on file  Social History Narrative   Not on file   Social Drivers of Health   Financial Resource Strain: Low Risk  (08/27/2023)   Overall Financial Resource Strain (CARDIA)    Difficulty of Paying Living Expenses: Not hard at all  Food Insecurity: No Food Insecurity (08/27/2023)   Hunger Vital Sign    Worried About Running Out of Food in the Last Year: Never true    Ran Out of Food in the Last Year: Never true  Transportation Needs: No Transportation Needs (08/27/2023)   PRAPARE - Administrator, Civil Service (Medical): No    Lack of Transportation (Non-Medical): No  Physical Activity: Insufficiently Active (07/16/2023)   Exercise Vital Sign    Days of Exercise per Week: 3 days    Minutes of Exercise per Session: 40 min  Stress: Stress Concern Present (08/27/2023)   [employer] of Occupational Health - Occupational Stress Questionnaire    Feeling of Stress : To some extent  Social Connections: Socially Isolated (08/27/2023)   Social Connection and  Isolation Panel [NHANES]    Frequency of Communication with Friends and Family: More than three times a week    Frequency of Social Gatherings with Friends and Family: Twice a week    Attends Religious Services: Never    Database administrator or Organizations: No    Attends Banker Meetings: Never    Marital Status: Never married  Intimate Partner Violence: Not At Risk (08/27/2023)   Humiliation, Afraid, Rape, and Kick questionnaire    Fear of Current or Ex-Partner: No    Emotionally Abused: No    Physically Abused: No    Sexually Abused: No    Review of Systems:  All other review of systems negative except as mentioned in the HPI.  Physical Exam: Vital signs in last 24 hours: BP 129/86   Pulse 68   Temp 98.1 F (36.7 C) (Temporal)   Ht 5\' 11"  (1.803 m)   Wt 245 lb (111.1 kg)   SpO2 98%   BMI 34.17 kg/m  General:   Alert, NAD Lungs:  Clear .   Heart:  Regular rate and rhythm Abdomen:  Soft, nontender and nondistended. Neuro/Psych:  Alert and cooperative. Normal mood and affect. A and O x 3  Reviewed labs, radiology imaging, old records and pertinent past GI work up  Patient is appropriate for planned procedure(s) and anesthesia in an ambulatory setting   K. Scherry Ran , MD [phone]

## 2023-09-11 NOTE — Progress Notes (Signed)
 A/o x 3, VSS, gd SR's, pleased with anesthesia, report to RN

## 2023-09-11 NOTE — Patient Instructions (Signed)
 YOU HAD AN ENDOSCOPIC PROCEDURE TODAY AT THE [facility]:   Refer to the procedure report that was given to you for any specific questions about what was found during the examination.  If the procedure report does not answer your questions, please call your gastroenterologist to clarify.  If you requested that your care partner not be given the details of your procedure findings, then the procedure report has been included in a sealed envelope for you to review at your convenience later.  YOU SHOULD EXPECT: Some feelings of bloating in the abdomen. Passage of more gas than usual.  Walking can help get rid of the air that was put into your GI tract during the procedure and reduce the bloating. If you had a lower endoscopy (such as a colonoscopy or flexible sigmoidoscopy) you may notice spotting of blood in your stool or on the toilet paper. If you underwent a bowel prep for your procedure, you may not have a normal bowel movement for a few days.  Please Note:  You might notice some irritation and congestion in your nose or some drainage.  This is from the oxygen used during your procedure.  There is no need for concern and it should clear up in a day or so.  SYMPTOMS TO REPORT IMMEDIATELY:  Following lower endoscopy (colonoscopy or flexible sigmoidoscopy):  Excessive amounts of blood in the stool  Significant tenderness or worsening of abdominal pains  Swelling of the abdomen that is new, acute  Fever of 100F or higher   For urgent or emergent issues, a gastroenterologist can be reached at any hour by calling [phone]. Do not use MyChart messaging for urgent concerns.    DIET:  We do recommend a small meal at first, but then you may proceed to your regular diet.  Drink plenty of fluids but you should avoid alcoholic beverages for 24 hours.  MEDICATIONS: Continue present medications.  Please see handouts given to you by your recovery nurse: Diverticulosis,  Hemmorhoids.  FOLLOW UP: Repeat colonoscopy in 10 years for surveillance.  Thank you for allowing [place] to provide for your healthcare needs today.  ACTIVITY:  You should plan to take it easy for the rest of today and you should NOT DRIVE or use heavy machinery until tomorrow (because of the sedation medicines used during the test).    FOLLOW UP: Our staff will call the number listed on your records the next business day following your procedure.  We will call around 7:15- 8:00 am to check on you and address any questions or concerns that you may have regarding the information given to you following your procedure. If we do not reach you, we will leave a message.     If any biopsies were taken you will be contacted by phone or by letter within the next 1-3 weeks.  Please call us at [phone] if you have not heard about the biopsies in 3 weeks.    SIGNATURES/CONFIDENTIALITY: You and/or your care partner have signed paperwork which will be entered into your electronic medical record.  These signatures attest to the fact that that the information above on your After Visit Summary has been reviewed and is understood.  Full responsibility of the confidentiality of this discharge information lies with you and/or your care-partner.

## 2023-09-11 NOTE — Op Note (Signed)
 [facility] Patient Name: Marc Long Procedure Date: 09/11/2023 2:20 PM MRN: [id number] Endoscopist: Napoleon Form , MD, [id number] Age: 48 Referring MD:  Date of Birth: April 22, 1976 Gender: Male Account #: [id number] Procedure:                Colonoscopy Indications:              Follow-up of diverticulitis Medicines:                Monitored Anesthesia Care Procedure:                Pre-Anesthesia Assessment:                           - Prior to the procedure, a History and Physical                            was performed, and patient medications and                            allergies were reviewed. The patient's tolerance of                            previous anesthesia was also reviewed. The risks                            and benefits of the procedure and the sedation                            options and risks were discussed with the patient.                            All questions were answered, and informed consent                            was obtained. Prior Anticoagulants: The patient has                            taken no anticoagulant or antiplatelet agents. ASA                            Grade Assessment: II - A patient with mild systemic                            disease. After reviewing the risks and benefits,                            the patient was deemed in satisfactory condition to                            undergo the procedure.                           After obtaining informed consent, the colonoscope  was passed under direct vision. Throughout the                            procedure, the patient's blood pressure, pulse, and                            oxygen saturations were monitored continuously. The                            Olympus Scope SN: [id number] was introduced through                            the anus and advanced to the the cecum, identified                            by appendiceal orifice and  ileocecal valve. The                            colonoscopy was performed without difficulty. The                            patient tolerated the procedure well. The quality                            of the bowel preparation was good. The ileocecal                            valve, appendiceal orifice, and rectum were                            photographed. Scope In: 2:34:36 PM Scope Out: 2:49:01 PM Scope Withdrawal Time: 0 hours 8 minutes 0 seconds  Total Procedure Duration: 0 hours 14 minutes 25 seconds  Findings:                 The perianal and digital rectal examinations were                            normal.                           Scattered medium-mouthed and small-mouthed                            diverticula were found in the sigmoid colon,                            descending colon, transverse colon and ascending                            colon. There was narrowing of the colon in                            association with the diverticular opening. There  was evidence of diverticular spasm.                            Peri-diverticular erythema was seen. There was                            evidence of an impacted diverticulum.                           Non-bleeding external and internal hemorrhoids were                            found during retroflexion. The hemorrhoids were                            medium-sized. Complications:            No immediate complications. Estimated Blood Loss:     Estimated blood loss was minimal. Impression:               - Moderate diverticulosis in the sigmoid colon, in                            the descending colon, in the transverse colon and                            in the ascending colon. There was narrowing of the                            colon in association with the diverticular opening.                            There was evidence of diverticular spasm.                             Peri-diverticular erythema was seen. There was                            evidence of an impacted diverticulum.                           - Non-bleeding external and internal hemorrhoids.                           - No specimens collected. Recommendation:           - Patient has a contact number available for                            emergencies. The signs and symptoms of potential                            delayed complications were discussed with the                            patient. Return to normal activities tomorrow.  Written discharge instructions were provided to the                            patient.                           - Resume previous diet.                           - Continue present medications.                           - Await pathology results.                           - Repeat colonoscopy in 10 years for surveillance. Napoleon Form, MD 09/11/2023 2:55:14 PM This report has been signed electronically.

## 2023-09-14 ENCOUNTER — Telehealth: Payer: Self-pay

## 2023-09-14 NOTE — Telephone Encounter (Signed)
  Follow up Call-     09/11/2023    1:37 PM  Call back number  Post procedure Call Back phone  # [phone]  Permission to leave phone message Yes     Patient questions:  Do you have a fever, pain , or abdominal swelling? No. Pain Score  0 *  Have you tolerated food without any problems? Yes.    Have you been able to return to your normal activities? Yes.    Do you have any questions about your discharge instructions: Diet   No. Medications  No. Follow up visit  No.  Do you have questions or concerns about your Care? No.  Actions: * If pain score is 4 or above: No action needed, pain <4.

## 2023-09-21 ENCOUNTER — Other Ambulatory Visit: Payer: Self-pay | Admitting: Student

## 2023-09-22 ENCOUNTER — Other Ambulatory Visit: Payer: Self-pay

## 2023-09-22 DIAGNOSIS — E118 Type 2 diabetes mellitus with unspecified complications: Secondary | ICD-10-CM

## 2023-09-22 MED ORDER — METFORMIN HCL 1000 MG PO TABS: 1000.0000 mg | ORAL_TABLET | Freq: Two times a day (BID) | ORAL | 1 refills | Status: DC

## 2023-09-28 ENCOUNTER — Encounter: Payer: Self-pay | Admitting: Family Medicine

## 2023-09-28 ENCOUNTER — Ambulatory Visit: Admitting: Family Medicine

## 2023-09-28 VITALS — BP 109/80 | HR 78 | Ht 71.0 in | Wt 248.0 lb

## 2023-09-28 DIAGNOSIS — M545 Low back pain, unspecified: Secondary | ICD-10-CM | POA: Diagnosis not present

## 2023-09-28 DIAGNOSIS — R45851 Suicidal ideations: Secondary | ICD-10-CM

## 2023-09-28 MED ORDER — CYCLOBENZAPRINE HCL 5 MG PO TABS: 5.0000 mg | ORAL_TABLET | Freq: Three times a day (TID) | ORAL | 1 refills | Status: DC | PRN

## 2023-09-28 MED ORDER — DICLOFENAC SODIUM 1 % EX GEL
2.0000 g | Freq: Four times a day (QID) | CUTANEOUS | 1 refills | Status: AC | PRN
Start: 2023-09-28 — End: ?

## 2023-09-28 NOTE — Patient Instructions (Addendum)
 Good to see you today - Thank you for coming in  Things we discussed today: Your back pain is likely due to muscle strain.  Take Flexeril as needed, but it will make you sleepy so do not drive while you are taking it.  You can also apply Voltaren gel to your back where it hurts.  I have given you some lidocaine patch samples to try.  If it does not improve at all over the next week let us know.  Follow-up with your psychiatrist  Please go to as needed [facility] [address]  [place], [place] [address] [phone]  Suicide hotline: 52  Advocacy/Legal Legal Aid [address]:  680-303-2854  /  [phone] /  LVM, taking clients  Family Justice Center:  (614) [phone] /  Onsite, counseling with Johny Shears is virtual, Accepting new clients   Family Service of the [employer] 24-hr Crisis line:  316-678-2957 Virtual & Onsite services (Client preference), Accepting New clients  [employer], [place]:  254-750-4692 Virtual & Onsite services (Client preference), Accepting New clients  Court Watch (custody):  [phone] Virtual, Accepting new clients  [employer] [facility]:   ([phone] Virtual/Telephone, accepting clients for waitlisting (time depends on services)   Baby & Breastfeeding [place] [facility] [phone] Outpatient consultant out for weeks (will be hard to get an appointment) , Support group offered Virtually (Accepting new members)  [facility] Lactation [phone] Telephone & Onsite services (Client preference), Accepting New clients  WIC: [phone] ([place]);  ([phone] ([place]) Virtual  [place] League:  [phone]     Childcare [place] [facility]: 314-011-4783 ([place]) / ([phone] ([place])             - Child Care Resources/ Referrals/ Scholarships             - Head Start/ Early Head Start (call or apply online) Virtually (by appointment), Accepting new families  [place] DHHS: [place] Pre-K :  [phone] / [phone]      Employment / Job Search [employer] of [place]: 848-066-5468 / [address] Human resources officer (Client preference), Accepting New clients  [employer] ([employer]Link): ([phone] 873-7075 ([place]) / ([phone] ([place]) Virtual & Onsite workshops, Accepting new clients  [employer] laboratory technician Resource/ Career Center: [phone] / [phone] Virtual & Onsite , Accepting New clients  [facility] Job & Career Center: [phone]   DHHS Work First: 980-595-3308 ([place]) / 515-219-0750 ([place]) Virtually, Accepting clients   [facility] [place]:  904-251-2115  Virtual and Onsite, Accepting new clients     Financial Assistance [facility]:  918-477-6566 Virtual (financial assistance) & Onsite (all other services), Accepting new families  [employer]: (780)136-2168 [employer] (furniture):  (928) 663-3737   [facility] Helping Hands: 770-502-5599   Low Income Energy Assistance: 775-250-2526 Virtual, accepting new families    Food Assistance DHHS- SNAP/ Food Stamps: 312-874-0886 Virtual  WIC: [place](303)358-2253 ;  [place] 530-196-2318 Virtual        During the summer, text "FOOD" to 812751     General Health / Clinics (Adults) Orange Card (for Adults) through [facility]: [phone]    [facility]:   [phone]   [facility] Health & Wellness:   [phone]   Health Department:  [phone]   [facility]:  ([phone]) [phone] / [phone]   [facility] of [place]:   779-693-6660 Onsite, Accepting new patients  [facility]:   [phone] x 623-835-2002 Onsite ,  Accepting new patients    Housing [facility] Housing Coalition:   319-163-4821   [facility] Housing Authority:  714-777-4225   Affordable Housing Managemnt:  [phone]     Immigrant/ Refugee Center for [facility]):  [phone] Onsite, Accepting new people  [employer]  House:  276-517-8143 Virtual, accepting new individuals  [employer] [facility]:  516-239-2536 Onsite & Virtual, Accepting new individuals  [facility] Services:  515-681-4089 Virtual, Accepting new clients  African Services Coalition:  705-756-0418     LGBTQ Youth SAFE  www.youthsafe[place].org  Virtual, Accepting new members  PFLAG  (220) 430-9237 / info@[place] .org  Virtual, Accepting new Members  [facility]:  985-798-4013  Virtual    Mental Health/ Substance Use Family Service of the [facility]  [phone] [address] [place]y [place] [phone] Virtual & Onsite services (Client preference), Accepting New clients  [facility]:  (331)[phone] or [phone] [address] [place], [place] [address] Onsite & Virtual, Accepting new clients  [facility]:  [phone] [address] [place], [place] [address] Virtual & Onsite, Accepting new clients  [facility]:  ([phone] Rozanna Boer [place], [place]) [address] [place], [place] [address] Onsite & Virtual, Accepting new clients  [place] (walk-ins)  612-591-1635 / [address]   [place]:  [phone] Virtual meetings via Zoom- need meeting passcode- call 7731540471 to receive code "AFG"= [place]-Anon Family Group  [place].[email or web address]/find-meetings   Alcoholics Anonymous:  418-457-6250 TonerProviders.com.cy  Narcotics Anonymous:  (910)277-4116 24 hour helpline: 770-629-1356  Quit Smoking Hotline:  800-QUIT-NOW 346-827-5070)    [facility]                                              [phone] [address] [place], [place], [place] [address] Onsite & Virtual, Accepting new clients    COUNSELING AGENCIES in [place] (Accepting Medicaid)   Mental Health  (* = Spanish available;  + = Psychiatric services) * Family Service of the [place]                                [phone] [address], [place], [place] [address] Virtual & Onsite services (Client preference),  Accepting New clients  *+ [place] [facility]:                                        [phone] or 1-539 744 2938 Virtual & Onsite, Accepting clients  +[facility] Total Access Care                                [phone]   [facility]:                                                 ([phone]   + [facility]:  737-431-2913 Onsite & Virtual, Accepting new clients  [facility]                               ([phone] Onsite, Accepting new clients  * Family Solutions:                                                     4401047942 Virtual, NOT accepting new clients  The Social Emotional Learning (SEL) Group           [phone] Virtual, accepting new clients  Youth Focus:                                                            [phone] Onsite & Virtual, Accepting new clients  [facility]:                                        ([phone] Onsite & Virtual, Waitlist 6-8 months for services  [facility] [facility]:                             [phone]   *[facility]                                                [phone] Onsite & Virtual, Accepting new clients  + [facility]:             828-694-1648 or ([phone]   [facility]                                                    [phone] Onsite & Virtual, Accepting new clients  *+ [place] (walk-ins)                                                ([phone] / [address]   [facility]                                              [phone] Onsite & Virtual, Accepting new clients  Youth Unlimited (PCIT)                                              605-230-7488 Onsite & Virtual,  At Capacity (check in occasionally , subject to change)    Substance Use [place]:                                828-630-2094  Alcoholics Anonymous:      9183300600   Narcotics Anonymous:       579-109-0842  Quit Smoking Hotline:         800-QUIT-NOW 2232404242[phone] Provides information on mental health, intellectual/developmental disabilities & substance abuse services in [facility]     Parenting Children's Home Society:  318-580-8717 Virtual , Accepting families  YWCA: 469-163-5030   [employer]CG: Bringing Out the Best:  [phone]              Thriving at Three (Hispanic families): (941)165-6163 Onsite, Accepting new children ( short wait list)  Healthy Start (Family Service of the [place]):  941-880-8192 [phone]   Parents as Teachers:  630-791-7750 Virtually, accepting families ( waitlist for Spanish speaking families )  [place] Child Counsellor- Learning Together (Immigrants): 417-625-5008     Poison Control 306-707-4630   Sports & Recreation [employer] Open Doors Application: https://www.rich.com/ Onsite, Accepting new families  [place] of [place] Recreation Centers: http://www.[place].gov/index.aspx?page=3615 Onsite    Special Needs Family Support Network:  340-404-6783 Virtual, Accepting new families  Autism Society of [place]:   205-315-1012 416-669-4698 or (626)210-5879 /  [phone] Virtual, Accepting new families   [facility]:  (504)430-2814 Virtual, Accepting families  ARC of [place]:  325 778 6969 Virtual, Accepting new families  Children's Developmental Service Agency (CDSA):  662-225-2116 Virtual, Accepting new families  CC4C (Care Coordination for Children):  5152552644 Virtual, accepting new patients     Transportation Medicaid Transportation: 804-023-4693 to apply  [place] [employer] Authority: 404-596-2982 (reduced-fare bus ID to Medicaid/ Medicare/ Orange Card)  SCAT Paratransit services: Eligible riders only, call (209) [phone] for application    Tutoring/ Mentoring [employer]: 205-323-2021 No tutoring only afterschool programming (In Person),  Accepting new students  Big Brothers/ Big Sisters: 309-281-0745 Daine Drummer)  (416) [phone] ([place])   ACES through child's school: 669-851-8991   [employer] Achievers: contact your local Y In Person, Accepting New students  SHIELD Mentor Program: 339-120-2848 Will re-launch in the fall   Updated 09/2019  .

## 2023-09-28 NOTE — Progress Notes (Signed)
    SUBJECTIVE:   CHIEF COMPLAINT / HPI:   Dull back pain x1 week that began before going to gym Lower left down leg to knee Has taken Tylenol with no improvement, IcyHot, heat, ice help No injury Waxing and waning Similar pain a long time ago No red flag symptoms including bowel or bladder incontinence, leg numbness, fever, night pain, weight loss, rash No history of cancer, immunocompromise, IV drug use, steroid use  Recently started Viibryd 1 month ago per psychiatry, follows closely with them  SI at baseline, plan would be to overdose on metformin Hx suicide attempt 5 years ago, multiple other attempts   PERTINENT  PMH / PSH: Hypertension, type 2 diabetes, suicidal ideation, severe bipolar 1 disorder, schizoaffective  OBJECTIVE:   BP 109/80   Pulse 78   Ht 5\' 11"  (1.803 m)   Wt 248 lb (112.5 kg)   SpO2 99%   BMI 34.59 kg/m       09/28/2023    2:07 PM 08/27/2023    2:27 PM 08/04/2023    2:09 PM 07/16/2023    4:12 PM 04/13/2023    2:49 PM  Depression screen PHQ 2/9  Decreased Interest 1 2 2 3 3   Down, Depressed, Hopeless 3 3 3 3 3   PHQ - 2 Score 4 5 5 6 6   Altered sleeping 3 1 3 3 2   Tired, decreased energy 3 3 3 3 2   Change in appetite 0 0 0 0 0  Feeling bad or failure about yourself  3 3 3 3 3   Trouble concentrating 0 0 0 0 0  Moving slowly or fidgety/restless 0 0 3 2 1   Suicidal thoughts 3 3 3 3 3   PHQ-9 Score 16 15 20 20 17   Difficult doing work/chores  Somewhat difficult Very difficult  Very difficult   Back - Normal skin, Spine with normal alignment and no deformity.  No tenderness to vertebral process palpation.  No paraspinal tenderness to palpation.  Ambulates independently without difficulty.  No focal neurological deficit.  Small lipoma to lower midline of back Psych: Flat affect  ASSESSMENT/PLAN:   Assessment & Plan Acute left-sided low back pain, unspecified whether sciatica present The most likely diagnosis is musculoskeletal strain.  Likely  obtained while working out at [employer]  I have considered and concluded the patient has a very low likelihood of having a Bone tumor; Fracture: Aortic aneurysm: Disk infection: or Pyelonephritis: Tylenol not working for him and he cannot take NSAIDs due to history of GI bleed with NSAIDs - Flexeril 5 mg as needed for pain, advised to not drive while taking this as it can make him sleepy - Voltaren gel as needed, provided with lidocaine patches as well every 12 hours - Advised to return if symptoms persist or worsen Passive suicidal ideations Patient states that he has passive suicidal ideation with a plan but no intent at baseline.  Follows closely with psychiatry.  Mom present in the room and states that she does not have any concern for patient's wellbeing and that he is at his baseline at the moment.  Given that patient has no intent and this is a chronic issue for which he follows with psychiatry closely, do not need to send to [facility] at this time.  Advised to follow-up with psychiatrist   Sarahann Cumins, DO [facility]

## 2023-09-30 ENCOUNTER — Other Ambulatory Visit: Payer: Self-pay | Admitting: Family Medicine

## 2023-09-30 DIAGNOSIS — E118 Type 2 diabetes mellitus with unspecified complications: Secondary | ICD-10-CM

## 2023-10-27 ENCOUNTER — Other Ambulatory Visit: Payer: Self-pay | Admitting: Family Medicine

## 2023-10-27 DIAGNOSIS — E118 Type 2 diabetes mellitus with unspecified complications: Secondary | ICD-10-CM

## 2023-11-25 ENCOUNTER — Other Ambulatory Visit: Payer: Self-pay | Admitting: Family Medicine

## 2023-11-25 DIAGNOSIS — E118 Type 2 diabetes mellitus with unspecified complications: Secondary | ICD-10-CM

## 2023-12-07 ENCOUNTER — Ambulatory Visit (INDEPENDENT_AMBULATORY_CARE_PROVIDER_SITE_OTHER): Admitting: Family Medicine

## 2023-12-07 ENCOUNTER — Other Ambulatory Visit: Payer: Self-pay | Admitting: Family Medicine

## 2023-12-07 VITALS — BP 119/95 | HR 91 | Temp 98.2°F | Ht 71.0 in | Wt 250.2 lb

## 2023-12-07 DIAGNOSIS — K5792 Diverticulitis of intestine, part unspecified, without perforation or abscess without bleeding: Secondary | ICD-10-CM

## 2023-12-07 MED ORDER — AMOXICILLIN-POT CLAVULANATE ER 1000-62.5 MG PO TB12: 2.0000 | ORAL_TABLET | Freq: Two times a day (BID) | ORAL | 0 refills | Status: DC

## 2023-12-07 NOTE — Patient Instructions (Addendum)
 It was great to see you! Thank you for allowing me to participate in your care!  Our plans for today:  - I have sent the antibiotic Augmentin  to your pharmacy, the instructions on how to take it a bottle.  You will take this twice daily for 7 days. - Please make an appointment at the checkout desk for Thursday or Friday to follow-up and see how you are doing. - If you have any worsening fevers, severe abdominal pain please seek care immediately.   Please arrive 15 minutes PRIOR to your next scheduled appointment time! If you do not, this affects OTHER patients' care.  Take care and seek immediate care sooner if you develop any concerns.   Ozell Provencal, MD, PGY-2 [facility] 2:00 PM 12/07/2023  [facility]

## 2023-12-07 NOTE — Progress Notes (Signed)
    SUBJECTIVE:   CHIEF COMPLAINT / HPI: Stomach issues  Problems with stomach Started 2-3 days  Lower stomach on the left side. Pain feels the same as previous diverticulitis Had a fever of 100 on Saturday. No cough runny, nose, chest pain, or SOB Stools have been small and oily But did not float in the toilet Last bowel movement was several hours ago, this was regular. No vomiting or nausea. Eating and drinking okay. Pain does not change with eating. Pain is intermittent No obvious aggravating factors Lay on side or stomach can improve pain  PERTINENT  PMH / PSH: Gallstones, history of diverticulitis, type 2 diabetes, CKD 3, schizophrenia  OBJECTIVE:   BP (!) 119/95   Pulse 91   Temp 98.2 F (36.8 C) (Oral)   Ht 5' 11 (1.803 m)   Wt 250 lb 4 oz (113.5 kg)   SpO2 97%   BMI 34.90 kg/m   General: NAD  Neuro: A&O Cardiovascular: RRR, no murmurs, no peripheral edema Respiratory: normal WOB on RA, CTAB, no wheezes, ronchi or rales Abdomen: soft, mildly tender to palpation of the left lower quadrant, no rebound or guarding Extremities: Moving all 4 extremities equally   ASSESSMENT/PLAN:   Assessment & Plan Diverticulitis Hemodynamically stable, benign abdominal exam.  Clinical history is not consistent with acute appendicitis, cholecystitis, PUD, dyspepsia, GERD, pancreatitis.  Will proceed with outpatient treatment with close follow-up.  Augmentin  2000-113 mg twice daily for 7 days sent to patient's pharmacy.  Return in about 3 days (around 12/10/2023) for F/u.  Ozell Provencal, MD [facility]

## 2023-12-11 ENCOUNTER — Ambulatory Visit (INDEPENDENT_AMBULATORY_CARE_PROVIDER_SITE_OTHER): Admitting: Family Medicine

## 2023-12-11 ENCOUNTER — Encounter: Payer: Self-pay | Admitting: Family Medicine

## 2023-12-11 VITALS — BP 120/78 | HR 85 | Wt 253.0 lb

## 2023-12-11 DIAGNOSIS — F332 Major depressive disorder, recurrent severe without psychotic features: Secondary | ICD-10-CM

## 2023-12-11 DIAGNOSIS — K5792 Diverticulitis of intestine, part unspecified, without perforation or abscess without bleeding: Secondary | ICD-10-CM | POA: Diagnosis not present

## 2023-12-11 NOTE — Patient Instructions (Signed)
 Good to see you today - Thank you for coming in  Things we discussed today:  1) Your symptoms are improving! Continue taking the full antibiotic course for the full 7 days.  2) To decrease the risk having more flares in the future, I recommend starting a daily fiber supplement.  - Also try to incorporate more fiber into your diet such as fruits, vegetables, and whole grain foods.

## 2023-12-11 NOTE — Progress Notes (Unsigned)
    SUBJECTIVE:   CHIEF COMPLAINT / HPI:   JH is a 48yo M w/ hx of diverticulosis that pf diverticulitis f/u - Seen 6/23 for diverticulitis, was started on 7day of Augmentin  BID.  - Reports pain is improving.  - Normal BM's, no blood. No vomiting - Normal appetite. - Is not currently taking      OBJECTIVE:   There were no vitals taken for this visit.  ***  ASSESSMENT/PLAN:   Assessment & Plan      Twyla Nearing, MD [facility]

## 2024-03-15 ENCOUNTER — Other Ambulatory Visit: Payer: Self-pay | Admitting: Family Medicine

## 2024-03-15 DIAGNOSIS — E118 Type 2 diabetes mellitus with unspecified complications: Secondary | ICD-10-CM

## 2024-03-21 ENCOUNTER — Other Ambulatory Visit: Payer: Self-pay

## 2024-03-21 DIAGNOSIS — E118 Type 2 diabetes mellitus with unspecified complications: Secondary | ICD-10-CM

## 2024-03-21 MED ORDER — OZEMPIC (2 MG/DOSE) 8 MG/3ML ~~LOC~~ SOPN: 2.0000 mg | PEN_INJECTOR | SUBCUTANEOUS | 3 refills | Status: DC

## 2024-03-31 ENCOUNTER — Other Ambulatory Visit: Payer: Self-pay | Admitting: Family Medicine

## 2024-03-31 DIAGNOSIS — E781 Pure hyperglyceridemia: Secondary | ICD-10-CM

## 2024-04-01 ENCOUNTER — Other Ambulatory Visit: Payer: Self-pay

## 2024-04-01 DIAGNOSIS — E118 Type 2 diabetes mellitus with unspecified complications: Secondary | ICD-10-CM

## 2024-04-01 MED ORDER — OZEMPIC (2 MG/DOSE) 8 MG/3ML ~~LOC~~ SOPN: 2.0000 mg | PEN_INJECTOR | SUBCUTANEOUS | 3 refills | Status: DC

## 2024-04-12 ENCOUNTER — Ambulatory Visit: Admitting: Family Medicine

## 2024-04-12 ENCOUNTER — Encounter: Payer: Self-pay | Admitting: Family Medicine

## 2024-04-12 VITALS — BP 121/89 | HR 78 | Ht 71.0 in | Wt 258.5 lb

## 2024-04-12 DIAGNOSIS — E119 Type 2 diabetes mellitus without complications: Secondary | ICD-10-CM | POA: Diagnosis not present

## 2024-04-12 DIAGNOSIS — Z23 Encounter for immunization: Secondary | ICD-10-CM

## 2024-04-12 DIAGNOSIS — M545 Low back pain, unspecified: Secondary | ICD-10-CM

## 2024-04-12 DIAGNOSIS — G479 Sleep disorder, unspecified: Secondary | ICD-10-CM

## 2024-04-12 DIAGNOSIS — R45851 Suicidal ideations: Secondary | ICD-10-CM

## 2024-04-12 DIAGNOSIS — G8929 Other chronic pain: Secondary | ICD-10-CM

## 2024-04-12 LAB — POCT GLYCOSYLATED HEMOGLOBIN (HGB A1C): HbA1c, POC (controlled diabetic range): 5.6 % (ref 0.0–7.0)

## 2024-04-12 MED ORDER — METHOCARBAMOL 500 MG PO TABS: 500.0000 mg | ORAL_TABLET | Freq: Four times a day (QID) | ORAL | 0 refills | Status: AC | PRN

## 2024-04-12 NOTE — Patient Instructions (Signed)
 Try Robaxin  as prescribed for low back pain. Continue exercising and using lidocaine  patches as needed. Let me know if interested in physical therapy  Continue to follow up closely with your psychiatry team  Your A1c is great. Continue all current medications

## 2024-04-12 NOTE — Assessment & Plan Note (Signed)
 Well controlled A1c as above Continue current medications UACR today

## 2024-04-12 NOTE — Progress Notes (Signed)
 SUBJECTIVE:   CHIEF COMPLAINT / HPI:   T2DM -Current medication regimen: Jardiance  10 mg daily, metformin  1000 mg twice daily, Ozempic  2 mg weekly.  Also on Lipitor 40 mg daily -Denies polyuria, polydipsia, abdominal pain, chest pain, shortness of breath -Foot exam: today, denies numbness or wounds -Eye exam: plans to schedule  Lab Results  Component Value Date   HGBA1C 5.6 04/12/2024   HGBA1C 5.6 08/04/2023   HGBA1C 5.5 04/13/2023        Discussed the use of AI scribe software for clinical note transcription with the patient, who gave verbal consent to proceed.  History of Present Illness Marc Long is a 48 year old male who presents with low back pain.  Low back pain - Intermittent pain localized to the lower right side of the back - History of back injury several years ago - Tylenol  and lidocaine  patches have been ineffective for pain relief - Previous use of Flexeril ; effectiveness unknown but would like to try a different muscle relaxer - No physical therapy attempted - Performs exercises with a band and works out regularly - No numbness, weakness, or radicular pain in the legs - No bowel or bladder dysfunction, saddle anesthesia  Sleep disturbance - Early morning awakening between 4:30 to 5:30 AM despite falling asleep at 11 PM - Duration of sleep disturbance is two weeks - Melatonin 10 mg has not improved sleep - No recent mood changes, but ongoing mood issues are present  Suicidal ideation and mood symptoms - Suicidal thoughts occurring three to five times daily for the past two years - Previous suicide attempt three years ago - Engaged in therapy - Strong support system including a roommate and a nearby friend - Brother's wife is a museum/gallery exhibitions officer  Diabetes mellitus - Currently taking Jardiance , metformin , and Ozempic  - Issue with pharmacy regarding Jardiance  is being resolved - Due for diabetic eye exam     PERTINENT   PMH / PSH: hx schizophrenia, CKD, T2DM, severe depression and bipolar disorder  OBJECTIVE:   BP 121/89   Pulse 78   Ht 5' 11 (1.803 m)   Wt 258 lb 8 oz (117.3 kg)   SpO2 100%   BMI 36.05 kg/m    Physical Exam General: NAD, pleasant, able to participate in exam Respiratory: No respiratory distress MSK: Mild TTP right lumbar paraspinal musculature  Skin: warm and dry, no rashes noted Psych: Flat affect and mood     12/11/2023    9:04 AM 12/07/2023    1:58 PM 09/28/2023    2:07 PM  PHQ9 SCORE ONLY  PHQ-9 Total Score 18  15  16       Data saved with a previous flowsheet row definition      Feet: No notable skin breakdown or wounds.  Sensation intact to sharp and dull sensation in multiple locations on the toes, dorsum of foot, sole, and above the ankle.  Proprioception intact.  Able to bear weight.  ASSESSMENT/PLAN:     Assessment & Plan Controlled type 2 diabetes mellitus without complication, without long-term current use of insulin  (HCC) Well controlled A1c as above Continue current medications UACR today Chronic right-sided low back pain without sciatica Chronic muscular low back pain, unresponsive to Tylenol , Flexeril , and lidocaine  patches. No neurological symptoms. - Prescribe Robaxin . - Consider referral to physical therapy. Suicidal ideations Sleep disturbance Stable. Chronic depression with suicidal ideation, managed by mental health professionals. No current self-harm intent, but history of medication overdose and  non-compliance with diabetes medication. Strong support system. - Continue Wellbutrin , Klonopin, fluvoxamine , and paliperidone. - Discuss sleep issues with psychiatrist given he is already on some sedating medications - Encourage use of behavioral health urgent care if suicidal thoughts intensify.   Payton Coward, MD [facility]

## 2024-04-12 NOTE — Assessment & Plan Note (Signed)
 Stable. Chronic depression with suicidal ideation, managed by mental health professionals. No current self-harm intent, but history of medication overdose and non-compliance with diabetes medication. Strong support system. - Continue Wellbutrin , Klonopin, fluvoxamine , and paliperidone. - Discuss sleep issues with psychiatrist given he is already on some sedating medications - Encourage use of behavioral health urgent care if suicidal thoughts intensify.

## 2024-04-13 LAB — MICROALBUMIN / CREATININE URINE RATIO
Creatinine, Urine: 168.8 mg/dL
Microalb/Creat Ratio: 24 mg/g{creat} (ref 0–29)
Microalbumin, Urine: 40.7 ug/mL

## 2024-04-14 ENCOUNTER — Ambulatory Visit: Payer: Self-pay | Admitting: Family Medicine

## 2024-05-09 ENCOUNTER — Other Ambulatory Visit: Payer: Self-pay

## 2024-05-09 DIAGNOSIS — E118 Type 2 diabetes mellitus with unspecified complications: Secondary | ICD-10-CM

## 2024-05-10 MED ORDER — OZEMPIC (2 MG/DOSE) 8 MG/3ML ~~LOC~~ SOPN: 2.0000 mg | PEN_INJECTOR | SUBCUTANEOUS | 3 refills | Status: AC

## 2024-05-26 ENCOUNTER — Ambulatory Visit
Admission: RE | Admit: 2024-05-26 | Discharge: 2024-05-26 | Disposition: A | Discharge status: Home / Self Care | Source: Ambulatory Visit

## 2024-05-26 ENCOUNTER — Ambulatory Visit (INDEPENDENT_AMBULATORY_CARE_PROVIDER_SITE_OTHER): Admitting: Family Medicine

## 2024-05-26 VITALS — BP 113/76 | HR 83 | Ht 71.0 in | Wt 256.4 lb

## 2024-05-26 DIAGNOSIS — M5416 Radiculopathy, lumbar region: Secondary | ICD-10-CM

## 2024-05-26 DIAGNOSIS — M160 Bilateral primary osteoarthritis of hip: Secondary | ICD-10-CM | POA: Diagnosis not present

## 2024-05-26 MED ORDER — GABAPENTIN 100 MG PO CAPS: 100.0000 mg | ORAL_CAPSULE | Freq: Three times a day (TID) | ORAL | 0 refills | Status: AC

## 2024-05-26 NOTE — Patient Instructions (Signed)
 It was wonderful to see you today!  Your back pain is likely caused by something called lumbar radiculopathy, where the nerves that travel through your back get irritated and cause pain.  I have prescribed a medicine called gabapentin for you to take.  Because you take multiple different medications that can interact with.  I would like you to start taking it in the evening, and if you have no adverse side effects such as increased drowsiness or difficulty staying awake, you can take it up to 3 times every day.  Please do not take more than 1 dose every 8 hours.  I have also ordered an x-ray of your lumbar spine to look for any bony problems which could be compressing the nerves.  You will go to the [address]. which is [facility] to complete this imaging study.  I have also placed a referral for physical therapy.  They will call you in the next few weeks to schedule your first appointment.  Please call [phone] with any questions about today's appointment.   If you need any additional refills, please call your pharmacy before calling the office.  Lucie Pinal, DO Family Medicine

## 2024-05-26 NOTE — Progress Notes (Signed)
° ° °  SUBJECTIVE:   CHIEF COMPLAINT / HPI:   Back and Hip pain: - intermittent dull pain since Monday - heat yes, advil /tylenol  no - worst with standing up or going from lying down to sitting - present bilaterally but worse on the L  - radiates into the buttock on the L  PERTINENT  PMH / PSH: Severe recurrent major depression, chronic pain  OBJECTIVE:   BP 113/76   Pulse 83   Ht 5' 11 (1.803 m)   Wt 256 lb 6.4 oz (116.3 kg)   SpO2 96%   BMI 35.76 kg/m   MSK: Hip- no tenderness to palpation over the L or R ROM: AROM limited by pain on the L in flexion and internal rotation, on the right with internal and external rotation Special tests: Positive straight leg raise L side, Positive FADIR bilaterally, FABER positive on the right Strength: 5/5 bilaterally Sensation: Intact bilaterally   ASSESSMENT/PLAN:   Assessment & Plan Lumbar back pain with radiculopathy affecting left lower extremity - most likely cause of chief complaint today -start gabapentin 100 mg TID PRN -advised patient to start with dose at bed time, and use during the day if no excess drowsiness occurs - DG lumbar spine to assess for foraminal stenosis or other bony process - follow up in 1-2 weeks to discuss  -amb referral to PT Primary osteoarthritis of both hips - likely, due to positive FADIR/FABER on exam today - will addend PT order to include treatment focused on hips as well as back.  - given this appears more incidental compared to radiculopathy, reasonable to monitor for now and consider dedicated imaging of the hips in the future.    Lucie Pinal, DO [facility]

## 2024-06-01 ENCOUNTER — Encounter: Payer: Self-pay | Admitting: Family Medicine

## 2024-06-01 ENCOUNTER — Ambulatory Visit: Admitting: Family Medicine

## 2024-06-01 VITALS — BP 119/81 | HR 86 | Ht 71.0 in | Wt 256.4 lb

## 2024-06-01 DIAGNOSIS — M5416 Radiculopathy, lumbar region: Secondary | ICD-10-CM | POA: Insufficient documentation

## 2024-06-01 NOTE — Progress Notes (Signed)
° ° °  SUBJECTIVE:   CHIEF COMPLAINT / HPI:   Follow up LBP - improved with gabapentin  use - PT reached out to him, will start next week - DG Lumbar without concern for stenosis or disc involvement - denies significant side effects  PERTINENT  PMH / PSH: LBP  OBJECTIVE:   BP 119/81   Pulse 86   Ht 5' 11 (1.803 m)   Wt 256 lb 6.4 oz (116.3 kg)   SpO2 98%   BMI 35.76 kg/m   General: Well appearing, no distress Mood: More interactive and appropriate than prior Respiratory: Even, unlabored breathing.   ASSESSMENT/PLAN:   Assessment & Plan Lumbar back pain with radiculopathy affecting left lower extremity - continue PRN gabapentin  - follow up with PT as scheduled - follow up in office as needed.    Marc Pinal, DO [facility]

## 2024-06-01 NOTE — Patient Instructions (Signed)
 It was wonderful to see you today!  I am glad to hear that your pain is better controlled. You may continue using the gabapentin  as needed for flairs. Keep up with your physical therapy, and if you need any other help please don't hesitate to reach out.   Please call [phone] with any questions about today's appointment.   If you need any additional refills, please call your pharmacy before calling the office.  Lucie Pinal, DO Family Medicine

## 2024-06-01 NOTE — Assessment & Plan Note (Signed)
-   continue PRN gabapentin  - follow up with PT as scheduled - follow up in office as needed.

## 2024-06-14 NOTE — Progress Notes (Signed)
 " OUTPATIENT PHYSICAL THERAPY THORACOLUMBAR EVALUATION   Patient Name: Marc Long MRN: [id number] DOB:1976-04-25, 48 y.o., male Today's Date: 06/15/2024  END OF SESSION:  PT End of Session - 06/15/24 1506     Visit Number 1    Number of Visits --   1-2/per week   Date for Recertification  08/17/24    Authorization Type [employer] DUAL COMPLETE    Authorization - Visit Number 1    Authorization - Number of Visits 27    PT Start Time 1502    PT Stop Time 1545    PT Time Calculation (min) 43 min    Activity Tolerance Patient tolerated treatment well    Behavior During Therapy WFL for tasks assessed/performed          Past Medical History:  Diagnosis Date   Depression    Diabetes mellitus due to underlying condition with unspecified complications (HCC) 11/19/2018   Diabetes mellitus without complication (HCC)    DM (diabetes mellitus), type 2, uncontrolled 12/12/2016   Gallstones    H/O: HTN (hypertension) 07/18/2021   Hyperglycemia due to type 2 diabetes mellitus (HCC) 12/12/2016   Hypertension    Obesity, Class III, BMI 40-49.9 (morbid obesity) (HCC)    History reviewed. No pertinent surgical history. Patient Active Problem List   Diagnosis Date Noted   Lumbar back pain with radiculopathy affecting left lower extremity 06/01/2024   Mass of right side of neck 07/16/2023   Abdominal pain 01/08/2023   Nausea 11/03/2022   Knee pain, left 11/03/2022   Left lower quadrant pain 10/21/2022   Flank pain 08/25/2022   Diverticulitis of colon 08/01/2022   Erectile dysfunction 07/24/2022   Hypotension 07/24/2022   Right forearm pain 05/14/2022   Constipation 05/06/2022   Chronic pain of both shoulders 11/27/2021   Stage 3b chronic kidney disease (HCC) 02/21/2021   Dizziness 02/21/2021   Schizophrenia (HCC) 04/06/2018   Severe recurrent major depression without psychotic features (HCC) 12/16/2017   Severe bipolar I disorder, most recent episode depressed (HCC)     Hypertriglyceridemia 02/05/2017   Suicidal ideations 12/12/2016   Obesity, Class III, BMI 40-49.9 (morbid obesity) (HCC)    Type 2 diabetes mellitus with complication, with long-term current use of insulin  (HCC) 12/10/2016   Moderate single current episode of major depressive disorder (HCC) 04/15/2016   Shoulder pain 04/14/2016   Diverticulosis of large intestine without hemorrhage 11/07/2015   Calculus of gallbladder without cholecystitis 11/07/2015    PCP: Elicia Hamlet, MD   REFERRING PROVIDER: Orie Milda CROME, MD   REFERRING DIAG: M54.16 (ICD-10-CM) - Lumbar back pain with radiculopathy affecting left lower extremity   Rationale for Evaluation and Treatment: Rehabilitation  THERAPY DIAG:  Other low back pain  Sciatica, left side  Muscle spasm of back  ONSET DATE: Couple of years  SUBJECTIVE:  SUBJECTIVE STATEMENT: Pt reports his L low back and L LE pain are periodic and usually lasts about a day. The last episode lasted 5 days and that's when he sought medical care. These episodes are significant in severity and limit his QOL and ability to function. Pt states l LE pain is not always associated with the L low back pain. Pt notes he works out at [employer] approx 3 days a week  PERTINENT HISTORY:  DM, High BMI  PAIN:  Are you having pain? Yes: NPRS scale: current 0/10.  About 3 weeks ago: 5-7/10 Pain location: L low back and LE pain Pain description: dull ache, but sharp with movement Aggravating factors: Standing too long Relieving factors: gabapentin   PRECAUTIONS: None  RED FLAGS: None   WEIGHT BEARING RESTRICTIONS: No  FALLS:  Has patient fallen in last 6 months? 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  DIAGNOSTIC FINDINGS:  05/26/24 IMPRESSION: 1. No acute findings. 2. Scoliosis concave to the right at the upper lumbar spine.  PATIENT SURVEYS:  Modified Oswestry: 22%  Score Category Description  0-20% Minimal Disability The patient can cope with most living activities. Usually no treatment is indicated apart from advice on lifting, sitting and exercise  21-40% Moderate Disability The patient experiences more pain and difficulty with sitting, lifting and standing. Travel and social life are more difficult and they may be disabled from work. Personal care, sexual activity and sleeping are not grossly affected, and the patient can usually be managed by conservative means  41-60% Severe Disability Pain remains the main problem in this group, but activities of daily living are affected. These patients require a detailed investigation  61-80% Crippled Back pain impinges on all aspects of the patients life. Positive intervention is required  81-100% Bed-bound These patients are either bed-bound or exaggerating their symptoms  Bluford FORBES Zoe DELENA Karon DELENA, et al. Surgery versus conservative management of stable thoracolumbar fracture: the PRESTO feasibility RCT. [place] [employer]; 2021 Nov. [facility] Technology Assessment, No. 25.62.) Appendix 3, Oswestry Disability Index category descriptors. Available from: Findjewelers.cz  Minimally Clinically Important Difference (MCID) = 12.8%  COGNITION: Overall cognitive status: Within functional limits for tasks assessed     SENSATION: WFL  MUSCLE LENGTH: Hamstrings: Right 45d deg; Left 45d deg  POSTURE: rounded shoulders  PALPATION: TTP of the L QL and  paraspinals with increase muscle tension  LUMBAR ROM:   AROM eval  Flexion Full; Increase in L low back pain  Extension Full; no pain increase  Right lateral flexion Full; increase in L low back pain  Left lateral flexion Full; no pain increase  Right rotation Full; no pain increase  Left rotation Full; no pain increase   (Blank rows = not tested)  LOWER EXTREMITY ROM:    WFLs Active  Right eval Left eval  Hip flexion    Hip extension    Hip abduction    Hip adduction    Hip internal rotation    Hip external rotation    Knee flexion    Knee extension    Ankle dorsiflexion    Ankle plantarflexion    Ankle inversion    Ankle eversion     (Blank  rows = not tested)  LOWER EXTREMITY MMT:   Myotome screen negative. LE strength good MMT Right eval Left eval  Hip flexion    Hip extension    Hip abduction    Hip adduction    Hip internal rotation    Hip external rotation    Knee flexion    Knee extension    Ankle dorsiflexion    Ankle plantarflexion    Ankle inversion    Ankle eversion     (Blank rows = not tested)  LUMBAR SPECIAL TESTS:  Straight leg raise test: Negative, Slump test: Negative, SI Compression/distraction test: Negative, and FABER test: Negative  FUNCTIONAL TESTS:  NT  GAIT: Distance walked: 200' Assistive device utilized: None Level of assistance: Complete Independence Comments: WNLs  TREATMENT DATE:  [facility] Adult PT Treatment:                                                DATE: 06/15/24 Therapeutic Exercise: Developed, instructed in, and pt completed therex as noted in HEP  Self Care: Eval findings and purpose of PT                                                                                                                                PATIENT EDUCATION:  Education details: Eval findings, POC, HEP, self care  Person educated: Patient Education method: Explanation, Demonstration, Tactile cues, Verbal cues, and Handouts Education  comprehension: verbalized understanding, returned demonstration, verbal cues required, and tactile cues required  HOME EXERCISE PROGRAM: Access Code: [email or web address] URL: [email or web address] Date: 06/15/2024 Prepared by: Dasie Daft  Exercises - Hooklying Single Knee to Chest  - 1 x daily - 7 x weekly - 1 sets - 3 reps - 20 hold - Supine Piriformis Stretch with Foot on Ground  - 1 x daily - 7 x weekly - 1 sets - 3 reps - 20 hold - Supine Lower Trunk Rotation  - 1 x daily - 7 x weekly - 1 sets - 10 reps - 5 hold - Seated Flexion Stretch with Swiss Ball  - 1 x daily - 7 x weekly - 1 sets - 5-10 reps - 5-20 hold  ASSESSMENT:  CLINICAL IMPRESSION: Patient is a 48 y.o. male who was seen today for physical therapy evaluation and treatment for M54.16 (ICD-10-CM) - Lumbar back pain with radiculopathy affecting left lower extremity. Pt presents with intermittent L low back and LE pain, scoliosis concave to the right at the upper lumbar spine, increased tenderness and muscle tension of the L QL and lumbar paraspinals, back weakness, tight hamstrings, and L low back pain which was aggravated by trunk forward bending and R SB. Pt's Mod Oswestry indicates a min/mod level perceived functional disability. A HEP to address lumbopelvic flexibility was started. Pt will benefit from skilled  PT to address impairments to optimize function with less pain.   OBJECTIVE IMPAIRMENTS: decreased activity tolerance, decreased strength, postural dysfunction, pain, and high BMI.   ACTIVITY LIMITATIONS: lifting, bending, standing, and locomotion level  PARTICIPATION LIMITATIONS: meal prep, cleaning, and laundry  PERSONAL FACTORS: Fitness, Time since onset of injury/illness/exacerbation, and 1-2 comorbidities: high BMI, DM2 are also affecting patient's functional outcome.   REHAB POTENTIAL: Good  CLINICAL DECISION MAKING: Evolving/moderate complexity  EVALUATION COMPLEXITY: Moderate   GOALS:  SHORT TERM  GOALS: Target date: 07/09/23  Pt will be Ind in an initial HEP  Baseline:started Goal status: INITIAL  2.  Pt will report a 25% or greater decrease in pain for improved function and QOL Baseline:  Goal status: INITIAL  LONG TERM GOALS: Target date: 08/18/23  Pt will be Ind in a final HEP to maintain achieved LOF  Baseline: started Goal status: INITIAL  2.  Pt will report a 75% or greater decrease in pain for improved function and QOL Baseline:  Goal status: INITIAL  3.  Increase hamstring flexibility to 60d or greater to minimize low back strain  Baseline: 45d Goal status: INITIAL  4.  Pt will demonstrate proper body mechanics for for lifting and household activities to minimize low back strain  Baseline:  Goal status: INITIAL  5.  Pt's Mod Oswestry score will improve to 10% or better as indication of improved function  Baseline: 22% Goal status: INITIAL  PLAN:  PT FREQUENCY: 1-2x/week  PT DURATION: 8 weeks  PLANNED INTERVENTIONS: 97164- PT Re-evaluation, 97110-Therapeutic exercises, 97530- Therapeutic activity, 97112- Neuromuscular re-education, 97535- Self Care, [id number]- Manual therapy, [id number]- Aquatic Therapy, [id number]- Electrical stimulation (unattended), 20560 (1-2 muscles), 20561 (3+ muscles)- Dry Needling, Patient/Family education, Taping, Cryotherapy, and Moist heat.  PLAN FOR NEXT SESSION: Assess response to HEP; progress therex as indicated; use of modalities, manual therapy; and TPDN as indicated.    Aeriana Speece MS, PT 06/15/2024 5:49 PM  "

## 2024-06-15 ENCOUNTER — Ambulatory Visit

## 2024-06-15 ENCOUNTER — Other Ambulatory Visit: Payer: Self-pay

## 2024-06-15 DIAGNOSIS — M5459 Other low back pain: Secondary | ICD-10-CM | POA: Diagnosis present

## 2024-06-15 DIAGNOSIS — M62830 Muscle spasm of back: Secondary | ICD-10-CM | POA: Insufficient documentation

## 2024-06-15 DIAGNOSIS — M5432 Sciatica, left side: Secondary | ICD-10-CM | POA: Insufficient documentation

## 2024-06-15 DIAGNOSIS — M5416 Radiculopathy, lumbar region: Secondary | ICD-10-CM | POA: Insufficient documentation

## 2024-06-20 ENCOUNTER — Other Ambulatory Visit: Payer: Self-pay | Admitting: Family Medicine

## 2024-06-20 DIAGNOSIS — Z794 Long term (current) use of insulin: Secondary | ICD-10-CM

## 2024-06-21 ENCOUNTER — Ambulatory Visit

## 2024-06-21 DIAGNOSIS — M62830 Muscle spasm of back: Secondary | ICD-10-CM | POA: Insufficient documentation

## 2024-06-21 DIAGNOSIS — M5432 Sciatica, left side: Secondary | ICD-10-CM | POA: Diagnosis present

## 2024-06-21 DIAGNOSIS — M5459 Other low back pain: Secondary | ICD-10-CM | POA: Insufficient documentation

## 2024-06-21 NOTE — Therapy (Signed)
 " OUTPATIENT PHYSICAL THERAPY THORACOLUMBAR TREATMENT   Patient Name: Marc Long MRN: [id number] DOB:Sep 27, 1975, 49 y.o., male Today's Date: 06/15/2024  END OF SESSION:  PT End of Session - 06/21/24 1555     Visit Number 2    Number of Visits --   1-2x/week   Date for Recertification  08/17/24    Authorization Type [employer] DUAL COMPLETE    Authorization - Visit Number 2    Authorization - Number of Visits 27    PT Start Time 1545    PT Stop Time 1630    PT Time Calculation (min) 45 min    Activity Tolerance Patient tolerated treatment well    Behavior During Therapy WFL for tasks assessed/performed           Past Medical History:  Diagnosis Date   Depression    Diabetes mellitus due to underlying condition with unspecified complications (HCC) 11/19/2018   Diabetes mellitus without complication (HCC)    DM (diabetes mellitus), type 2, uncontrolled 12/12/2016   Gallstones    H/O: HTN (hypertension) 07/18/2021   Hyperglycemia due to type 2 diabetes mellitus (HCC) 12/12/2016   Hypertension    Obesity, Class III, BMI 40-49.9 (morbid obesity) (HCC)    History reviewed. No pertinent surgical history. Patient Active Problem List   Diagnosis Date Noted   Lumbar back pain with radiculopathy affecting left lower extremity 06/01/2024   Mass of right side of neck 07/16/2023   Abdominal pain 01/08/2023   Nausea 11/03/2022   Knee pain, left 11/03/2022   Left lower quadrant pain 10/21/2022   Flank pain 08/25/2022   Diverticulitis of colon 08/01/2022   Erectile dysfunction 07/24/2022   Hypotension 07/24/2022   Right forearm pain 05/14/2022   Constipation 05/06/2022   Chronic pain of both shoulders 11/27/2021   Stage 3b chronic kidney disease (HCC) 02/21/2021   Dizziness 02/21/2021   Schizophrenia (HCC) 04/06/2018   Severe recurrent major depression without psychotic features (HCC) 12/16/2017   Severe bipolar I disorder, most recent episode depressed (HCC)     Hypertriglyceridemia 02/05/2017   Suicidal ideations 12/12/2016   Obesity, Class III, BMI 40-49.9 (morbid obesity) (HCC)    Type 2 diabetes mellitus with complication, with long-term current use of insulin  (HCC) 12/10/2016   Moderate single current episode of major depressive disorder (HCC) 04/15/2016   Shoulder pain 04/14/2016   Diverticulosis of large intestine without hemorrhage 11/07/2015   Calculus of gallbladder without cholecystitis 11/07/2015    PCP: Elicia Hamlet, MD   REFERRING PROVIDER: Orie Milda CROME, MD   REFERRING DIAG: M54.16 (ICD-10-CM) - Lumbar back pain with radiculopathy affecting left lower extremity   Rationale for Evaluation and Treatment: Rehabilitation  THERAPY DIAG:  Other low back pain  Sciatica, left side  Muscle spasm of back  ONSET DATE: Couple of years  SUBJECTIVE:  SUBJECTIVE STATEMENT: Low back has been doing well. Some twinges of pain today, but they resolved. Went to gym yesterday for the first time in a month and tolerated it well, but realized he had gotten a little weaker.  EVAL: Pt reports his L low back and L LE pain are periodic and usually lasts about a day. The last episode lasted 5 days and that's when he sought medical care. These episodes are significant in severity and limit his QOL and ability to function. Pt states l LE pain is not always associated with the L low back pain. Pt notes he works out at [employer] approx 3 days a week  PERTINENT HISTORY:  DM, High BMI  PAIN:  Are you having pain? Yes: NPRS scale: current 0/10.  About 3 weeks ago: 5-7/10 Pain location: L low back and LE pain Pain description: dull ache, but sharp with movement Aggravating factors: Standing too long Relieving factors: gabapentin   PRECAUTIONS: None  RED  FLAGS: None   WEIGHT BEARING RESTRICTIONS: No  FALLS:  Has patient fallen in last 6 months? 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  DIAGNOSTIC FINDINGS:  05/26/24 IMPRESSION: 1. No acute findings. 2. Scoliosis concave to the right at the upper lumbar spine.  PATIENT SURVEYS:  Modified Oswestry: 22%  Score Category Description  0-20% Minimal Disability The patient can cope with most living activities. Usually no treatment is indicated apart from advice on lifting, sitting and exercise  21-40% Moderate Disability The patient experiences more pain and difficulty with sitting, lifting and standing. Travel and social life are more difficult and they may be disabled from work. Personal care, sexual activity and sleeping are not grossly affected, and the patient can usually be managed by conservative means  41-60% Severe Disability Pain remains the main problem in this group, but activities of daily living are affected. These patients require a detailed investigation  61-80% Crippled Back pain impinges on all aspects of the patients life. Positive intervention is required  81-100% Bed-bound These patients are either bed-bound or exaggerating their symptoms  Bluford FORBES Zoe DELENA Karon DELENA, et al. Surgery versus conservative management of stable thoracolumbar fracture: the PRESTO feasibility RCT. [place] ([place]): [employer]; 2021 Nov. [facility] Technology Assessment, No. 25.62.) Appendix 3, Oswestry Disability Index category descriptors. Available from: Findjewelers.cz  Minimally Clinically Important Difference (MCID) = 12.8%  COGNITION: Overall  cognitive status: Within functional limits for tasks assessed     SENSATION: WFL  MUSCLE LENGTH: Hamstrings: Right 45d deg; Left 45d deg  POSTURE: rounded shoulders  PALPATION: TTP of the L QL and paraspinals with increase muscle tension  LUMBAR ROM:   AROM eval  Flexion Full; Increase in L low back pain  Extension Full; no pain increase  Right lateral flexion Full; increase in L low back pain  Left lateral flexion Full; no pain increase  Right rotation Full; no pain increase  Left rotation Full; no pain increase   (Blank rows = not tested)  LOWER EXTREMITY ROM:    WFLs Active  Right eval Left eval  Hip flexion    Hip extension    Hip abduction    Hip adduction    Hip internal rotation  Hip external rotation    Knee flexion    Knee extension    Ankle dorsiflexion    Ankle plantarflexion    Ankle inversion    Ankle eversion     (Blank rows = not tested)  LOWER EXTREMITY MMT:   Myotome screen negative. LE strength good MMT Right eval Left eval  Hip flexion    Hip extension    Hip abduction    Hip adduction    Hip internal rotation    Hip external rotation    Knee flexion    Knee extension    Ankle dorsiflexion    Ankle plantarflexion    Ankle inversion    Ankle eversion     (Blank rows = not tested)  LUMBAR SPECIAL TESTS:  Straight leg raise test: Negative, Slump test: Negative, SI Compression/distraction test: Negative, and FABER test: Negative  FUNCTIONAL TESTS:  NT  GAIT: Distance walked: 200' Assistive device utilized: None Level of assistance: Complete Independence Comments: WNLs  TREATMENT DATE:  [facility] Adult PT Treatment:                                                DATE: 06/21/24 Therapeutic Exercise: Nustep L5 Hooklying Single Knee to Chest  x2 20 Supine Piriformis Stretch x2 20 Supine Lower Trunk Rotation  x5 10 Seated Flexion Stretch with Swiss Ball x5 20 PPT x10 3 AB bracing 90/90 x5 10 H/L clams x15  BluTB Bridging c PPT x10 3 Paloff press x12 BluTB  [facility] Adult PT Treatment:                                                DATE: 06/15/24 Therapeutic Exercise: Developed, instructed in, and pt completed therex as noted in HEP  Self Care: Eval findings and purpose of PT                                                                                                                                PATIENT EDUCATION:  Education details: Eval findings, POC, HEP, self care  Person educated: Patient Education method: Explanation, Demonstration, Tactile cues, Verbal cues, and Handouts Education comprehension: verbalized understanding, returned demonstration, verbal cues required, and tactile cues required  HOME EXERCISE PROGRAM: Access Code: [email or web address] URL: [email or web address] Date: 06/21/2024 Prepared by: Dasie Daft  Exercises - Hooklying Single Knee to Chest  - 1 x daily - 7 x weekly - 1 sets - 3 reps - 20 hold - Supine Piriformis Stretch with Foot on Ground  - 1 x daily - 7 x weekly - 1 sets - 3 reps - 20 hold - Supine Lower Trunk Rotation  - 1 x daily -  7 x weekly - 1 sets - 10 reps - 5 hold - Seated Flexion Stretch with Swiss Ball  - 1 x daily - 7 x weekly - 1 sets - 5-10 reps - 5-20 hold - Supine Posterior Pelvic Tilt  - 1 x daily - 7 x weekly - 1 sets - 10 reps - 3 hold - Supine 90/90 Abdominal Bracing  - 1 x daily - 7 x weekly - 2 sets - 5 reps - 10 hold - Hooklying Clamshell with Resistance  - 1 x daily - 7 x weekly - 2-3 sets - 10 reps - 3 hold - Supine Bridge  - 1 x daily - 7 x weekly - 2 sets - 10 reps - 3 hold - Anti-Rotation Sidestepping with Resistance  - 1 x daily - 7 x weekly - 2 sets - 10 reps - 3 hold  ASSESSMENT:  CLINICAL IMPRESSION: PT was completed for lumbopelvic flexibility and strengthening with review of HEP and the initiation of core strengthening. Pt's HEP was updated. Pt tolerated the prescribed exs without adverse effects. Pt indicates  consistent completion of his HEP. Pt will continue to benefit from skilled PT to address impairments for improved back function c minimized pain.   EVAL: Patient is a 49 y.o. male who was seen today for physical therapy evaluation and treatment for M54.16 (ICD-10-CM) - Lumbar back pain with radiculopathy affecting left lower extremity. Pt presents with intermittent L low back and LE pain, scoliosis concave to the right at the upper lumbar spine, increased tenderness and muscle tension of the L QL and lumbar paraspinals, back weakness, tight hamstrings, and L low back pain which was aggravated by trunk forward bending and R SB. Pt's Mod Oswestry indicates a min/mod level perceived functional disability. A HEP to address lumbopelvic flexibility was started. Pt will benefit from skilled PT to address impairments to optimize function with less pain.   OBJECTIVE IMPAIRMENTS: decreased activity tolerance, decreased strength, postural dysfunction, pain, and high BMI.   ACTIVITY LIMITATIONS: lifting, bending, standing, and locomotion level  PARTICIPATION LIMITATIONS: meal prep, cleaning, and laundry  PERSONAL FACTORS: Fitness, Time since onset of injury/illness/exacerbation, and 1-2 comorbidities: high BMI, DM2 are also affecting patient's functional outcome.   REHAB POTENTIAL: Good  CLINICAL DECISION MAKING: Evolving/moderate complexity  EVALUATION COMPLEXITY: Moderate   GOALS:  SHORT TERM GOALS: Target date: 07/09/23  Pt will be Ind in an initial HEP  Baseline:started Goal status: INITIAL  2.  Pt will report a 25% or greater decrease in pain for improved function and QOL Baseline:  Goal status: INITIAL  LONG TERM GOALS: Target date: 08/18/23  Pt will be Ind in a final HEP to maintain achieved LOF  Baseline: started Goal status: INITIAL  2.  Pt will report a 75% or greater decrease in pain for improved function and QOL Baseline:  Goal status: INITIAL  3.  Increase hamstring flexibility  to 60d or greater to minimize low back strain  Baseline: 45d Goal status: INITIAL  4.  Pt will demonstrate proper body mechanics for for lifting and household activities to minimize low back strain  Baseline:  Goal status: INITIAL  5.  Pt's Mod Oswestry score will improve to 10% or better as indication of improved function  Baseline: 22% Goal status: INITIAL  PLAN:  PT FREQUENCY: 1-2x/week  PT DURATION: 8 weeks  PLANNED INTERVENTIONS: 97164- PT Re-evaluation, 97110-Therapeutic exercises, 97530- Therapeutic activity, [id number]- Neuromuscular re-education, 97535- Self Care, 02859- Manual therapy, [id number]- Aquatic Therapy, H9716- Electrical  stimulation (unattended), 20560 (1-2 muscles), 20561 (3+ muscles)- Dry Needling, Patient/Family education, Taping, Cryotherapy, and Moist heat.  PLAN FOR NEXT SESSION: Assess response to HEP; progress therex as indicated; use of modalities, manual therapy; and TPDN as indicated.    Prentice Sackrider MS, PT 06/15/2024 5:49 PM "

## 2024-06-22 MED ORDER — METFORMIN HCL 1000 MG PO TABS: 1000.0000 mg | ORAL_TABLET | Freq: Two times a day (BID) | ORAL | 3 refills | Status: AC

## 2024-07-04 ENCOUNTER — Ambulatory Visit: Admitting: Physical Therapy

## 2024-07-07 ENCOUNTER — Encounter: Payer: Self-pay | Admitting: Family Medicine

## 2024-07-07 ENCOUNTER — Ambulatory Visit: Admitting: Family Medicine

## 2024-07-07 VITALS — BP 137/87 | HR 87 | Temp 98.5°F | Ht 72.0 in | Wt 260.2 lb

## 2024-07-07 DIAGNOSIS — K5792 Diverticulitis of intestine, part unspecified, without perforation or abscess without bleeding: Secondary | ICD-10-CM

## 2024-07-07 DIAGNOSIS — R45851 Suicidal ideations: Secondary | ICD-10-CM

## 2024-07-07 MED ORDER — AMOXICILLIN-POT CLAVULANATE ER 1000-62.5 MG PO TB12: 2.0000 | ORAL_TABLET | Freq: Two times a day (BID) | ORAL | 0 refills | Status: AC

## 2024-07-07 NOTE — Patient Instructions (Addendum)
 It was wonderful to see you today.  Please bring ALL of your medications with you to every visit.    VISIT SUMMARY: Marc Long, a 49 year old male, visited today due to abdominal pain related to his history of diverticulitis. He also discussed his ongoing mental health concerns, including passive suicidal ideation and depression. His chronic kidney disease was also reviewed.  YOUR PLAN: -DIVERTICULITIS: Diverticulitis is a condition where small pouches in the digestive tract become inflamed or infected. You have been prescribed Augmentin  for 7 days to treat the current episode. For pain management, you can take Tylenol  up to 4000 mg a day, but avoid ibuprofen  due to your chronic kidney disease.   -SUICIDAL IDEATION AND HISTORY OF SUICIDE ATTEMPT: You have longstanding passive suicidal thoughts with a plan to overdose on metformin , but no active intent. You have significant support from the ACT team and your therapist. Continue to utilize these resources and ensure you have access to the 24-hour crisis line.  INSTRUCTIONS: Please follow up with your therapist and the ACT team as scheduled. Complete the 7-day course of Augmentin  as prescribed. Use Tylenol  for pain management, but avoid ibuprofen  and tramadol . Ensure you have access to the 24-hour crisis line for any urgent mental health needs.  Contains text generated by Abridge.   Thank you for choosing [facility].   Please call [phone] with any questions about today's appointment.  Please be sure to schedule follow up at the front desk before you leave today.   Areta Saliva, MD  Family Medicine

## 2024-07-07 NOTE — Progress Notes (Signed)
 "  SUBJECTIVE:   CHIEF COMPLAINT / HPI:  Discussed the use of AI scribe software for clinical note transcription with the patient, who gave verbal consent to proceed.  History of Present Illness Marc Long is a 49 year old male with history of diverticulitis who presents with abdominal pain.  Abdominal pain  - Intermittent left lower quadrant pain rated 6/10, occasionally 7/10 the last few days - Pain worsened by deep breathing, coughing, yawning, and movement - Eating and drinking do not affect pain - No associated fever or vomiting - Bowel movements are regular though not daily, with last bowel movement this morning -Has used 1500 mg of Tylenol  today does not use NSAIDs due to his CKD. - Diverticulitis episodes occur three to four times per year - Treated with antibiotics about a year ago with good response - No evaluation by general surgery - Colonoscopy performed last year with recommended follow-up in 10 years. -For his last diverticulitis episode he had side effects with Cipro  and Flagyl  thus was treated with Augmentin  instead.  Suicidal ideation and psychiatric history - Passive suicidal thoughts nearly every day without active intent - Has thoughts to overdose on metformin ; roommate holds medication when thoughts intensify - Three prior suicide attempts, most recently in 2017 - Followed by ACT team - Has a therapist whom he saw just before this visit - Access to a 24-hour crisis line with his ACT team and has good follow-up with them. - Has support from his mother who is also with him at this appointment. - Follows with psychiatrist and is on multiple psychotropic medications. - Psychiatrist recently changed medication from Rexalti to Capylta due to sleep problems    PERTINENT  PMH / PSH: Type 2 diabetes, CKD, MDD, history of diverticulitis, bipolar 1,  OBJECTIVE:  BP 137/87   Pulse 87   Temp 98.5 F (36.9 C) (Oral)   Ht 6' (1.829 m)   Wt 260 lb 3.2 oz (118  kg)   SpO2 100%   BMI 35.29 kg/m   General: well appearing, in no acute distress CV: RRR, radial pulses equal and palpable, no BLE edema  Resp: Normal work of breathing on room air, CTAB Abd: Soft, tender in left lower quadrant to palpation, no guarding, non distended  Neuro: Alert & Oriented  Psych: Seems mildly cognitively delayed, good insight, very matter-of-fact responses, good judgment, passive suicidal ideation, no active intent.    ASSESSMENT/PLAN:   Assessment & Plan Diverticulitis Recurrent diverticulitis with intermittent lower left abdominal pain.  He is hemodynamically stable without systemic symptoms.  Previous episodes responded to antibiotics. No recent surgical consultation. Last colonoscopy was last year. - Prescribed Augmentin  for 7 days given allergy to Cipro  and Flagyl  - Advised Tylenol  for pain management, up to 4000 mg in 24 hours. - Advised to avoid ibuprofen  due to CKD. - Avoiding opioids for pain management given would worsen abdominal pain likely and avoiding tramadol  given risk of serotonin syndrome with patient's other medications. Passive suicidal ideations Suicidal ideation and history of suicide attempt Longstanding passive suicidal ideation with a plan to overdose on metformin . No active intent. History of three suicide attempts, last in 2017. Significant support from ACT team and therapist. - Continue support from ACT team and therapist. - Ensure access to 24-hour crisis line. -Also has support from his roommate and his mother who is in the room currently. - Also close has follow-up with his psychiatrist. - Gave patient return precautions and precautions to call his  ACT team or [phone].     Areta Saliva, MD [facility] "

## 2024-07-12 ENCOUNTER — Ambulatory Visit: Admitting: Physical Therapy

## 2024-07-14 ENCOUNTER — Encounter: Payer: Self-pay | Admitting: Family Medicine

## 2024-07-14 ENCOUNTER — Ambulatory Visit: Payer: Self-pay | Admitting: Family Medicine

## 2024-07-14 VITALS — BP 118/84 | HR 76 | Ht 72.0 in | Wt 261.8 lb

## 2024-07-14 DIAGNOSIS — K5792 Diverticulitis of intestine, part unspecified, without perforation or abscess without bleeding: Secondary | ICD-10-CM

## 2024-07-14 DIAGNOSIS — R45851 Suicidal ideations: Secondary | ICD-10-CM

## 2024-07-14 NOTE — Assessment & Plan Note (Signed)
 Extensively documented continual suicidal ideation at every visit.  Persistently marks PHQ-9 question 9 that is 3.  He continues to have the same plan as documented previously which would be overdosing on metformin .  He is adamant that he has NO active intent to commit suicide.  Furthermore, he notes he has a good support system at home, has good psychiatry team, and has multiple things he is looking forward to in his life including his new job.  No active safety concern today.  Directed to continue his current medication regimen and follow-up with psychiatry as directed.

## 2024-07-14 NOTE — Patient Instructions (Addendum)
 It was great to see you! Thank you for allowing me to participate in your care!  Our plans for today:    GI phone number - [phone]  VISIT SUMMARY: Today, we discussed your recent diverticulitis treatment, your current mental health status, and your psychiatric medication management.  YOUR PLAN: DIVERTICULITIS: Your recent episode of diverticulitis has resolved with antibiotics. -Follow up with a gastroenterologist. Contact information has been provided.   Please arrive 15 minutes PRIOR to your next scheduled appointment time! If you do not, this affects OTHER patients' care.  Take care and seek immediate care sooner if you develop any concerns.   Ozell Provencal, MD, PGY-3 [facility] 3:57 PM 07/14/2024  [facility]

## 2024-07-14 NOTE — Progress Notes (Signed)
" ° ° °  SUBJECTIVE:   CHIEF COMPLAINT / HPI: diverticulitis f/u  Discussed the use of AI scribe software for clinical note transcription with the patient, who gave verbal consent to proceed.  History of Present Illness Marc Long is a 49 year old male who presents for a follow-up visit.  Abdominal symptoms - Diverticulitis previously treated with antibiotics - No abdominal pain, nausea, vomiting, diarrhea, or bloating - Colonoscopy performed in February 2025; unclear on follow-up recommendations  Suicidal ideation - Suicidal thoughts present with a plan to overdose on metformin  - No intent to act on suicidal thoughts - Cites new employment at the [employer] and strong support system as protective factors  Psychiatric medication management - Currently taking Invega (paliperidone) - Recent switch from Rexulti to Caplyta - Also taking a medication described as a hybrid, possibly including trazodone  - Psychiatry provider visit one week ago with no medication changes    PERTINENT  PMH / PSH: Recurrent diverticulitis  OBJECTIVE:   BP 118/84   Pulse 76   Ht 6' (1.829 m)   Wt 261 lb 12.8 oz (118.8 kg)   SpO2 100%   BMI 35.51 kg/m   Physical Exam General: NAD, well appearing Neuro: A&O Respiratory: normal WOB on RA Extremities: Moving all 4 extremities equally Abdomen: soft, nontender, no rebound or guarding   ASSESSMENT/PLAN:   Assessment & Plan Diverticulitis Benign abdominal exam today.  Directed patient to finish his antibiotic course.  Current episode has resolved with treatment.  Given multiple bouts of diverticulitis in the past year, provided with phone number to follow-up further with [facility] who he saw in February of last year. Suicidal ideations Extensively documented continual suicidal ideation at every visit.  Persistently marks PHQ-9 question 9 that is 3.  He continues to have the same plan as documented previously which would be overdosing  on metformin .  He is adamant that he has NO active intent to commit suicide.  Furthermore, he notes he has a good support system at home, has good psychiatry team, and has multiple things he is looking forward to in his life including his new job.  No active safety concern today.  Directed to continue his current medication regimen and follow-up with psychiatry as directed.   Return in about 3 months (around 10/12/2024) for PCP f/u.  Ozell Provencal, MD, PGY-3 [facility] 5:27 PM 07/14/2024  [facility]   "

## 2024-07-20 NOTE — Therapy (Signed)
 " OUTPATIENT PHYSICAL THERAPY THORACOLUMBAR TREATMENT   Patient Name: Marc Long MRN: [id number] DOB:11/10/1975, 49 y.o., male Today's Date: 06/15/2024  END OF SESSION:     Past Medical History:  Diagnosis Date   Depression    Diabetes mellitus due to underlying condition with unspecified complications (HCC) 11/19/2018   Diabetes mellitus without complication (HCC)    DM (diabetes mellitus), type 2, uncontrolled 12/12/2016   Gallstones    H/O: HTN (hypertension) 07/18/2021   Hyperglycemia due to type 2 diabetes mellitus (HCC) 12/12/2016   Hypertension    Obesity, Class III, BMI 40-49.9 (morbid obesity) (HCC)    History reviewed. No pertinent surgical history. Patient Active Problem List   Diagnosis Date Noted   Lumbar back pain with radiculopathy affecting left lower extremity 06/01/2024   Mass of right side of neck 07/16/2023   Abdominal pain 01/08/2023   Nausea 11/03/2022   Knee pain, left 11/03/2022   Left lower quadrant pain 10/21/2022   Flank pain 08/25/2022   Diverticulitis of colon 08/01/2022   Erectile dysfunction 07/24/2022   Hypotension 07/24/2022   Right forearm pain 05/14/2022   Constipation 05/06/2022   Chronic pain of both shoulders 11/27/2021   Stage 3b chronic kidney disease (HCC) 02/21/2021   Dizziness 02/21/2021   Schizophrenia (HCC) 04/06/2018   Severe recurrent major depression without psychotic features (HCC) 12/16/2017   Severe bipolar I disorder, most recent episode depressed (HCC)    Hypertriglyceridemia 02/05/2017   Suicidal ideations 12/12/2016   Obesity, Class III, BMI 40-49.9 (morbid obesity) (HCC)    Type 2 diabetes mellitus with complication, with long-term current use of insulin  (HCC) 12/10/2016   Moderate single current episode of major depressive disorder (HCC) 04/15/2016   Shoulder pain 04/14/2016   Diverticulosis of large intestine without hemorrhage 11/07/2015   Calculus of gallbladder without cholecystitis 11/07/2015     PCP: Elicia Hamlet, MD   REFERRING PROVIDER: Orie Milda CROME, MD   REFERRING DIAG: M54.16 (ICD-10-CM) - Lumbar back pain with radiculopathy affecting left lower extremity   Rationale for Evaluation and Treatment: Rehabilitation  THERAPY DIAG:  Other low back pain  Sciatica, left side  Muscle spasm of back  ONSET DATE: Couple of years  SUBJECTIVE:                                                                                                                                                                                           SUBJECTIVE STATEMENT: Low back has been doing well. Some twinges of pain today, but they resolved. Went to gym yesterday for the first time in a month and tolerated it well, but realized he  had gotten a little weaker.  EVAL: Pt reports his L low back and L LE pain are periodic and usually lasts about a day. The last episode lasted 5 days and that's when he sought medical care. These episodes are significant in severity and limit his QOL and ability to function. Pt states l LE pain is not always associated with the L low back pain. Pt notes he works out at [employer] approx 3 days a week  PERTINENT HISTORY:  DM, High BMI  PAIN:  Are you having pain? Yes: NPRS scale: current 0/10.  About 3 weeks ago: 5-7/10 Pain location: L low back and LE pain Pain description: dull ache, but sharp with movement Aggravating factors: Standing too long Relieving factors: gabapentin   PRECAUTIONS: None  RED FLAGS: None   WEIGHT BEARING RESTRICTIONS: No  FALLS:  Has patient fallen in last 6 months? 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  DIAGNOSTIC  FINDINGS:  05/26/24 IMPRESSION: 1. No acute findings. 2. Scoliosis concave to the right at the upper lumbar spine.  PATIENT SURVEYS:  Modified Oswestry: 22%  Score Category Description  0-20% Minimal Disability The patient can cope with most living activities. Usually no treatment is indicated apart from advice on lifting, sitting and exercise  21-40% Moderate Disability The patient experiences more pain and difficulty with sitting, lifting and standing. Travel and social life are more difficult and they may be disabled from work. Personal care, sexual activity and sleeping are not grossly affected, and the patient can usually be managed by conservative means  41-60% Severe Disability Pain remains the main problem in this group, but activities of daily living are affected. These patients require a detailed investigation  61-80% Crippled Back pain impinges on all aspects of the patients life. Positive intervention is required  81-100% Bed-bound These patients are either bed-bound or exaggerating their symptoms  Bluford FORBES Zoe DELENA Karon DELENA, et al. Surgery versus conservative management of stable thoracolumbar fracture: the PRESTO feasibility RCT. [place] ([place]): [employer]; 2021 Nov. [facility] Technology Assessment, No. 25.62.) Appendix 3, Oswestry Disability Index category descriptors. Available from: [email or web address].cz  Minimally Clinically Important Difference (MCID) = 12.8%  COGNITION: Overall cognitive status: Within functional limits for tasks assessed     SENSATION: WFL  MUSCLE LENGTH: Hamstrings: Right 45d deg; Left 45d deg  POSTURE: rounded shoulders  PALPATION: TTP of the L QL and paraspinals with increase muscle tension  LUMBAR ROM:   AROM eval  Flexion Full; Increase in L low back pain  Extension Full; no pain increase  Right lateral flexion Full; increase in L low back pain  Left lateral flexion Full; no pain increase  Right  rotation Full; no pain increase  Left rotation Full; no pain increase   (Blank rows = not tested)  LOWER EXTREMITY ROM:    WFLs Active  Right eval Left eval  Hip flexion    Hip extension    Hip abduction    Hip adduction    Hip internal rotation    Hip external rotation    Knee flexion    Knee extension    Ankle dorsiflexion    Ankle plantarflexion    Ankle inversion    Ankle eversion     (  Blank rows = not tested)  LOWER EXTREMITY MMT:   Myotome screen negative. LE strength good MMT Right eval Left eval  Hip flexion    Hip extension    Hip abduction    Hip adduction    Hip internal rotation    Hip external rotation    Knee flexion    Knee extension    Ankle dorsiflexion    Ankle plantarflexion    Ankle inversion    Ankle eversion     (Blank rows = not tested)  LUMBAR SPECIAL TESTS:  Straight leg raise test: Negative, Slump test: Negative, SI Compression/distraction test: Negative, and FABER test: Negative  FUNCTIONAL TESTS:  NT  GAIT: Distance walked: 200' Assistive device utilized: None Level of assistance: Complete Independence Comments: WNLs  TREATMENT DATE:  [facility] Adult PT Treatment:                                                DATE: 07/21/24 Therapeutic Exercise: Nustep L5 Hooklying Single Knee to Chest  x2 20 Supine Piriformis Stretch x2 20 Supine Lower Trunk Rotation  x5 10 Seated Flexion Stretch with Swiss Ball x5 20 PPT x10 3 AB bracing 90/90 x5 10 H/L clams x15 BluTB Bridging c PPT x10 3 Paloff press x12 BluTB  Therapeutic Exercise: *** Manual Therapy: *** Neuromuscular re-ed: *** Therapeutic Activity: *** Modalities: *** Self Care: ***  [facility] Adult PT Treatment:                                                DATE: 06/21/24 Therapeutic Exercise: Nustep L5 Hooklying Single Knee to Chest  x2 20 Supine Piriformis Stretch x2 20 Supine Lower Trunk Rotation  x5 10 Seated Flexion Stretch with Swiss Ball x5 20 PPT  x10 3 AB bracing 90/90 x5 10 H/L clams x15 BluTB Bridging c PPT x10 3 Paloff press x12 BluTB  [facility] Adult PT Treatment:                                                DATE: 06/15/24 Therapeutic Exercise: Developed, instructed in, and pt completed therex as noted in HEP  Self Care: Eval findings and purpose of PT                                                                                                                                PATIENT EDUCATION:  Education details: Eval findings, POC, HEP, self care  Person educated: Patient Education method: Explanation, Demonstration, Tactile cues, Verbal cues, and Handouts Education  comprehension: verbalized understanding, returned demonstration, verbal cues required, and tactile cues required  HOME EXERCISE PROGRAM: Access Code: [email or web address] URL: [email or web address] Date: 06/21/2024 Prepared by: Dasie Daft  Exercises - Hooklying Single Knee to Chest  - 1 x daily - 7 x weekly - 1 sets - 3 reps - 20 hold - Supine Piriformis Stretch with Foot on Ground  - 1 x daily - 7 x weekly - 1 sets - 3 reps - 20 hold - Supine Lower Trunk Rotation  - 1 x daily - 7 x weekly - 1 sets - 10 reps - 5 hold - Seated Flexion Stretch with Swiss Ball  - 1 x daily - 7 x weekly - 1 sets - 5-10 reps - 5-20 hold - Supine Posterior Pelvic Tilt  - 1 x daily - 7 x weekly - 1 sets - 10 reps - 3 hold - Supine 90/90 Abdominal Bracing  - 1 x daily - 7 x weekly - 2 sets - 5 reps - 10 hold - Hooklying Clamshell with Resistance  - 1 x daily - 7 x weekly - 2-3 sets - 10 reps - 3 hold - Supine Bridge  - 1 x daily - 7 x weekly - 2 sets - 10 reps - 3 hold - Anti-Rotation Sidestepping with Resistance  - 1 x daily - 7 x weekly - 2 sets - 10 reps - 3 hold  ASSESSMENT:  CLINICAL IMPRESSION: PT was completed for lumbopelvic flexibility and strengthening with review of HEP and the initiation of core strengthening. Pt's HEP was updated. Pt tolerated the prescribed  exs without adverse effects. Pt indicates consistent completion of his HEP. Pt will continue to benefit from skilled PT to address impairments for improved back function c minimized pain.   EVAL: Patient is a 49 y.o. male who was seen today for physical therapy evaluation and treatment for M54.16 (ICD-10-CM) - Lumbar back pain with radiculopathy affecting left lower extremity. Pt presents with intermittent L low back and LE pain, scoliosis concave to the right at the upper lumbar spine, increased tenderness and muscle tension of the L QL and lumbar paraspinals, back weakness, tight hamstrings, and L low back pain which was aggravated by trunk forward bending and R SB. Pt's Mod Oswestry indicates a min/mod level perceived functional disability. A HEP to address lumbopelvic flexibility was started. Pt will benefit from skilled PT to address impairments to optimize function with less pain.   OBJECTIVE IMPAIRMENTS: decreased activity tolerance, decreased strength, postural dysfunction, pain, and high BMI.   ACTIVITY LIMITATIONS: lifting, bending, standing, and locomotion level  PARTICIPATION LIMITATIONS: meal prep, cleaning, and laundry  PERSONAL FACTORS: Fitness, Time since onset of injury/illness/exacerbation, and 1-2 comorbidities: high BMI, DM2 are also affecting patient's functional outcome.   REHAB POTENTIAL: Good  CLINICAL DECISION MAKING: Evolving/moderate complexity  EVALUATION COMPLEXITY: Moderate   GOALS:  SHORT TERM GOALS: Target date: 07/09/23  Pt will be Ind in an initial HEP  Baseline:started Goal status: INITIAL  2.  Pt will report a 25% or greater decrease in pain for improved function and QOL Baseline:  Goal status: INITIAL  LONG TERM GOALS: Target date: 08/18/23  Pt will be Ind in a final HEP to maintain achieved LOF  Baseline: started Goal status: INITIAL  2.  Pt will report a 75% or greater decrease in pain for improved function and QOL Baseline:  Goal status:  INITIAL  3.  Increase hamstring flexibility to 60d or greater to minimize low back strain  Baseline:  45d Goal status: INITIAL  4.  Pt will demonstrate proper body mechanics for for lifting and household activities to minimize low back strain  Baseline:  Goal status: INITIAL  5.  Pt's Mod Oswestry score will improve to 10% or better as indication of improved function  Baseline: 22% Goal status: INITIAL  PLAN:  PT FREQUENCY: 1-2x/week  PT DURATION: 8 weeks  PLANNED INTERVENTIONS: 97164- PT Re-evaluation, 97110-Therapeutic exercises, 97530- Therapeutic activity, 97112- Neuromuscular re-education, 97535- Self Care, [id number]- Manual therapy, [id number]- Aquatic Therapy, [id number]- Electrical stimulation (unattended), 20560 (1-2 muscles), 20561 (3+ muscles)- Dry Needling, Patient/Family education, Taping, Cryotherapy, and Moist heat.  PLAN FOR NEXT SESSION: Assess response to HEP; progress therex as indicated; use of modalities, manual therapy; and TPDN as indicated.    Solace Manwarren MS, PT 06/15/2024 5:49 PM "

## 2024-07-21 ENCOUNTER — Ambulatory Visit

## 2024-07-21 DIAGNOSIS — M62830 Muscle spasm of back: Secondary | ICD-10-CM

## 2024-07-21 DIAGNOSIS — M5432 Sciatica, left side: Secondary | ICD-10-CM

## 2024-07-21 DIAGNOSIS — M5459 Other low back pain: Secondary | ICD-10-CM

## 2024-07-28 ENCOUNTER — Ambulatory Visit

## 2024-08-29 ENCOUNTER — Encounter
